# Patient Record
Sex: Male | Born: 1945
Health system: Southern US, Community
[De-identification: ages and names within clinical notes are randomized; demographics above are authoritative.]

## PROBLEM LIST (undated history)

## (undated) DIAGNOSIS — I6529 Occlusion and stenosis of unspecified carotid artery: Secondary | ICD-10-CM

## (undated) DIAGNOSIS — I714 Abdominal aortic aneurysm, without rupture, unspecified: Secondary | ICD-10-CM

## (undated) DIAGNOSIS — I724 Aneurysm of artery of lower extremity: Secondary | ICD-10-CM

## (undated) DIAGNOSIS — I10 Essential (primary) hypertension: Secondary | ICD-10-CM

## (undated) DIAGNOSIS — C801 Malignant (primary) neoplasm, unspecified: Secondary | ICD-10-CM

## (undated) DIAGNOSIS — N189 Chronic kidney disease, unspecified: Secondary | ICD-10-CM

## (undated) DIAGNOSIS — I2510 Atherosclerotic heart disease of native coronary artery without angina pectoris: Secondary | ICD-10-CM

## (undated) DIAGNOSIS — I219 Acute myocardial infarction, unspecified: Secondary | ICD-10-CM

## (undated) DIAGNOSIS — H3320 Serous retinal detachment, unspecified eye: Secondary | ICD-10-CM

## (undated) DIAGNOSIS — E785 Hyperlipidemia, unspecified: Secondary | ICD-10-CM

## (undated) HISTORY — PX: EYE SURGERY: SHX253

## (undated) HISTORY — PX: OTHER SURGICAL HISTORY: SHX169

## (undated) HISTORY — DX: Essential (primary) hypertension: I10

## (undated) HISTORY — DX: Abdominal aortic aneurysm, without rupture, unspecified: I71.40

## (undated) HISTORY — PX: COLONOSCOPY: SHX174

## (undated) HISTORY — DX: Aneurysm of artery of lower extremity: I72.4

## (undated) HISTORY — DX: Hyperlipidemia, unspecified: E78.5

## (undated) HISTORY — DX: Atherosclerotic heart disease of native coronary artery without angina pectoris: I25.10

## (undated) HISTORY — DX: Abdominal aortic aneurysm, without rupture: I71.4

## (undated) HISTORY — PX: CAROTID ENDARTERECTOMY: SUR193

## (undated) HISTORY — DX: Occlusion and stenosis of unspecified carotid artery: I65.29

## (undated) HISTORY — DX: Serous retinal detachment, unspecified eye: H33.20

---

## 2005-09-20 ENCOUNTER — Emergency Department: Payer: Self-pay | Admitting: Emergency Medicine

## 2005-09-20 ENCOUNTER — Other Ambulatory Visit: Payer: Self-pay

## 2005-10-02 ENCOUNTER — Ambulatory Visit: Payer: Self-pay | Admitting: Cardiovascular Disease

## 2005-11-23 ENCOUNTER — Ambulatory Visit: Payer: Self-pay | Admitting: Cardiovascular Disease

## 2005-11-28 ENCOUNTER — Ambulatory Visit: Payer: Self-pay | Admitting: Cardiovascular Disease

## 2006-02-15 ENCOUNTER — Ambulatory Visit: Payer: Self-pay | Admitting: General Surgery

## 2006-11-13 ENCOUNTER — Ambulatory Visit: Payer: Self-pay | Admitting: Cardiovascular Disease

## 2008-01-29 ENCOUNTER — Ambulatory Visit: Payer: Self-pay | Admitting: Cardiovascular Disease

## 2008-07-28 ENCOUNTER — Telehealth: Payer: Self-pay | Admitting: Cardiovascular Disease

## 2009-01-25 ENCOUNTER — Ambulatory Visit: Payer: Self-pay | Admitting: Ophthalmology

## 2009-01-25 ENCOUNTER — Ambulatory Visit: Payer: Self-pay | Admitting: Cardiology

## 2009-01-26 ENCOUNTER — Ambulatory Visit: Payer: Self-pay | Admitting: Ophthalmology

## 2009-02-12 HISTORY — PX: RETINAL DETACHMENT SURGERY: SHX105

## 2009-02-25 ENCOUNTER — Ambulatory Visit: Payer: Medicare Other | Admitting: Ophthalmology

## 2009-07-07 ENCOUNTER — Encounter: Payer: Self-pay | Admitting: Cardiovascular Disease

## 2009-10-11 ENCOUNTER — Ambulatory Visit: Payer: Self-pay | Admitting: Cardiovascular Disease

## 2009-10-11 DIAGNOSIS — R0609 Other forms of dyspnea: Secondary | ICD-10-CM | POA: Insufficient documentation

## 2009-10-11 DIAGNOSIS — I2510 Atherosclerotic heart disease of native coronary artery without angina pectoris: Secondary | ICD-10-CM | POA: Insufficient documentation

## 2009-10-11 DIAGNOSIS — E785 Hyperlipidemia, unspecified: Secondary | ICD-10-CM | POA: Insufficient documentation

## 2009-10-11 DIAGNOSIS — R0989 Other specified symptoms and signs involving the circulatory and respiratory systems: Secondary | ICD-10-CM | POA: Insufficient documentation

## 2009-11-02 ENCOUNTER — Ambulatory Visit: Payer: Self-pay

## 2009-11-02 ENCOUNTER — Ambulatory Visit (HOSPITAL_COMMUNITY)
Admission: RE | Admit: 2009-11-02 | Discharge: 2009-11-02 | Payer: Self-pay | Source: Home / Self Care | Admitting: Cardiovascular Disease

## 2009-11-02 ENCOUNTER — Ambulatory Visit: Payer: Self-pay | Admitting: Cardiology

## 2009-11-02 ENCOUNTER — Encounter: Payer: Self-pay | Admitting: Cardiovascular Disease

## 2009-11-04 ENCOUNTER — Encounter: Admission: RE | Admit: 2009-11-04 | Discharge: 2009-11-04 | Payer: Self-pay | Admitting: Surgery

## 2009-11-04 ENCOUNTER — Ambulatory Visit: Payer: Self-pay | Admitting: Surgery

## 2009-11-04 ENCOUNTER — Encounter: Payer: Self-pay | Admitting: Cardiovascular Disease

## 2009-11-07 ENCOUNTER — Ambulatory Visit: Payer: Self-pay | Admitting: Surgery

## 2009-11-07 ENCOUNTER — Encounter: Payer: Self-pay | Admitting: Cardiovascular Disease

## 2009-11-12 HISTORY — PX: POPLITEAL ARTERY STENT: SHX2243

## 2009-11-15 ENCOUNTER — Telehealth: Payer: Self-pay | Admitting: Cardiovascular Disease

## 2009-11-22 ENCOUNTER — Ambulatory Visit (HOSPITAL_COMMUNITY)
Admission: RE | Admit: 2009-11-22 | Discharge: 2009-11-22 | Payer: Self-pay | Source: Home / Self Care | Admitting: Surgery

## 2009-11-22 ENCOUNTER — Ambulatory Visit: Payer: Self-pay | Admitting: Surgery

## 2009-11-24 ENCOUNTER — Inpatient Hospital Stay (HOSPITAL_COMMUNITY)
Admission: RE | Admit: 2009-11-24 | Discharge: 2009-11-25 | Payer: Self-pay | Source: Home / Self Care | Admitting: Surgery

## 2009-11-24 HISTORY — PX: ABDOMINAL AORTIC ANEURYSM REPAIR: SUR1152

## 2009-12-19 ENCOUNTER — Encounter: Admission: RE | Admit: 2009-12-19 | Discharge: 2009-12-19 | Payer: Self-pay | Admitting: Surgery

## 2009-12-19 ENCOUNTER — Encounter: Payer: Self-pay | Admitting: Cardiovascular Disease

## 2009-12-19 ENCOUNTER — Ambulatory Visit: Payer: Self-pay | Admitting: Surgery

## 2010-03-04 ENCOUNTER — Other Ambulatory Visit: Payer: Self-pay | Admitting: Surgery

## 2010-03-04 DIAGNOSIS — I714 Abdominal aortic aneurysm, without rupture, unspecified: Secondary | ICD-10-CM

## 2010-03-14 NOTE — Progress Notes (Signed)
Summary: echo results  Phone Note Call from Patient Call back at Home Phone [phone]   Caller: Patient Summary of Call: Pt returning call Initial call taken by: Judie Grieve,  November 15, 2009 9:53 AM  Follow-up for Phone Call        adv pt of echo results. Follow-up by: Claris Gladden RN,  November 15, 2009 10:04 AM

## 2010-03-14 NOTE — Letter (Signed)
Summary: [facility] Visit   [facility] Visit   Imported By: Roderic Ovens 11/28/2009 17:19:09  _____________________________________________________________________  External Attachment:    Type:   Image     Comment:   External Document

## 2010-03-14 NOTE — Assessment & Plan Note (Signed)
Summary: follow up   Visit Type:  Follow-up  CC:  Sob.  History of Present Illness:  Mr. [name] [name] is a 65 year old gentleman with coronary artery disease, who had an inferior MI in August 2007.  He was treated with bare-metal stenting of the right coronary artery at [facility].  He had no other obstructive CAD at that time.  He has done well since his initial event.    About 2-3 weeks ago he experienced dyspnea with walking that lasted about 2 days.  At that time he denies chest pain, dyspnea, edema, orthopnea, PND, palpitations, or lightheadedness. He denies dyspnea since that one episode.  He also denies chest pain, dyspnea, edema, orthopnea, PND, palpitations, or lightheadedness.  Exercise: walking, 1 mile daily, 7x/week Tobacco: quit >30 yrs, smoked 1 ppdx 15 yrs Weight: stable   New Med dose: Simvastatin 80mg  for about 1 yr.  Prior to that he was taking Simva 40, and Lipitor prior to that.    He has had elevated Triglycerides in the 1000s prior to taking Lovaza.  Trig is now 164.      Current Medications (verified): 1)  Simvastatin 80 Mg Tabs (Simvastatin) .... Take One Tablet By Mouth Daily At Bedtime 2)  Lisinopril 20 Mg Tabs (Lisinopril) .... Take One Tablet By Mouth Daily 3)  Lovaza 1 Gm Caps (Omega-3-Acid Ethyl Esters) .... Take 1 Capsule By Mouth Once A Day 4)  Metoprolol Tartrate 25 Mg Tabs (Metoprolol Tartrate) .... Take One Tablet By Mouth Twice A Day 5)  Niacin 500 Mg Tabs (Niacin) .... Take 2 Tablets Two Times A Day 6)  Saw Palmetto 450 Mg Caps (Saw Palmetto (Serenoa Repens)) .... Take 2 Caps Two Times A Day 7)  Aspirin 81 Mg Tbec (Aspirin) .... Take One Tablet By Mouth Daily  Allergies (verified): 1)  ! Terramycin  Past History:  Past medical history reviewed for relevance to current acute and chronic problems.  Past Medical History: 1. Coronary artery disease s/p inferior MI 2007 2. Hypertension 3. Hyperlipidemia  Review of Systems       Negative  except as per HPI   Vital Signs:  Patient profile:   65 year old male Height:      74 inches Weight:      207.75 pounds BMI:     26.77 Pulse rate:   65 / minute Pulse rhythm:   regular Resp:     18 per minute BP sitting:   100 / 70  (left arm) Cuff size:   large  Vitals Entered By: Vikki Ports (October 11, 2009 9:58 AM)  Physical Exam  General:  Pt is alert and oriented, in no acute distress. HEENT: normal Neck: normal carotid upstrokes without bruits, JVP normal Lungs: CTA CV: RRR without murmur or gallop Abd: soft, NT, positive BS, no bruit, no organomegaly Ext: no clubbing, cyanosis, or edema. peripheral pulses 2+ and equal Skin: warm and dry without rash    EKG  Procedure date:  10/11/2009  Findings:      NSR 66 bpm with age-indeterminate inferior infarct.  Impression & Recommendations:  Problem # 1:  CORONARY ATHEROSCLEROSIS NATIVE CORONARY ARTERY (ICD-414.01) Pt is stable without angina. He is on a good secondary risk-reduction program. With recent dyspnea on exertion, recommend evaluate with an exercise stress echo to rule out significant ischemia. Otherwise reviewed details of an exercise program and importance of continued medication adherence.  His updated medication list for this problem includes:    Lisinopril 20 Mg Tabs (  Lisinopril) .Marland Kitchen... Take one tablet by mouth daily    Metoprolol Tartrate 25 Mg Tabs (Metoprolol tartrate) .Marland Kitchen... Take one tablet by mouth twice a day    Aspirin 81 Mg Tbec (Aspirin) .Marland Kitchen... Take one tablet by mouth daily  Orders: EKG w/ Interpretation (93000) Abdominal Aorta Duplex (Abd Aorta Duplex) Stress Echo (Stress Echo)  Problem # 2:  ABDOMINAL BRUIT (ICD-785.9) In patient with athersclerotic CAD, recommend aortic duplex ultrasound to rule out AAA.  Orders: EKG w/ Interpretation (93000) Abdominal Aorta Duplex (Abd Aorta Duplex) Stress Echo (Stress Echo)  Problem # 3:  HYPERLIPIDEMIA-MIXED (ICD-272.4) Lipids reviewed: Chol  120, Trig 164, HDL 28, LDL 59.  HDL has not improved on Niacin. Pt will reduce his niacin dose by 50%.  His updated medication list for this problem includes:    Simvastatin 80 Mg Tabs (Simvastatin) .Marland Kitchen... Take one tablet by mouth daily at bedtime    Lovaza 1 Gm Caps (Omega-3-acid ethyl esters) .Marland Kitchen... Take 1 capsule by mouth once a day    Niacin 500 Mg Tabs (Niacin) .Marland Kitchen... Take one tablet by mouth two times a day  Patient Instructions: 1)  Your physician has recommended you make the following change in your medication: DECREASE Niacin to one tablet by mouth two times a day  2)  Your physician wants you to follow-up in: 1 YEAR.  You will receive a reminder letter in the mail two months in advance. If you don't receive a letter, please call our office to schedule the follow-up appointment. 3)  Your physician has requested that you have an abdominal aorta duplex. During this test, an ultrasound is used to evaluate the aorta. Allow 30 minutes for this exam. Do not eat after midnight the day before and avoid carbonated beverages. There are no restrictions or special instructions. 4)  Your physician has requested that you have a stress echocardiogram. For further information please visit [email or web address]  Please follow instruction sheet as given.

## 2010-03-14 NOTE — Progress Notes (Signed)
Summary: [facility] Office Visit  [facility] Office Visit   Imported By: Earl Many 01/03/2010 16:07:09  _____________________________________________________________________  External Attachment:    Type:   Image     Comment:   External Document

## 2010-03-27 ENCOUNTER — Ambulatory Visit
Admission: RE | Admit: 2010-03-27 | Discharge: 2010-03-27 | Disposition: A | Discharge status: Home / Self Care | Payer: Medicare Other | Source: Ambulatory Visit | Attending: Surgery | Admitting: Surgery

## 2010-03-27 ENCOUNTER — Ambulatory Visit (INDEPENDENT_AMBULATORY_CARE_PROVIDER_SITE_OTHER): Payer: Medicare Other | Admitting: Surgery

## 2010-03-27 DIAGNOSIS — I714 Abdominal aortic aneurysm, without rupture: Secondary | ICD-10-CM

## 2010-03-27 MED ORDER — IOHEXOL 350 MG/ML SOLN: 165.0000 mL | Freq: Once | INTRAVENOUS | Status: AC | PRN

## 2010-03-28 NOTE — Assessment & Plan Note (Signed)
OFFICE VISIT  YING, ROCKS DOB:  Jun 19, 1945                                       03/27/2010 [id number]#:[id number]  Patient comes back in today for follow-up.  He is status post endovascular repair of left popliteal aneurysm on 11/23/2009 as well as percutaneous endovascular repair of an abdominal aortic aneurysm on 10/13.  He has had no complications.  He does have a known type 2 endoleak.  He in comes today without complaints.  PHYSICAL EXAMINATION:  Heart rate 62, blood pressure 123/69, O2 sats 97%, respiratory rate 12.  General:  He is well-appearing, no distress. Abdomen:  Soft, nontender.  No pulsatile mass.  Respirations nonlabored. Extremities are warm and well-perfused.  Pedal pulses are intact.  DIAGNOSTIC STUDIES:  CT angiogram was performed today.  It shows no change in his aneurysm sac, but persistent type 2 endoleak from a patent lumbar.  Left popliteal stent is widely patent with mild in-stent stenosis.  ASSESSMENT:  Status post endovascular aneurysm and left popliteal aneurysm repair.  PLAN:  We discussed at length the significance of the type 2 endoleak. I have scheduled him to come back to see me in 6 months.  He knows that he is still at risk for abdominal aneurysm rupture, although I think this is unlikely.  We will make recommendations for intervention if the aneurysm increases in size.  Hopefully this will resolve on its own.  He will get a CT scan in 6 months as well as an ultrasound evaluation of his legs.    Jorge Ny, MD Electronically Signed  VWB/MEDQ  D:  03/27/2010  T:  03/28/2010  Job:  [id number]  cc:   Veverly Fells. Excell Seltzer, MD

## 2010-04-27 LAB — PROTIME-INR
INR: 1.08 (ref 0.00–1.49)
INR: 1.22 (ref 0.00–1.49)
Prothrombin Time: 14.2 seconds (ref 11.6–15.2)

## 2010-04-27 LAB — COMPREHENSIVE METABOLIC PANEL
AST: 17 U/L (ref 0–37)
Albumin: 3.3 g/dL — ABNORMAL LOW (ref 3.5–5.2)
BUN: 11 mg/dL (ref 6–23)
Calcium: 8.9 mg/dL (ref 8.4–10.5)
Calcium: 9.1 mg/dL (ref 8.4–10.5)
Creatinine, Ser: 1.09 mg/dL (ref 0.4–1.5)
GFR calc Af Amer: >60 mL/min (ref >60)
Glucose, Bld: 129 mg/dL — ABNORMAL HIGH (ref 70–99)
Sodium: 138 mEq/L (ref 135–145)
Total Protein: 6.2 g/dL (ref 6.0–8.3)
Total Protein: 6.2 g/dL (ref 6.0–8.3)

## 2010-04-27 LAB — CBC
HCT: 36.4 % — ABNORMAL LOW (ref 39.0–52.0)
HCT: 41.4 % (ref 39.0–52.0)
Hemoglobin: 12.4 g/dL — ABNORMAL LOW (ref 13.0–17.0)
MCH: 31.1 pg (ref 26.0–34.0)
MCH: 31.7 pg (ref 26.0–34.0)
MCHC: 33.9 g/dL (ref 30.0–36.0)
MCHC: 34.1 g/dL (ref 30.0–36.0)
MCHC: 34.3 g/dL (ref 30.0–36.0)
MCV: 92.4 fL (ref 78.0–100.0)
Platelets: 179 10*3/uL (ref 150–400)
Platelets: 196 10*3/uL (ref 150–400)
RBC: 4.04 MIL/uL — ABNORMAL LOW (ref 4.22–5.81)
RDW: 12.4 % (ref 11.5–15.5)
RDW: 12.5 % (ref 11.5–15.5)

## 2010-04-27 LAB — POCT I-STAT, CHEM 8
BUN: 13 mg/dL (ref 6–23)
Calcium, Ion: 1.2 mmol/L (ref 1.12–1.32)
Hemoglobin: 15 g/dL (ref 13.0–17.0)
Sodium: 143 mEq/L (ref 135–145)
TCO2: 23 mmol/L (ref 0–100)

## 2010-04-27 LAB — BLOOD GAS, ARTERIAL
Bicarbonate: 22.7 mEq/L (ref 20.0–24.0)
Drawn by: 206361
Drawn by: 22251
Patient temperature: 98.6
Patient temperature: 98.6
TCO2: 24.5 mmol/L (ref 0–100)
pH, Arterial: 7.342 — ABNORMAL LOW (ref 7.350–7.450)
pH, Arterial: 7.401 (ref 7.350–7.450)

## 2010-04-27 LAB — TYPE AND SCREEN
ABO/RH(D): O NEG
Antibody Screen: NEGATIVE

## 2010-04-27 LAB — URINALYSIS, ROUTINE W REFLEX MICROSCOPIC
Bilirubin Urine: NEGATIVE
Glucose, UA: NEGATIVE mg/dL
Hgb urine dipstick: NEGATIVE
Specific Gravity, Urine: 1.015 (ref 1.005–1.030)
pH: 6 (ref 5.0–8.0)

## 2010-04-27 LAB — BASIC METABOLIC PANEL
CO2: 25 mEq/L (ref 19–32)
GFR calc non Af Amer: >60 mL/min (ref >60)
Glucose, Bld: 101 mg/dL — ABNORMAL HIGH (ref 70–99)
Potassium: 3.9 mEq/L (ref 3.5–5.1)
Sodium: 141 mEq/L (ref 135–145)

## 2010-06-26 ENCOUNTER — Ambulatory Visit: Payer: Medicare Other | Admitting: Surgery

## 2010-06-26 ENCOUNTER — Ambulatory Visit: Payer: Self-pay | Admitting: Surgery

## 2010-06-27 NOTE — Procedures (Signed)
CAROTID DUPLEX EXAM   INDICATION:  The patient is referred for evaluation prior to planned  surgery for 6.5 cm abdominal aortic aneurysm found on recent ultrasound.   HISTORY:  Diabetes:  No.  Cardiac:  Stent in 2007.  Hypertension:  Yes.  Smoking:  No.  Previous Surgery:  No.  CV History:  Amaurosis Fugax No, Paresthesias No, Hemiparesis No                                       RIGHT             LEFT  Brachial systolic pressure:  Brachial Doppler waveforms:  Vertebral direction of flow:        Antegrade         Antegrade  DUPLEX VELOCITIES (cm/sec)  CCA peak systolic                   121               121  ECA peak systolic                   86                91  ICA peak systolic                   76                89  ICA end diastolic                   31                47  PLAQUE MORPHOLOGY:                  Homogeneous       Homogeneous  PLAQUE AMOUNT:                      Minimal           Mild  PLAQUE LOCATION:                    Bifurcation       Bifurcation   IMPRESSION:  No evidence of hemodynamically significant internal carotid  artery stenosis bilaterally, <40%.    ___________________________________________  V. Charlena Cross, MD   RS/MEDQ  D:  11/04/2009  T:  11/05/2009  Job:  [id number]

## 2010-06-27 NOTE — Assessment & Plan Note (Signed)
OFFICE VISIT   ZAKAREE, MCCLENAHAN E  DOB:  [birth year]                                       11/07/2009  EAVWU#:[id number]   REASON FOR VISIT:  Abdominal and popliteal aneurysm.   HISTORY:  The patient is a 65 year old gentleman that I am seeing at the  request of Dr. Excell Seltzer for aneurysms within his abdominal aorta and left  and right popliteal arteries.  These were just discovered within the  last week or 2 by Dr. Excell Seltzer and was referred to me for further  evaluation.  The patient does have a history of coronary artery disease.  He had a heart attack in 2007 where he underwent bare-metal stenting at  [facility].  He is medically managed for his hypertension and  hypercholesterolemia.   The patient denies having any abdominal pain or claudication-like  symptoms.  He denies chest pain or shortness of breath.   REVIEW OF SYSTEMS:  Negative except as detailed in the HPI.   PAST MEDICAL HISTORY:  Hypertension, hypercholesterolemia, coronary  artery disease, detached retina.   PAST SURGICAL HISTORY:  Coronary stenting in 2007.   SOCIAL HISTORY:  He has a history of smoking but quit 25 years ago.  Does not drink.   FAMILY HISTORY:  Positive for peripheral vascular disease in his brother  and possible ruptured aneurysm in his sister.   MEDICATIONS:  Simvastatin, lisinopril, Lovaza, metoprolol, niacin, saw  palmetto oil and aspirin.   ALLERGIES:  Terramycin.   PHYSICAL EXAMINATION:  Heart rate 63, blood pressure 93/59, O2  saturation is 99%.  Generally, he is well-appearing, in no distress.  HEENT:  Within normal limits.  Lungs are clear bilaterally.  No wheezes  or rhonchi.  Cardiovascular:  Regular rate and rhythm.  No murmur.  No  carotid bruits.  Pedal pulses are not palpable.  He has palpable  popliteal pulse and bulge bilaterally, left greater than right.  Neurologically he is intact.  Skin is without rash.   DIAGNOSTIC STUDIES:  I have  ordered and reviewed his carotid ultrasound,  which showed minimal carotid stenosis bilaterally.  I have also reviewed  his CT angiogram, which shows a 2.2-cm left popliteal aneurysm and a 6.5-  cm infrarenal abdominal aortic aneurysm and a 1.5-cm right popliteal  aneurysm.   ASSESSMENT/PLAN:  I spent approximately 45 minutes discussing findings  with the patient.  I have recommended repair.  We discussed open versus  endovascular repair and we have elected to proceed with a minimally-  invasive approach.  I discussed that treating his popliteal aneurysm  endovascularly would be best done prior to his abdominal procedure.  Therefore, we will plan on Tuesday, October 11, proceeding with  abdominal aortogram with bilateral runoff, evaluation of his right  popliteal aneurysm and treatment of his left popliteal aneurysm  __________ with a __________ endoprosthesis.  The patient understands  that based on the radiographic findings he may not be a candidate for  endovascular repair but based on CT findings, he will be.   We discussed proceeding with endovascular repair of his aneurysm on  October 13.  We discussed all potential complications including risk of  bleeding, renal complications, lower extremity complications, intestinal  complications.  All of the patient's questions were answered.  We are  delaying his operation for 2 weeks because he is supposed to  have laser  surgery on October 6.     Jorge Ny, MD  Electronically Signed   VWB/MEDQ  D:  11/07/2009  T:  11/08/2009  Job:  [id number]   cc:   Veverly Fells. Excell Seltzer, MD

## 2010-06-27 NOTE — Assessment & Plan Note (Signed)
[name]                            CARDIOLOGY OFFICE NOTE   NAME:[name], [name]                   MRN:          [id number]  DATE:01/29/2008                            DOB:          29-Jan-1946    REASON FOR VISIT:  Followup CAD.   HISTORY OF PRESENT ILLNESS:  [name] [name] is a 65 year old gentleman  with coronary artery disease, who had an inferior MI in August 2007.  He  was treated with bare-metal stenting of the right coronary artery at  [name].  He had no other obstructive CAD at that time.  He has  done well since his initial event.  He has previously been more active,  but has been doing less exercise recently.  He does some walking and has  no symptoms with that level of activity.  He specifically denies chest  pain, dyspnea, edema, orthopnea, PND, palpitations, or lightheadedness.  He is changed from long-acting Niaspan to no-flush niacin and has had no  problems with that medicine change.  His other medications are stable.   MEDICATIONS:  1. Toprol-XL 100 mg daily.  2. Lipitor 40 mg daily.  3. Lisinopril 20 mg daily.  4. Aspirin 81 mg daily.  5. Lovaza 1 g daily.  6. Omega-3 four daily.  7. Niacin 500 mg 2 b.i.d.   ALLERGIES:  NKDA.   PHYSICAL EXAMINATION:  GENERAL:  The patient is alert and oriented.  No  acute distress.  VITAL SIGNS:  Weight is 220 pounds, blood pressure 124/76, heart rate  64, and respiratory rate 12.  HEENT:  Normal.  NECK:  Normal carotid upstrokes.  No bruits.  JVP normal.  LUNGS:  Clear bilaterally.  HEART:  Regular rate and rhythm.  No murmurs or gallops.  ABDOMEN:  Soft and nontender.  No organomegaly.  No bruits.  EXTREMITIES:  No clubbing, cyanosis, or edema.  Peripheral pulses intact  and equal.   EKG was done at [name], dated on October 09, 2007.  This showed marked sinus bradycardia with age indeterminate inferior MI.  Otherwise, within normal limits.  The heart  rate at that time was 48  beats per minute.   ASSESSMENT:  1. Coronary artery disease, status post inferior myocardial      infarction.  [name] [name] is doing well with no symptoms.  I      encouraged him to try to increase his exercise program with a goal      toward 30-40 minutes daily.  He, otherwise, will continue on his      current medicines without changes.  He is having no angina.  2. Hypertension.  Blood pressure under good control on his current      regimen.  His heart rate is improved today and he has no symptoms      of dizziness or lightheadedness.  He can continue on his current      beta-blocker dose.  3. Dyslipidemia.  He was managed by [name].  He has treated      aggressively on combination therapy with Lipitor, Lovaza, and  niacin.  Goal LDL is less than 70.   For followup, I would like to see [name] [name] back in 1 year.  I will  be happy to see him sooner if any problems arise.     Veverly Fells. Excell Seltzer, MD  Electronically Signed    MDC/MedQ  DD: 01/30/2008  DT: 01/30/2008  Job #: [id number]   cc:   [name] Kern, PA

## 2010-06-27 NOTE — Assessment & Plan Note (Signed)
[facility]                            CARDIOLOGY OFFICE NOTE   NAME:[name] [name], [name] [name]                   MRN:          [id number]  DATE:11/13/2006                            DOB:          [birth year]    [name] [name] was seen in followup at the [facility] cardiology office  on November 13, 2006.  He is a 65 year old gentleman who had an inferior  MI in August of 2007.  He was treated with percutaneous coronary  intervention and a bare metal stent was placed in his right coronary  artery at that time.  He did not have any other significant obstructive  CAD.  Mr. Vantrease has done well since his event.  He is walking one  mile daily and has no symptoms at that level of activity.  He is not  involved in any vigorous exercise.  He specifically denies chest pain,  dyspnea, orthopnea, PND, edema or palpitations.   He is tolerating his medications well.  He has been on Niaspan at a dose  of 500 mg and is having minimal flushing.   CURRENT MEDICATIONS:  1. Plavix 75 mg daily  2. Toprol XL 100 mg daily  3. Lipitor 40 mg daily  4. Lisinopril 20 mg daily  5. Aspirin 81 mg daily  6. Niaspan 500 mg daily  7. Lovaza 1 gram daily  8. Omega 3 four daily.   ALLERGIES:  NO KNOWN DRUG ALLERGIES.   PHYSICAL EXAMINATION:  On exam, he is alert and oriented, in no acute  distress.  Blood pressure is 104/66, heart rate 56, respiratory rate 12,  weight is 218 pounds.  HEENT:  Normal.  NECK:  Normal carotid upstrokes without bruits.  Jugular venous pressure  is normal.  LUNGS:  Clear to auscultation bilaterally.  HEART:  Regular rate and rhythm without murmurs or gallops.  ABDOMEN:  Soft, nontender, no organomegaly, no abdominal bruits.  EXTREMITIES:  No cyanosis, clubbing or edema.  Peripheral pulses 2+ and  equal throughout.   EKG shows sinus bradycardia with a ventricular rate of 56 beats per  minute.  There is left axis deviation and an age-indeterminate  inferior  MI pattern.   Lipids drawn October 24, 2006 showed a cholesterol of 117, with an HDL  of 26, triglycerides  301, and an LDL of 31.  His glucose was 120 and  creatinine was 1.1.   ASSESSMENT:  1. Coronary artery disease status post inferior MI.  He had preserved      LVEF at the time of 58%.  He did not have any other significant CAD      noted.  Mr. Adcock is tolerating secondary risk reduction      medications well.  I advised that he could discontinue Plavix as he      had a bare metal stent placed for treatment and he is greater than      1 year out from his acute event.  Otherwise I did not make any      changes to his medical regimen today, except an increase in Niaspan      (  see discussion below).  I encouraged him to continue with his      regular walking activity and to try to increase this as tolerated.  2. Dyslipidemia.  He has a lipid profile consistent with metabolic      syndrome.  He has high triglycerides, low HDL and impaired fasting      glucose.  We discussed a carbohydrate restricted diet.  I would      like him to increase his Niaspan from 500 to 1000 mg daily if he      tolerates.  He takes his Niaspan at night and I advised him to take      his aspirin 30 minutes prior to Niaspan to try to minimize      flushing.  He should continue on Lipitor 40 mg.  Unfortunately, Mr.      Benney has been laid off from work and medication cost may      become an issue.  If necessary he could be changed to Simvastatin      for more reasonable cost.  3. Followup.  I would like to see Mr. Hynes back in 6 months or      sooner if any new problems arise.     Veverly Fells. Excell Seltzer, MD  Electronically Signed    MDC/MedQ  DD: 11/13/2006  DT: 11/13/2006  Job #: [id number]   cc:   Practice, [place], [place] [address] Anola Gurney, [facility]

## 2010-06-27 NOTE — Assessment & Plan Note (Signed)
OFFICE VISIT   JHAN, CONERY E  DOB:  [birth year]                                       12/19/2009  ZOXWR#:[id number]   REASON FOR VISIT:  Followup.   The patient comes back in today for followup.  He is status post  endovascular repair of left popliteal aneurysm on 11/23/2009 and  endovascular repair percutaneously of abdominal aortic aneurysm on  10/13.  He has had no complaints.  He does have a small rash which has  been attributed to his contrast.  He has been doing well at home.   PHYSICAL EXAMINATION:  He is afebrile.  His abdomen is soft.  Incision  sites are well-healed.  Legs are warm and well-perfused.   DIAGNOSTIC STUDIES:  Ultrasound was reviewed today.  He has ABI of 1.0  on the left and 0.87 on the right.   I have reviewed his CT scan.  He has a small type 2 endo leak from a  patent lumbar artery with no significant change in his aneurysm sac.   ASSESSMENT AND PLAN:  1. Popliteal aneurysm:  The left side has been successfully excluded      with a Viabahn endoprosthesis.  The right side measured 1.6 cm by      CT scan.  I will continue to follow this with serial imaging      studies.  2. Abdominal aortic aneurysm:  Based on this type 2 endo leak I will      repeat a CT scan in 3 months.  I discussed with the patient that      should his aneurysm sac start to enlarge this would need to be      addressed.  However, more than likely this will spontaneously      thrombose.   I have scheduled the patient to have a CT angiogram with bilateral lower  extremity runoff to evaluate his aneurysm and type 2 endo leak as well  as his popliteal aneurysms.  He will need to be pretreated for his  contrast allergy.     Jorge Ny, MD  Electronically Signed   VWB/MEDQ  D:  12/19/2009  T:  12/19/2009  Job:  [id number]   cc:   Veverly Fells. Excell Seltzer, MD

## 2010-06-30 NOTE — Assessment & Plan Note (Signed)
[facility]                              CARDIOLOGY OFFICE NOTE   NAME:[name], [name] BIALY                   MRN:          [id number]  DATE:11/28/2005                            DOB:          September 25, 1945    [name] [name] was seen this morning at the [facility] to  follow up his coronary artery disease and acute MI that occurred in August  of this year.  He was treated with stenting of an occluded right coronary  artery at that time.  He had no other obstructive disease seen at the time  of his catheterization.  He had a preserved left ventricular ejection  fraction, which was estimated at 58%.   [name] [name] continues to do well.  He is asymptomatic.  He is back to work  and feels to be back at his baseline health.  He specifically denies chest  pain, dyspnea, orthopnea, PND, edema, lightheadedness, syncope, or  palpitations.  He is tolerating his medications well with the only interim  change being the addition of fish oil approximately one week ago.   CURRENT MEDICATIONS:  1. Plavix 75 mg daily.  2. Toprol XL 100 mg daily.  3. Lipitor 40 mg daily.  4. Lisinopril 20 mg daily.  5. Aspirin 81 mg daily.  6. Fish oil, uncertain dose.   PHYSICAL EXAMINATION:  GENERAL:  Patient is alert and oriented in no acute  distress.  VITAL SIGNS:  Weight 217 pounds.  Blood pressure 118/66, heart rate 53,  respiratory rate 12.  NECK:  Normal carotid upstrokes without bruits.  Jugular venous pressure is  normal.  LUNGS:  Clear to auscultation bilaterally.  CARDIOVASCULAR:  Bradycardic and regular.  No murmurs or gallops.  ABDOMEN:  Soft and nontender.  No organomegaly.  EXTREMITIES:  No clubbing, cyanosis or edema.  Peripheral pulses are 2+ and  equal throughout.   Recent lipid panel from October 12th shows a total cholesterol of 102,  triglycerides 143, HDL 22, and LDL of 52.  His LFTs were normal.   [name] [name] is currently stable  from a cardiovascular standpoint in regard  to his coronary artery disease and recent inferior wall MI.  His current  cardiac issues are as follows:  1)  Coronary artery disease:  He will  continue on dual antiplatelet therapy with aspirin and Plavix for one year.  He had a bare metal stent in his right coronary artery but after the acute  MI, I would prefer that he take one year of Plavix.  He should be on aspirin  lifelong.  He has had no angina on his current regimen of Toprol and  lisinopril.  2)  Hypertension:  Blood pressure is well controlled on current  medications.  3)  Dyslipidemia:  In comparing his lipid panel to his  previous panel from September 24th, his LDL cholesterol is excellent.  His  triglycerides are much improved as well; however, his HDL is quite low.  I  have asked that he add Niaspan 500 mg daily to his medical regimen.  He will  be on triple therapy  with Lipitor, fish oil, and Niaspan.  He was warned  about flushing, and I advised him to take his aspirin dose 30 minutes prior  to his Niaspan.  I have asked him to take 500 mg daily x2 weeks, then  increase to 1 gm daily thereafter.  We will plan on rechecking his lipids  and LFTs in three months, which he prefers to do at the [facility] office.  I will plan on seeing him back in one year for followup or  sooner if any new issues arise.       Veverly Fells. Excell Seltzer, MD     MDC/MedQ  DD:  11/28/2005  DT:  11/29/2005  Job #:  [id number]   cc:   Toni Arthurs PA

## 2010-06-30 NOTE — Assessment & Plan Note (Signed)
[facility]                              CARDIOLOGY OFFICE NOTE   NAME:[name] [name], [name] [name]                   MRN:          [id number]  DATE:10/02/2005                            DOB:          02/16/1945    PATIENT IDENTIFICATION:  [name] [name] is a 65 year old man who is married  with 1 child.  He lives in [place].   CHIEF COMPLAINT:  The patient is here for followup of recent acute MI.   HISTORY OF PRESENT ILLNESS:  [name] [name] is a very pleasant 65 year old  man who presents after recent acute inferolateral myocardial infarction.  He  reports that he was in his usual state of health on September 19, 2005, when he  was working on his car.  During that activity, he developed acute onset of  nausea and substernal chest discomfort.  He went inside his home to rest and  his symptoms persisted throughout the remainder of the night.  The next  morning, he continued to have some stuttering chest pain and he called his  family practitioner at that time.  He was advised to call EMS, which he did.  He was taken to [facility] where he was initially  treated with medications since he was pain free and presented very late into  his infarct.  He was taken to the cardiac catheterization lab electively on  September 21, 2005, and was found to have an occluded right coronary artery  which was intervened upon and a bare metal stent was placed in this vessel  with what appears to be a good angiographic result based on pictures that  the patient brings in with him today.   [name] [name] had been fairly active prior to this episode as he had  recently helped his nephew move and did some other heavy work.  He was  asymptomatic during those activities and had really not had any chest  discomfort prior to his myocardial infarction.  Since hospital discharge, he  has also done well.  He specifically denies chest pain, dyspnea, orthopnea,  PND,  lightheadedness, syncope, palpitations, claudication symptoms, or other  cardiovascular complaints.  He has been compliant with his medications and  has not had any adverse effects up to this point.  He does not exercise  regularly, and he has been only sporadically active over the years.  It has  been recommended that he enroll in cardiac rehab but he has not enrolled up  to this point.   MEDICATIONS:  1. Plavix 75 mg daily.  2. Toprol XL 100 mg daily.  3. Atorvastatin 40 mg daily.  4. Lisinopril 20 mg daily.  5. Aspirin 81 mg daily.   ALLERGIES:  TERRAMYCIN.   SOCIAL HISTORY:  The patient lives with his wife in [place].  His grown  son also lives with them periodically.  He works for a [employer], where they work on [employer] that go into [employer].  He reports that his job is not particularly physically demanding.   HABITS:  He does not smoke cigarettes and only rarely  consumes alcohol.  He  does not use any recreational drugs.  He does drink a moderate amount of  caffeine.   FAMILY HISTORY:  The patient's mother died at 69 from complications of  diabetes.  His father died at age 38 in a tractor accident.  He has a sister  who is alive and well and has another sister who is deceased from colon  cancer.  He has 7 brothers, only one of whom has coronary artery disease.   REVIEW OF SYSTEMS:  A complete 12-point review of systems was performed and  is negative except as described in the history of present illness.  The only  other pertinent positive is that he has urinary hesitancy which is improved  after starting Saw palmetto.  He specifically denies GI symptoms, any other  GU symptoms, bleeding problems, neurologic problems, and again all other  systems were reviewed and negative.   PHYSICAL EXAMINATION:  GENERAL:  He is alert and oriented.  He is in no  acute distress.  He appears well nourished.  VITAL SIGNS:  His weight is 214 pounds.  Height  is 6 feet 2 inches.  Blood  pressure is 118/82 in the right arm, 124/86 on the left arm.  Heart rate is  60.  Respiratory rate is 12.  EYES:  Sclerae are anicteric.  Conjunctivae pink.  ENT:  Reveals moist oral mucosa.  The oropharynx is clear.  There is no  cervical lymphadenopathy.  CARDIOVASCULAR:  The apex is discrete and not displaced.  There is no right  ventricular heave or lift.  Heart is regular rate and rhythm without murmurs  or gallops.  LUNGS:  Clear to auscultation bilaterally.  ABDOMEN:  Soft, nontender.  No organomegaly.  There are no abdominal bruits  present.  Bowel sounds are present.  EXTREMITIES:  The right groin was examined and there is faint ecchymosis  present.  The puncture site appears to be healing well.  There is no  erythema or mass present.  The left femoral pulse is 2+ without a bruit.  There is no clubbing, cyanosis, or edema present in the legs.  Peripheral  pulses are 2+ and equal throughout.  SKIN:  Warm and dry.  There is a faint macular rash on the right lower leg.   DATA REVIEW:  A 12-lead EKG, performed in the office, demonstrates normal  sinus rhythm with a inferolateral infarct pattern that appears recent with  deep T wave inversions still present in the inferior leads.  There is some  laboratory data present from [facility] that demonstrates a hemoglobin of 16, a  platelet count of 224,000, creatinine of 1.0, potassium of 4.1, and a  glucose of 100.  Chest x-ray reading reports a mild elevation of the right  hemidiaphragm, mildly tortuous aorta, and otherwise a normal study.   The catheterization report describes insignificant disease in the left  coronary circulation including the left main LAD and left circumflex.  The  right coronary artery was totally occluded as above.  The left ventricular  ejection fraction was estimated at 58%.  ASSESSMENT/PLAN:  [name] [name] is a 65 year old male with recent acute  inferolateral myocardial infarction.   His current problems are as follows:  1. Coronary artery disease.  The patient is status post bare metal stent      placement in the right coronary artery.  As described he has minimal      disease in the left coronary artery system.  I have  requested his      catheterization films be sent from [facility], so that I can review these.  I      have recommended he continue on dual antiplatelet therapy with aspirin      and clopidogrel for 12 months, following his acute myocardial      infarction.  At that point, we will likely discontinue his clopidogrel      since he has a bare metal stent in place.  I have also written a      referral for phase II and III of cardiac rehabilitation that he      requests to do near his  home, so we have asked for this to be done at      [facility].  He should be able to return to work at any time now      that he is nearly 2 weeks out from his myocardial infarction and it      appears to have been an uncomplicated inferior wall myocardial      infarction.  His work is not physically labor intensive.  2. Dyslipidemia.  The patient was started on Atorvastatin 40 mg daily.  I      have requested that he return for laboratory work in 8 weeks to include      LFTs and a fasting lipid panel.  3. Medical therapy.  He is on appropriate medications following his      myocardial infarction with Lisinopril and Toprol.  His blood pressure      is stable today on these medications and he appears to be tolerating      them well.  We will continue them, and we will check his serum      chemistries since he is on an ACE inhibitor in approximately 8 weeks.   FOLLOWUP:  We will see [name] [name] back in approximately 8 weeks, after  his laboratory work is completed.                                 Micheline Chapman, MD    MDC/MedQ  DD:  10/02/2005  DT:  10/02/2005  Job #:  [id number]

## 2010-09-07 ENCOUNTER — Encounter: Payer: Self-pay | Admitting: Surgery

## 2010-09-22 ENCOUNTER — Encounter: Payer: Self-pay | Admitting: Surgery

## 2010-10-02 ENCOUNTER — Ambulatory Visit: Payer: Medicare Other | Admitting: Surgery

## 2010-10-17 ENCOUNTER — Encounter: Payer: Self-pay | Admitting: Surgery

## 2010-10-26 ENCOUNTER — Encounter: Payer: Self-pay | Admitting: Cardiovascular Disease

## 2010-10-30 ENCOUNTER — Ambulatory Visit (INDEPENDENT_AMBULATORY_CARE_PROVIDER_SITE_OTHER): Payer: Medicare Other | Admitting: Cardiovascular Disease

## 2010-10-30 ENCOUNTER — Encounter: Payer: Self-pay | Admitting: Cardiovascular Disease

## 2010-10-30 DIAGNOSIS — I2510 Atherosclerotic heart disease of native coronary artery without angina pectoris: Secondary | ICD-10-CM

## 2010-10-30 DIAGNOSIS — E7800 Pure hypercholesterolemia, unspecified: Secondary | ICD-10-CM

## 2010-10-30 DIAGNOSIS — E785 Hyperlipidemia, unspecified: Secondary | ICD-10-CM

## 2010-10-30 MED ORDER — ATORVASTATIN CALCIUM 40 MG PO TABS: 40.0000 mg | ORAL_TABLET | Freq: Every day | ORAL | Status: DC

## 2010-10-30 NOTE — Progress Notes (Signed)
HPI:  This is a 65 year old gentleman presenting for followup evaluation. He has coronary artery disease status post inferior MI approximately 5 years ago. Last year he was diagnosed with an abdominal aortic aneurysm as well as a left popliteal aneurysm. He underwent endovascular repair of both lesions. The patient is followed by Dr. Myra Gianotti with vascular surgery.  Overall he is doing well. He denies chest pain or pressure. He denies dyspnea, abdominal pain, palpitations, lightheadedness, syncope, or edema.  Outpatient Encounter Prescriptions as of 10/30/2010  Medication Sig Dispense Refill  . aspirin 81 MG EC tablet Take 81 mg by mouth daily.        . clopidogrel (PLAVIX) 75 MG tablet Take 75 mg by mouth daily.        Marland Kitchen lisinopril (PRINIVIL,ZESTRIL) 20 MG tablet Take 20 mg by mouth daily.        . metoprolol succinate (TOPROL-XL) 25 MG 24 hr tablet Take 25 mg by mouth 2 (two) times daily.        . niacin 500 MG tablet Take 500 mg by mouth 2 (two) times daily.        Marland Kitchen omega-3 acid ethyl esters (LOVAZA) 1 G capsule Take 2 g by mouth 2 (two) times daily.        . Saw Palmetto, Serenoa repens, 450 MG CAPS Take by mouth 2 (two) times daily.        . simvastatin (ZOCOR) 80 MG tablet Take 80 mg by mouth at bedtime.          Allergies  Allergen Reactions  . Omnipaque (Iohexol) Rash    Pt requires full premeds and needs to bring a driver.  Broke out in rash on back and arms.  Ok w/ premeds  . Oxytetracycline     Past Medical History  Diagnosis Date  . Abdominal aortic aneurysm   . Popliteal aneurysm   . Hyperlipidemia   . Hypertension   . Coronary artery disease   . Detached retina     ROS: Negative except as per HPI  BP 105/59  Pulse 57  Resp 18  Ht 6\' 2"  (1.88 m)  Wt 199 lb 1.9 oz (90.32 kg)  BMI 25.57 kg/m2  PHYSICAL EXAM: Pt is alert and oriented, NAD HEENT: normal Neck: JVP - normal, carotids 2+= without bruits Lungs: CTA bilaterally CV: RRR without murmur or gallop Abd:  soft, NT, Positive BS, no hepatomegaly Ext: no C/C/E, distal pulses intact and equal Skin: warm/dry no rash  EKG:  Sinus bradycardia 57 beats per minute, inferior infarct age undetermined.  ASSESSMENT AND PLAN:

## 2010-10-30 NOTE — Assessment & Plan Note (Signed)
The patient is stable without angina. He has good control of his heart rate and blood pressure. We'll continue his current medical program.

## 2010-10-30 NOTE — Assessment & Plan Note (Signed)
Lipids from May 2012 were reviewed. Total cholesterol was 115, HDL 28, LDL 57, triglycerides 151.  He is on a combination of niacin, high-dose simvastatin, and fish oil. I have recommended that he discontinue simvastatin and start atorvastatin to minimize drug drug interactions. He should have a followup lipid panel in 3 months.

## 2010-10-30 NOTE — Patient Instructions (Signed)
Your physician wants you to follow-up in: 6 months.  You will receive a reminder letter in the mail two months in advance. If you don't receive a letter, please call our office to schedule the follow-up appointment.  Your physician has recommended you make the following change in your medication:  Stop simvastatin. Start Lipitor 40 mg by mouth daily.  Your physician recommends that you have fasting lab work done in 8 -12 weeks (between November 12 and December 10).--Lipid and Liver profile--272.0

## 2010-11-27 ENCOUNTER — Encounter: Payer: Self-pay | Admitting: Surgery

## 2010-11-27 ENCOUNTER — Encounter (INDEPENDENT_AMBULATORY_CARE_PROVIDER_SITE_OTHER): Payer: Medicare Other | Admitting: *Deleted

## 2010-11-27 ENCOUNTER — Ambulatory Visit (INDEPENDENT_AMBULATORY_CARE_PROVIDER_SITE_OTHER): Payer: Medicare Other | Admitting: Surgery

## 2010-11-27 VITALS — BP 106/68 | HR 57 | Resp 14 | Ht 74.0 in | Wt 195.0 lb

## 2010-11-27 DIAGNOSIS — I724 Aneurysm of artery of lower extremity: Secondary | ICD-10-CM

## 2010-11-27 DIAGNOSIS — I714 Abdominal aortic aneurysm, without rupture: Secondary | ICD-10-CM

## 2010-11-27 DIAGNOSIS — Z48812 Encounter for surgical aftercare following surgery on the circulatory system: Secondary | ICD-10-CM

## 2010-11-27 NOTE — Progress Notes (Signed)
Addended by: Sharee Pimple on: 11/27/2010 01:56 PM   Modules accepted: Orders

## 2010-11-27 NOTE — Procedures (Unsigned)
LOWER EXTREMITY ARTERIAL DUPLEX  INDICATION:  Follow up left popliteal stent.  HISTORY: Diabetes:  No. Cardiac:  Yes. Hypertension:  Yes. Smoking:  No. Previous Surgery:  AAA stent graft 11/24/2009; left popliteal aneurysm repair, 11/23/2009.  SINGLE LEVEL ARTERIAL EXAM                         RIGHT                LEFT Brachial: Anterior tibial: Posterior tibial: Peroneal: Ankle/Brachial Index:  LOWER EXTREMITY ARTERIAL DUPLEX EXAM  DUPLEX: 1. Widely patent left SFA and popliteal stent with mild hyperplasia     observed.  Collaterals are visualized proximal and distal to the     stent. 2. See diagram for details.  IMPRESSION:  Patent left superficial femoral artery and popliteal stent with mild hyperplasia observed.  ___________________________________________ V. Charlena Cross, MD  LT/MEDQ  D:  11/27/2010  T:  11/27/2010  Job:  [id number]

## 2010-11-27 NOTE — Progress Notes (Signed)
[facility] [place]   Patient name: [name] [name] MRN: [id number] DOB: [birth year] Sex: male     Chief Complaint  Patient presents with  . Follow-up    AAA endograft, and Left popliteal aneurysm stent  11-2009    HISTORY OF PRESENT ILLNESS: The patient returns today for followup. He is status post endovascular repair of a left popliteal aneurysm on 11/23/2009 as well as percutaneous endovascular repair of abdominal aortic aneurysm on 11/24/2009 he has a known type II endoleak that we have been following with a stable aneurysm sac. He has no complaints today.  Past Medical History  Diagnosis Date  . Abdominal aortic aneurysm   . Popliteal aneurysm   . Hyperlipidemia   . Hypertension   . Coronary artery disease   . Detached retina     Past Surgical History  Procedure Date  . Coronary stenting     2007  . Abdominal aortic aneurysm repair 11/24/09    [facility]  . Popliteal artery stent 11/2009    left popliteal artery     History   Social History  . Marital Status: Married    Spouse Name: N/A    Number of Children: N/A  . Years of Education: N/A   Occupational History  . Not on file.   Social History Main Topics  . Smoking status: Former Smoker    Types: Cigarettes    Quit date: 09/21/1985  . Smokeless tobacco: Not on file  . Alcohol Use: No  . Drug Use: No  . Sexually Active:    Other Topics Concern  . Not on file   Social History Narrative  . No narrative on file    Family History  Problem Relation Age of Onset  . Aneurysm Sister     possibly   . Peripheral vascular disease Brother     Allergies as of 11/27/2010 - Review Complete 11/27/2010  Allergen Reaction Noted  . Omnipaque (iohexol) Rash 03/27/2010  . Oxytetracycline  10/11/2009    Current Outpatient Prescriptions on File Prior to Visit  Medication Sig Dispense Refill  . aspirin 81 MG EC tablet Take 81 mg by mouth daily.        Marland Kitchen atorvastatin (LIPITOR) 40 MG tablet  Take 1 tablet (40 mg total) by mouth daily.  30 tablet  11  . clopidogrel (PLAVIX) 75 MG tablet Take 75 mg by mouth daily.        Marland Kitchen lisinopril (PRINIVIL,ZESTRIL) 20 MG tablet Take 20 mg by mouth daily.        . metoprolol succinate (TOPROL-XL) 25 MG 24 hr tablet Take 25 mg by mouth 2 (two) times daily.        . niacin 500 MG tablet Take 500 mg by mouth 2 (two) times daily.        . Saw Palmetto, Serenoa repens, 450 MG CAPS Take by mouth 2 (two) times daily.           REVIEW OF SYSTEMS: No changes from prior visit.  PHYSICAL EXAMINATION: General: The patient appears their stated age.  Vital signs are BP 106/68  Pulse 57  Resp 14  Ht 6\' 2"  (1.88 m)  Wt 195 lb (88.451 kg)  BMI 25.04 kg/m2  SpO2 96% Pulmonary: Respirations are nonlabored  Abdomen: Soft and non-tender. Aorta is nontender and palpable Musculoskeletal: There are no major deformities.  There is no significant extremity pain. Neurologic: No focal weakness or paresthesias are detected, Skin: There are no ulcer  or rashes noted. Psychiatric: The patient has normal affect. Cardiovascular: There is a regular rate and rhythm without significant murmur appreciated.   Diagnostic Studies Ultrasound was ordered and reviewed today this shows a slight decrease in the size of his aneurysm sac and now measures 5.1 x 6.2 this is down from 5.7 x 6.1 no endoleak was detected today by ultrasound. The left popliteal stent is widely patent without stenosis  Assessment: Status post endovascular repair of a left popliteal and abdominal aortic aneurysm Plan: Final having the patient come back to see me in 6 months this time we will get a CT angiogram to make sure that his endoleak has resolved. I will also get a ultrasound of his popliteal stent on the left. We will also reevaluate the right popliteal artery for aneurysmal changes.  Jorge Ny, M.D. [facility] [place] Office: [phone]

## 2010-12-14 ENCOUNTER — Other Ambulatory Visit: Payer: Self-pay | Admitting: Surgery

## 2010-12-25 NOTE — Procedures (Unsigned)
VASCULAR LAB EXAM  INDICATION:  Follow up AAA  endograft placed 11/24/09.  History of known type 2 endoleak.  HISTORY: Diabetes:  No. Cardiac:  Yes. Hypertension:  Yes.  EXAM: 1. AAA sac size 5 .15 cm AP, 6.27 cm transverse. 2. Previous sac size by CT scan 03/27/2010:  5.7 cm AP, 6.1 cm     transverse.  IMPRESSION: 1. The aorta and endograft appear patent with normal waveforms within     and distal to the graft. 2. No significant change in size of the aneurysmal sac surrounding the     endograft. 3. No evidence of endoleak was detected.  ___________________________________________ V. Charlena Cross, MD  LT/MEDQ  D:  11/27/2010  T:  11/27/2010  Job:  [id number]

## 2011-01-02 ENCOUNTER — Encounter: Payer: Self-pay | Admitting: Cardiovascular Disease

## 2011-01-16 ENCOUNTER — Telehealth: Payer: Self-pay | Admitting: Cardiovascular Disease

## 2011-01-16 NOTE — Telephone Encounter (Signed)
New Msg: Pt calling wanting to know results of pt lab work. Please return pt cal to discuss.

## 2011-01-16 NOTE — Telephone Encounter (Signed)
Left message for pt to call back.  This pt's lab results are scanned into the system (01/02/11) but Dr Excell Seltzer has not reviewed at this time.

## 2011-01-17 ENCOUNTER — Telehealth: Payer: Self-pay | Admitting: Cardiovascular Disease

## 2011-01-17 NOTE — Telephone Encounter (Signed)
error 

## 2011-01-17 NOTE — Telephone Encounter (Signed)
I spoke with the pt and made him aware of lab results.  I gave the pt instructions about diet (decrease carbohydrate intake) and exercise.  The pt requested that a copy of lab be faxed to [phone].  I will send this message to Dr Excell Seltzer so that he can look over the pt's lab results.

## 2011-01-17 NOTE — Telephone Encounter (Signed)
Pt call [phone] calling for results

## 2011-01-18 NOTE — Telephone Encounter (Signed)
Labs reviewed. Agree with diet/exercise/lifestyle modification for treatment of hypertriglyceridemia and low HDL chol.

## 2011-05-25 ENCOUNTER — Encounter: Payer: Self-pay | Admitting: Neurosurgery

## 2011-05-28 ENCOUNTER — Encounter: Payer: Self-pay | Admitting: Surgery

## 2011-05-28 ENCOUNTER — Ambulatory Visit (INDEPENDENT_AMBULATORY_CARE_PROVIDER_SITE_OTHER): Payer: Medicare Other | Admitting: *Deleted

## 2011-05-28 ENCOUNTER — Ambulatory Visit
Admission: RE | Admit: 2011-05-28 | Discharge: 2011-05-28 | Disposition: A | Discharge status: Home / Self Care | Payer: Medicare Other | Source: Ambulatory Visit | Attending: Surgery | Admitting: Surgery

## 2011-05-28 ENCOUNTER — Encounter (INDEPENDENT_AMBULATORY_CARE_PROVIDER_SITE_OTHER): Payer: Medicare Other | Admitting: *Deleted

## 2011-05-28 ENCOUNTER — Ambulatory Visit (INDEPENDENT_AMBULATORY_CARE_PROVIDER_SITE_OTHER): Payer: Medicare Other | Admitting: Thoracic Diseases

## 2011-05-28 VITALS — BP 116/75 | HR 86 | Resp 20 | Ht 74.0 in | Wt 195.0 lb

## 2011-05-28 DIAGNOSIS — I714 Abdominal aortic aneurysm, without rupture, unspecified: Secondary | ICD-10-CM

## 2011-05-28 DIAGNOSIS — Z48812 Encounter for surgical aftercare following surgery on the circulatory system: Secondary | ICD-10-CM

## 2011-05-28 DIAGNOSIS — I724 Aneurysm of artery of lower extremity: Secondary | ICD-10-CM

## 2011-05-28 MED ORDER — IOHEXOL 350 MG/ML SOLN
100.0000 mL | Freq: Once | INTRAVENOUS | Status: AC | PRN
  Administered 2011-05-28: 100 mL via INTRAVENOUS

## 2011-05-28 NOTE — Progress Notes (Signed)
[facility] [place]  Established [facility] Date of Surgery: 1. 11/22/2009 Stent Left popliteal aneuryusm Gore viabahn stent 1O10 and 8x5 overlapping stents 2. 11/24/2009 Surgeon: Niel Hummer, MD Graft device: Talitha Givens  main body 26 x 14.5 x 16 Contralateral leg 40.5 x 12  History of Present Illness  [name] [name] is a 66 y.o. male who presents for routine follow up s/p EVAR.   CTA  demonstrates: Type 2 endoleak and decreased aneurysm sac size 6.1 x 5 cm  The patient denies back; denies abdominal pain; denies lower extremity pain.  USG of left pop stent shows good flow in patent stent Right distal SFA/pop aneurysm 1.4cm ABI's unchanged Pt C/O intermittent right flank pain and has a history of kidney stones. He states this pain is similar to pain he has had with kidney stones in the past  Past Medical History, Past Surgical History, Social History, Family History, Medications, Allergies, and Review of Systems were reviewed and are unchanged from previous evaluation.  Physical Examination  Filed Vitals:   05/28/11 1431  BP: 116/75  Pulse: 86  Resp: 20  Height: 6\' 2"  (1.88 m)  Weight: 195 lb (88.451 kg)    General: A&O x 3, WDWN male in NAD Gait: Normal HENT: WNL  Pulmonary: normal non-labored breathing  Cardiac: RRR,  Abdomen: soft, NT, no masses Skin: no rashes, ulcers noted Vascular Exam/Pulses: LE  PULSES                                  RIGHT                LEFT      FEMORAL palpable palpable       POPLITEAL palpable palpable       DORSALIS PEDIS      ANTERIOR TIBIAL palpable palpable   Extremities without ischemic changes, no Gangrene , no cellulitis; no open wounds;  Musculoskeletal: no muscle wasting or atrophy  Neurologic: A&O X 3; Appropriate Affect ; SENSATION: normal; MOTOR FUNCTION:  moving all extremities equally. Speech is fluent/normal  Non-Invasive Vascular Imaging: See HPI   [name] [name] is a 66 y.o. male who  presents s/p EVAR.  Pt is Asymptomatic with decreased sac size.  I discussed with the patient the importance of surveillance of the endograft.  The next endograft duplex will be scheduled for 6  Months. With duplex Bilat POP arteries to assess stent in left pop art and aneurysmal dilatation on the right  The patient will follow up with [place] in 6 months with these studies.  Clinic MD: Myra Gianotti, MD

## 2011-05-29 NOTE — Procedures (Unsigned)
LOWER EXTREMITY ARTERIAL DUPLEX  INDICATION:  Followup left popliteal stent for aneurysm repair  HISTORY: Diabetes:  No Cardiac:  Yes Hypertension:  Yes Smoking:  No Previous Surgery:  AAA stent graft 11/24/2009; popliteal stent 11/23/2009  SINGLE LEVEL ARTERIAL EXAM                         RIGHT                LEFT Brachial: Anterior tibial: Posterior tibial: Peroneal: Ankle/Brachial Index:   0.81                 1.08  LOWER EXTREMITY ARTERIAL DUPLEX EXAM  PREVIOUS ABI:  Date:  12/19/2009, right equals 0.87, left equals 1.08  DUPLEX:  Patent left popliteal stent with mild hyperplasia and triphasic waveforms throughout. Aneurysmal dilation of the right distal superficial femoral artery/above knee popliteal artery measuring approximately 1.4 cm.  IMPRESSION: 1. Patent left popliteal stent with mild hyperplasia observed. 2. Aneurysmal dilation of the right above knee popliteal artery     measuring approximately 1.4 cm.     ___________________________________________ V. Charlena Cross, MD  LT/MEDQ  D:  05/29/2011  T:  05/29/2011  Job:  [id number]

## 2011-07-11 ENCOUNTER — Ambulatory Visit (INDEPENDENT_AMBULATORY_CARE_PROVIDER_SITE_OTHER): Payer: Medicare Other | Admitting: Cardiovascular Disease

## 2011-07-11 ENCOUNTER — Encounter: Payer: Self-pay | Admitting: Cardiovascular Disease

## 2011-07-11 VITALS — BP 118/72 | HR 60 | Ht 74.0 in | Wt 204.0 lb

## 2011-07-11 DIAGNOSIS — I2510 Atherosclerotic heart disease of native coronary artery without angina pectoris: Secondary | ICD-10-CM

## 2011-07-11 NOTE — Progress Notes (Signed)
   HPI:   66 year old gentleman presenting for followup evaluation. The patient has coronary artery disease status post inferior MI approximately 6 years ago. He also has undergone endovascular treatment of abdominal aortic aneurysm and left popliteal aneurysm by Dr. Myra Gianotti. He presents today for followup evaluation.   The patient is doing well at present. He denies chest pain, chest pressure, dyspnea, edema, or palpitations. He's had no recent abdominal pain or leg pain. He has been compliant with his medications.  Outpatient Encounter Prescriptions as of 07/11/2011  Medication Sig Dispense Refill  . aspirin 81 MG EC tablet Take 81 mg by mouth daily.        Marland Kitchen atorvastatin (LIPITOR) 40 MG tablet Take 1 tablet (40 mg total) by mouth daily.  30 tablet  11  . fish oil-omega-3 fatty acids 1000 MG capsule Take by mouth daily.        Marland Kitchen lisinopril (PRINIVIL,ZESTRIL) 20 MG tablet Take 20 mg by mouth daily.        . metoprolol succinate (TOPROL-XL) 25 MG 24 hr tablet Take 25 mg by mouth 2 (two) times daily.        . niacin 500 MG tablet Take 500 mg by mouth 2 (two) times daily.        Marland Kitchen PLAVIX 75 MG tablet TAKE ONE TABLET BY MOUTH EVERY DAY  30 each  11  . Saw Palmetto, Serenoa repens, 450 MG CAPS Take by mouth 2 (two) times daily.          Allergies  Allergen Reactions  . Omnipaque (Iohexol) Rash    Pt requires full premeds and needs to bring a driver.  Broke out in rash on back and arms.  Ok w/ premeds  . Oxytetracycline     Past Medical History  Diagnosis Date  . Abdominal aortic aneurysm   . Popliteal aneurysm   . Hyperlipidemia   . Hypertension   . Coronary artery disease   . Detached retina     ROS: Negative except as per HPI  BP 118/72  Pulse 60  Ht 6\' 2"  (1.88 m)  Wt 92.534 kg (204 lb)  BMI 26.19 kg/m2  PHYSICAL EXAM: Pt is alert and oriented, NAD HEENT: normal Neck: JVP - normal, carotids 2+= without bruits Lungs: CTA bilaterally CV: RRR without murmur or gallop Abd:  soft, NT, Positive BS, no hepatomegaly Ext: no C/C/E, distal pulses intact and equal Skin: warm/dry no rash  EKG:  Normal sinus rhythm 60 beats per minute, left axis deviation, age indeterminate inferior infarct.  ASSESSMENT AND PLAN: 1. Coronary artery disease status post remote inferior MI. The patient is stable without anginal symptoms. He remains on long-term dual antiplatelet therapy. He does not have a strong indication to continue on Plavix from a cardiac perspective, but I suspect he will remain on both low-dose aspirin and Plavix with his history of popliteal stent grafting.  2. Hyperlipidemia. The patient is on a combination of atorvastatin, fish oil, and niacin. He is due for lipids and LFTs. He prefers to have this bloodwork drawn in [facility] by Dr. [name].  3. Peripheral arterial disease with abdominal aortic aneurysm and popliteal artery aneurysms. Followed closely by Dr. Myra Gianotti.

## 2011-07-11 NOTE — Patient Instructions (Signed)
Your physician wants you to follow-up in:  12 months.  You will receive a reminder letter in the mail two months in advance. If you don't receive a letter, please call our office to schedule the follow-up appointment.   

## 2011-11-15 ENCOUNTER — Other Ambulatory Visit: Payer: Self-pay | Admitting: Cardiovascular Disease

## 2011-11-16 NOTE — Telephone Encounter (Signed)
..   Requested Prescriptions   Pending Prescriptions Disp Refills  . atorvastatin (LIPITOR) 40 MG tablet [Pharmacy Med Name: ATORVASTATIN 40MG    TAB] 30 tablet 10    Sig: TAKE ONE TABLET BY MOUTH EVERY DAY

## 2011-11-30 ENCOUNTER — Encounter: Payer: Self-pay | Admitting: Surgery

## 2011-12-03 ENCOUNTER — Encounter (INDEPENDENT_AMBULATORY_CARE_PROVIDER_SITE_OTHER): Payer: Medicare Other | Admitting: *Deleted

## 2011-12-03 ENCOUNTER — Encounter: Payer: Self-pay | Admitting: Surgery

## 2011-12-03 ENCOUNTER — Ambulatory Visit (INDEPENDENT_AMBULATORY_CARE_PROVIDER_SITE_OTHER): Payer: Medicare Other | Admitting: Surgery

## 2011-12-03 VITALS — BP 108/59 | HR 51 | Ht 74.0 in | Wt 206.0 lb

## 2011-12-03 DIAGNOSIS — I724 Aneurysm of artery of lower extremity: Secondary | ICD-10-CM

## 2011-12-03 DIAGNOSIS — Z48812 Encounter for surgical aftercare following surgery on the circulatory system: Secondary | ICD-10-CM

## 2011-12-03 DIAGNOSIS — I714 Abdominal aortic aneurysm, without rupture: Secondary | ICD-10-CM

## 2011-12-03 NOTE — Progress Notes (Signed)
[facility]   Patient name: [name] [name] MRN: [id number] DOB: April 01, 1945 Sex: male     Chief Complaint  Patient presents with  . AAA    6 month f/u     HISTORY OF PRESENT ILLNESS: The patient is back today for followup. He is status post endovascular repair of abdominal aortic aneurysm in October of 2011. He has a known persistent type II endoleak from a patent lumbar artery. 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 He denies claudication symptoms. He denies abdominal or back pain.  Past Medical History  Diagnosis Date  . Abdominal aortic aneurysm   . Popliteal aneurysm   . Hyperlipidemia   . Hypertension   . Coronary artery disease   . Detached retina     Past Surgical History  Procedure Date  . Coronary stenting     2007  . Abdominal aortic aneurysm repair 11/24/09    EVAR  . Popliteal artery stent 11/2009    left popliteal artery     History   Social History  . Marital Status: Married    Spouse Name: N/A    Number of Children: N/A  . Years of Education: N/A   Occupational History  . Not on file.   Social History Main Topics  . Smoking status: Former Smoker -- 15 years    Types: Cigarettes    Quit date: 09/21/1985  . Smokeless tobacco: Current User    Types: Chew  . Alcohol Use: No  . Drug Use: No  . Sexually Active: Not on file   Other Topics Concern  . Not on file   Social History Narrative  . No narrative on file    Family History  Problem Relation Age of Onset  . Aneurysm Sister     possibly   . Peripheral vascular disease Brother     Allergies as of 12/03/2011 - Review Complete 12/03/2011  Allergen Reaction Noted  . Omnipaque (iohexol) Rash 03/27/2010  . Oxytetracycline  10/11/2009    Current Outpatient Prescriptions on File Prior to Visit  Medication Sig Dispense Refill  . aspirin 81 MG EC tablet Take 81 mg by mouth daily.        Marland Kitchen atorvastatin  (LIPITOR) 40 MG tablet TAKE ONE TABLET BY MOUTH EVERY DAY  30 tablet  10  . fish oil-omega-3 fatty acids 1000 MG capsule Take by mouth daily.        Marland Kitchen lisinopril (PRINIVIL,ZESTRIL) 20 MG tablet Take 20 mg by mouth daily.        . metoprolol succinate (TOPROL-XL) 25 MG 24 hr tablet Take 25 mg by mouth 2 (two) times daily.        . niacin 500 MG tablet Take 500 mg by mouth daily.       Marland Kitchen PLAVIX 75 MG tablet TAKE ONE TABLET BY MOUTH EVERY DAY  30 each  11  . Saw Palmetto, Serenoa repens, 450 MG CAPS Take by mouth 2 (two) times daily.           REVIEW OF SYSTEMS: No changes from prior visit  PHYSICAL EXAMINATION:   Vital signs are BP 108/59  Pulse 51  Ht 6\' 2"  (1.88 m)  Wt 206 lb (93.441 kg)  BMI 26.45 kg/m2  SpO2 100% General: The patient appears their stated age. HEENT:  No gross abnormalities Pulmonary:  Non labored breathing Abdomen: Soft and non-tender. Aorta non-palpable Musculoskeletal: There are  no major deformities. Neurologic: No focal weakness or paresthesias are detected, Skin: There are no ulcer or rashes noted. Extremities warm and well perfused Psychiatric: The patient has normal affect. Cardiovascular: There is a regular rate and rhythm without significant murmur appreciated. No carotid bruits   Diagnostic Studies Lower extremity duplex was ordered and reviewed. This shows stable aneurysmal changes to the right above-knee popliteal artery with maximum diameter of 1.34 cm. The left popliteal artery is widely patent with mild neointimal hyperplasia.  Aortic ultrasound was ordered and reviewed. This shows a stable aneurysm diameter.  Assessment: #1: Status post endovascular repair of abdominal aortic aneurysm #2: Status post endovascular repair of left popliteal aneurysm Plan: #1: I will plan on abdominal aortic ultrasound in 6 months. If there have been no significant changes I will get a CT angiogram in one year at his 3 year anniversary.  #2: The patient will  need duplex ultrasound of his bilateral lower extremities in one year  V. Charlena Cross, M.D. [facility] Office: [phone] Pager:  [phone]

## 2011-12-03 NOTE — Addendum Note (Signed)
Addended by: Sharee Pimple on: 12/03/2011 01:52 PM   Modules accepted: Orders

## 2011-12-18 ENCOUNTER — Other Ambulatory Visit: Payer: Self-pay | Admitting: *Deleted

## 2011-12-18 DIAGNOSIS — I739 Peripheral vascular disease, unspecified: Secondary | ICD-10-CM

## 2011-12-18 MED ORDER — CLOPIDOGREL BISULFATE 75 MG PO TABS: 75.0000 mg | ORAL_TABLET | Freq: Every day | ORAL | Status: DC

## 2012-06-02 ENCOUNTER — Other Ambulatory Visit: Payer: Medicare Other

## 2012-06-02 ENCOUNTER — Ambulatory Visit: Payer: Medicare Other | Admitting: Surgery

## 2012-06-20 ENCOUNTER — Ambulatory Visit (INDEPENDENT_AMBULATORY_CARE_PROVIDER_SITE_OTHER): Payer: Medicare Other | Admitting: Cardiovascular Disease

## 2012-06-20 ENCOUNTER — Encounter: Payer: Self-pay | Admitting: Cardiovascular Disease

## 2012-06-20 VITALS — BP 118/64 | HR 70 | Ht 74.0 in | Wt 208.0 lb

## 2012-06-20 DIAGNOSIS — E785 Hyperlipidemia, unspecified: Secondary | ICD-10-CM

## 2012-06-20 DIAGNOSIS — I2510 Atherosclerotic heart disease of native coronary artery without angina pectoris: Secondary | ICD-10-CM | POA: Diagnosis not present

## 2012-06-20 NOTE — Patient Instructions (Addendum)
Your physician wants you to follow-up in: 1 YEAR with Dr Excell Seltzer.  You will receive a reminder letter in the mail two months in advance. If you don't receive a letter, please call our office to schedule the follow-up appointment.  Your physician recommends that you continue on your current medications as directed. Please refer to the Current Medication list given to you today.  Please fax a copy of your lab results to [phone]

## 2012-06-20 NOTE — Progress Notes (Signed)
HPI:  67 year old gentleman presenting for followup evaluation. The patient has coronary artery disease status post inferior MI approximately 6 years ago. He also has undergone endovascular treatment of abdominal aortic aneurysm and left popliteal aneurysm by Dr. Myra Gianotti. He presents today for followup evaluation.   Is doing well from a cardiac perspective. He's active with yard work, but doesn't do any regular exercise. He denies chest pain, chest pressure, dyspnea, edema, or palpitations. He's had no abdominal or leg pain. He sees Dr. Myra Gianotti next month with vascular ultrasound studies.  Outpatient Encounter Prescriptions as of 06/20/2012  Medication Sig Dispense Refill  . aspirin 81 MG EC tablet Take 81 mg by mouth daily.        Marland Kitchen atorvastatin (LIPITOR) 40 MG tablet TAKE ONE TABLET BY MOUTH EVERY DAY  30 tablet  10  . clopidogrel (PLAVIX) 75 MG tablet Take 1 tablet (75 mg total) by mouth daily.  30 tablet  6  . lisinopril (PRINIVIL,ZESTRIL) 20 MG tablet Take 20 mg by mouth daily.        . metoprolol tartrate (LOPRESSOR) 25 MG tablet Take 25 mg by mouth 2 (two) times daily.       . Omega-3 Fatty Acids (OMEGA-3 FISH OIL PO) Take 1,400 mg by mouth daily.      . Saw Palmetto, Serenoa repens, 450 MG CAPS Take by mouth 2 (two) times daily.        . [DISCONTINUED] metoprolol succinate (TOPROL-XL) 25 MG 24 hr tablet Take 25 mg by mouth 2 (two) times daily.        . [DISCONTINUED] fish oil-omega-3 fatty acids 1000 MG capsule Take by mouth daily.        . [DISCONTINUED] niacin 500 MG tablet Take 500 mg by mouth daily.        No facility-administered encounter medications on file as of 06/20/2012.    Allergies  Allergen Reactions  . Omnipaque (Iohexol) Rash    Pt requires full premeds and needs to bring a driver.  Broke out in rash on back and arms.  Ok w/ premeds  . Oxytetracycline     Past Medical History  Diagnosis Date  . Abdominal aortic aneurysm   . Popliteal aneurysm   . Hyperlipidemia    . Hypertension   . Coronary artery disease   . Detached retina    ROS: Negative except as per HPI  BP 118/64  Pulse 70  Ht 6\' 2"  (1.88 m)  Wt 94.348 kg (208 lb)  BMI 26.69 kg/m2  SpO2 98%  PHYSICAL EXAM: Pt is alert and oriented, NAD HEENT: normal Neck: JVP - normal, carotids 2+= without bruits Lungs: CTA bilaterally CV: RRR without murmur or gallop Abd: soft, NT, Positive BS, no hepatomegaly Ext: no C/C/E, distal pulses intact and equal Skin: warm/dry no rash  EKG:  Normal sinus rhythm 70 beats per minute, age indeterminate inferior MI, possible age indeterminate lateral MI.  ASSESSMENT AND PLAN: 1. Coronary artery disease, native vessel. The patient has had no anginal symptoms since his myocardial infarction several years ago. He's had no problems with heart failure or other cardiac complications. He remained stable and will return in one year for followup. I reviewed his medications and make no changes today.  2. Hypertension. Blood pressure well controlled on lisinopril and metoprolol.  3. Hyperlipidemia. Lipids are followed by his primary physician. He is on a combination of atorvastatin and fish oil.  4. Abdominal aortic aneurysm and popliteal aneurysm. He's been treated with  stent grafting and follows with vascular surgery.  For follow-up I'll see him back in one year.  Tonny Bollman 06/20/2012 11:27 AM

## 2012-06-24 ENCOUNTER — Encounter: Payer: Self-pay | Admitting: Cardiovascular Disease

## 2012-06-24 DIAGNOSIS — Z95828 Presence of other vascular implants and grafts: Secondary | ICD-10-CM | POA: Diagnosis not present

## 2012-06-24 DIAGNOSIS — I252 Old myocardial infarction: Secondary | ICD-10-CM | POA: Diagnosis not present

## 2012-06-24 DIAGNOSIS — E785 Hyperlipidemia, unspecified: Secondary | ICD-10-CM | POA: Diagnosis not present

## 2012-07-15 ENCOUNTER — Other Ambulatory Visit: Payer: Self-pay | Admitting: *Deleted

## 2012-07-15 DIAGNOSIS — I739 Peripheral vascular disease, unspecified: Secondary | ICD-10-CM

## 2012-07-15 MED ORDER — CLOPIDOGREL BISULFATE 75 MG PO TABS: 75.0000 mg | ORAL_TABLET | Freq: Every day | ORAL | Status: DC

## 2012-07-25 ENCOUNTER — Encounter: Payer: Self-pay | Admitting: Surgery

## 2012-07-28 ENCOUNTER — Encounter: Payer: Self-pay | Admitting: Surgery

## 2012-07-28 ENCOUNTER — Encounter (INDEPENDENT_AMBULATORY_CARE_PROVIDER_SITE_OTHER): Payer: Medicare Other | Admitting: *Deleted

## 2012-07-28 ENCOUNTER — Ambulatory Visit (INDEPENDENT_AMBULATORY_CARE_PROVIDER_SITE_OTHER): Payer: Medicare Other | Admitting: Surgery

## 2012-07-28 VITALS — BP 94/60 | HR 51 | Resp 16 | Ht 74.0 in | Wt 203.0 lb

## 2012-07-28 DIAGNOSIS — I724 Aneurysm of artery of lower extremity: Secondary | ICD-10-CM | POA: Diagnosis not present

## 2012-07-28 DIAGNOSIS — Z48812 Encounter for surgical aftercare following surgery on the circulatory system: Secondary | ICD-10-CM

## 2012-07-28 DIAGNOSIS — I714 Abdominal aortic aneurysm, without rupture, unspecified: Secondary | ICD-10-CM

## 2012-07-28 NOTE — Progress Notes (Signed)
[facility]   Patient name: [name] [name] MRN: [id number] DOB: Jul 18, 1945 Sex: male     Chief Complaint  Patient presents with  . Aneurysm    6 mo F/up EVAR-  11/24/11 Left Pop- stent and   11/25/11  AAA/EVAR    HISTORY OF PRESENT ILLNESS: The patient is back today for followup. He is status post endovascular repair of abdominal aortic aneurysm in October of 2011. He has a known persistent type II endoleak from a patent lumbar artery, however the size of his aneurysm sac continues to decrease. 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 He denies claudication symptoms. He denies abdominal or back pain.    Past Medical History  Diagnosis Date  . Abdominal aortic aneurysm   . Popliteal aneurysm   . Hyperlipidemia   . Hypertension   . Coronary artery disease   . Detached retina     Past Surgical History  Procedure Laterality Date  . Coronary stenting      2007  . Abdominal aortic aneurysm repair  11/24/09    EVAR  . Popliteal artery stent  11/2009    left popliteal artery     History   Social History  . Marital Status: Married    Spouse Name: N/A    Number of Children: N/A  . Years of Education: N/A   Occupational History  . Not on file.   Social History Main Topics  . Smoking status: Former Smoker -- 15 years    Types: Cigarettes    Quit date: 09/21/1985  . Smokeless tobacco: Current User    Types: Chew  . Alcohol Use: No  . Drug Use: No  . Sexually Active: Not on file   Other Topics Concern  . Not on file   Social History Narrative  . No narrative on file    Family History  Problem Relation Age of Onset  . Aneurysm Sister     possibly   . Peripheral vascular disease Brother     Allergies as of 07/28/2012 - Review Complete 07/28/2012  Allergen Reaction Noted  . Omnipaque (iohexol) Rash 03/27/2010  . Oxytetracycline  10/11/2009    Current Outpatient Prescriptions  on File Prior to Visit  Medication Sig Dispense Refill  . aspirin 81 MG EC tablet Take 81 mg by mouth daily.        Marland Kitchen atorvastatin (LIPITOR) 40 MG tablet TAKE ONE TABLET BY MOUTH EVERY DAY  30 tablet  10  . clopidogrel (PLAVIX) 75 MG tablet Take 1 tablet (75 mg total) by mouth daily.  30 tablet  6  . lisinopril (PRINIVIL,ZESTRIL) 20 MG tablet Take 20 mg by mouth daily.        . metoprolol tartrate (LOPRESSOR) 25 MG tablet Take 25 mg by mouth 2 (two) times daily.       . Omega-3 Fatty Acids (OMEGA-3 FISH OIL PO) Take 1,400 mg by mouth daily.      . Saw Palmetto, Serenoa repens, 450 MG CAPS Take by mouth 2 (two) times daily.         No current facility-administered medications on file prior to visit.     REVIEW OF SYSTEMS: No changes from prior visit.  PHYSICAL EXAMINATION:   Vital signs are BP 94/60  Pulse 51  Resp 16  Ht 6\' 2"  (1.88 m)  Wt 203 lb (92.08 kg)  BMI 26.05 kg/m2  SpO2 99% General: The  patient appears their stated age. HEENT:  No gross abnormalities Pulmonary:  Non labored breathing Abdomen: Soft and non-tender Musculoskeletal: There are no major deformities. Neurologic: No focal weakness or paresthesias are detected, Skin: There are no ulcer or rashes noted. Psychiatric: The patient has normal affect. Cardiovascular: There is a regular rate and rhythm without significant murmur appreciated.   Diagnostic Studies Abdominal ultrasound was ordered and reviewed today. This shows the aneurysm sac diameter to be 5.5 cm.  Assessment: Status post endovascular aneurysm repair Status post left popliteal artery aneurysm repair, endovascular Small right popliteal artery aneurysm Plan: The patient's most recent CT scan shows a small persistent type II endoleak, however the aneurysm sac continues to decrease, therefore no intervention is recommended. I will continue to follow the patient with ultrasound. The neck study will be in one year.  The patient is status post  endovascular repair of his left popliteal artery aneurysm. This was not imaged today. When he comes back in one year for his abdominal aneurysm study, however this both his left and right popliteal arteries to make sure that the left popliteal stent remains widely patent, and to make sure that the size of the right popliteal artery aneurysm has not significantly increased.  Jorge Ny, M.D. [facility] Office: [phone] Pager:  [phone]

## 2012-07-28 NOTE — Addendum Note (Signed)
Addended by: Adria Dill L on: 07/28/2012 11:20 AM   Modules accepted: Orders

## 2012-10-10 ENCOUNTER — Other Ambulatory Visit: Payer: Self-pay | Admitting: Cardiovascular Disease

## 2012-11-12 ENCOUNTER — Other Ambulatory Visit: Payer: Self-pay | Admitting: Cardiovascular Disease

## 2012-11-21 ENCOUNTER — Ambulatory Visit: Payer: Self-pay | Admitting: Family Medicine

## 2012-11-21 DIAGNOSIS — R109 Unspecified abdominal pain: Secondary | ICD-10-CM | POA: Diagnosis not present

## 2012-11-21 DIAGNOSIS — N1330 Unspecified hydronephrosis: Secondary | ICD-10-CM | POA: Diagnosis not present

## 2012-11-21 DIAGNOSIS — I252 Old myocardial infarction: Secondary | ICD-10-CM | POA: Diagnosis not present

## 2012-11-21 DIAGNOSIS — Z1330 Encounter for screening examination for mental health and behavioral disorders, unspecified: Secondary | ICD-10-CM | POA: Diagnosis not present

## 2012-11-21 DIAGNOSIS — N400 Enlarged prostate without lower urinary tract symptoms: Secondary | ICD-10-CM | POA: Diagnosis not present

## 2012-11-21 DIAGNOSIS — N201 Calculus of ureter: Secondary | ICD-10-CM | POA: Diagnosis not present

## 2012-11-21 DIAGNOSIS — Z1331 Encounter for screening for depression: Secondary | ICD-10-CM | POA: Diagnosis not present

## 2012-11-21 DIAGNOSIS — E785 Hyperlipidemia, unspecified: Secondary | ICD-10-CM | POA: Diagnosis not present

## 2012-11-24 ENCOUNTER — Telehealth: Payer: Self-pay | Admitting: Cardiovascular Disease

## 2012-11-24 ENCOUNTER — Other Ambulatory Visit: Payer: Self-pay | Admitting: Urology

## 2012-11-24 DIAGNOSIS — N201 Calculus of ureter: Secondary | ICD-10-CM | POA: Diagnosis not present

## 2012-11-24 NOTE — Telephone Encounter (Signed)
Pam from Dr. Annabell Howells office called wanting to know if pt could hold Pavix for 5 days prior to having lithotripsy on Mon 10/20.  Advised that Dr. Excell Seltzer is on vac this week.  Will check w/Dr. Shirlee Latch ([facility]).  Per Dr. Shirlee Latch OK to stop as long as procedures not done within last year.  Advised Pam that he may stop Plavix.  She will notify pt.

## 2012-11-24 NOTE — Telephone Encounter (Signed)
New problem   Need clearance to hold Plavix 5 days for pt. Please advise

## 2012-11-27 ENCOUNTER — Encounter (HOSPITAL_COMMUNITY): Payer: Self-pay | Admitting: *Deleted

## 2012-11-27 ENCOUNTER — Telehealth: Payer: Self-pay | Admitting: *Deleted

## 2012-11-27 ENCOUNTER — Other Ambulatory Visit: Payer: Self-pay | Admitting: *Deleted

## 2012-11-27 ENCOUNTER — Encounter (HOSPITAL_COMMUNITY): Payer: Self-pay | Admitting: Pharmacy Technician

## 2012-11-27 NOTE — Telephone Encounter (Signed)
Mr. Broaddus called to ask if it would be all right to have a right proximal lithotripsy on 12-01-12 by Dr. Annabell Howells.(Patient is s/p EVAR in 2011 by Dr. Myra Gianotti); he was concern that the lithotripsy would harm his AAA repair. I reviewed this with Dr. Darrick Penna in the office today and he said there should not be any problem with having this procedure but he would suggest that the patient be on our antibiotic prophylactic protoccol. I called Mr. Delange and he already has Cipro 500mg  available at home. I instructed him on the appropriate dosing schedule and he voiced understanding. I next called Dr. Belva Crome office and let his nurse know what was being done; she will notify Dr. Annabell Howells of Dr. Darrick Penna instructions.

## 2012-11-27 NOTE — Progress Notes (Signed)
Patient states Dr. Dahlia Byes (Dr. that inserted his coronary stent)  Has given him Cipro to take Sat, Sun and Monday am prior to coming to short stay.

## 2012-11-28 NOTE — Progress Notes (Addendum)
Called and spoke with Dr Belva Crome RN about patient taking cipro 500 mg PO on Sat, Sun, and Mon prior to ESWL as per his cardiologist. Ronnald Nian whether patient should take cipro 500 mg prior to ESWL as patient would have taken cipro 500 mg that AM.   Received call back from Chasity Little from [facility]. Dr Annabell Howells would like patient to have his Cipro 500 mg PO prior to ESWL as his case is so late in the day.

## 2012-12-01 ENCOUNTER — Ambulatory Visit (HOSPITAL_COMMUNITY): Payer: Medicare Other

## 2012-12-01 ENCOUNTER — Ambulatory Visit (HOSPITAL_COMMUNITY)
Admission: RE | Admit: 2012-12-01 | Discharge: 2012-12-01 | Disposition: A | Discharge status: Home / Self Care | Payer: Medicare Other | Source: Ambulatory Visit | Attending: Urology | Admitting: Urology

## 2012-12-01 ENCOUNTER — Encounter (HOSPITAL_COMMUNITY)
Admission: RE | Disposition: A | Discharge status: Home / Self Care | Payer: Self-pay | Source: Ambulatory Visit | Attending: Urology

## 2012-12-01 DIAGNOSIS — N201 Calculus of ureter: Secondary | ICD-10-CM | POA: Insufficient documentation

## 2012-12-01 DIAGNOSIS — I252 Old myocardial infarction: Secondary | ICD-10-CM | POA: Insufficient documentation

## 2012-12-01 DIAGNOSIS — Z79899 Other long term (current) drug therapy: Secondary | ICD-10-CM | POA: Insufficient documentation

## 2012-12-01 HISTORY — DX: Acute myocardial infarction, unspecified: I21.9

## 2012-12-01 HISTORY — DX: Chronic kidney disease, unspecified: N18.9

## 2012-12-01 SURGERY — LITHOTRIPSY, ESWL
Anesthesia: LOCAL | Laterality: Right

## 2012-12-01 MED ORDER — DEXTROSE-NACL 5-0.45 % IV SOLN
INTRAVENOUS | Status: DC
  Administered 2012-12-01: 17:00:00 via INTRAVENOUS

## 2012-12-01 MED ORDER — ONDANSETRON HCL 4 MG/2ML IJ SOLN: 4.0000 mg | Freq: Four times a day (QID) | INTRAMUSCULAR | Status: DC | PRN

## 2012-12-01 MED ORDER — CIPROFLOXACIN HCL 500 MG PO TABS
500.0000 mg | ORAL_TABLET | ORAL | Status: AC
  Administered 2012-12-01: 500 mg via ORAL
  Filled 2012-12-01: qty 1

## 2012-12-01 MED ORDER — SODIUM CHLORIDE 0.9 % IJ SOLN: 3.0000 mL | Freq: Two times a day (BID) | INTRAMUSCULAR | Status: DC

## 2012-12-01 MED ORDER — SODIUM CHLORIDE 0.9 % IV SOLN: 250.0000 mL | INTRAVENOUS | Status: DC | PRN

## 2012-12-01 MED ORDER — SODIUM CHLORIDE 0.9 % IJ SOLN: 3.0000 mL | INTRAMUSCULAR | Status: DC | PRN

## 2012-12-01 MED ORDER — FENTANYL CITRATE 0.05 MG/ML IJ SOLN: 25.0000 ug | INTRAMUSCULAR | Status: DC | PRN

## 2012-12-01 MED ORDER — DIAZEPAM 5 MG PO TABS
10.0000 mg | ORAL_TABLET | ORAL | Status: AC
  Administered 2012-12-01: 10 mg via ORAL
  Filled 2012-12-01: qty 2

## 2012-12-01 MED ORDER — ACETAMINOPHEN 325 MG PO TABS: 650.0000 mg | ORAL_TABLET | ORAL | Status: DC | PRN

## 2012-12-01 MED ORDER — DIPHENHYDRAMINE HCL 25 MG PO CAPS
25.0000 mg | ORAL_CAPSULE | ORAL | Status: AC
  Administered 2012-12-01: 25 mg via ORAL
  Filled 2012-12-01: qty 1

## 2012-12-01 MED ORDER — OXYCODONE HCL 5 MG PO TABS: 5.0000 mg | ORAL_TABLET | ORAL | Status: DC | PRN

## 2012-12-01 MED ORDER — ACETAMINOPHEN 650 MG RE SUPP
650.0000 mg | RECTAL | Status: DC | PRN
  Filled 2012-12-01: qty 1

## 2012-12-01 NOTE — Interval H&P Note (Signed)
History and Physical Interval Note: stone hasn't moved  12/01/2012 5:33 PM  [name]  has presented today for surgery, with the diagnosis of Right Proximal Stone  The various methods of treatment have been discussed with the patient and family. After consideration of risks, benefits and other options for treatment, the patient has consented to  Procedure(s): RIGHT EXTRACORPOREAL SHOCK WAVE LITHOTRIPSY (ESWL) (Right) as a surgical intervention .  The patient's history has been reviewed, patient examined, no change in status, stable for surgery.  I have reviewed the patient's chart and labs.  Questions were answered to the patient's satisfaction.     [name] [name]

## 2012-12-01 NOTE — H&P (Signed)
ctive Problems Problems  1. Proximal Ureteral Stone On The Right 592.1  History of Present Illness  [name] [name] is a 67 yo WM sent by Dr. Yetta Numbers for a stone.  He had the onset about 3 weeks ago of right flank pain.   The pain is intermittant and he has not hurt for a couple of days.  He initially had hematuria without pain.   The pain was severe initially.  He was seen on 10/10 and had a CT that showed a 9mm right proximal stone with obstruction.  He has a large prostate and a prior aortic stent graft.  He is on saw palmetto but is leery of alpha blockers because he has a history of a detached retina.  He has a mildly slow stream but has no nocturia.  He hasn't had any increased voiding symptoms since the stone symptoms began.  He has had prior stones and has passed them previously.  He is on Plavix.   Past Medical History Problems  1. History of  Acute Myocardial Infarction V12.59 2. History of  Aneurysm Of The Abdominal Aorta 441.4 3. History of  Heart Disease 429.9 4. History of  Murmurs 785.2  Surgical History Problems  1. History of  Cath Stent Placement 2. History of  Endovascular Repair Of Aortic Aneurysm  Current Meds 1. Atorvastatin Calcium 40 MG Oral Tablet; Therapy: (Recorded:13Oct2014) to 2. Fish Oil CAPS; Therapy: (Recorded:13Oct2014) to 3. Lisinopril 20 MG Oral Tablet; Therapy: (Recorded:13Oct2014) to 4. Metoprolol Tartrate 25 MG Oral Tablet; Therapy: (Recorded:13Oct2014) to 5. Plavix 75 MG Oral Tablet; Therapy: (Recorded:13Oct2014) to  Allergies Medication  1. No Known Drug Allergies  Family History Problems  1. Family history of  Death In The Family Father 2. Family history of  Death In The Family Mother 3. Family history of  Family Health Status Number Of Children 1 son  Social History Problems    Alcohol Use 1 per day   Caffeine Use 3 per day   Chewing Nicotine-containing Substances Tobacco   Former Smoker V15.82 smoked 1ppd quit 35yrs ago    Marital History - Currently Married   Retired From Work  Review of Systems Genitourinary, constitutional, skin, eye, otolaryngeal, hematologic/lymphatic, cardiovascular, pulmonary, endocrine, musculoskeletal, gastrointestinal, neurological and psychiatric system(s) were reviewed and pertinent findings if present are noted.  Genitourinary: weak urinary stream and hematuria.    Vitals Vital Signs [Data Includes: Last 1 Day]  13Oct2014 12:12PM  BMI Calculated: 25.67 BSA Calculated: 2.17 Height: 6 ft 2 in Weight: 200 lb  Blood Pressure: 118 / 62 Temperature: 97.9 F Heart Rate: 55  Physical Exam Constitutional: Well nourished and well developed . No acute distress.  ENT:. The ears and nose are normal in appearance.  Neck: The appearance of the neck is normal and no neck mass is present.  Pulmonary: No respiratory distress and normal respiratory rhythm and effort.  Cardiovascular: Heart rate and rhythm are normal . No peripheral edema.  Abdomen: The abdomen is soft and nontender. No masses are palpated. No CVA tenderness. No hernias are palpable. No hepatosplenomegaly noted.  Lymphatics: The supraclavicular and inguinal nodes are not enlarged or tender.  Skin: Normal skin turgor, no visible rash and no visible skin lesions.  Neuro/Psych:. Mood and affect are appropriate.    Results/Data Urine [Data Includes: Last 1 Day]   13Oct2014  COLOR YELLOW   APPEARANCE CLOUDY   SPECIFIC GRAVITY 1.020   pH 6.0   GLUCOSE NEG mg/dL  BILIRUBIN NEG   KETONE  NEG mg/dL  BLOOD LARGE   PROTEIN NEG mg/dL  UROBILINOGEN 0.2 mg/dL  NITRITE NEG   LEUKOCYTE ESTERASE NEG   SQUAMOUS EPITHELIAL/HPF RARE   WBC 3-6 WBC/hpf  RBC TNTC RBC/hpf  BACTERIA FEW   CRYSTALS NONE SEEN   CASTS NONE SEEN    The following images/tracing/specimen were independently visualized:  I have reviewed his recent CT films and report. KUB today shows no change in the position of the 9mm right proximal stone over the L4  transverse process. His stent graft is apparent. There are no other significant findings.  UA reviewed.    Assessment Assessed  1. Proximal Ureteral Stone On The Right 592.1   He has a 9mm right proximal stone with obstruction but he has minimal symptoms at this time.   Plan Health Maintenance (V70.0)  1. UA With REFLEX  Done: 13Oct2014 11:33AM Proximal Ureteral Stone On The Right (592.1)  2. Hydrocodone-Acetaminophen 5-325 MG Oral Tablet; TAKE 1 TO 2 TABLETS BY MOUTH EVERY 4  TO 6 HOURS AS NEEDED FOR PAIN; Therapy: 13Oct2014 to (Last Rx:13Oct2014) 3. KUB  Done: 13Oct2014 12:00AM 4. Follow-up Schedule Surgery Office  Follow-up  Done: 13Oct2014   I discussed ESWL vs ureteroscopy.   He has a large prostate which might make ureteroscopy more difficult.  I am going to set him up for ESWL and reviewed the risks of bleeding, infection, injury to the kidney or adjacent organs that can be severe and life threatening, failure of fragmentation, need for secondary procedures and sedation risks.    He will have to be off of the plavix and Ibuprofen for the procedure and I will have him cleared by Dr. Excell Seltzer.   Discussion/Summary  CC: Dr. Yetta Numbers.

## 2012-12-04 DIAGNOSIS — N201 Calculus of ureter: Secondary | ICD-10-CM | POA: Diagnosis not present

## 2012-12-11 ENCOUNTER — Other Ambulatory Visit: Payer: Self-pay | Admitting: Cardiovascular Disease

## 2012-12-29 DIAGNOSIS — N201 Calculus of ureter: Secondary | ICD-10-CM | POA: Diagnosis not present

## 2013-02-24 ENCOUNTER — Other Ambulatory Visit: Payer: Self-pay | Admitting: *Deleted

## 2013-02-24 DIAGNOSIS — I739 Peripheral vascular disease, unspecified: Secondary | ICD-10-CM

## 2013-02-24 MED ORDER — CLOPIDOGREL BISULFATE 75 MG PO TABS: 75.0000 mg | ORAL_TABLET | Freq: Every day | ORAL | Status: DC

## 2013-03-26 ENCOUNTER — Other Ambulatory Visit: Payer: Self-pay | Admitting: Surgery

## 2013-03-26 DIAGNOSIS — I714 Abdominal aortic aneurysm, without rupture, unspecified: Secondary | ICD-10-CM

## 2013-03-26 DIAGNOSIS — I724 Aneurysm of artery of lower extremity: Secondary | ICD-10-CM

## 2013-03-26 DIAGNOSIS — Z48812 Encounter for surgical aftercare following surgery on the circulatory system: Secondary | ICD-10-CM

## 2013-05-13 DIAGNOSIS — E785 Hyperlipidemia, unspecified: Secondary | ICD-10-CM | POA: Diagnosis not present

## 2013-05-13 DIAGNOSIS — R109 Unspecified abdominal pain: Secondary | ICD-10-CM | POA: Diagnosis not present

## 2013-06-10 ENCOUNTER — Other Ambulatory Visit: Payer: Self-pay | Admitting: Cardiovascular Disease

## 2013-06-23 ENCOUNTER — Encounter: Payer: Self-pay | Admitting: Physician Assistant

## 2013-06-23 ENCOUNTER — Ambulatory Visit (INDEPENDENT_AMBULATORY_CARE_PROVIDER_SITE_OTHER): Payer: Medicare Other | Admitting: Physician Assistant

## 2013-06-23 VITALS — BP 100/66 | HR 57 | Ht 74.0 in | Wt 207.0 lb

## 2013-06-23 DIAGNOSIS — E785 Hyperlipidemia, unspecified: Secondary | ICD-10-CM

## 2013-06-23 DIAGNOSIS — I2510 Atherosclerotic heart disease of native coronary artery without angina pectoris: Secondary | ICD-10-CM | POA: Diagnosis not present

## 2013-06-23 DIAGNOSIS — I714 Abdominal aortic aneurysm, without rupture, unspecified: Secondary | ICD-10-CM | POA: Diagnosis not present

## 2013-06-23 DIAGNOSIS — I10 Essential (primary) hypertension: Secondary | ICD-10-CM | POA: Insufficient documentation

## 2013-06-23 NOTE — Progress Notes (Signed)
[address] [place], [place]  [address] Phone: [phone] Fax:  [phone]  Date:  06/23/2013   ID:  [name] Heritagehomas E Ekstrom, DOB 03/15/1945, MRN [id number]  PCP:  Tamsen Roershrismon, DENNIS E, PA  Cardiologist:  Dr. Tonny BollmanMichael Cooper     History of Present Illness: [name] [name] is a 68 y.o. male with a hx of CAD s/p inf MI in 09/2005 tx with BMS to R[facility] AAA and L popliteal aneurysm s/p endovascular repair in 11/2009 (Dr. Myra GianottiBrabham), HTN, HL.  He returns for follow up. He is doing well. He remains quite active. He works in his yard and Cytogeneticistmows the lawn (Firefighterpush mower). He denies chest pain, shortness of breath, syncope, orthopnea, PND or edema.   Studies:  - [facility] (09/2005):  insig CAD in CFX and LAD, RCA occluded, EF 59% => BMS to RCA    Recent Labs: No results found for requested labs within last 365 days.  Wt Readings from Last 3 Encounters:  06/23/13 207 lb (93.895 kg)  12/01/12 194 lb 9.6 oz (88.27 kg)  12/01/12 194 lb 9.6 oz (88.27 kg)     Past Medical History  Diagnosis Date  . Abdominal aortic aneurysm   . Popliteal aneurysm   . Hyperlipidemia   . Hypertension   . Coronary artery disease   . Detached retina   . Myocardial infarction     2007 heart stent  . Chronic kidney disease     Right kidney stone    Current Outpatient Prescriptions  Medication Sig Dispense Refill  . aspirin 81 MG EC tablet Take 81 mg by mouth daily.        Marland Kitchen. atorvastatin (LIPITOR) 40 MG tablet TAKE ONE TABLET BY MOUTH ONCE DAILY  30 tablet  0  . clopidogrel (PLAVIX) 75 MG tablet Take 1 tablet (75 mg total) by mouth daily.  30 tablet  6  . lisinopril (PRINIVIL,ZESTRIL) 20 MG tablet Take 20 mg by mouth daily with lunch.       . metoprolol tartrate (LOPRESSOR) 25 MG tablet Take 25 mg by mouth 2 (two) times daily.       . Omega-3 Fatty Acids (OMEGA-3 FISH OIL PO) Take 1,000 mg by mouth daily.       . Saw Palmetto, Serenoa repens, 450 MG CAPS Take 2 capsules by mouth 2 (two) times daily.         No current facility-administered medications for this visit.    Allergies:   Omnipaque and Oxytetracycline   Social History:  The patient  reports that he quit smoking about 27 years ago. His smoking use included Cigarettes. He smoked 0.00 packs per day for 15 years. His smokeless tobacco use includes Chew. He reports that he does not drink alcohol or use illicit drugs.   Family History:  The patient's family history includes Aneurysm in his sister; Peripheral vascular disease in his brother.   ROS:  Please see the history of present illness.      All other systems reviewed and negative.   PHYSICAL EXAM: VS:  BP 100/66  Pulse 57  Ht 6\' 2"  (1.88 m)  Wt 207 lb (93.895 kg)  BMI 26.57 kg/m2 Well nourished, well developed, in no acute distress HEENT: normal Neck: no JVD Vascular: No carotid bruits Cardiac:  normal S1, S2; RRR; no murmur Lungs:  clear to auscultation bilaterally, no wheezing, rhonchi or rales Abd: soft, nontender, no hepatomegaly Ext: no edema Skin: warm and dry Neuro:  CNs  2-12 intact, no focal abnormalities noted  EKG:  Sinus bradycardia, HR 57, LAD, inferior Q waves     ASSESSMENT AND PLAN:  1. CORONARY ATHEROSCLEROSIS NATIVE CORONARY ARTERY: He denies symptoms of angina. Continue aspirin, statin, beta blocker. He remains on Plavix as well. 2. HTN (hypertension): Controlled 3. HYPERLIPIDEMIA-MIXED: Management by PCP. 4. AAA (abdominal aortic aneurysm): Continue followup with vascular surgery. He inquired whether or not he can come off of Plavix. He is greater than one year since his PCI in the setting of ACS. From a cardiac standpoint this would be acceptable. However, he has been kept on Plavix since his endovascular stent. He should review this further with Dr. Myra GianottiBrabham before d/c this medication.  5. Disposition:  F/u with Dr. Tonny BollmanMichael Cooper in 1 year.   Signed, Tereso NewcomerScott Stephaney Steven, PA-C  06/23/2013 3:04 PM

## 2013-06-23 NOTE — Patient Instructions (Addendum)
Your physician wants you to follow-up in: 1 year with DR. Excell SeltzerCooper You will receive a reminder letter in the mail two months in advance. If you don't receive a letter, please call our office to schedule the follow-up appointment.   Your physician recommends that you continue on your current medications as directed. Please refer to the Current Medication list given to you today.

## 2013-07-09 ENCOUNTER — Other Ambulatory Visit: Payer: Self-pay | Admitting: Cardiovascular Disease

## 2013-07-13 HISTORY — PX: TOOTH EXTRACTION: SUR596

## 2013-07-24 ENCOUNTER — Telehealth: Payer: Self-pay

## 2013-07-24 NOTE — Telephone Encounter (Signed)
Notified pt. of Dr. Estanislado SpireBrabham's recommendations to discontinue the Plavix.  Verbalized understanding.

## 2013-07-24 NOTE — Telephone Encounter (Signed)
Message copied by Phillips OdorPULLINS, Golden Emile S on Fri Jul 24, 2013  5:22 PM ------      Message from: Nada LibmanBRABHAM, VANCE W      Created: Fri Jul 24, 2013  4:58 PM      Regarding: RE: question to stop Plavix       That would be ok      ----- Message -----         From: Erenest Blankarol Sue Maysel Mccolm, RN         Sent: 07/24/2013  12:26 PM           To: Nada LibmanVance W Brabham, MD      Subject: question to stop Plavix                                  Pt. has hx of EVAR and Left Popliteal Aneurysm Stent in 2011; he is scheduled for a tooth extraction 6/16.  He is asking if it is okay to hold the Plavix now through the procedure on 6/16, and then asked if he could completely stop taking the Plavix, since his procedure was in 2011.        ------

## 2013-07-31 ENCOUNTER — Encounter: Payer: Self-pay | Admitting: Family

## 2013-08-03 ENCOUNTER — Encounter: Payer: Self-pay | Admitting: Family

## 2013-08-03 ENCOUNTER — Ambulatory Visit (INDEPENDENT_AMBULATORY_CARE_PROVIDER_SITE_OTHER)
Admission: RE | Admit: 2013-08-03 | Discharge: 2013-08-03 | Disposition: A | Discharge status: Home / Self Care | Payer: Medicare Other | Source: Ambulatory Visit | Attending: Surgery | Admitting: Surgery

## 2013-08-03 ENCOUNTER — Ambulatory Visit (INDEPENDENT_AMBULATORY_CARE_PROVIDER_SITE_OTHER): Payer: Medicare Other | Admitting: Family

## 2013-08-03 ENCOUNTER — Ambulatory Visit (HOSPITAL_COMMUNITY)
Admission: RE | Admit: 2013-08-03 | Discharge: 2013-08-03 | Disposition: A | Discharge status: Home / Self Care | Payer: Medicare Other | Source: Ambulatory Visit | Attending: Family | Admitting: Family

## 2013-08-03 VITALS — BP 107/67 | HR 48 | Resp 14 | Ht 74.0 in | Wt 202.0 lb

## 2013-08-03 DIAGNOSIS — I714 Abdominal aortic aneurysm, without rupture, unspecified: Secondary | ICD-10-CM | POA: Insufficient documentation

## 2013-08-03 DIAGNOSIS — Z48812 Encounter for surgical aftercare following surgery on the circulatory system: Secondary | ICD-10-CM | POA: Insufficient documentation

## 2013-08-03 DIAGNOSIS — I724 Aneurysm of artery of lower extremity: Secondary | ICD-10-CM

## 2013-08-03 DIAGNOSIS — I739 Peripheral vascular disease, unspecified: Secondary | ICD-10-CM | POA: Diagnosis not present

## 2013-08-03 NOTE — Progress Notes (Signed)
[facility]  [facility]  History of Present Illness  [name] [name] is a 68 y.o. (07/04/1945) male patient of Dr. Myra GianottiBrabham. The patient is back today for followup. He is status post endovascular repair of abdominal aortic aneurysm in October of 2011. He has a known persistent type II endoleak from a patent lumbar artery, however the size of his aneurysm sac continues to decrease. 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 He denies claudication symptoms, he denies non healing wounds in feet and legs. He denies abdominal or back pain.  He had a tooth extracted recently, he stopped the Plavix for this and did not resume, he does take a daily ASA, daily statin, and beta blocker. He had an MI in 2007 with cardiac stent placement. He denies any history of stroke or TIA.  Pt Diabetic: No Pt smoker: former smoker, quit in 1988   Past Medical History  Diagnosis Date  . Abdominal aortic aneurysm   . Popliteal aneurysm   . Hyperlipidemia   . Hypertension   . Coronary artery disease   . Detached retina   . Myocardial infarction     2007 heart stent  . Chronic kidney disease     Right kidney stone   Past Surgical History  Procedure Laterality Date  . Coronary stenting      2007  . Abdominal aortic aneurysm repair  11/24/09    EVAR  . Popliteal artery stent  11/2009    left popliteal artery   . Retinal detachment surgery Right 2011  . Tooth extraction  June 2015    Wisdom Tooth Extraction   Social History History  Substance Use Topics  . Smoking status: Former Smoker -- 15 years    Types: Cigarettes    Quit date: 09/21/1985  . Smokeless tobacco: Current User    Types: Chew  . Alcohol Use: No   Family History Family History  Problem Relation Age of Onset  . Aneurysm Sister     possibly   . Peripheral vascular disease Brother    Current Outpatient Prescriptions on File Prior to Visit   Medication Sig Dispense Refill  . aspirin 81 MG EC tablet Take 81 mg by mouth daily.        Marland Kitchen. atorvastatin (LIPITOR) 40 MG tablet TAKE ONE TABLET BY MOUTH ONCE DAILY *NEEDS OFFICE VISIT*  30 tablet  5  . lisinopril (PRINIVIL,ZESTRIL) 20 MG tablet Take 20 mg by mouth daily with lunch.       . metoprolol tartrate (LOPRESSOR) 25 MG tablet Take 25 mg by mouth 2 (two) times daily.       . Omega-3 Fatty Acids (OMEGA-3 FISH OIL PO) Take 1,000 mg by mouth daily.       . Saw Palmetto, Serenoa repens, 450 MG CAPS Take 2 capsules by mouth 2 (two) times daily.       . clopidogrel (PLAVIX) 75 MG tablet Take 1 tablet (75 mg total) by mouth daily.  30 tablet  6   No current facility-administered medications on file prior to visit.   Allergies  Allergen Reactions  . Omnipaque [Iohexol] Rash    Pt requires full premeds and needs to bring a driver.  Broke out in rash on back and arms.  Ok w/ premeds  . Oxytetracycline Rash    Terramycin- RASH      ROS: See HPI for pertinent positives and negatives.  Physical Examination  Filed Vitals:   08/03/13 1113  BP: 107/67  Pulse: 48  Resp: 14  Height: 6\' 2"  (1.88 m)  Weight: 202 lb (91.627 kg)  SpO2: 99%   Body mass index is 25.92 kg/(m^2).  General: A&O x 3, WD male  Pulmonary: Sym exp, good air movt, CTAB, no rales, rhonchi, or wheezing   Cardiac: RRR, Nl S1, S2, no Murmur detected  Vascular: Vessel Right Left  Radial 2+Palpable 2+Palpable  Carotid  without bruit  without bruit  Aorta Not palpable N/A  Femoral 3+Palpable 2+Palpable  Popliteal Not palpable Not palpable  PT notPalpable 2+Palpable  DP Not Palpable Not Palpable   Gastrointestinal: soft, NTND, -G/R, - HSM, - masses, - CVAT B.  Musculoskeletal: M/S 5/5 throughout, Extremities without ischemic changes, feet are well perfused.  Neurologic: Pain and light touch intact in extremities, Motor exam as listed above  Non-Invasive Vascular Imaging  EVAR Duplex (Date:  08/03/2013) ABDOMINAL AORTA DUPLEX EVALUATION - POST ENDOVASCULAR REPAIR    INDICATION: Follow-up aortic aneurysm stent repair    PREVIOUS INTERVENTION(S): Endovascular aortic repair 11/24/2009    DUPLEX EXAM:      DIAMETER AP (cm) DIAMETER TRANSVERSE (cm) VELOCITIES (cm/sec)  Aorta 4.6 5.1 55  Right Common Iliac 1.3  51  Left Common Iliac 1.24  84    Comparison Study  Ultrasound     Date DIAMETER AP (cm) DIAMETER TRANSVERSE (cm)  07/28/2012 5.04 5.52     ADDITIONAL FINDINGS:     IMPRESSION: Patent endovascular aortic repair with triphasic waveforms in the iliac limbs. Known endoleak could not be visualized due to bowel gas.    Compared to the previous exam:  No significant change compared to prior exam.     LOWER EXTREMITY ARTERIAL EVALUATION    INDICATION: Follow-up left popliteal artery stent    PREVIOUS INTERVENTION(S): Endovascular aortic repair 11/24/2009 Left popliteal artery stent for aneurysm repair 12/02/2009 Known small right popliteal aneurysm    DUPLEX EXAM:     RIGHT  LEFT   Peak Systolic Velocity (cm/s) Ratio (if abnormal) Waveform  Peak Systolic Velocity (cm/s) Ratio (if abnormal) Waveform     Artery - Proximal to Stent 65  T     Stent - Origin 53  T     Stent - Proximal 53  T     Stent - Mid 47  T     Stent - Distal 50  T      Stent - End 56  T     Artery - Distal to Stent 50  T  0.81 Today's ABI / TBI 1.21  0.81 Previous ABI / TBI (12/03/2011  ) 1.08    Waveform:    M - Monophasic       B - Biphasic       T - Triphasic  If Ankle Brachial Index (ABI) or Toe Brachial Index (TBI) performed, please see complete report     ADDITIONAL FINDINGS: There is a thrombosed saccular aneurysm observed in the distal superficial femoral artery with more fusiform dilation just beyond. Maximum diameter is 1.4cm.    IMPRESSION: 1. Patent left popliteal artery stent with evidence of minimal hyperplasia observed within the stent.    Compared to the previous  exam:  No significant change compared to prior exam.     Medical Decision Making  [name] [name] is a 68 y.o. male who presents s/p EVAR in October of 2011.  Pt is asymptomatic with decrease in sac size. He is also  status post endovascular repair of a left popliteal aneurysm and October of 2011. He denies claudication symptoms,  I discussed with the patient the importance of surveillance of the endograft.  The next endograft duplex will be scheduled for 12 months.   Will also check bilateral LE arterial Duplex in a year. Patent left popliteal artery stent with evidence of minimal hyperplasia observed within the stent.  There is a thrombosed saccular aneurysm observed in the distal superficial femoral artery with more fusiform dilation just beyond. Maximum diameter is 1.4cm, was 1.34 cm in 2013. No significant change compared to prior exam.   ABI's of right leg indicate mild arterial occlusive disease, left leg ABI is normal.    The patient will follow up with us in 12 months with these studies.  I emphasized the importance of maximal medical management including strict control of blood pressure, blood glucose, and lipid levels, antiplatelet agents, obtaining regular exercise, and cessation of smoking.   The patient was given information about AAA including signs, symptoms, treatment, and how to minimize the risk of enlargement and rupture of aneurysms.    Thank you for allowing us to participate in this patient's care.  Charisse MarchSuzanne Ayiden Milliman, RN, MSN, FNP-C [facility] [place] Office: [phone]  Clinic Physician: Myra GianottiBrabham   08/03/2013, 11:26 AM

## 2013-08-03 NOTE — Patient Instructions (Signed)
Abdominal Aortic Aneurysm An aneurysm is a weakened or damaged part of an artery wall that bulges from the normal force of blood pumping through the body. An abdominal aortic aneurysm is an aneurysm that occurs in the lower part of the aorta, the main artery of the body.  The major concern with an abdominal aortic aneurysm is that it can enlarge and burst (rupture) or blood can flow between the layers of the wall of the aorta through a tear (aorticdissection). Both of these conditions can cause bleeding inside the body and can be life threatening unless diagnosed and treated promptly. CAUSES  The exact cause of an abdominal aortic aneurysm is unknown. Some contributing factors are:   A hardening of the arteries caused by the buildup of fat and other substances in the lining of a blood vessel (arteriosclerosis).  Inflammation of the walls of an artery (arteritis).   Connective tissue diseases, such as Marfan syndrome.   Abdominal trauma.   An infection, such as syphilis or staphylococcus, in the wall of the aorta (infectious aortitis) caused by bacteria. RISK FACTORS  Risk factors that contribute to an abdominal aortic aneurysm may include:  Age older than 60 years.   High blood pressure (hypertension).  Male gender.  Ethnicity (white race).  Obesity.  Family history of aneurysm (first degree relatives only).  Tobacco use. PREVENTION  The following healthy lifestyle habits may help decrease your risk of abdominal aortic aneurysm:  Quitting smoking. Smoking can raise your blood pressure and cause arteriosclerosis.  Limiting or avoiding alcohol.  Keeping your blood pressure, blood sugar level, and cholesterol levels within normal limits.  Decreasing your salt intake. In somepeople, too much salt can raise blood pressure and increase your risk of abdominal aortic aneurysm.  Eating a diet low in saturated fats and cholesterol.  Increasing your fiber intake by including  whole grains, vegetables, and fruits in your diet. Eating these foods may help lower blood pressure.  Maintaining a healthy weight.  Staying physically active and exercising regularly. SYMPTOMS  The symptoms of abdominal aortic aneurysm may vary depending on the size and rate of growth of the aneurysm.Most grow slowly and do not have any symptoms. When symptoms do occur, they may include:  Pain (abdomen, side, lower back, or groin). The pain may vary in intensity. A sudden onset of severe pain may indicate that the aneurysm has ruptured.  Feeling full after eating only small amounts of food.  Nausea or vomiting or both.  Feeling a pulsating lump in the abdomen.  Feeling faint or passing out. DIAGNOSIS  Since most unruptured abdominal aortic aneurysms have no symptoms, they are often discovered during diagnostic exams for other conditions. An aneurysm may be found during the following procedures:  Ultrasonography (A one-time screening for abdominal aortic aneurysm by ultrasonography is also recommended for all men aged 65-75 years who have ever smoked).  X-ray exams.  A computed tomography (CT).  Magnetic resonance imaging (MRI).  Angiography or arteriography. TREATMENT  Treatment of an abdominal aortic aneurysm depends on the size of your aneurysm, your age, and risk factors for rupture. Medication to control blood pressure and pain may be used to manage aneurysms smaller than 6 cm. Regular monitoring for enlargement may be recommended by your caregiver if:  The aneurysm is 3-4 cm in size (an annual ultrasonography may be recommended).  The aneurysm is 4-4.5 cm in size (an ultrasonography every 6 months may be recommended).  The aneurysm is larger than 4.5 cm in   size (your caregiver may ask that you be examined by a vascular surgeon). If your aneurysm is larger than 6 cm, surgical repair may be recommended. There are two main methods for repair of an aneurysm:   Endovascular  repair (a minimally invasive surgery). This is done most often.  Open repair. This method is used if an endovascular repair is not possible. Document Released: 11/08/2004 Document Revised: 05/26/2012 Document Reviewed: 02/29/2012 [employer] Patient Information 2015 [employer]. This information is not intended to replace advice given to you by your health care provider. Make sure you discuss any questions you have with your health care provider.  

## 2013-10-20 DIAGNOSIS — Z1331 Encounter for screening for depression: Secondary | ICD-10-CM | POA: Diagnosis not present

## 2013-10-20 DIAGNOSIS — Z1342 Encounter for screening for global developmental delays (milestones): Secondary | ICD-10-CM | POA: Diagnosis not present

## 2013-10-20 DIAGNOSIS — T148XXA Other injury of unspecified body region, initial encounter: Secondary | ICD-10-CM | POA: Diagnosis not present

## 2013-10-20 DIAGNOSIS — I252 Old myocardial infarction: Secondary | ICD-10-CM | POA: Diagnosis not present

## 2013-10-20 DIAGNOSIS — I999 Unspecified disorder of circulatory system: Secondary | ICD-10-CM | POA: Diagnosis not present

## 2013-10-20 DIAGNOSIS — Z1330 Encounter for screening examination for mental health and behavioral disorders, unspecified: Secondary | ICD-10-CM | POA: Diagnosis not present

## 2013-10-21 ENCOUNTER — Other Ambulatory Visit: Payer: Self-pay | Admitting: Vascular Surgery

## 2013-10-21 ENCOUNTER — Ambulatory Visit (INDEPENDENT_AMBULATORY_CARE_PROVIDER_SITE_OTHER)
Admission: RE | Admit: 2013-10-21 | Discharge: 2013-10-21 | Disposition: A | Discharge status: Home / Self Care | Payer: Medicare Other | Source: Ambulatory Visit | Attending: Vascular Surgery | Admitting: Vascular Surgery

## 2013-10-21 ENCOUNTER — Ambulatory Visit (INDEPENDENT_AMBULATORY_CARE_PROVIDER_SITE_OTHER): Payer: Medicare Other | Admitting: Family

## 2013-10-21 ENCOUNTER — Encounter: Payer: Self-pay | Admitting: Family

## 2013-10-21 ENCOUNTER — Other Ambulatory Visit: Payer: Self-pay

## 2013-10-21 VITALS — BP 100/63 | HR 61 | Temp 97.7°F | Resp 14 | Ht 74.0 in | Wt 206.0 lb

## 2013-10-21 DIAGNOSIS — I2510 Atherosclerotic heart disease of native coronary artery without angina pectoris: Secondary | ICD-10-CM | POA: Diagnosis not present

## 2013-10-21 DIAGNOSIS — R209 Unspecified disturbances of skin sensation: Secondary | ICD-10-CM | POA: Diagnosis not present

## 2013-10-21 DIAGNOSIS — R238 Other skin changes: Secondary | ICD-10-CM

## 2013-10-21 DIAGNOSIS — L539 Erythematous condition, unspecified: Secondary | ICD-10-CM | POA: Diagnosis not present

## 2013-10-21 DIAGNOSIS — M79609 Pain in unspecified limb: Secondary | ICD-10-CM

## 2013-10-21 DIAGNOSIS — M79662 Pain in left lower leg: Secondary | ICD-10-CM | POA: Insufficient documentation

## 2013-10-21 DIAGNOSIS — R202 Paresthesia of skin: Secondary | ICD-10-CM | POA: Insufficient documentation

## 2013-10-21 NOTE — Progress Notes (Signed)
[facility] HISTORY AND PHYSICAL -PAD  History of Present Illness [name] [name] is a 68 y.o. male patient of Dr. Myra Gianotti.  He returns today with left calf puncture wound from a thorny plant 6 days ago. Less pain in left calf today than 6 days ago, still has numbness in left foot. Dr. [name] checked pt's leg, he had concerns re the left cold foot with blanching. Pt denies fever or chills.   He is status post endovascular repair of abdominal aortic aneurysm in October of 2011. He has a known persistent type II endoleak from a patent lumbar artery, however the size of his aneurysm sac continues to decrease. 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  He denies abdominal or back pain.  He had a tooth extracted recently, he stopped the Plavix for this and did not resume, he does take a daily ASA, daily statin, and beta blocker.  He had an MI in 2007 with cardiac stent placement.  He denies any history of stroke or TIA.   Pt Diabetic: No  Pt smoker: former smoker, quit in 1988    Pt meds include: Statin :Yes Betablocker: Yes ASA: Yes Other anticoagulants/antiplatelets: Plavix  Past Medical History  Diagnosis Date  . Abdominal aortic aneurysm   . Popliteal aneurysm   . Hyperlipidemia   . Hypertension   . Coronary artery disease   . Detached retina   . Myocardial infarction     2007 heart stent  . Chronic kidney disease     Right kidney stone    Social History History  Substance Use Topics  . Smoking status: Former Smoker -- 15 years    Types: Cigarettes    Quit date: 09/21/1985  . Smokeless tobacco: Current User    Types: Chew  . Alcohol Use: No    Family History Family History  Problem Relation Age of Onset  . Aneurysm Sister     possibly   . Peripheral vascular disease Brother     Past Surgical History  Procedure Laterality Date  . Coronary stenting      2007  . Abdominal  aortic aneurysm repair  11/24/09    EVAR  . Popliteal artery stent  11/2009    left popliteal artery   . Retinal detachment surgery Right 2011  . Tooth extraction  June 2015    Wisdom Tooth Extraction    Allergies  Allergen Reactions  . Omnipaque [Iohexol] Rash    Pt requires full premeds and needs to bring a driver.  Broke out in rash on back and arms.  Ok w/ premeds  . Oxytetracycline Rash    Terramycin- RASH     Current Outpatient Prescriptions  Medication Sig Dispense Refill  . aspirin 81 MG EC tablet Take 81 mg by mouth daily.        Marland Kitchen atorvastatin (LIPITOR) 40 MG tablet TAKE ONE TABLET BY MOUTH ONCE DAILY *NEEDS OFFICE VISIT*  30 tablet  5  . lisinopril (PRINIVIL,ZESTRIL) 20 MG tablet Take 20 mg by mouth daily with lunch.       . metoprolol tartrate (LOPRESSOR) 25 MG tablet Take 25 mg by mouth 2 (two) times daily.       . Omega-3 Fatty Acids (OMEGA-3 FISH OIL PO) Take 1,000 mg by mouth daily.       . Saw Palmetto, Serenoa repens, 450 MG CAPS Take 2 capsules by mouth 2 (two) times daily.       Marland Kitchen  clopidogrel (PLAVIX) 75 MG tablet Take 1 tablet (75 mg total) by mouth daily.  30 tablet  6   No current facility-administered medications for this visit.    ROS: See HPI for pertinent positives and negatives.   Physical Examination  Filed Vitals:   10/21/13 0936  BP: 100/63  Pulse: 61  Temp: 97.7 F (36.5 C)  TempSrc: Oral  Resp: 14  Height:  (1.88 m)  Weight: 206 lb (93.441 kg)  SpO2: 98%   Body mass index is 26.44 kg/(m^2).  General: A&O x 3, WD male  Pulmonary: Respirations are non labored, no cough. Cardiac: RRR  Vascular: Left foot is blanched when elevated, no erythema in his left LE, healing small laceration at posterior left calf with no evidence of infection, no streaking.  Vessel  Right  Left   Radial  2+Palpable  2+Palpable   Carotid  without bruit  without bruit   Aorta  Not palpable  N/A   Femoral  3+Palpable  2+Palpable   Popliteal  Not  palpable  Not palpable   PT  Not Palpable, faintly audible by Doppler Not audible by Doppler, was 2+ palpable in June, 2015  DP  Not Palpable, audible by Doppler, monophasic  Not Palpable in June, 2015, not audible by Doppler today   Gastrointestinal: soft, NTND, -G/R, - HSM, - masses, - CVAT B.  Musculoskeletal: M/S 5/5 throughout, see Vascular.  Neurologic: CN 2-12 grossly intact, Motor exam as listed above    Non-Invasive Vascular Imaging  EVAR Duplex (Date: 08/03/2013)  ABDOMINAL AORTA DUPLEX EVALUATION - POST ENDOVASCULAR REPAIR     INDICATION:  Follow-up aortic aneurysm stent repair     PREVIOUS INTERVENTION(S):  Endovascular aortic repair 11/24/2009     DUPLEX EXAM:       DIAMETER AP (cm)  DIAMETER TRANSVERSE (cm)  VELOCITIES (cm/sec)   Aorta  4.6  5.1  55   Right Common Iliac  1.3   51   Left Common Iliac  1.24   84     Comparison Study  Ultrasound      Date  DIAMETER AP (cm)  DIAMETER TRANSVERSE (cm)   07/28/2012  5.04  5.52      ADDITIONAL FINDINGS:      IMPRESSION:  Patent endovascular aortic repair with triphasic waveforms in the iliac limbs. Known endoleak could not be visualized due to bowel gas.     Compared to the previous exam:  No significant change compared to prior exam.    LOWER EXTREMITY ARTERIAL EVALUATION (Date: 08/03/2013)     INDICATION:  Follow-up left popliteal artery stent     PREVIOUS INTERVENTION(S):  Endovascular aortic repair 11/24/2009  Left popliteal artery stent for aneurysm repair 12/02/2009  Known small right popliteal aneurysm     DUPLEX EXAM:      RIGHT   LEFT   Peak Systolic Velocity (cm/s)  Ratio (if abnormal)  Waveform   Peak Systolic Velocity (cm/s)  Ratio (if abnormal)  Waveform      Artery - Proximal to Stent  65   T      Stent - Origin  53   T      Stent - Proximal  53   T      Stent - Mid  47   T      Stent - Distal  50   T      Stent - End  56   T      Artery -  Distal to Stent  50   T   0.81  Today's ABI / TBI   1.21   0.81  Previous ABI / TBI (12/03/2011 )  1.08     Waveform: M - Monophasic B - Biphasic T - Triphasic  If Ankle Brachial Index (ABI) or Toe Brachial Index (TBI) performed, please see complete report     ADDITIONAL FINDINGS:  There is a thrombosed saccular aneurysm observed in the distal superficial femoral artery with more fusiform dilation just beyond. Maximum diameter is 1.4cm.     IMPRESSION:  Patent left popliteal artery stent with evidence of minimal hyperplasia observed within the stent.     Compared to the previous exam:  No significant change compared to prior exam.      Non-Invasive Vascular Imaging: DATE: 10/21/2013 LOWER EXTREMITY ARTERIAL EVALUATION    INDICATION: Left lower extremity pain, cold, redness     PREVIOUS INTERVENTION(S): Left superficial femoral-popliteal artery stent graft 12/02/2009    DUPLEX EXAM:     RIGHT  LEFT   Peak Systolic Velocity (cm/s) Ratio (if abnormal) Waveform  Peak Systolic Velocity (cm/s) Ratio (if abnormal) Waveform     Artery - Proximal to Stent 28/0  T/occluded     Stent - Origin 0  occluded     Stent - Proximal 0  occluded     Stent - Mid 0  occluded     Stent - Distal 0  occluded      Stent - End 0  occluded     Artery - Distal to Stent 0/14  occluded/M  0.76/0.76 Today's ABI / TBI 0/0  0.81/0.52 Previous ABI / TBI (08/03/2013 ) 1.21/0.78    Waveform:    M - Monophasic       B - Biphasic       T - Triphasic  If Ankle Brachial Index (ABI) or Toe Brachial Index (TBI) performed, please see complete report  ADDITIONAL FINDINGS:     IMPRESSION: Left lower extremity arterial inflow involving the common femoral, proximal profunda femoral and proximal superficial femoral arteries are patent, no evidence of stenosis. No color or spectral Doppler waveforms could be obtained involving the left mid superficial femoral to distal popliteal artery as well as the left proximal posterior tibial, or left peroneal arteries suggesting vessel  occlusion. Minimal monophasic flow is noted involving the posterior tibial artery mid calf.    Compared to the previous exam:  Significant drop in left ankle brachial index as well as note made of acute occlusion.     ASSESSMENT: [name] [name] is a 68 y.o. male who presents status post endovascular repair of abdominal aortic aneurysm in October of 2011. He has a known persistent type II endoleak from a patent lumbar artery, however the size of his aneurysm sac continues to decrease. 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  He returns today with left calf puncture wound from thorny plant 6 days ago. Less pain in left calf today than 6 days ago, still has numbness in left foot. Dr. [name] checked pt's leg, he had concerns re the left cold foot with blanching. Left foot is blanched when elevated, no erythema in his left LE, healing small laceration at posterior left calf with no evidence of infection, no streaking. He denies fever or chills. ABI in LLE on 08/03/13 was normal at 1.21 with triphasic waveforms.  Today ABI is 0 in the LLE due to occlusion. Left  lower extremity arterial inflow involving the common femoral, proximal profunda femoral and proximal superficial femoral arteries are patent, no evidence of stenosis. No color or spectral Doppler waveforms could be obtained involving the left mid superficial femoral to distal popliteal artery as well as the left proximal posterior tibial, or left peroneal arteries suggesting vessel occlusion. Minimal monophasic flow is noted involving the posterior tibial artery mid calf.   PLAN:  I discussed in depth with the patient the nature of atherosclerosis, and emphasized the importance of maximal medical management including strict control of blood pressure, blood glucose, and lipid levels, obtaining regular exercise, and continued cessation of smoking.  The patient is aware that without maximal  medical management the underlying atherosclerotic disease process will progress, limiting the benefit of any interventions.  Based on the patient's vascular studies and examination, and after discussing with Dr. Edilia Bo,  pt will be scheduled for arteriogram with run off of the left lower extremity, with Dr. Imogene Burn for tomorrow; pt declined to wait until next week when Dr. Myra Gianotti is available.   The patient was given information about PAD including signs, symptoms, treatment, what symptoms should prompt the patient to seek immediate medical care, and risk reduction measures to take.  Charisse March, RN, MSN, FNP-C [facility] Phone: [phone]  Clinic MD: Edilia Bo  10/21/2013 9:42 AM

## 2013-10-22 ENCOUNTER — Encounter (HOSPITAL_COMMUNITY)
Admission: RE | Disposition: A | Discharge status: Home / Self Care | Payer: Self-pay | Source: Ambulatory Visit | Attending: Vascular Surgery

## 2013-10-22 ENCOUNTER — Ambulatory Visit (HOSPITAL_COMMUNITY)
Admission: RE | Admit: 2013-10-22 | Discharge: 2013-10-22 | Disposition: A | Discharge status: Home / Self Care | Payer: Medicare Other | Source: Ambulatory Visit | Attending: Vascular Surgery | Admitting: Vascular Surgery

## 2013-10-22 ENCOUNTER — Telehealth: Payer: Self-pay | Admitting: Surgery

## 2013-10-22 ENCOUNTER — Other Ambulatory Visit: Payer: Self-pay | Admitting: Radiology

## 2013-10-22 ENCOUNTER — Encounter (HOSPITAL_COMMUNITY): Payer: Self-pay | Admitting: Pharmacy Technician

## 2013-10-22 DIAGNOSIS — I10 Essential (primary) hypertension: Secondary | ICD-10-CM

## 2013-10-22 DIAGNOSIS — I2510 Atherosclerotic heart disease of native coronary artery without angina pectoris: Secondary | ICD-10-CM | POA: Diagnosis present

## 2013-10-22 DIAGNOSIS — Y848 Other medical procedures as the cause of abnormal reaction of the patient, or of later complication, without mention of misadventure at the time of the procedure: Secondary | ICD-10-CM | POA: Diagnosis present

## 2013-10-22 DIAGNOSIS — Z91041 Radiographic dye allergy status: Secondary | ICD-10-CM

## 2013-10-22 DIAGNOSIS — I70228 Atherosclerosis of native arteries of extremities with rest pain, other extremity: Secondary | ICD-10-CM | POA: Diagnosis present

## 2013-10-22 DIAGNOSIS — E785 Hyperlipidemia, unspecified: Secondary | ICD-10-CM | POA: Diagnosis present

## 2013-10-22 DIAGNOSIS — D62 Acute posthemorrhagic anemia: Secondary | ICD-10-CM | POA: Diagnosis not present

## 2013-10-22 DIAGNOSIS — Z9861 Coronary angioplasty status: Secondary | ICD-10-CM

## 2013-10-22 DIAGNOSIS — N401 Enlarged prostate with lower urinary tract symptoms: Secondary | ICD-10-CM | POA: Diagnosis present

## 2013-10-22 DIAGNOSIS — I252 Old myocardial infarction: Secondary | ICD-10-CM

## 2013-10-22 DIAGNOSIS — Z87891 Personal history of nicotine dependence: Secondary | ICD-10-CM

## 2013-10-22 DIAGNOSIS — R579 Shock, unspecified: Secondary | ICD-10-CM | POA: Diagnosis not present

## 2013-10-22 DIAGNOSIS — I998 Other disorder of circulatory system: Secondary | ICD-10-CM | POA: Diagnosis present

## 2013-10-22 DIAGNOSIS — Y92239 Unspecified place in hospital as the place of occurrence of the external cause: Secondary | ICD-10-CM

## 2013-10-22 DIAGNOSIS — I999 Unspecified disorder of circulatory system: Secondary | ICD-10-CM

## 2013-10-22 DIAGNOSIS — Z955 Presence of coronary angioplasty implant and graft: Secondary | ICD-10-CM

## 2013-10-22 DIAGNOSIS — Z8249 Family history of ischemic heart disease and other diseases of the circulatory system: Secondary | ICD-10-CM

## 2013-10-22 DIAGNOSIS — I743 Embolism and thrombosis of arteries of the lower extremities: Secondary | ICD-10-CM | POA: Diagnosis present

## 2013-10-22 DIAGNOSIS — R001 Bradycardia, unspecified: Secondary | ICD-10-CM | POA: Diagnosis not present

## 2013-10-22 DIAGNOSIS — I724 Aneurysm of artery of lower extremity: Secondary | ICD-10-CM | POA: Diagnosis not present

## 2013-10-22 DIAGNOSIS — Z7982 Long term (current) use of aspirin: Secondary | ICD-10-CM

## 2013-10-22 DIAGNOSIS — Y838 Other surgical procedures as the cause of abnormal reaction of the patient, or of later complication, without mention of misadventure at the time of the procedure: Secondary | ICD-10-CM | POA: Diagnosis not present

## 2013-10-22 DIAGNOSIS — I97618 Postprocedural hemorrhage and hematoma of a circulatory system organ or structure following other circulatory system procedure: Secondary | ICD-10-CM | POA: Diagnosis not present

## 2013-10-22 DIAGNOSIS — D65 Disseminated intravascular coagulation [defibrination syndrome]: Secondary | ICD-10-CM | POA: Diagnosis not present

## 2013-10-22 DIAGNOSIS — N189 Chronic kidney disease, unspecified: Secondary | ICD-10-CM | POA: Diagnosis present

## 2013-10-22 DIAGNOSIS — J9600 Acute respiratory failure, unspecified whether with hypoxia or hypercapnia: Secondary | ICD-10-CM | POA: Diagnosis not present

## 2013-10-22 DIAGNOSIS — I129 Hypertensive chronic kidney disease with stage 1 through stage 4 chronic kidney disease, or unspecified chronic kidney disease: Secondary | ICD-10-CM | POA: Diagnosis present

## 2013-10-22 DIAGNOSIS — N39498 Other specified urinary incontinence: Secondary | ICD-10-CM | POA: Diagnosis present

## 2013-10-22 DIAGNOSIS — R739 Hyperglycemia, unspecified: Secondary | ICD-10-CM | POA: Diagnosis not present

## 2013-10-22 DIAGNOSIS — I9589 Other hypotension: Secondary | ICD-10-CM | POA: Diagnosis not present

## 2013-10-22 DIAGNOSIS — T82858A Stenosis of vascular prosthetic devices, implants and grafts, initial encounter: Principal | ICD-10-CM | POA: Diagnosis present

## 2013-10-22 LAB — POCT I-STAT, CHEM 8
BUN: 18 mg/dL (ref 6–23)
CALCIUM ION: 1.2 mmol/L (ref 1.13–1.30)
CHLORIDE: 109 meq/L (ref 96–112)
CREATININE: 1.2 mg/dL (ref 0.50–1.35)
GLUCOSE: 106 mg/dL — AB (ref 70–99)
HCT: 47 % (ref 39.0–52.0)
Hemoglobin: 16 g/dL (ref 13.0–17.0)
Potassium: 4.1 mEq/L (ref 3.7–5.3)
Sodium: 140 mEq/L (ref 137–147)
TCO2: 23 mmol/L (ref 0–100)

## 2013-10-22 SURGERY — ANGIOGRAM EXTREMITY LEFT

## 2013-10-22 MED ORDER — FAMOTIDINE IN NACL 20-0.9 MG/50ML-% IV SOLN
INTRAVENOUS | Status: AC
  Filled 2013-10-22: qty 50

## 2013-10-22 MED ORDER — METHYLPREDNISOLONE SODIUM SUCC 125 MG IJ SOLR
INTRAMUSCULAR | Status: AC
  Filled 2013-10-22: qty 2

## 2013-10-22 MED ORDER — SODIUM CHLORIDE 0.9 % IV SOLN
INTRAVENOUS | Status: DC
  Administered 2013-10-22: 09:00:00 via INTRAVENOUS

## 2013-10-22 MED ORDER — LIDOCAINE HCL (PF) 1 % IJ SOLN
INTRAMUSCULAR | Status: AC
  Filled 2013-10-22: qty 30

## 2013-10-22 MED ORDER — FAMOTIDINE IN NACL 20-0.9 MG/50ML-% IV SOLN
20.0000 mg | INTRAVENOUS | Status: AC
  Administered 2013-10-22: 20 mg via INTRAVENOUS

## 2013-10-22 MED ORDER — DIPHENHYDRAMINE HCL 50 MG/ML IJ SOLN
INTRAMUSCULAR | Status: AC
  Filled 2013-10-22: qty 1

## 2013-10-22 MED ORDER — OXYCODONE-ACETAMINOPHEN 5-325 MG PO TABS: 1.0000 | ORAL_TABLET | ORAL | Status: DC | PRN

## 2013-10-22 MED ORDER — DIPHENHYDRAMINE HCL 50 MG/ML IJ SOLN
25.0000 mg | INTRAMUSCULAR | Status: AC
  Administered 2013-10-22: 25 mg via INTRAVENOUS

## 2013-10-22 MED ORDER — SODIUM CHLORIDE 0.9 % IV SOLN: 1.0000 mL/kg/h | INTRAVENOUS | Status: AC

## 2013-10-22 MED ORDER — METHYLPREDNISOLONE SODIUM SUCC 125 MG IJ SOLR
125.0000 mg | INTRAMUSCULAR | Status: AC
  Administered 2013-10-22: 125 mg via INTRAVENOUS

## 2013-10-22 MED ORDER — MORPHINE SULFATE 2 MG/ML IJ SOLN: 2.0000 mg | INTRAMUSCULAR | Status: DC | PRN

## 2013-10-22 MED ORDER — HEPARIN (PORCINE) IN NACL 2-0.9 UNIT/ML-% IJ SOLN
INTRAMUSCULAR | Status: AC
  Filled 2013-10-22: qty 1000

## 2013-10-22 NOTE — Progress Notes (Signed)
Pt states he is going to have a procedure tomorrow in radiology.  After speaking with Juliette Alcide in IR it was clarified that pt was to be in short stay at 7am for labs and an appointment in IR.  Pt given instructions and instructions  NPO after midnight except sip with am meds.  Pt prefers to leave his IV in for his am procedure so it was flushed and wrapped and pt taught what to do if IV came out.  Pt and wife verbalize understanding of instructions.

## 2013-10-22 NOTE — Progress Notes (Signed)
Report to relief:Sally Goin lunch relief

## 2013-10-22 NOTE — Op Note (Signed)
   OPERATIVE NOTE   PROCEDURE: 1.  Left common femoral artery cannulation under ultrasound guidance 2.  Left leg runoff  PRE-OPERATIVE DIAGNOSIS: Left foot ischemia  POST-OPERATIVE DIAGNOSIS: same as above   SURGEON: Leonides Sake, MD  ANESTHESIA: conscious sedation  ESTIMATED BLOOD LOSS: 30 cc  CONTRAST: 95 cc  FINDING(S):  Left  CFA patent  SFA Patent until mid-segment where it occludes 5 cm proximal to popliteal stent  PFA Patent  Pop Occluded  Trif Occluded  AT Occluded  Pero Occluded  PT Collateral perfuse distal posterior tibial artery but no flow to foot visualized   SPECIMEN(S):  none  INDICATIONS:   [name] [name] is a 68 y.o. male who presents with left foot ischemia with prior history of EVAR and endovascular stenting of a left popliteal aneurysm.  The patient presents for: Left leg runoff.  I discussed with the patient the nature of angiographic procedures, especially the limited patencies of any endovascular intervention.  The patient is aware of that the risks of an angiographic procedure include but are not limited to: bleeding, infection, access site complications, renal failure, embolization, rupture of vessel, dissection, possible need for emergent surgical intervention, possible need for surgical procedures to treat the patient's pathology, and stroke and death.  The patient is aware of the risks and agrees to proceed.  DESCRIPTION: After full informed consent was obtained from the patient, the patient was brought back to the angiography suite.  The patient was placed supine upon the angiography table and connected to monitoring equipment.  The patient was then given conscious sedation, the amounts of which are documented in the patient's chart.  The patient was prepped and drape in the standard fashion for an angiographic procedure.  At this point, attention was turned to the left groin.  Under ultrasound guidance, the left common femoral artery will be  cannulated with a 18 gauge needle.  The [facility] wire was passed up into the aorta.  The needle was exchanged for a 5-Fr sheath, which was advanced over the wire into the common femoral artery.  The dilator was then removed.  The sheath was connected to the power injector and left leg runoff was completed.  The findings are listed above.    COMPLICATIONS: none  CONDITION: stable  Leonides Sake, MD [facility] Office: [phone] Pager: [phone]  10/22/2013, 11:00 AM

## 2013-10-22 NOTE — Progress Notes (Signed)
Patient requesting to talk to Dr. Imogene Burn before removing sheath. Dr. Imogene Burn paged.

## 2013-10-22 NOTE — Discharge Instructions (Signed)
Angiogram, Care After °Refer to this sheet in the next few weeks. These instructions provide you with information on caring for yourself after your procedure. Your health care provider may also give you more specific instructions. Your treatment has been planned according to current medical practices, but problems sometimes occur. Call your health care provider if you have any problems or questions after your procedure.  °WHAT TO EXPECT AFTER THE PROCEDURE °After your procedure, it is typical to have the following sensations: °· Minor discomfort or tenderness and a small bump at the catheter insertion site. The bump should usually decrease in size and tenderness within 1 to 2 weeks. °· Any bruising will usually fade within 2 to 4 weeks. °HOME CARE INSTRUCTIONS  °· You may need to keep taking blood thinners if they were prescribed for you. Take medicines only as directed by your health care provider. °· Do not apply powder or lotion to the site. °· Do not take baths, swim, or use a hot tub until your health care provider approves. °· You may shower 24 hours after the procedure. Remove the bandage (dressing) and gently wash the site with plain soap and water. Gently pat the site dry. °· Inspect the site at least twice daily. °· Limit your activity for the first 48 hours. Do not bend, squat, or lift anything over 20 lb (9 kg) or as directed by your health care provider. °· Plan to have someone take you home after the procedure. Follow instructions about when you can drive or return to work. °SEEK MEDICAL CARE IF: °· You get light-headed when standing up. °· You have drainage (other than a small amount of blood on the dressing). °· You have chills. °· You have a fever. °· You have redness, warmth, swelling, or pain at the insertion site. °SEEK IMMEDIATE MEDICAL CARE IF:  °· You develop chest pain or shortness of breath, feel faint, or pass out. °· You have bleeding, swelling larger than a walnut, or drainage from the  catheter insertion site. °· You develop pain, discoloration, coldness, or severe bruising in the leg or arm that held the catheter. °· You have heavy bleeding from the site. If this happens, hold pressure on the site and call 911. °MAKE SURE YOU: °· Understand these instructions. °· Will watch your condition. °· Will get help right away if you are not doing well or get worse. °Document Released: 08/17/2004 Document Revised: 06/15/2013 Document Reviewed: 06/23/2012 °[employer]® Patient Information ©2015 [employer]. This information is not intended to replace advice given to you by your health care provider. Make sure you discuss any questions you have with your health care provider. ° °

## 2013-10-22 NOTE — Telephone Encounter (Signed)
Left msg for pt with appt date/time, dpm

## 2013-10-22 NOTE — H&P (View-Only) (Signed)
[facility] [place] HISTORY AND PHYSICAL -PAD  History of Present Illness [name] [name] is a 68 y.o. male patient of Dr. Brabham.  He returns today with left calf puncture wound from a thorny plant 6 days ago. Less pain in left calf today than 6 days ago, still has numbness in left foot. Dr. [name] checked pt's leg, he had concerns re the left cold foot with blanching. Pt denies fever or chills.   He is status post endovascular repair of abdominal aortic aneurysm in October of 2011. He has a known persistent type II endoleak from a patent lumbar artery, however the size of his aneurysm sac continues to decrease. 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  He denies abdominal or back pain.  He had a tooth extracted recently, he stopped the Plavix for this and did not resume, he does take a daily ASA, daily statin, and beta blocker.  He had an MI in 2007 with cardiac stent placement.  He denies any history of stroke or TIA.   Pt Diabetic: No  Pt smoker: former smoker, quit in 1988    Pt meds include: Statin :Yes Betablocker: Yes ASA: Yes Other anticoagulants/antiplatelets: Plavix  Past Medical History  Diagnosis Date  . Abdominal aortic aneurysm   . Popliteal aneurysm   . Hyperlipidemia   . Hypertension   . Coronary artery disease   . Detached retina   . Myocardial infarction     2007 heart stent  . Chronic kidney disease     Right kidney stone    Social History History  Substance Use Topics  . Smoking status: Former Smoker -- 15 years    Types: Cigarettes    Quit date: 09/21/1985  . Smokeless tobacco: Current User    Types: Chew  . Alcohol Use: No    Family History Family History  Problem Relation Age of Onset  . Aneurysm Sister     possibly   . Peripheral vascular disease Brother     Past Surgical History  Procedure Laterality Date  . Coronary stenting      2007  . Abdominal  aortic aneurysm repair  11/24/09    EVAR  . Popliteal artery stent  11/2009    left popliteal artery   . Retinal detachment surgery Right 2011  . Tooth extraction  June 2015    Wisdom Tooth Extraction    Allergies  Allergen Reactions  . Omnipaque [Iohexol] Rash    Pt requires full premeds and needs to bring a driver.  Broke out in rash on back and arms.  Ok w/ premeds  . Oxytetracycline Rash    Terramycin- RASH     Current Outpatient Prescriptions  Medication Sig Dispense Refill  . aspirin 81 MG EC tablet Take 81 mg by mouth daily.        . atorvastatin (LIPITOR) 40 MG tablet TAKE ONE TABLET BY MOUTH ONCE DAILY *NEEDS OFFICE VISIT*  30 tablet  5  . lisinopril (PRINIVIL,ZESTRIL) 20 MG tablet Take 20 mg by mouth daily with lunch.       . metoprolol tartrate (LOPRESSOR) 25 MG tablet Take 25 mg by mouth 2 (two) times daily.       . Omega-3 Fatty Acids (OMEGA-3 FISH OIL PO) Take 1,000 mg by mouth daily.       . Saw Palmetto, Serenoa repens, 450 MG CAPS Take 2 capsules by mouth 2 (two) times daily.       .   clopidogrel (PLAVIX) 75 MG tablet Take 1 tablet (75 mg total) by mouth daily.  30 tablet  6   No current facility-administered medications for this visit.    ROS: See HPI for pertinent positives and negatives.   Physical Examination  Filed Vitals:   10/21/13 0936  BP: 100/63  Pulse: 61  Temp: 97.7 F (36.5 C)  TempSrc: Oral  Resp: 14  Height: 6' 2" (1.88 m)  Weight: 206 lb (93.441 kg)  SpO2: 98%   Body mass index is 26.44 kg/(m^2).  General: A&O x 3, WD male  Pulmonary: Respirations are non labored, no cough. Cardiac: RRR  Vascular: Left foot is blanched when elevated, no erythema in his left LE, healing small laceration at posterior left calf with no evidence of infection, no streaking.  Vessel  Right  Left   Radial  2+Palpable  2+Palpable   Carotid  without bruit  without bruit   Aorta  Not palpable  N/A   Femoral  3+Palpable  2+Palpable   Popliteal  Not  palpable  Not palpable   PT  Not Palpable, faintly audible by Doppler Not audible by Doppler, was 2+ palpable in June, 2015  DP  Not Palpable, audible by Doppler, monophasic  Not Palpable in June, 2015, not audible by Doppler today   Gastrointestinal: soft, NTND, -G/R, - HSM, - masses, - CVAT B.  Musculoskeletal: M/S 5/5 throughout, see Vascular.  Neurologic: CN 2-12 grossly intact, Motor exam as listed above    Non-Invasive Vascular Imaging  EVAR Duplex (Date: 08/03/2013)  ABDOMINAL AORTA DUPLEX EVALUATION - POST ENDOVASCULAR REPAIR     INDICATION:  Follow-up aortic aneurysm stent repair     PREVIOUS INTERVENTION(S):  Endovascular aortic repair 11/24/2009     DUPLEX EXAM:       DIAMETER AP (cm)  DIAMETER TRANSVERSE (cm)  VELOCITIES (cm/sec)   Aorta  4.6  5.1  55   Right Common Iliac  1.3   51   Left Common Iliac  1.24   84     Comparison Study  Ultrasound      Date  DIAMETER AP (cm)  DIAMETER TRANSVERSE (cm)   07/28/2012  5.04  5.52      ADDITIONAL FINDINGS:      IMPRESSION:  Patent endovascular aortic repair with triphasic waveforms in the iliac limbs. Known endoleak could not be visualized due to bowel gas.     Compared to the previous exam:  No significant change compared to prior exam.    LOWER EXTREMITY ARTERIAL EVALUATION (Date: 08/03/2013)     INDICATION:  Follow-up left popliteal artery stent     PREVIOUS INTERVENTION(S):  Endovascular aortic repair 11/24/2009  Left popliteal artery stent for aneurysm repair 12/02/2009  Known small right popliteal aneurysm     DUPLEX EXAM:      RIGHT   LEFT   Peak Systolic Velocity (cm/s)  Ratio (if abnormal)  Waveform   Peak Systolic Velocity (cm/s)  Ratio (if abnormal)  Waveform      Artery - Proximal to Stent  65   T      Stent - Origin  53   T      Stent - Proximal  53   T      Stent - Mid  47   T      Stent - Distal  50   T      Stent - End  56   T      Artery -   Distal to Stent  50   T   0.81  Today's ABI / TBI   1.21   0.81  Previous ABI / TBI (12/03/2011 )  1.08     Waveform: M - Monophasic B - Biphasic T - Triphasic  If Ankle Brachial Index (ABI) or Toe Brachial Index (TBI) performed, please see complete report     ADDITIONAL FINDINGS:  There is a thrombosed saccular aneurysm observed in the distal superficial femoral artery with more fusiform dilation just beyond. Maximum diameter is 1.4cm.     IMPRESSION:  Patent left popliteal artery stent with evidence of minimal hyperplasia observed within the stent.     Compared to the previous exam:  No significant change compared to prior exam.      Non-Invasive Vascular Imaging: DATE: 10/21/2013 LOWER EXTREMITY ARTERIAL EVALUATION    INDICATION: Left lower extremity pain, cold, redness     PREVIOUS INTERVENTION(S): Left superficial femoral-popliteal artery stent graft 12/02/2009    DUPLEX EXAM:     RIGHT  LEFT   Peak Systolic Velocity (cm/s) Ratio (if abnormal) Waveform  Peak Systolic Velocity (cm/s) Ratio (if abnormal) Waveform     Artery - Proximal to Stent 28/0  T/occluded     Stent - Origin 0  occluded     Stent - Proximal 0  occluded     Stent - Mid 0  occluded     Stent - Distal 0  occluded      Stent - End 0  occluded     Artery - Distal to Stent 0/14  occluded/M  0.76/0.76 Today's ABI / TBI 0/0  0.81/0.52 Previous ABI / TBI (08/03/2013 ) 1.21/0.78    Waveform:    M - Monophasic       B - Biphasic       T - Triphasic  If Ankle Brachial Index (ABI) or Toe Brachial Index (TBI) performed, please see complete report  ADDITIONAL FINDINGS:     IMPRESSION: Left lower extremity arterial inflow involving the common femoral, proximal profunda femoral and proximal superficial femoral arteries are patent, no evidence of stenosis. No color or spectral Doppler waveforms could be obtained involving the left mid superficial femoral to distal popliteal artery as well as the left proximal posterior tibial, or left peroneal arteries suggesting vessel  occlusion. Minimal monophasic flow is noted involving the posterior tibial artery mid calf.    Compared to the previous exam:  Significant drop in left ankle brachial index as well as note made of acute occlusion.     ASSESSMENT: [name] [name] is a 68 y.o. male who presents status post endovascular repair of abdominal aortic aneurysm in October of 2011. He has a known persistent type II endoleak from a patent lumbar artery, however the size of his aneurysm sac continues to decrease. 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  He returns today with left calf puncture wound from thorny plant 6 days ago. Less pain in left calf today than 6 days ago, still has numbness in left foot. Dr. [name] checked pt's leg, he had concerns re the left cold foot with blanching. Left foot is blanched when elevated, no erythema in his left LE, healing small laceration at posterior left calf with no evidence of infection, no streaking. He denies fever or chills. ABI in LLE on 08/03/13 was normal at 1.21 with triphasic waveforms.  Today ABI is 0 in the LLE due to occlusion. Left   lower extremity arterial inflow involving the common femoral, proximal profunda femoral and proximal superficial femoral arteries are patent, no evidence of stenosis. No color or spectral Doppler waveforms could be obtained involving the left mid superficial femoral to distal popliteal artery as well as the left proximal posterior tibial, or left peroneal arteries suggesting vessel occlusion. Minimal monophasic flow is noted involving the posterior tibial artery mid calf.   PLAN:  I discussed in depth with the patient the nature of atherosclerosis, and emphasized the importance of maximal medical management including strict control of blood pressure, blood glucose, and lipid levels, obtaining regular exercise, and continued cessation of smoking.  The patient is aware that without maximal  medical management the underlying atherosclerotic disease process will progress, limiting the benefit of any interventions.  Based on the patient's vascular studies and examination, and after discussing with Dr. Dickson,  pt will be scheduled for arteriogram with run off of the left lower extremity, with Dr. Chen for tomorrow; pt declined to wait until next week when Dr. Brabham is available.   The patient was given information about PAD including signs, symptoms, treatment, what symptoms should prompt the patient to seek immediate medical care, and risk reduction measures to take.  [name] Nickel, RN, MSN, FNP-C [facility]  Office Phone: [phone]  Clinic MD: Dickson  10/21/2013 9:42 AM    

## 2013-10-22 NOTE — Telephone Encounter (Signed)
Message copied by Fredrich Birks on Thu Oct 22, 2013  3:23 PM ------      Message from: Lorin Mercy K      Created: Thu Oct 22, 2013 12:51 PM      Regarding: Schedule                   ----- Message -----         From: Fransisco Hertz, MD         Sent: 10/22/2013  11:04 AM           To: [facility] [address]            ALEC JAROS      [id number]      16-Jan-1946            PROCEDURE:      1.  Left common femoral artery cannulation under ultrasound guidance      2.  Left leg runoff            Follow-up: Dr. Myra Gianotti this coming Monday       ------

## 2013-10-22 NOTE — Progress Notes (Signed)
Site area: Left Groin Site Prior to Removal:  Level 0 Pressure Applied For: 20 minutes Manual:   yes Patient Status During Pull:  Stable Post Pull Site:  Level 0 Post Pull Instructions Given:  yes Post Pull Pulses Present: Absent pre and post. Dr. Imogene Burn aware Dressing Applied:  Tegaderm Bedrest begins @ 1210 Comments: No complications

## 2013-10-22 NOTE — Interval H&P Note (Signed)
[facility] [place]  History and Physical Update  The patient was interviewed and re-examined.  The patient's previous History and Physical has been reviewed and is unchanged from Dr. Adele Dan note.  The plan is left common femoral artery cannulation and left leg runoff.  Leonides Sake, MD [facility] [place] Office: [phone] Pager: [phone]  10/22/2013, 9:11 AM

## 2013-10-23 ENCOUNTER — Inpatient Hospital Stay (HOSPITAL_COMMUNITY)
Admission: AD | Admit: 2013-10-23 | Discharge: 2013-11-16 | DRG: 252 | Disposition: A | Discharge status: Home Health | Payer: Medicare Other | Source: Ambulatory Visit | Attending: Surgery | Admitting: Surgery

## 2013-10-23 ENCOUNTER — Other Ambulatory Visit: Payer: Self-pay | Admitting: Surgery

## 2013-10-23 ENCOUNTER — Inpatient Hospital Stay: Admit: 2013-10-23 | Payer: Self-pay | Admitting: Surgery

## 2013-10-23 ENCOUNTER — Encounter (HOSPITAL_COMMUNITY): Payer: Self-pay

## 2013-10-23 ENCOUNTER — Encounter: Payer: Self-pay | Admitting: Surgery

## 2013-10-23 ENCOUNTER — Ambulatory Visit (HOSPITAL_COMMUNITY)
Admit: 2013-10-23 | Discharge: 2013-10-23 | Disposition: A | Discharge status: Home / Self Care | Payer: Medicare Other | Attending: Surgery | Admitting: Surgery

## 2013-10-23 ENCOUNTER — Other Ambulatory Visit: Payer: Self-pay | Admitting: Radiology

## 2013-10-23 VITALS — BP 113/55 | HR 62 | Temp 97.8°F | Resp 18 | Ht 74.0 in | Wt 208.0 lb

## 2013-10-23 VITALS — BP 133/71 | HR 78 | Temp 98.5°F | Resp 18 | Ht 74.02 in | Wt 195.3 lb

## 2013-10-23 DIAGNOSIS — K8020 Calculus of gallbladder without cholecystitis without obstruction: Secondary | ICD-10-CM | POA: Diagnosis not present

## 2013-10-23 DIAGNOSIS — Y848 Other medical procedures as the cause of abnormal reaction of the patient, or of later complication, without mention of misadventure at the time of the procedure: Secondary | ICD-10-CM | POA: Diagnosis present

## 2013-10-23 DIAGNOSIS — I999 Unspecified disorder of circulatory system: Secondary | ICD-10-CM | POA: Diagnosis not present

## 2013-10-23 DIAGNOSIS — I70202 Unspecified atherosclerosis of native arteries of extremities, left leg: Secondary | ICD-10-CM | POA: Diagnosis present

## 2013-10-23 DIAGNOSIS — I714 Abdominal aortic aneurysm, without rupture, unspecified: Secondary | ICD-10-CM | POA: Diagnosis not present

## 2013-10-23 DIAGNOSIS — I129 Hypertensive chronic kidney disease with stage 1 through stage 4 chronic kidney disease, or unspecified chronic kidney disease: Secondary | ICD-10-CM | POA: Diagnosis present

## 2013-10-23 DIAGNOSIS — I9589 Other hypotension: Secondary | ICD-10-CM | POA: Diagnosis not present

## 2013-10-23 DIAGNOSIS — R4182 Altered mental status, unspecified: Secondary | ICD-10-CM | POA: Diagnosis not present

## 2013-10-23 DIAGNOSIS — T82858A Stenosis of vascular prosthetic devices, implants and grafts, initial encounter: Secondary | ICD-10-CM | POA: Diagnosis present

## 2013-10-23 DIAGNOSIS — Y92239 Unspecified place in hospital as the place of occurrence of the external cause: Secondary | ICD-10-CM | POA: Diagnosis not present

## 2013-10-23 DIAGNOSIS — I724 Aneurysm of artery of lower extremity: Secondary | ICD-10-CM

## 2013-10-23 DIAGNOSIS — Z01810 Encounter for preprocedural cardiovascular examination: Secondary | ICD-10-CM | POA: Diagnosis not present

## 2013-10-23 DIAGNOSIS — N189 Chronic kidney disease, unspecified: Secondary | ICD-10-CM | POA: Diagnosis present

## 2013-10-23 DIAGNOSIS — N39498 Other specified urinary incontinence: Secondary | ICD-10-CM | POA: Diagnosis present

## 2013-10-23 DIAGNOSIS — I998 Other disorder of circulatory system: Secondary | ICD-10-CM

## 2013-10-23 DIAGNOSIS — Z5189 Encounter for other specified aftercare: Secondary | ICD-10-CM | POA: Diagnosis not present

## 2013-10-23 DIAGNOSIS — I2510 Atherosclerotic heart disease of native coronary artery without angina pectoris: Secondary | ICD-10-CM | POA: Diagnosis present

## 2013-10-23 DIAGNOSIS — N400 Enlarged prostate without lower urinary tract symptoms: Secondary | ICD-10-CM | POA: Diagnosis not present

## 2013-10-23 DIAGNOSIS — I70228 Atherosclerosis of native arteries of extremities with rest pain, other extremity: Secondary | ICD-10-CM | POA: Diagnosis present

## 2013-10-23 DIAGNOSIS — J9600 Acute respiratory failure, unspecified whether with hypoxia or hypercapnia: Secondary | ICD-10-CM | POA: Diagnosis not present

## 2013-10-23 DIAGNOSIS — IMO0002 Reserved for concepts with insufficient information to code with codable children: Secondary | ICD-10-CM | POA: Diagnosis not present

## 2013-10-23 DIAGNOSIS — I252 Old myocardial infarction: Secondary | ICD-10-CM | POA: Diagnosis not present

## 2013-10-23 DIAGNOSIS — D62 Acute posthemorrhagic anemia: Secondary | ICD-10-CM | POA: Diagnosis not present

## 2013-10-23 DIAGNOSIS — J9601 Acute respiratory failure with hypoxia: Secondary | ICD-10-CM

## 2013-10-23 DIAGNOSIS — I743 Embolism and thrombosis of arteries of the lower extremities: Secondary | ICD-10-CM | POA: Diagnosis not present

## 2013-10-23 DIAGNOSIS — J9819 Other pulmonary collapse: Secondary | ICD-10-CM | POA: Diagnosis not present

## 2013-10-23 DIAGNOSIS — I739 Peripheral vascular disease, unspecified: Secondary | ICD-10-CM

## 2013-10-23 DIAGNOSIS — Z7982 Long term (current) use of aspirin: Secondary | ICD-10-CM | POA: Diagnosis not present

## 2013-10-23 DIAGNOSIS — Z452 Encounter for adjustment and management of vascular access device: Secondary | ICD-10-CM | POA: Diagnosis not present

## 2013-10-23 DIAGNOSIS — I97618 Postprocedural hemorrhage and hematoma of a circulatory system organ or structure following other circulatory system procedure: Secondary | ICD-10-CM | POA: Diagnosis not present

## 2013-10-23 DIAGNOSIS — Z955 Presence of coronary angioplasty implant and graft: Secondary | ICD-10-CM | POA: Diagnosis not present

## 2013-10-23 DIAGNOSIS — Z4682 Encounter for fitting and adjustment of non-vascular catheter: Secondary | ICD-10-CM | POA: Diagnosis not present

## 2013-10-23 DIAGNOSIS — I517 Cardiomegaly: Secondary | ICD-10-CM | POA: Diagnosis not present

## 2013-10-23 DIAGNOSIS — Y838 Other surgical procedures as the cause of abnormal reaction of the patient, or of later complication, without mention of misadventure at the time of the procedure: Secondary | ICD-10-CM | POA: Diagnosis not present

## 2013-10-23 DIAGNOSIS — R739 Hyperglycemia, unspecified: Secondary | ICD-10-CM | POA: Diagnosis not present

## 2013-10-23 DIAGNOSIS — Z8249 Family history of ischemic heart disease and other diseases of the circulatory system: Secondary | ICD-10-CM | POA: Diagnosis not present

## 2013-10-23 DIAGNOSIS — T82898A Other specified complication of vascular prosthetic devices, implants and grafts, initial encounter: Secondary | ICD-10-CM | POA: Diagnosis not present

## 2013-10-23 DIAGNOSIS — E785 Hyperlipidemia, unspecified: Secondary | ICD-10-CM | POA: Diagnosis present

## 2013-10-23 DIAGNOSIS — I498 Other specified cardiac arrhythmias: Secondary | ICD-10-CM | POA: Diagnosis not present

## 2013-10-23 DIAGNOSIS — R918 Other nonspecific abnormal finding of lung field: Secondary | ICD-10-CM | POA: Diagnosis not present

## 2013-10-23 DIAGNOSIS — I959 Hypotension, unspecified: Secondary | ICD-10-CM | POA: Diagnosis not present

## 2013-10-23 DIAGNOSIS — R579 Shock, unspecified: Secondary | ICD-10-CM | POA: Diagnosis not present

## 2013-10-23 DIAGNOSIS — Z87891 Personal history of nicotine dependence: Secondary | ICD-10-CM | POA: Diagnosis not present

## 2013-10-23 DIAGNOSIS — N401 Enlarged prostate with lower urinary tract symptoms: Secondary | ICD-10-CM | POA: Diagnosis present

## 2013-10-23 DIAGNOSIS — D65 Disseminated intravascular coagulation [defibrination syndrome]: Secondary | ICD-10-CM | POA: Diagnosis not present

## 2013-10-23 DIAGNOSIS — R001 Bradycardia, unspecified: Secondary | ICD-10-CM | POA: Diagnosis not present

## 2013-10-23 DIAGNOSIS — Z48812 Encounter for surgical aftercare following surgery on the circulatory system: Secondary | ICD-10-CM | POA: Diagnosis not present

## 2013-10-23 DIAGNOSIS — Z91041 Radiographic dye allergy status: Secondary | ICD-10-CM | POA: Diagnosis not present

## 2013-10-23 DIAGNOSIS — I7102 Dissection of abdominal aorta: Secondary | ICD-10-CM | POA: Diagnosis not present

## 2013-10-23 LAB — CBC
HCT: 43.6 % (ref 39.0–52.0)
HCT: 44.6 % (ref 39.0–52.0)
HEMOGLOBIN: 15.2 g/dL (ref 13.0–17.0)
HEMOGLOBIN: 15.1 g/dL (ref 13.0–17.0)
MCH: 32.2 pg (ref 26.0–34.0)
MCH: 32.1 pg (ref 26.0–34.0)
MCHC: 34.9 g/dL (ref 30.0–36.0)
MCHC: 33.9 g/dL (ref 30.0–36.0)
MCV: 92.4 fL (ref 78.0–100.0)
MCV: 94.9 fL (ref 78.0–100.0)
Platelets: 239 10*3/uL (ref 150–400)
Platelets: 196 10*3/uL (ref 150–400)
RBC: 4.72 MIL/uL (ref 4.22–5.81)
RBC: 4.7 MIL/uL (ref 4.22–5.81)
RDW: 12.8 % (ref 11.5–15.5)
RDW: 12.8 % (ref 11.5–15.5)
WBC: 16.3 10*3/uL — ABNORMAL HIGH (ref 4.0–10.5)
WBC: 14 10*3/uL — ABNORMAL HIGH (ref 4.0–10.5)

## 2013-10-23 LAB — COMPREHENSIVE METABOLIC PANEL
ALT: 25 U/L (ref 0–53)
ANION GAP: 14 (ref 5–15)
AST: 25 U/L (ref 0–37)
Albumin: 3.8 g/dL (ref 3.5–5.2)
Alkaline Phosphatase: 66 U/L (ref 39–117)
BUN: 20 mg/dL (ref 6–23)
CALCIUM: 9.4 mg/dL (ref 8.4–10.5)
CO2: 20 mEq/L (ref 19–32)
Chloride: 107 mEq/L (ref 96–112)
Creatinine, Ser: 1.11 mg/dL (ref 0.50–1.35)
GFR calc Af Amer: 76 mL/min — ABNORMAL LOW (ref >90)
GFR calc non Af Amer: 65 mL/min — ABNORMAL LOW (ref >90)
GLUCOSE: 150 mg/dL — AB (ref 70–99)
Potassium: 4.6 mEq/L (ref 3.7–5.3)
SODIUM: 141 meq/L (ref 137–147)
TOTAL PROTEIN: 7 g/dL (ref 6.0–8.3)
Total Bilirubin: 1.9 mg/dL — ABNORMAL HIGH (ref 0.3–1.2)

## 2013-10-23 LAB — MRSA PCR SCREENING: MRSA by PCR: NEGATIVE

## 2013-10-23 LAB — FIBRINOGEN
FIBRINOGEN: 74 mg/dL — AB (ref 204–475)
Fibrinogen: 335 mg/dL (ref 204–475)

## 2013-10-23 LAB — PROTIME-INR
INR: 1.04 (ref 0.00–1.49)
INR: 1.28 (ref 0.00–1.49)
PROTHROMBIN TIME: 13.6 s (ref 11.6–15.2)
Prothrombin Time: 16 seconds — ABNORMAL HIGH (ref 11.6–15.2)

## 2013-10-23 LAB — HEPARIN LEVEL (UNFRACTIONATED)
Heparin Unfractionated: <0.1 IU/mL — ABNORMAL LOW (ref 0.30–0.70)
Heparin Unfractionated: 0.11 IU/mL — ABNORMAL LOW (ref 0.30–0.70)

## 2013-10-23 MED ORDER — ATORVASTATIN CALCIUM 40 MG PO TABS
40.0000 mg | ORAL_TABLET | Freq: Every day | ORAL | Status: DC
Start: 2013-10-23 — End: 2013-10-23

## 2013-10-23 MED ORDER — NALOXONE HCL 0.4 MG/ML IJ SOLN: 0.4000 mg | INTRAMUSCULAR | Status: DC | PRN

## 2013-10-23 MED ORDER — ACETAMINOPHEN 325 MG RE SUPP
325.0000 mg | RECTAL | Status: DC | PRN
  Filled 2013-10-23: qty 2

## 2013-10-23 MED ORDER — MORPHINE SULFATE 2 MG/ML IJ SOLN
2.0000 mg | INTRAMUSCULAR | Status: DC | PRN
  Administered 2013-10-23: 2 mg via INTRAVENOUS
  Administered 2013-10-23: 5 mg via INTRAVENOUS
  Filled 2013-10-23: qty 3

## 2013-10-23 MED ORDER — METOPROLOL TARTRATE 25 MG PO TABS
25.0000 mg | ORAL_TABLET | Freq: Two times a day (BID) | ORAL | Status: DC
  Filled 2013-10-23 (×3): qty 1

## 2013-10-23 MED ORDER — SODIUM CHLORIDE 0.9 % IJ SOLN
3.0000 mL | Freq: Two times a day (BID) | INTRAMUSCULAR | Status: DC
  Administered 2013-10-23 (×2): 3 mL via INTRAVENOUS

## 2013-10-23 MED ORDER — SODIUM CHLORIDE 0.9 % IV SOLN: 250.0000 mL | INTRAVENOUS | Status: DC | PRN

## 2013-10-23 MED ORDER — SODIUM CHLORIDE 0.9 % IV SOLN
INTRAVENOUS | Status: DC
  Administered 2013-10-23: 07:00:00 via INTRAVENOUS

## 2013-10-23 MED ORDER — DIPHENHYDRAMINE HCL 50 MG/ML IJ SOLN: 12.5000 mg | Freq: Four times a day (QID) | INTRAMUSCULAR | Status: DC | PRN

## 2013-10-23 MED ORDER — FAMOTIDINE IN NACL 20-0.9 MG/50ML-% IV SOLN
INTRAVENOUS | Status: AC
  Filled 2013-10-23: qty 50

## 2013-10-23 MED ORDER — ACETAMINOPHEN 325 MG PO TABS: 325.0000 mg | ORAL_TABLET | ORAL | Status: DC | PRN

## 2013-10-23 MED ORDER — METOPROLOL TARTRATE 1 MG/ML IV SOLN: 2.0000 mg | INTRAVENOUS | Status: DC | PRN

## 2013-10-23 MED ORDER — MIDAZOLAM HCL 2 MG/2ML IJ SOLN
INTRAMUSCULAR | Status: AC | PRN
  Administered 2013-10-23: 1 mg via INTRAVENOUS

## 2013-10-23 MED ORDER — DIPHENHYDRAMINE HCL 50 MG/ML IJ SOLN
50.0000 mg | Freq: Once | INTRAMUSCULAR | Status: AC
  Administered 2013-10-23: 50 mg via INTRAVENOUS

## 2013-10-23 MED ORDER — HEPARIN (PORCINE) IN NACL 100-0.45 UNIT/ML-% IJ SOLN
800.0000 [IU]/h | INTRAMUSCULAR | Status: DC
  Administered 2013-10-23: 800 [IU]/h via INTRAVENOUS
  Filled 2013-10-23: qty 250

## 2013-10-23 MED ORDER — DIPHENHYDRAMINE HCL 50 MG PO CAPS
50.0000 mg | ORAL_CAPSULE | Freq: Once | ORAL | Status: DC
  Filled 2013-10-23: qty 1

## 2013-10-23 MED ORDER — MIDAZOLAM HCL 2 MG/2ML IJ SOLN
INTRAMUSCULAR | Status: AC
  Filled 2013-10-23: qty 4

## 2013-10-23 MED ORDER — IODIXANOL 320 MG/ML IV SOLN
100.0000 mL | Freq: Once | INTRAVENOUS | Status: AC | PRN
  Administered 2013-10-23: 20 mL via INTRAVENOUS

## 2013-10-23 MED ORDER — FENTANYL CITRATE 0.05 MG/ML IJ SOLN
INTRAMUSCULAR | Status: AC | PRN
Start: 2013-10-23 — End: 2013-10-23
  Administered 2013-10-23: 25 ug via INTRAVENOUS

## 2013-10-23 MED ORDER — PREDNISONE 50 MG PO TABS
50.0000 mg | ORAL_TABLET | Freq: Four times a day (QID) | ORAL | Status: DC
  Filled 2013-10-23 (×3): qty 1

## 2013-10-23 MED ORDER — MIDAZOLAM HCL 2 MG/2ML IJ SOLN
1.0000 mg | INTRAMUSCULAR | Status: DC | PRN
  Administered 2013-10-24: 1 mg via INTRAVENOUS
  Filled 2013-10-23: qty 2

## 2013-10-23 MED ORDER — DIPHENHYDRAMINE HCL 50 MG/ML IJ SOLN
INTRAMUSCULAR | Status: AC
  Administered 2013-10-23: 50 mg via INTRAVENOUS
  Filled 2013-10-23: qty 1

## 2013-10-23 MED ORDER — LISINOPRIL 20 MG PO TABS: 20.0000 mg | ORAL_TABLET | Freq: Every day | ORAL | Status: DC

## 2013-10-23 MED ORDER — PREDNISONE 50 MG PO TABS
50.0000 mg | ORAL_TABLET | Freq: Four times a day (QID) | ORAL | Status: AC
  Administered 2013-10-23 – 2013-10-24 (×2): 50 mg via ORAL
  Filled 2013-10-23 (×3): qty 1

## 2013-10-23 MED ORDER — MORPHINE SULFATE 4 MG/ML IJ SOLN: 5.0000 mg | INTRAMUSCULAR | Status: DC | PRN

## 2013-10-23 MED ORDER — FENTANYL CITRATE 0.05 MG/ML IJ SOLN
INTRAMUSCULAR | Status: AC
  Filled 2013-10-23: qty 4

## 2013-10-23 MED ORDER — TENECTEPLASE 50 MG IV KIT
0.2500 mg/h | PACK | INTRAVENOUS | Status: DC
  Administered 2013-10-23: 0.5 mg/h via INTRA_ARTERIAL
  Administered 2013-10-24: 0.25 mg/h via INTRA_ARTERIAL
  Filled 2013-10-23 (×2): qty 1

## 2013-10-23 MED ORDER — PREDNISONE 50 MG PO TABS: 50.0000 mg | ORAL_TABLET | Freq: Four times a day (QID) | ORAL | Status: DC

## 2013-10-23 MED ORDER — ONDANSETRON HCL 4 MG/2ML IJ SOLN: 4.0000 mg | Freq: Four times a day (QID) | INTRAMUSCULAR | Status: DC | PRN

## 2013-10-23 MED ORDER — PANTOPRAZOLE SODIUM 40 MG PO TBEC: 40.0000 mg | DELAYED_RELEASE_TABLET | Freq: Every day | ORAL | Status: DC

## 2013-10-23 MED ORDER — METOPROLOL TARTRATE 25 MG PO TABS: 25.0000 mg | ORAL_TABLET | Freq: Two times a day (BID) | ORAL | Status: DC

## 2013-10-23 MED ORDER — HYDRALAZINE HCL 20 MG/ML IJ SOLN: 10.0000 mg | INTRAMUSCULAR | Status: DC | PRN

## 2013-10-23 MED ORDER — IBUPROFEN 400 MG PO TABS
400.0000 mg | ORAL_TABLET | ORAL | Status: DC | PRN
  Filled 2013-10-23: qty 1

## 2013-10-23 MED ORDER — METHYLPREDNISOLONE SODIUM SUCC 125 MG IJ SOLR
INTRAMUSCULAR | Status: AC
  Administered 2013-10-23: 125 mg via INTRAVENOUS
  Filled 2013-10-23: qty 2

## 2013-10-23 MED ORDER — DIPHENHYDRAMINE HCL 12.5 MG/5ML PO ELIX
12.5000 mg | ORAL_SOLUTION | Freq: Four times a day (QID) | ORAL | Status: DC | PRN
  Filled 2013-10-23: qty 5

## 2013-10-23 MED ORDER — SODIUM CHLORIDE 0.9 % IJ SOLN: 3.0000 mL | INTRAMUSCULAR | Status: DC | PRN

## 2013-10-23 MED ORDER — SODIUM CHLORIDE 0.9 % IJ SOLN
3.0000 mL | Freq: Two times a day (BID) | INTRAMUSCULAR | Status: DC
  Administered 2013-10-23 – 2013-10-28 (×8): 3 mL via INTRAVENOUS

## 2013-10-23 MED ORDER — ALUM & MAG HYDROXIDE-SIMETH 200-200-20 MG/5ML PO SUSP: 15.0000 mL | ORAL | Status: DC | PRN

## 2013-10-23 MED ORDER — DIPHENHYDRAMINE HCL 50 MG PO CAPS: 50.0000 mg | ORAL_CAPSULE | Freq: Once | ORAL | Status: DC

## 2013-10-23 MED ORDER — LISINOPRIL 20 MG PO TABS
20.0000 mg | ORAL_TABLET | Freq: Every day | ORAL | Status: DC
  Filled 2013-10-23 (×2): qty 1

## 2013-10-23 MED ORDER — IOHEXOL 300 MG/ML  SOLN: 100.0000 mL | Freq: Once | INTRAMUSCULAR | Status: DC | PRN

## 2013-10-23 MED ORDER — OXYCODONE-ACETAMINOPHEN 5-325 MG PO TABS: 1.0000 | ORAL_TABLET | ORAL | Status: DC | PRN

## 2013-10-23 MED ORDER — POTASSIUM CHLORIDE CRYS ER 20 MEQ PO TBCR
20.0000 meq | EXTENDED_RELEASE_TABLET | Freq: Once | ORAL | Status: DC
Start: 2013-10-23 — End: 2013-10-29

## 2013-10-23 MED ORDER — HEPARIN (PORCINE) IN NACL 100-0.45 UNIT/ML-% IJ SOLN
1000.0000 [IU]/h | INTRAMUSCULAR | Status: DC
  Filled 2013-10-23: qty 250

## 2013-10-23 MED ORDER — HYDROMORPHONE 0.3 MG/ML IV SOLN
INTRAVENOUS | Status: DC
  Administered 2013-10-23: 1.1 mg via INTRAVENOUS
  Administered 2013-10-23: 17:00:00 via INTRAVENOUS
  Administered 2013-10-24: 0.3 mg via INTRAVENOUS
  Filled 2013-10-23: qty 25

## 2013-10-23 MED ORDER — METHYLPREDNISOLONE SODIUM SUCC 125 MG IJ SOLR
125.0000 mg | Freq: Once | INTRAMUSCULAR | Status: AC
  Administered 2013-10-23: 125 mg via INTRAVENOUS

## 2013-10-23 MED ORDER — ATORVASTATIN CALCIUM 40 MG PO TABS
40.0000 mg | ORAL_TABLET | Freq: Every day | ORAL | Status: DC
  Filled 2013-10-23 (×2): qty 1

## 2013-10-23 MED ORDER — GUAIFENESIN-DM 100-10 MG/5ML PO SYRP: 15.0000 mL | ORAL_SOLUTION | ORAL | Status: DC | PRN

## 2013-10-23 MED ORDER — LIDOCAINE HCL 1 % IJ SOLN
INTRAMUSCULAR | Status: AC
  Filled 2013-10-23: qty 20

## 2013-10-23 MED ORDER — SODIUM CHLORIDE 0.9 % IJ SOLN: 9.0000 mL | INTRAMUSCULAR | Status: DC | PRN

## 2013-10-23 MED ORDER — LABETALOL HCL 5 MG/ML IV SOLN: 10.0000 mg | INTRAVENOUS | Status: DC | PRN

## 2013-10-23 MED ORDER — PHENOL 1.4 % MT LIQD: 1.0000 | OROMUCOSAL | Status: DC | PRN

## 2013-10-23 NOTE — Sedation Documentation (Addendum)
Pulses 1 + dp and pt rt foot. Thready with doppler on left foot,  Foot also cold to touch and extremely pale Dr Miles Costain notified no new orders.

## 2013-10-23 NOTE — Procedures (Signed)
Successful ANTEGRADE LLE angio and infusion cath insertion across occluded popliteal stent into the posterior tibial artery No comp Stable Full report in PACS BEGIN TNK LYSIS WITH F/U TOMORROW

## 2013-10-23 NOTE — Progress Notes (Signed)
   Daily Progress Note  Assessment/Planning: POD #0 s/p placement of thrombolytic catheter L leg   Some evidence of active lysis and L calf is warm now and pink  L foot remains ischemic but pain better than earlier  Continue lysis  Subjective  - Day of Surgery  Pain better  Objective Filed Vitals:   10/23/13 2000 10/23/13 2100 10/23/13 2200 10/23/13 2300  BP: 111/63 99/55 91/53  102/57  Pulse: 62 59 56 56  Temp: 98.3 F (36.8 C)     TempSrc: Oral     Resp: Height:      Weight:      SpO2: 92% 92% 92% 93%    Intake/Output Summary (Last 24 hours) at 10/23/13 2319 Last data filed at 10/23/13 2300  Gross per 24 hour  Intake    682 ml  Output    800 ml  Net   -118 ml    VASC no bleeding from sheath, L calf more pink that previous with some warmth now, foot remains ischemic in appearance.  Laboratory CBC    Component Value Date/Time   WBC 14.0* 10/23/2013 1524   HGB 15.1 10/23/2013 1524   HCT 44.6 10/23/2013 1524   PLT 196 10/23/2013 1524    BMET    Component Value Date/Time   NA 141 10/23/2013 1524   K 4.6 10/23/2013 1524   CL 107 10/23/2013 1524   CO2 20 10/23/2013 1524   GLUCOSE 150* 10/23/2013 1524   BUN 20 10/23/2013 1524   CREATININE 1.11 10/23/2013 1524   CALCIUM 9.4 10/23/2013 1524   GFRNONAA 65* 10/23/2013 1524   GFRAA 76* 10/23/2013 1524    Leonides Sake, MD [facility] [place] Office: [phone] Pager: [phone]  10/23/2013, 11:19 PM

## 2013-10-23 NOTE — Progress Notes (Signed)
Lianne Cure PA  Made aware of patients persisting pain despite  of morphine. Pt still rating pain as a 10/10 and stating that his leg pain is "the worst he's ever had". L leg is mottled, pulseless and cold to touch. MD Imogene Burn and Lianne Cure PA aware- new orders received and to bedside to assess.   Pt refused to take pills sent from hospital pharmacy, saying "I have my own from home". Pt educated on need to send down his medications to pharmacy for verification. Pt refused and took his own medication despite of education. Pt educated that I didn't know for sure what was in the bottles and I wanted to make sure that he took the appropriate medication. I documented not given for the medication since he refused to take what was on my worklist- stating "that's what I just took"  Felipa Emory

## 2013-10-23 NOTE — Progress Notes (Addendum)
ANTICOAGULATION CONSULT NOTE - Initial Consult  Pharmacy Consult for Heparin  Indication: VTE treatment  Allergies  Allergen Reactions  . Omnipaque [Iohexol] Rash    Pt requires full premeds and needs to bring a driver.  Broke out in rash on back and arms.  Ok w/ premeds  . Oxytetracycline Rash    Terramycin- RASH     Patient Measurements: Height: 6' 2.02" (188 cm) Weight: 207 lb 14.3 oz (94.3 kg) IBW/kg (Calculated) : 82.24 Heparin Dosing Weight: 94.3 kg  Vital Signs: Temp: 97.8 F (36.6 C) (09/11 0705) Temp src: Oral (09/11 0705) BP: 110/59 mmHg (09/11 1305) Pulse Rate: 71 (09/11 1305)  Labs:  Recent Labs  10/22/13 0918 10/23/13 0655  HGB 16.0 15.2  HCT 47.0 43.6  PLT  --  239  LABPROT  --  13.6  INR  --  1.04  CREATININE 1.20  --     Estimated Creatinine Clearance: 66.6 ml/min (by C-G formula based on Cr of 1.2).   Medical History: Past Medical History  Diagnosis Date  . Abdominal aortic aneurysm   . Popliteal aneurysm   . Hyperlipidemia   . Hypertension   . Coronary artery disease   . Detached retina   . Myocardial infarction     2007 heart stent  . Chronic kidney disease     Right kidney stone    Assessment: 70 YOM with LLE limb ischemia s/p arteriogram and thrombectomy with TNKase started at noon. Pharmacy is consulted manage IV heparin with lower therapeutic goal (0.2-0.5). Cbc, baseline PT/INR, aPTT wnl. Initial heparin rate 800 units/hr  Goal of Therapy:  Heparin level 0.2-0.5 units/ml (while on TNKase)  Monitor platelets by anticoagulation protocol: Yes   Plan:  Heparin infusion 800 units/hr F/u 8 hr heparin level at 2030 Daily heparin level and CBC  Bayard Hugger, PharmD, BCPS  Clinical Pharmacist  Pager: [phone]   10/23/2013,2:36 PM    =========================================   Addendum: - HL 0.11 - no bleeding no complication with infusion reported - continues on intra-arterial TNKase   Plan: - Increase heparin gtt to  1000 units/hr - Check 8 hr HL post rate adjustment    Ramez Arrona D. Laney Potash, PharmD, BCPS Pager:  [phone] 10/23/2013, 10:09 PM

## 2013-10-23 NOTE — H&P (Signed)
Reason for Consult: "Left lower extremity limb ischemia."   Referring Physician(s): Brabham,Vance W  History of Present Illness: [name] [name] is a 68 y.o. male with history of popliteal aneurysm s/p left popliteal stent placed 2011, history of AAA, CAD, MI, HTN. He states approximately 1 week ago he started noticing pain and temperature change in his LLE and was out in garden and had a stick puncture his calf and a rash appear. He was evaluated by [place] on 9/9 and LLE arteriogram on 9/10 findings concerning for popliteal occlusion proximal to stent. Vascular has requested IR to perform arteriogram and possible thrombolysis. The patient denies any bleeding or open skin wounds. He denies any history of recent surgery, denies any history of cancer, brain tumor or hemorrhagic stroke. He denies any history of internal bleeding and denies taking any blood thinner. He does admit to an allergy to iodinated contrast which causes an extremity rash, he denies any history of facial hives or swelling or difficulty breathing. He denies any history of diabetes or kidney disease. He denies any chest pain, shortness of breath or palpitations. He denies any active signs of bleeding or excessive bruising. The patient denies any history of sleep apnea or chronic oxygen use. He has previously tolerated sedation without complications.   Past Medical History  Diagnosis Date  . Abdominal aortic aneurysm   . Popliteal aneurysm   . Hyperlipidemia   . Hypertension   . Coronary artery disease   . Detached retina   . Myocardial infarction     2007 heart stent  . Chronic kidney disease     Right kidney stone    Past Surgical History  Procedure Laterality Date  . Coronary stenting      2007  . Abdominal aortic aneurysm repair  11/24/09    EVAR  . Popliteal artery stent  11/2009    left popliteal artery   . Retinal detachment surgery Right 2011  . Tooth extraction  June 2015    Wisdom Tooth Extraction     Allergies: Omnipaque and Oxytetracycline  Medications: Prior to Admission medications   Medication Sig Start Date End Date Taking? Authorizing Provider  aspirin 81 MG EC tablet Take 81 mg by mouth daily.     Yes Historical Provider, MD  atorvastatin (LIPITOR) 40 MG tablet Take 40 mg by mouth daily.   Yes Historical Provider, MD  lisinopril (PRINIVIL,ZESTRIL) 20 MG tablet Take 20 mg by mouth daily with lunch.    Yes Historical Provider, MD  metoprolol tartrate (LOPRESSOR) 25 MG tablet Take 25 mg by mouth 2 (two) times daily.  06/17/12  Yes Historical Provider, MD  Omega-3 Fatty Acids (FISH OIL) 1000 MG CAPS Take 1,000 mg by mouth daily.   Yes Historical Provider, MD  Saw Palmetto, Serenoa repens, 450 MG CAPS Take 2 capsules by mouth 2 (two) times daily.    Yes Historical Provider, MD    Family History  Problem Relation Age of Onset  . Aneurysm Sister     possibly   . Peripheral vascular disease Brother     History   Social History  . Marital Status: Married    Spouse Name: N/A    Number of Children: N/A  . Years of Education: N/A   Social History Main Topics  . Smoking status: Former Smoker -- 15 years    Types: Cigarettes    Quit date: 09/21/1985  . Smokeless tobacco: Current User    Types: Chew  . Alcohol  Use: No  . Drug Use: No  . Sexual Activity: None   Other Topics Concern  . None   Social History Narrative  . None   Review of Systems: A 12 point ROS discussed and pertinent positives are indicated in the HPI above.  All other systems are negative.  Review of Systems  Constitutional: Negative for fever and chills.  HENT: Negative for nosebleeds.   Respiratory: Negative for chest tightness and shortness of breath.   Cardiovascular: Negative for chest pain and leg swelling.  Gastrointestinal: Negative for abdominal pain and blood in stool.  Genitourinary: Negative for hematuria.  Musculoskeletal:       LLE pain  Skin: Positive for color change, pallor,  rash and wound.  Neurological: Negative for headaches.   Vital Signs: BP 113/55  Pulse 62  Temp(Src) 97.8 F (36.6 C) (Oral)  Resp 18  Ht '6\' 2"'  (1.88 m)  Wt 208 lb (94.348 kg)  BMI 26.69 kg/m2  SpO2 97%  Physical Exam  Constitutional: He is oriented to person, place, and time. He appears well-developed and well-nourished. No distress.  HENT:  Head: Normocephalic and atraumatic.  Nose: Nose normal.  Mouth/Throat: Oropharynx is clear and moist.  Neck: Normal range of motion. Neck supple. No tracheal deviation present.  Cardiovascular: Normal rate and regular rhythm.  Exam reveals no gallop and no friction rub.   No murmur heard. Right DP/PT intact 1+ and doppler. Left PT found with doppler, DP absent with doppler.  Pulmonary/Chest: Effort normal and breath sounds normal. No respiratory distress. He has no wheezes. He has no rales.  Abdominal: Soft. Bowel sounds are normal. He exhibits no distension. There is no tenderness.  Musculoskeletal: Normal range of motion. He exhibits tenderness. He exhibits no edema.  Neurological: He is alert and oriented to person, place, and time.  Skin: Skin is warm and dry. Rash noted. He is not diaphoretic. There is erythema.  LLE with healed puncture wound at calf site and rash over calf without open injury.   LLE with more pallor than RLE, bilaterally warm, cap refill unable to be tested secondary onychomycosis   Psychiatric: He has a normal mood and affect. His behavior is normal. Thought content normal.   Imaging: No results found.  Labs: Lab Results  Component Value Date   WBC 16.3* 10/23/2013   HCT 43.6 10/23/2013   MCV 92.4 10/23/2013   PLT 239 10/23/2013   NA 140 10/22/2013   K 4.1 10/22/2013   CL 109 10/22/2013   CO2 26 11/25/2009   GLUCOSE 106* 10/22/2013   BUN 18 10/22/2013   CREATININE 1.20 10/22/2013   CALCIUM 8.9 11/25/2009   PROT 6.2 11/25/2009   ALBUMIN 3.3* 11/25/2009   AST 17 11/25/2009   ALT 14 11/25/2009   ALKPHOS 57  11/25/2009   BILITOT 1.6* 11/25/2009   GFRNONAA >60 11/25/2009   GFRAA  Value: >60        The eGFR has been calculated using the MDRD equation. This calculation has not been validated in all clinical situations. eGFR's persistently <60 mL/min signify possible Chronic Kidney Disease. 11/25/2009   INR 1.04 10/23/2013   Assessment and Plan: 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 History of popliteal aneurysm. S/p LLE arteriogram 10/22/13 Request for image guided left lower extremity arteriogram with possible catheter directed thrombolysis, balloon/stent, or thrombectomy with moderate sedation. Iodinated contrast allergy causing extremity rash, denies any respiratory or facial symptoms not pre-medicated, D/w Dr. Annamaria Boots will give 125 mg  IV solu-medrol and 50 mg IV benadryl now. Patient has been NPO, no blood thinners taken, labs and images reviewed. Risks and Benefits discussed with the patient. All of the patient's questions were answered, patient is agreeable to proceed. Consent signed and in chart. Admission per vascular, d/w Dr. Bridgett Larsson today.  Tsosie Billing PA-C Interventional Radiology  10/23/13  8:48 AM

## 2013-10-24 ENCOUNTER — Inpatient Hospital Stay (HOSPITAL_COMMUNITY): Payer: Medicare Other

## 2013-10-24 DIAGNOSIS — I999 Unspecified disorder of circulatory system: Secondary | ICD-10-CM

## 2013-10-24 DIAGNOSIS — I714 Abdominal aortic aneurysm, without rupture, unspecified: Secondary | ICD-10-CM

## 2013-10-24 DIAGNOSIS — J9600 Acute respiratory failure, unspecified whether with hypoxia or hypercapnia: Secondary | ICD-10-CM

## 2013-10-24 LAB — CBC WITH DIFFERENTIAL/PLATELET
BASOS ABS: 0 10*3/uL (ref 0.0–0.1)
Basophils Relative: 0 % (ref 0–1)
EOS PCT: 0 % (ref 0–5)
Eosinophils Absolute: 0 10*3/uL (ref 0.0–0.7)
HCT: 39.9 % (ref 39.0–52.0)
Hemoglobin: 13.5 g/dL (ref 13.0–17.0)
Lymphocytes Relative: 7 % — ABNORMAL LOW (ref 12–46)
Lymphs Abs: 1.6 10*3/uL (ref 0.7–4.0)
MCH: 31.6 pg (ref 26.0–34.0)
MCHC: 33.8 g/dL (ref 30.0–36.0)
MCV: 93.4 fL (ref 78.0–100.0)
MONO ABS: 1 10*3/uL (ref 0.1–1.0)
Monocytes Relative: 5 % (ref 3–12)
Neutro Abs: 19.3 10*3/uL — ABNORMAL HIGH (ref 1.7–7.7)
Neutrophils Relative %: 88 % — ABNORMAL HIGH (ref 43–77)
PLATELETS: 217 10*3/uL (ref 150–400)
RBC: 4.27 MIL/uL (ref 4.22–5.81)
RDW: 13.2 % (ref 11.5–15.5)
WBC: 22 10*3/uL — ABNORMAL HIGH (ref 4.0–10.5)

## 2013-10-24 LAB — CBC
HEMATOCRIT: 36.7 % — AB (ref 39.0–52.0)
HEMATOCRIT: 37.6 % — AB (ref 39.0–52.0)
Hemoglobin: 12.8 g/dL — ABNORMAL LOW (ref 13.0–17.0)
Hemoglobin: 12.9 g/dL — ABNORMAL LOW (ref 13.0–17.0)
MCH: 32.2 pg (ref 26.0–34.0)
MCH: 31.5 pg (ref 26.0–34.0)
MCHC: 34.9 g/dL (ref 30.0–36.0)
MCHC: 34.3 g/dL (ref 30.0–36.0)
MCV: 92.2 fL (ref 78.0–100.0)
MCV: 91.9 fL (ref 78.0–100.0)
Platelets: 235 10*3/uL (ref 150–400)
Platelets: 222 10*3/uL (ref 150–400)
RBC: 3.98 MIL/uL — ABNORMAL LOW (ref 4.22–5.81)
RBC: 4.09 MIL/uL — ABNORMAL LOW (ref 4.22–5.81)
RDW: 13 % (ref 11.5–15.5)
RDW: 13.2 % (ref 11.5–15.5)
WBC: 20.2 10*3/uL — ABNORMAL HIGH (ref 4.0–10.5)
WBC: 21.3 10*3/uL — AB (ref 4.0–10.5)

## 2013-10-24 LAB — HEPATIC FUNCTION PANEL
ALBUMIN: 3.4 g/dL — AB (ref 3.5–5.2)
ALT: 19 U/L (ref 0–53)
AST: 19 U/L (ref 0–37)
Alkaline Phosphatase: 59 U/L (ref 39–117)
BILIRUBIN DIRECT: 0.3 mg/dL (ref 0.0–0.3)
Indirect Bilirubin: 1.3 mg/dL — ABNORMAL HIGH (ref 0.3–0.9)
Total Bilirubin: 1.6 mg/dL — ABNORMAL HIGH (ref 0.3–1.2)
Total Protein: 6 g/dL (ref 6.0–8.3)

## 2013-10-24 LAB — TROPONIN I
Troponin I: <0.3 ng/mL (ref <0.30)
Troponin I: <0.3 ng/mL (ref <0.30)

## 2013-10-24 LAB — FIBRINOGEN
FIBRINOGEN: 172 mg/dL — AB (ref 204–475)
Fibrinogen: <60 mg/dL — CL (ref 204–475)
Fibrinogen: 69 mg/dL — CL (ref 204–475)
Fibrinogen: 316 mg/dL (ref 204–475)

## 2013-10-24 LAB — URINALYSIS, ROUTINE W REFLEX MICROSCOPIC
Bilirubin Urine: NEGATIVE
GLUCOSE, UA: NEGATIVE mg/dL
Hgb urine dipstick: NEGATIVE
Ketones, ur: NEGATIVE mg/dL
LEUKOCYTES UA: NEGATIVE
Nitrite: NEGATIVE
PH: 5 (ref 5.0–8.0)
PROTEIN: NEGATIVE mg/dL
SPECIFIC GRAVITY, URINE: 1.023 (ref 1.005–1.030)
Urobilinogen, UA: 0.2 mg/dL (ref 0.0–1.0)

## 2013-10-24 LAB — BASIC METABOLIC PANEL
Anion gap: 12 (ref 5–15)
BUN: 23 mg/dL (ref 6–23)
CALCIUM: 8.8 mg/dL (ref 8.4–10.5)
CO2: 22 mEq/L (ref 19–32)
CREATININE: 1.14 mg/dL (ref 0.50–1.35)
Chloride: 106 mEq/L (ref 96–112)
GFR calc Af Amer: 73 mL/min — ABNORMAL LOW (ref >90)
GFR, EST NON AFRICAN AMERICAN: 63 mL/min — AB (ref >90)
Glucose, Bld: 156 mg/dL — ABNORMAL HIGH (ref 70–99)
Potassium: 4.6 mEq/L (ref 3.7–5.3)
SODIUM: 140 meq/L (ref 137–147)

## 2013-10-24 LAB — GLUCOSE, CAPILLARY
GLUCOSE-CAPILLARY: 141 mg/dL — AB (ref 70–99)
GLUCOSE-CAPILLARY: 127 mg/dL — AB (ref 70–99)
Glucose-Capillary: 148 mg/dL — ABNORMAL HIGH (ref 70–99)

## 2013-10-24 LAB — PREPARE RBC (CROSSMATCH)

## 2013-10-24 LAB — PROTIME-INR
INR: 1.48 (ref 0.00–1.49)
PROTHROMBIN TIME: 17.9 s — AB (ref 11.6–15.2)

## 2013-10-24 LAB — LACTIC ACID, PLASMA: Lactic Acid, Venous: 2.4 mmol/L — ABNORMAL HIGH (ref 0.5–2.2)

## 2013-10-24 LAB — HEPARIN LEVEL (UNFRACTIONATED): Heparin Unfractionated: 0.32 IU/mL (ref 0.30–0.70)

## 2013-10-24 LAB — HEMOGLOBIN A1C
Hgb A1c MFr Bld: 5.6 % (ref <5.7)
Mean Plasma Glucose: 114 mg/dL (ref <117)

## 2013-10-24 MED ORDER — FENTANYL CITRATE 0.05 MG/ML IJ SOLN
INTRAMUSCULAR | Status: AC
  Administered 2013-10-24: 100 ug
  Filled 2013-10-24: qty 2

## 2013-10-24 MED ORDER — SODIUM CHLORIDE 0.9 % IV SOLN: Freq: Once | INTRAVENOUS | Status: DC

## 2013-10-24 MED ORDER — FENTANYL CITRATE 0.05 MG/ML IJ SOLN: 50.0000 ug | INTRAMUSCULAR | Status: DC | PRN

## 2013-10-24 MED ORDER — FAMOTIDINE IN NACL 20-0.9 MG/50ML-% IV SOLN
20.0000 mg | INTRAVENOUS | Status: AC
  Administered 2013-10-24: 20 mg via INTRAVENOUS
  Filled 2013-10-24: qty 50

## 2013-10-24 MED ORDER — FENTANYL CITRATE 0.05 MG/ML IJ SOLN
100.0000 ug | Freq: Once | INTRAMUSCULAR | Status: AC
  Administered 2013-10-24: 100 ug via INTRAVENOUS

## 2013-10-24 MED ORDER — SODIUM CHLORIDE 0.9 % IV SOLN
1.0000 mg/h | INTRAVENOUS | Status: DC
Start: 2013-10-24 — End: 2013-10-26
  Administered 2013-10-24 – 2013-10-26 (×3): 2 mg/h via INTRAVENOUS
  Filled 2013-10-24 (×3): qty 10

## 2013-10-24 MED ORDER — PANTOPRAZOLE SODIUM 40 MG IV SOLR
40.0000 mg | INTRAVENOUS | Status: DC
  Administered 2013-10-24 – 2013-10-27 (×4): 40 mg via INTRAVENOUS
  Filled 2013-10-24 (×6): qty 40

## 2013-10-24 MED ORDER — METHYLPREDNISOLONE SODIUM SUCC 125 MG IJ SOLR
125.0000 mg | INTRAMUSCULAR | Status: AC
  Administered 2013-10-24: 125 mg via INTRAVENOUS
  Filled 2013-10-24 (×2): qty 2

## 2013-10-24 MED ORDER — PROPOFOL 10 MG/ML IV EMUL
INTRAVENOUS | Status: AC
  Administered 2013-10-24: 50 mg
  Filled 2013-10-24: qty 50

## 2013-10-24 MED ORDER — IOHEXOL 350 MG/ML SOLN
80.0000 mL | Freq: Once | INTRAVENOUS | Status: AC | PRN
  Administered 2013-10-24: 80 mL via INTRAVENOUS

## 2013-10-24 MED ORDER — FENTANYL CITRATE 0.05 MG/ML IJ SOLN
50.0000 ug | INTRAMUSCULAR | Status: DC | PRN
  Administered 2013-10-24: 50 ug via INTRAVENOUS
  Filled 2013-10-24: qty 2

## 2013-10-24 MED ORDER — DOPAMINE-DEXTROSE 3.2-5 MG/ML-% IV SOLN
0.0000 ug/kg/min | INTRAVENOUS | Status: DC
  Administered 2013-10-24: 5 ug/kg/min via INTRAVENOUS

## 2013-10-24 MED ORDER — SODIUM CHLORIDE 0.9 % IV BOLUS (SEPSIS): 2000.0000 mL | Freq: Once | INTRAVENOUS | Status: DC

## 2013-10-24 MED ORDER — FENTANYL CITRATE 0.05 MG/ML IJ SOLN
0.0000 ug/h | INTRAMUSCULAR | Status: DC
  Administered 2013-10-24: 100 ug/h via INTRAVENOUS
  Administered 2013-10-25: 125 ug/h via INTRAVENOUS
  Administered 2013-10-25: 150 ug/h via INTRAVENOUS
  Filled 2013-10-24 (×3): qty 50

## 2013-10-24 MED ORDER — SUCCINYLCHOLINE CHLORIDE 20 MG/ML IJ SOLN
90.0000 mg | Freq: Once | INTRAMUSCULAR | Status: AC
  Administered 2013-10-24: 90 mg via INTRAVENOUS

## 2013-10-24 MED ORDER — SODIUM CHLORIDE 0.9 % IV SOLN
Freq: Once | INTRAVENOUS | Status: AC
  Administered 2013-10-24: 08:00:00 via INTRAVENOUS

## 2013-10-24 MED ORDER — MIDAZOLAM HCL 2 MG/2ML IJ SOLN
INTRAMUSCULAR | Status: AC
  Administered 2013-10-24: 2 mg
  Filled 2013-10-24: qty 2

## 2013-10-24 MED ORDER — IODIXANOL 320 MG/ML IV SOLN
100.0000 mL | Freq: Once | INTRAVENOUS | Status: AC | PRN
  Administered 2013-10-24: 70 mL via INTRAVENOUS

## 2013-10-24 MED ORDER — PHENYLEPHRINE HCL 10 MG/ML IJ SOLN
30.0000 ug/min | INTRAVENOUS | Status: DC
  Administered 2013-10-24: 150 ug/min via INTRAVENOUS
  Administered 2013-10-25: 80 ug/min via INTRAVENOUS
  Administered 2013-10-25: 50 ug/min via INTRAVENOUS
  Administered 2013-10-26: 5 ug/min via INTRAVENOUS
  Administered 2013-10-26: 10 ug/min via INTRAVENOUS
  Filled 2013-10-24 (×8): qty 4

## 2013-10-24 MED ORDER — DIPHENHYDRAMINE HCL 50 MG/ML IJ SOLN
50.0000 mg | Freq: Once | INTRAMUSCULAR | Status: AC
  Administered 2013-10-24: 50 mg via INTRAVENOUS
  Filled 2013-10-24: qty 1

## 2013-10-24 MED ORDER — MIDAZOLAM HCL 2 MG/2ML IJ SOLN
INTRAMUSCULAR | Status: AC
  Filled 2013-10-24: qty 2

## 2013-10-24 MED ORDER — FENTANYL CITRATE 0.05 MG/ML IJ SOLN
INTRAMUSCULAR | Status: AC
  Filled 2013-10-24: qty 2

## 2013-10-24 MED ORDER — INSULIN ASPART 100 UNIT/ML ~~LOC~~ SOLN
1.0000 [IU] | SUBCUTANEOUS | Status: DC
  Administered 2013-10-24 – 2013-10-26 (×3): 1 [IU] via SUBCUTANEOUS

## 2013-10-24 MED FILL — Medication: Qty: 1 | Status: AC

## 2013-10-24 NOTE — Progress Notes (Signed)
Rapid alert to camera in  Patient s/p placement of thrombolytic catheter L leg  With TNKase and heparin infusiing  Sudden left groin hematoma formation and pallor and shock  Plan Dc TKNAase Dc heparin dC antihypertensive Fluid bolus + neo + Type and cross + 2 unit probc cntinue saline through groin cath per Dr Imogene Burn of [facility] Stat labs CCM bedside fellow to bedssid stat  Dr. Kalman Shan, M.D., [facility].C.P Pulmonary and Critical Care Medicine Staff Physician [facility] Pager: [phone], If no answer or between  15:00h - 7:00h: call [phone]  10/24/2013 6:54 AM

## 2013-10-24 NOTE — Progress Notes (Addendum)
   Interval Progress Note  Since this AM, pt having interval periods of bradycardia.  Troponin negative so far and EKG unremarkable except for signs prior MI (prior known PCI of occluded R coronary).  Pt requiring vasopressor support:  80 mcg/min phenylephrine and 5 mcg/kg/min  DA to help with HR.  UOP stable at 50 cc/hr.  On exam, L shoulder hematoma softer.  L groin hematoma unchanged.  Abd remains soft with pulsatile mass.  Most of L leg appears perfused except for toes.  Radiology: CT Abd/pelvis: no retroperitoneal hematoma, AAA remains covered with EVAR, large thigh hematoma visualized, small L femoral PSA  Catheter injection: continued SFA/pop occlusion without reconstitution of named tibial vessel in calf, continued extensive collateral perfusion  CBC    Component Value Date/Time   WBC 20.2* 10/24/2013 1418   RBC 3.98* 10/24/2013 1418   HGB 12.8* 10/24/2013 1418   HCT 36.7* 10/24/2013 1418   PLT 235 10/24/2013 1418   MCV 92.2 10/24/2013 1418   MCH 32.2 10/24/2013 1418   MCHC 34.9 10/24/2013 1418   RDW 13.0 10/24/2013 1418   LYMPHSABS 1.6 10/24/2013 0635   MONOABS 1.0 10/24/2013 0635   EOSABS 0.0 10/24/2013 0635   BASOSABS 0.0 10/24/2013 0635   Fibrinogen 316 INR 1.48  Cardiac Panel (last 3 results)  Recent Labs  10/24/13 0635 10/24/13 1408  TROPONINI <0.30 <0.30   - pt's hemodynamic instability is not consistent with his physical findings and lab results - will broaden work-up to include possible infectious etiologies - recheck lactate - LFT - Blood cultures x 2 - UA - CXR already normal - Echo tomorrow and [place] [facility] consult  Leonides Sake, MD [facility] Office: [phone] Pager: [phone]  10/24/2013, 8:02 PM

## 2013-10-24 NOTE — Progress Notes (Signed)
   Daily Progress Note  Assessment/Planning: POD #1 s/p Thrombolytic catheter placement complicated with hematoma and bradycardiac/hypotensive event   Unclear exact sequence of events: some bleeding did occur, but doubt the hematoma present are responsible.  HCT likely does not reflect recent bleeding (drawn before increase in bleeding)  Cryoglobulin and pRBC ordered for transfusion  EKG and Troponin I pending  Coag pending  CTA Abd/pelvis to check abd aorta  Wean vasopressor  Vent mgmt per PCCM  Subjective  - 1 Day Post-Op   Pt had a bradycardia and hypotensive event this AM  By report: pt became bradycardiac associated with hypotension and development of hematoma in L shoulder and L groin  PCCM: turned off TNK and ran KVO via femoral sheath  BP became soft again after intubation to protect airway.   Objective Filed Vitals:   10/24/13 0300 10/24/13 0400 10/24/13 0500 10/24/13 0600  BP: 106/63 103/61 105/63 89/62  Pulse: 66 64 69 78  Temp:  98.6 F (37 C)    TempSrc:  Oral    Resp: Height:      Weight:      SpO2: 96% 95% 94% 93%    Intake/Output Summary (Last 24 hours) at 10/24/13 0726 Last data filed at 10/24/13 0600  Gross per 24 hour  Intake   1137 ml  Output   1130 ml  Net      7 ml    PULM  Intubated, on vent CV  RRR GI  soft, NTND, no guarding, palpable AAA VASC  L groin bandaged, moderate size hematoma, L foot without tibial signal, calf appears better perfused than before procedure L shd Tight hematoma present   Laboratory CBC    Component Value Date/Time   WBC 22.0* 10/24/2013 0635   HGB 13.5 10/24/2013 0635   HCT 39.9 10/24/2013 0635   PLT 217 10/24/2013 0635    BMET    Component Value Date/Time   NA 140 10/24/2013 0635   K 4.6 10/24/2013 0635   CL 106 10/24/2013 0635   CO2 22 10/24/2013 0635   GLUCOSE 156* 10/24/2013 0635   BUN 23 10/24/2013 0635   CREATININE 1.14 10/24/2013 0635   CALCIUM 8.8 10/24/2013 0635   GFRNONAA 63*  10/24/2013 0635   GFRAA 73* 10/24/2013 0635   Lab Results  Component Value Date   INR 1.48 10/24/2013   INR 1.28 10/23/2013   INR 1.04 10/23/2013   Fibrinogen <60 (repeat 69)  Troponin <0.30  Leonides Sake, MD [facility] Office: [phone] Pager: [phone]  10/24/2013, 7:26 AM

## 2013-10-24 NOTE — Progress Notes (Addendum)
Paged CCM MD about HR in the 40s, per MD will start dopamine infusion to maintain HR greater than 60, also informed him about slight increase in hematoma on LUE, per MD will give 2 units of CRYO.

## 2013-10-24 NOTE — Progress Notes (Signed)
ANTICOAGULATION CONSULT NOTE - Follow Up Consult  Pharmacy Consult for heparin Indication: VTE treatment  Allergies  Allergen Reactions  . Omnipaque [Iohexol] Rash    Pt requires full premeds and needs to bring a driver.  Broke out in rash on back and arms.  Ok w/ premeds  . Oxytetracycline Rash    Terramycin- RASH     Patient Measurements: Height: 6' 2.02" (188 cm) Weight: 207 lb 14.3 oz (94.3 kg) IBW/kg (Calculated) : 82.24 Heparin Dosing Weight: 94kg  Vital Signs: Temp: 97.8 F (36.6 C) (09/12 0809) Temp src: Oral (09/12 0400) BP: 126/65 mmHg (09/12 0750) Pulse Rate: 84 (09/12 0750)  Labs:  Recent Labs  10/22/13 0918 10/23/13 0655 10/23/13 1524 10/23/13 2041 10/24/13 0635  HGB 16.0 15.2 15.1  --  13.5  HCT 47.0 43.6 44.6  --  39.9  PLT  --  239 196  --  217  LABPROT  --  13.6 16.0*  --  17.9*  INR  --  1.04 1.28  --  1.48  HEPARINUNFRC  --   --  <0.10* 0.11* 0.32  CREATININE 1.20  --  1.11  --  1.14  TROPONINI  --   --   --   --  <0.30    Estimated Creatinine Clearance: 70.1 ml/min (by C-G formula based on Cr of 1.14).   Assessment: Patient was on heparin and intra-arterial TNKase after arteriogram and thrombectomy. Patient developed hematoma, subsequently developed bradycardia, shock, and near PE arrest. Heparin and TNKase have been stopped- receiving 1 unit PRBC and 2 cryo. A heparin level this morning (drawn right around the time heparin was stopped) was on lower end of therapeutic range.  Goal of Therapy:  Heparin level 0.2-0.5 units/ml (while on TNKase) Monitor platelets by anticoagulation protocol: Yes   Plan:  1. Continue holding heparin and TNKase 2. Follow along for plans for resumption  Violetta Lavalle D. Sherrye Puga, PharmD, BCPS Clinical Pharmacist Pager: [phone] 10/24/2013 9:49 AM

## 2013-10-24 NOTE — Sedation Documentation (Signed)
Rec'd pt from CT scan with RN and RT, assisted transfer to IR table for arteriogram, continue to monitor, no sedation administered.

## 2013-10-24 NOTE — Progress Notes (Signed)
19cc Fentanyl PCA wasted down sink. witnessed by Bethena Midget, RN.  [name] [name]

## 2013-10-24 NOTE — Progress Notes (Signed)
eLink Physician-Brief Progress Note Patient Name: [name] [name] DOB: Mar 08, 1945 MRN: [id number]   Date of Service  10/24/2013  HPI/Events of Note  Needs gi prophylaxis  eICU Interventions  Protonix 40 mg IV daily      Intervention Category Intermediate Interventions: Best-practice therapies (e.g. DVT, beta blocker, etc.)  Sandrea Hughs 10/24/2013, 9:33 PM

## 2013-10-24 NOTE — Consult Note (Signed)
/PULMONARY  / CRITICAL CARE MEDICINE CONSULTATION   Name: [name] [name] MRN: [id number] DOB: March 18, 1945    ADMISSION DATE:  10/23/2013 CONSULTATION DATE:  10/24/2013  REQUESTING CLINICIAN: Dr. Imogene Burn  PRIMARY SERVICE: Vascular  CHIEF COMPLAINT:  Near Bradycardic Arrest  BRIEF PATIENT DESCRIPTION: 68 M with ischemic left foot 1 day status post placement of thrombolytic catheter in L femoral artery. AM of 9/12 developed bradycardia in conjunction with sudden appearance of hematomas of L should and L groin. PCCM consulted for emergent management.   SIGNIFICANT EVENTS / STUDIES:  9/11 L femoral catheter with TPA infusion started 9/12 Near PEA arrest with acute hematomas 9/12 prophylactic intubation 9/12 Fibrinogen  <60  LINES / TUBES: PIVs ETT 9/12  CULTURES: None  ANTIBIOTICS: None  HISTORY OF PRESENT ILLNESS:  Mr. [name] [name] is a 68 yo M with CAD, severe PVD, AAA who presented to [facility] on 9/11 for an ischemic left foot. He underwent placement of a L femoral TPA-infusion catheter to attempt to salvage the limb. At ~ 6 :15 am on 9/12 he developed bradycardia with a near loss of pulse with simultaneous development of large hematomas of the L shoulder and L groin. The bradycardia and hypotension responded to atropine administered by the [facility] MD prior to my arrival. On my arrival patient was awake but being mask ventilated and unable to provide any history.  PAST MEDICAL HISTORY :  Past Medical History  Diagnosis Date  . Abdominal aortic aneurysm   . Popliteal aneurysm   . Hyperlipidemia   . Hypertension   . Coronary artery disease   . Detached retina   . Myocardial infarction     2007 heart stent  . Chronic kidney disease     Right kidney stone   Past Surgical History  Procedure Laterality Date  . Coronary stenting      2007  . Abdominal aortic aneurysm repair  11/24/09    EVAR  . Popliteal artery stent  11/2009    left popliteal artery   . Retinal detachment surgery  Right 2011  . Tooth extraction  June 2015    Wisdom Tooth Extraction   Prior to Admission medications   Medication Sig Start Date End Date Taking? Authorizing Provider  aspirin 81 MG EC tablet Take 81 mg by mouth daily.      Historical Provider, MD  atorvastatin (LIPITOR) 40 MG tablet Take 40 mg by mouth daily.    Historical Provider, MD  lisinopril (PRINIVIL,ZESTRIL) 20 MG tablet Take 20 mg by mouth daily with lunch.     Historical Provider, MD  metoprolol tartrate (LOPRESSOR) 25 MG tablet Take 25 mg by mouth 2 (two) times daily.  06/17/12   Historical Provider, MD  Saw Palmetto, Serenoa repens, 450 MG CAPS Take 2 capsules by mouth 2 (two) times daily.     Historical Provider, MD   Allergies  Allergen Reactions  . Omnipaque [Iohexol] Rash    Pt requires full premeds and needs to bring a driver.  Broke out in rash on back and arms.  Ok w/ premeds  . Oxytetracycline Rash    Terramycin- RASH     FAMILY HISTORY:  Family History  Problem Relation Age of Onset  . Aneurysm Sister     possibly   . Peripheral vascular disease Brother    SOCIAL HISTORY:  reports that he quit smoking about 28 years ago. His smoking use included Cigarettes. He smoked 0.00 packs per day for 15 years. His smokeless tobacco use  includes Chew. He reports that he does not drink alcohol or use illicit drugs.  REVIEW OF SYSTEMS:  Unable to obtain secondary to patient condition.   SUBJECTIVE:   VITAL SIGNS: Temp:  [98.3 F (36.8 C)-98.6 F (37 C)] 98.6 F (37 C) (09/12 0400) Pulse Rate:  [56-91] 78 (09/12 0600) Resp:  [7-19] 15 (09/12 0600) BP: (89-124)/(51-67) 89/62 mmHg (09/12 0600) SpO2:  [91 %-98 %] 93 % (09/12 0600) FiO2 (%):  [60 %] 60 % (09/12 0655) Weight:  [207 lb 14.3 oz (94.3 kg)] 207 lb 14.3 oz (94.3 kg) (09/11 1305) HEMODYNAMICS:   VENTILATOR SETTINGS: Vent Mode:  [-] PRVC FiO2 (%):  [60 %] 60 % Set Rate:  [15 bmp] 15 bmp Vt Set:  [660 mL] 660 mL PEEP:  [5 cmH20] 5 cmH20 INTAKE /  OUTPUT: Intake/Output     09/11 0701 - 09/12 0700 09/12 0701 - 09/13 0700   I.V. (mL/kg) 587 (6.2)    IV Piggyback 550    Total Intake(mL/kg) 1137 (12.1)    Urine (mL/kg/hr) 1130    Total Output 1130     Net +7            PHYSICAL EXAMINATION: General:  Elderly M intubated Neuro:  Intubated and sedated HEENT:  Sclera anicteric, conjunctiva pink, MMM, ETT present Neck: Trachea supple and midline, (-) LAN or JVD Cardiovascular:  RRR, NS1/S2, (-) MRG Lungs:  Coarse mechanical BS bilaterally  Abdomen:  S/NT/ND/(+)BS, large pulsatile mass in right mid abdomen Musculoskeletal:  (-) C/C/E Skin:  ~20 x 10 cm hematoma of left groin and ~10 cm diameter hematoma of left shoulder  LABS:  CBC  Recent Labs Lab 10/22/13 0918 10/23/13 0655 10/23/13 1524  WBC  --  16.3* 14.0*  HGB 16.0 15.2 15.1  HCT 47.0 43.6 44.6  PLT  --  239 196   Coag's  Recent Labs Lab 10/23/13 0655 10/23/13 1524  INR 1.04 1.28   BMET  Recent Labs Lab 10/22/13 0918 10/23/13 1524  NA 140 141  K 4.1 4.6  CL 109 107  CO2  --  20  BUN 18 20  CREATININE 1.20 1.11  GLUCOSE 106* 150*   Electrolytes  Recent Labs Lab 10/23/13 1524  CALCIUM 9.4   Sepsis Markers No results found for this basename: LATICACIDVEN, PROCALCITON, O2SATVEN,  in the last 168 hours ABG No results found for this basename: PHART, PCO2ART, PO2ART,  in the last 168 hours Liver Enzymes  Recent Labs Lab 10/23/13 1524  AST 25  ALT 25  ALKPHOS 66  BILITOT 1.9*  ALBUMIN 3.8   Cardiac Enzymes No results found for this basename: TROPONINI, PROBNP,  in the last 168 hours Glucose No results found for this basename: GLUCAP,  in the last 168 hours  Imaging Ir Angiogram Extremity Left  10/23/2013   CLINICAL DATA:  Occluded left popliteal stented aneurysm, rest pain, peripheral vascular disease  EXAM: ULTRASOUND GUIDANCE FOR VASCULAR ACCESS  LEFT LOWER EXTREMITY ANGIOGRAM  SELECTIVE LEFT POSTERIOR TIBIAL PERIPHERAL ANGIOGRAM   INSERTION OF A 5 FRENCH INFUSION CATHETER ACROSS THE LEFT POPLITEAL OCCLUSION FOR INITIATION OF INTRA-ARTERIAL THROMBOLYSIS  Date:  9/11/20159/12/2013 11:56 am  Radiologist:  M. Ruel Favors, MD  Guidance:  Ultrasound fluoroscopic  FLUOROSCOPY TIME:  3 min 12 seconds  MEDICATIONS AND MEDICAL HISTORY: 1 mg Versed, 25 mcg fentanyl  ANESTHESIA/SEDATION: 30 min  CONTRAST:  20mL VISIPAQUE IODIXANOL 320 MG/ML IV SOLN  COMPLICATIONS: No immediate  PROCEDURE: Informed consent was obtained from the  patient following explanation of the procedure, risks, benefits and alternatives. The patient understands, agrees and consents for the procedure. All questions were addressed. A time out was performed.  Maximal barrier sterile technique utilized including caps, mask, sterile gowns, sterile gloves, large sterile drape, hand hygiene, and Betadine.  Under sterile conditions and local anesthesia, ultrasound micropuncture access was performed of the left femoral artery in antegrade fashion. Guidewire inserted followed by 4 [place] dilator. Six French sheath inserted. Contrast injection performed for left lower extremity angiogram. Left common femoral, profunda femoral, and superficial femoral arteries are patent proximally. Left distal SFA occlusion noted above the occluded popliteal stent.  Catheter and guidewire access were manipulated through the left distal SFA and popliteal occlusion. Catheter was advanced below the knee into the left posterior tibial artery. Injection of the left posterior tibial artery demonstrates intra-arterial position. Thrombus also present throughout the left posterior tibial artery and the adjacent small branches.  Guidewire inserted to advance a 90 cm Unifuse catheter across the occluded segment with a 40 cm infusion length. Images obtained for documentation. Trans arterial catheter thrombolysis will be initiated with TNK 0.5 milligrams/hour.  IMPRESSION: Left distal SFA and stented popliteal arterial  ooclusion extending into the tibial vessels.  Successful catheter access across the occluded segment to begin intra-arterial transcatheter thrombolysis.   Electronically Signed   By: Ruel Favors M.D.   On: 10/23/2013 12:40   Ir Angiogram Selective Each Additional Vessel  10/23/2013   CLINICAL DATA:  Occluded left popliteal stented aneurysm, rest pain, peripheral vascular disease  EXAM: ULTRASOUND GUIDANCE FOR VASCULAR ACCESS  LEFT LOWER EXTREMITY ANGIOGRAM  SELECTIVE LEFT POSTERIOR TIBIAL PERIPHERAL ANGIOGRAM  INSERTION OF A 5 FRENCH INFUSION CATHETER ACROSS THE LEFT POPLITEAL OCCLUSION FOR INITIATION OF INTRA-ARTERIAL THROMBOLYSIS  Date:  9/11/20159/12/2013 11:56 am  Radiologist:  M. Ruel Favors, MD  Guidance:  Ultrasound fluoroscopic  FLUOROSCOPY TIME:  3 min 12 seconds  MEDICATIONS AND MEDICAL HISTORY: 1 mg Versed, 25 mcg fentanyl  ANESTHESIA/SEDATION: 30 min  CONTRAST:  20mL VISIPAQUE IODIXANOL 320 MG/ML IV SOLN  COMPLICATIONS: No immediate  PROCEDURE: Informed consent was obtained from the patient following explanation of the procedure, risks, benefits and alternatives. The patient understands, agrees and consents for the procedure. All questions were addressed. A time out was performed.  Maximal barrier sterile technique utilized including caps, mask, sterile gowns, sterile gloves, large sterile drape, hand hygiene, and Betadine.  Under sterile conditions and local anesthesia, ultrasound micropuncture access was performed of the left femoral artery in antegrade fashion. Guidewire inserted followed by 4 [place] dilator. Six French sheath inserted. Contrast injection performed for left lower extremity angiogram. Left common femoral, profunda femoral, and superficial femoral arteries are patent proximally. Left distal SFA occlusion noted above the occluded popliteal stent.  Catheter and guidewire access were manipulated through the left distal SFA and popliteal occlusion. Catheter was advanced below the knee  into the left posterior tibial artery. Injection of the left posterior tibial artery demonstrates intra-arterial position. Thrombus also present throughout the left posterior tibial artery and the adjacent small branches.  Guidewire inserted to advance a 90 cm Unifuse catheter across the occluded segment with a 40 cm infusion length. Images obtained for documentation. Trans arterial catheter thrombolysis will be initiated with TNK 0.5 milligrams/hour.  IMPRESSION: Left distal SFA and stented popliteal arterial ooclusion extending into the tibial vessels.  Successful catheter access across the occluded segment to begin intra-arterial transcatheter thrombolysis.   Electronically Signed   By: Sharen Counter.D.  On: 10/23/2013 12:40   Ir US Guide Vasc Access Left  10/23/2013   CLINICAL DATA:  Occluded left popliteal stented aneurysm, rest pain, peripheral vascular disease  EXAM: ULTRASOUND GUIDANCE FOR VASCULAR ACCESS  LEFT LOWER EXTREMITY ANGIOGRAM  SELECTIVE LEFT POSTERIOR TIBIAL PERIPHERAL ANGIOGRAM  INSERTION OF A 5 FRENCH INFUSION CATHETER ACROSS THE LEFT POPLITEAL OCCLUSION FOR INITIATION OF INTRA-ARTERIAL THROMBOLYSIS  Date:  9/11/20159/12/2013 11:56 am  Radiologist:  M. Ruel Favors, MD  Guidance:  Ultrasound fluoroscopic  FLUOROSCOPY TIME:  3 min 12 seconds  MEDICATIONS AND MEDICAL HISTORY: 1 mg Versed, 25 mcg fentanyl  ANESTHESIA/SEDATION: 30 min  CONTRAST:  20mL VISIPAQUE IODIXANOL 320 MG/ML IV SOLN  COMPLICATIONS: No immediate  PROCEDURE: Informed consent was obtained from the patient following explanation of the procedure, risks, benefits and alternatives. The patient understands, agrees and consents for the procedure. All questions were addressed. A time out was performed.  Maximal barrier sterile technique utilized including caps, mask, sterile gowns, sterile gloves, large sterile drape, hand hygiene, and Betadine.  Under sterile conditions and local anesthesia, ultrasound micropuncture access was  performed of the left femoral artery in antegrade fashion. Guidewire inserted followed by 4 [place] dilator. Six French sheath inserted. Contrast injection performed for left lower extremity angiogram. Left common femoral, profunda femoral, and superficial femoral arteries are patent proximally. Left distal SFA occlusion noted above the occluded popliteal stent.  Catheter and guidewire access were manipulated through the left distal SFA and popliteal occlusion. Catheter was advanced below the knee into the left posterior tibial artery. Injection of the left posterior tibial artery demonstrates intra-arterial position. Thrombus also present throughout the left posterior tibial artery and the adjacent small branches.  Guidewire inserted to advance a 90 cm Unifuse catheter across the occluded segment with a 40 cm infusion length. Images obtained for documentation. Trans arterial catheter thrombolysis will be initiated with TNK 0.5 milligrams/hour.  IMPRESSION: Left distal SFA and stented popliteal arterial ooclusion extending into the tibial vessels.  Successful catheter access across the occluded segment to begin intra-arterial transcatheter thrombolysis.   Electronically Signed   By: Ruel Favors M.D.   On: 10/23/2013 12:40   Ir Infusion Thrombol Arterial Initial (ms)  10/23/2013   CLINICAL DATA:  Occluded left popliteal stented aneurysm, rest pain, peripheral vascular disease  EXAM: ULTRASOUND GUIDANCE FOR VASCULAR ACCESS  LEFT LOWER EXTREMITY ANGIOGRAM  SELECTIVE LEFT POSTERIOR TIBIAL PERIPHERAL ANGIOGRAM  INSERTION OF A 5 FRENCH INFUSION CATHETER ACROSS THE LEFT POPLITEAL OCCLUSION FOR INITIATION OF INTRA-ARTERIAL THROMBOLYSIS  Date:  9/11/20159/12/2013 11:56 am  Radiologist:  M. Ruel Favors, MD  Guidance:  Ultrasound fluoroscopic  FLUOROSCOPY TIME:  3 min 12 seconds  MEDICATIONS AND MEDICAL HISTORY: 1 mg Versed, 25 mcg fentanyl  ANESTHESIA/SEDATION: 30 min  CONTRAST:  20mL VISIPAQUE IODIXANOL 320 MG/ML IV  SOLN  COMPLICATIONS: No immediate  PROCEDURE: Informed consent was obtained from the patient following explanation of the procedure, risks, benefits and alternatives. The patient understands, agrees and consents for the procedure. All questions were addressed. A time out was performed.  Maximal barrier sterile technique utilized including caps, mask, sterile gowns, sterile gloves, large sterile drape, hand hygiene, and Betadine.  Under sterile conditions and local anesthesia, ultrasound micropuncture access was performed of the left femoral artery in antegrade fashion. Guidewire inserted followed by 4 [place] dilator. Six French sheath inserted. Contrast injection performed for left lower extremity angiogram. Left common femoral, profunda femoral, and superficial femoral arteries are patent proximally. Left distal SFA occlusion noted above the  occluded popliteal stent.  Catheter and guidewire access were manipulated through the left distal SFA and popliteal occlusion. Catheter was advanced below the knee into the left posterior tibial artery. Injection of the left posterior tibial artery demonstrates intra-arterial position. Thrombus also present throughout the left posterior tibial artery and the adjacent small branches.  Guidewire inserted to advance a 90 cm Unifuse catheter across the occluded segment with a 40 cm infusion length. Images obtained for documentation. Trans arterial catheter thrombolysis will be initiated with TNK 0.5 milligrams/hour.  IMPRESSION: Left distal SFA and stented popliteal arterial ooclusion extending into the tibial vessels.  Successful catheter access across the occluded segment to begin intra-arterial transcatheter thrombolysis.   Electronically Signed   By: Ruel Favors M.D.   On: 10/23/2013 12:40   EKG: Pending CXR: Post-intubation X-ray reviewed. ETT appears to be close to right mainstem.   ASSESSMENT / PLAN:  PULMONARY A: Acute respiratory failure 2/2 AMS:  P:   Lung  protective ventilation Holding VAP prevention bundle as we do not want to instrument his airway or esophagus given TPA-induced coagulopathy  CARDIOVASCULAR A: Near PEA arrest: Unclear etiology. Sequence of events suspicious for bleeding induced process. ? Involvement of AAA in process +/- RP bleeding. Other possibilities include primary cardiac events although its hard to imagine ACS given presence of TPA.  AAA: Hypotension: Initially related to bradycardia. Afterwards likely 2/2 sedation medications. Currently not requiring pressors. PVD with acute thrombus of LLE P:   EKG Serial Trops CT Angiogram of Abdomen and Pelvis per Dr. Imogene Burn, will give premeds given allergy to contrast Management of PVD per vascular  RENAL A: CKD: Cr at baseline P:    GASTROINTESTINAL A: Elevated Bilirubin: Stable. ? gilbert's P:     HEMATOLOGIC A: Hypofibrinogenemia:  Acute Blood Loss P:   Per Dr. Imogene Burn will empirically infuse 1 U PRBCs and 2 cyro  INFECTIOUS A: No acute issues  ENDOCRINE A: Hyperglycemia:  P:   SSI A1c  NEUROLOGIC A: AMS: Likely 2/2 acute hemorrhage peripherally. No obvious focal signs.  P:   Head CT  TODAY'S SUMMARY:   I have personally obtained a history, examined the patient, evaluated laboratory and imaging results, formulated the assessment and plan and placed orders.  CRITICAL CARE: The patient is critically ill with multiple organ systems failure and requires high complexity decision making for assessment and support, frequent evaluation and titration of therapies, application of advanced monitoring technologies and extensive interpretation of multiple databases. Critical Care Time devoted to patient care services described in this note is 90 minutes.   Evalyn Casco, MD Pulmonary and Critical Care Medicine [facility] Pager: [phone]   10/24/2013, 7:07 AM

## 2013-10-24 NOTE — Progress Notes (Signed)
Pt's left groin assessed and a new large hematoma noted at groin site extending down left thigh with bruising and swelling. Shortly thereafter pt became bradycardiac with HR in 20-30s and hypotensive with SBP in 50s. Pt reported feeling hot and became less responsive. Pt was given Atropine and Zoll pads placed. Vascular and CCM called. Heparin gtt stopped and TNK infusion stopped. Pt was given a bolus of NS. New hematoma noted on the upper part of left arm. Pt's wife was called. Prior to intubation, pt received 100 mcg Fentanyl, 50 mg Propofol, 4 mg Versed, and 90 mg Succinylcholine. Report of events given to day shift RN.   Alfonso Ellis, RN

## 2013-10-24 NOTE — Procedures (Signed)
Intubation Procedure Note [name] [name] [id number] [birth year]  Procedure: Intubation Indications: Airway protection and maintenance  Procedure Details Consent: Unable to obtain consent because of emergent medical necessity. Time Out: Verified patient identification, verified procedure, site/side was marked, verified correct patient position, special equipment/implants available, medications/allergies/relevent history reviewed, required imaging and test results available.  Performed  Maximum sterile technique was used including gloves and hand hygiene.   Induced with 50 mg propofol, 200 mcg Fentanyl, 4 mg versed and paralyzed with 90 succinylcholine. Glidescope 3 used. Grade 1 view. 7.5 ETT advanced to 26 cm. Positive color change.    Evaluation Hemodynamic Status: Transient hypotension treated with pressors; O2 sats: stable throughout Patient's Current Condition: stable Complications: No apparent complications Patient did tolerate procedure well. Chest X-ray ordered to verify placement.  CXR: tube position low-repostitioned.   [name] [name] R. 10/24/2013

## 2013-10-25 ENCOUNTER — Encounter (HOSPITAL_COMMUNITY): Payer: Self-pay

## 2013-10-25 ENCOUNTER — Inpatient Hospital Stay (HOSPITAL_COMMUNITY): Payer: Medicare Other

## 2013-10-25 DIAGNOSIS — I714 Abdominal aortic aneurysm, without rupture, unspecified: Secondary | ICD-10-CM

## 2013-10-25 DIAGNOSIS — I724 Aneurysm of artery of lower extremity: Secondary | ICD-10-CM

## 2013-10-25 DIAGNOSIS — I498 Other specified cardiac arrhythmias: Secondary | ICD-10-CM

## 2013-10-25 DIAGNOSIS — R001 Bradycardia, unspecified: Secondary | ICD-10-CM | POA: Diagnosis not present

## 2013-10-25 DIAGNOSIS — I999 Unspecified disorder of circulatory system: Secondary | ICD-10-CM

## 2013-10-25 LAB — BASIC METABOLIC PANEL
Anion gap: 12 (ref 5–15)
BUN: 26 mg/dL — ABNORMAL HIGH (ref 6–23)
CHLORIDE: 106 meq/L (ref 96–112)
CO2: 22 mEq/L (ref 19–32)
Calcium: 8.5 mg/dL (ref 8.4–10.5)
Creatinine, Ser: 1.04 mg/dL (ref 0.50–1.35)
GFR calc Af Amer: 82 mL/min — ABNORMAL LOW (ref >90)
GFR calc non Af Amer: 71 mL/min — ABNORMAL LOW (ref >90)
GLUCOSE: 100 mg/dL — AB (ref 70–99)
POTASSIUM: 4.5 meq/L (ref 3.7–5.3)
Sodium: 140 mEq/L (ref 137–147)

## 2013-10-25 LAB — PREPARE CRYOPRECIPITATE
UNIT DIVISION: 0
Unit division: 0
Unit division: 0
Unit division: 0

## 2013-10-25 LAB — GLUCOSE, CAPILLARY
GLUCOSE-CAPILLARY: 103 mg/dL — AB (ref 70–99)
GLUCOSE-CAPILLARY: 114 mg/dL — AB (ref 70–99)
GLUCOSE-CAPILLARY: 127 mg/dL — AB (ref 70–99)
Glucose-Capillary: 139 mg/dL — ABNORMAL HIGH (ref 70–99)
Glucose-Capillary: 111 mg/dL — ABNORMAL HIGH (ref 70–99)
Glucose-Capillary: 70 mg/dL (ref 70–99)
Glucose-Capillary: 93 mg/dL (ref 70–99)

## 2013-10-25 LAB — LIPASE, BLOOD: Lipase: 23 U/L (ref 11–59)

## 2013-10-25 LAB — POCT I-STAT 3, ART BLOOD GAS (G3+)
Acid-base deficit: 1 mmol/L (ref 0.0–2.0)
Bicarbonate: 22.6 mEq/L (ref 20.0–24.0)
O2 SAT: 95 %
PH ART: 7.425 (ref 7.350–7.450)
PO2 ART: 71 mmHg — AB (ref 80.0–100.0)
Patient temperature: 98.6
TCO2: 24 mmol/L (ref 0–100)
pCO2 arterial: 34.4 mmHg — ABNORMAL LOW (ref 35.0–45.0)

## 2013-10-25 LAB — HEPATIC FUNCTION PANEL
ALK PHOS: 58 U/L (ref 39–117)
ALT: 17 U/L (ref 0–53)
AST: 19 U/L (ref 0–37)
Albumin: 3.3 g/dL — ABNORMAL LOW (ref 3.5–5.2)
BILIRUBIN DIRECT: 0.3 mg/dL (ref 0.0–0.3)
BILIRUBIN INDIRECT: 1.3 mg/dL — AB (ref 0.3–0.9)
BILIRUBIN TOTAL: 1.6 mg/dL — AB (ref 0.3–1.2)
TOTAL PROTEIN: 6.2 g/dL (ref 6.0–8.3)

## 2013-10-25 LAB — CBC
HCT: 34.3 % — ABNORMAL LOW (ref 39.0–52.0)
Hemoglobin: 11.8 g/dL — ABNORMAL LOW (ref 13.0–17.0)
MCH: 31.6 pg (ref 26.0–34.0)
MCHC: 34.4 g/dL (ref 30.0–36.0)
MCV: 91.7 fL (ref 78.0–100.0)
Platelets: 178 10*3/uL (ref 150–400)
RBC: 3.74 MIL/uL — AB (ref 4.22–5.81)
RDW: 13.2 % (ref 11.5–15.5)
WBC: 15.2 10*3/uL — AB (ref 4.0–10.5)

## 2013-10-25 LAB — AMYLASE: Amylase: 95 U/L (ref 0–105)

## 2013-10-25 LAB — PROCALCITONIN: Procalcitonin: 0.2 ng/mL

## 2013-10-25 LAB — FIBRINOGEN
FIBRINOGEN: 435 mg/dL (ref 204–475)
Fibrinogen: 347 mg/dL (ref 204–475)
Fibrinogen: 506 mg/dL — ABNORMAL HIGH (ref 204–475)

## 2013-10-25 LAB — LACTIC ACID, PLASMA
LACTIC ACID, VENOUS: 2.1 mmol/L (ref 0.5–2.2)
Lactic Acid, Venous: 1.4 mmol/L (ref 0.5–2.2)

## 2013-10-25 LAB — HEPARIN LEVEL (UNFRACTIONATED): Heparin Unfractionated: <0.1 IU/mL — ABNORMAL LOW (ref 0.30–0.70)

## 2013-10-25 LAB — TROPONIN I

## 2013-10-25 MED ORDER — VITAL HIGH PROTEIN PO LIQD
1000.0000 mL | ORAL | Status: DC
  Administered 2013-10-25 – 2013-10-26 (×2): 1000 mL
  Filled 2013-10-25 (×4): qty 1000

## 2013-10-25 MED ORDER — CETYLPYRIDINIUM CHLORIDE 0.05 % MT LIQD
7.0000 mL | Freq: Four times a day (QID) | OROMUCOSAL | Status: DC
  Administered 2013-10-25 – 2013-10-27 (×10): 7 mL via OROMUCOSAL

## 2013-10-25 MED ORDER — ACETAMINOPHEN 160 MG/5ML PO SOLN
500.0000 mg | Freq: Four times a day (QID) | ORAL | Status: DC | PRN
  Administered 2013-10-28: 500 mg via ORAL
  Filled 2013-10-25: qty 20.3

## 2013-10-25 MED ORDER — SODIUM CHLORIDE 0.9 % IV SOLN
1750.0000 mg | Freq: Once | INTRAVENOUS | Status: AC
  Administered 2013-10-25: 1750 mg via INTRAVENOUS
  Filled 2013-10-25: qty 1750

## 2013-10-25 MED ORDER — PIPERACILLIN-TAZOBACTAM 3.375 G IVPB
3.3750 g | Freq: Three times a day (TID) | INTRAVENOUS | Status: DC
  Administered 2013-10-25 – 2013-11-01 (×20): 3.375 g via INTRAVENOUS
  Filled 2013-10-25 (×25): qty 50

## 2013-10-25 MED ORDER — PIPERACILLIN-TAZOBACTAM 3.375 G IVPB 30 MIN
3.3750 g | Freq: Once | INTRAVENOUS | Status: DC
Start: 2013-10-25 — End: 2013-10-25
  Filled 2013-10-25: qty 50

## 2013-10-25 MED ORDER — NICOTINE 7 MG/24HR TD PT24
7.0000 mg | MEDICATED_PATCH | Freq: Every day | TRANSDERMAL | Status: DC
  Administered 2013-10-25 – 2013-11-16 (×23): 7 mg via TRANSDERMAL
  Filled 2013-10-25 (×23): qty 1

## 2013-10-25 MED ORDER — CHLORHEXIDINE GLUCONATE 0.12 % MT SOLN
15.0000 mL | Freq: Two times a day (BID) | OROMUCOSAL | Status: DC
  Administered 2013-10-25 – 2013-10-27 (×5): 15 mL via OROMUCOSAL
  Filled 2013-10-25 (×5): qty 15

## 2013-10-25 MED ORDER — VANCOMYCIN HCL 10 G IV SOLR
1250.0000 mg | Freq: Two times a day (BID) | INTRAVENOUS | Status: DC
  Administered 2013-10-25 – 2013-10-27 (×4): 1250 mg via INTRAVENOUS
  Filled 2013-10-25 (×5): qty 1250

## 2013-10-25 NOTE — Progress Notes (Signed)
   Interval Progress Note  Starting wean on vasopressors.  DA weaned off.  Phenylephrine @ 45 mcg/min.  UOP good.  Hematomas unchanged.  CBC Latest Ref Rng 10/25/2013 10/24/2013 10/24/2013  WBC 4.0 - 10.5 K/uL 15.2(H) 21.3(H) 20.2(H)  Hemoglobin 13.0 - 17.0 g/dL 11.8(L) 12.9(L) 12.8(L)  Hematocrit 39.0 - 52.0 % 34.3(L) 37.6(L) 36.7(L)  Platelets 150 - 400 K/uL 178 222 235   H/H stable.  L leg exam unchanged.  L foot less mottled.  +thready signal at PT  - hopefully pt can be weaned off vasopressors and extubated tomorrow   Leonides Sake, MD [facility] Office: [phone] Pager: [phone]  10/25/2013, 7:41 PM

## 2013-10-25 NOTE — Progress Notes (Signed)
Subjective: Patient presented 10/23/13 as an outpatient for LLE catheter directed thrombolysis given arteriogram findings on 9/10 of limb ischemia with popliteal stent occlusion. Procedure was complicated by bleeding. Patient with change in mental status and hypotension and bradycardia requiring intubation. Follow up LLE arteriogram 9/12 revealed persistent occlusion of the popliteal artery without significant collateral reconstitution distally.   Allergies: Omnipaque and Oxytetracycline  Medications: Prior to Admission medications   Medication Sig Start Date End Date Taking? Authorizing Provider  aspirin 81 MG EC tablet Take 81 mg by mouth daily.     Yes Historical Provider, MD  atorvastatin (LIPITOR) 40 MG tablet Take 40 mg by mouth daily.   Yes Historical Provider, MD  lisinopril (PRINIVIL,ZESTRIL) 20 MG tablet Take 20 mg by mouth daily with lunch.    Yes Historical Provider, MD  metoprolol tartrate (LOPRESSOR) 25 MG tablet Take 25 mg by mouth 2 (two) times daily.  06/17/12  Yes Historical Provider, MD  Omega-3 Fatty Acids (FISH OIL TRIPLE STRENGTH) 1400 MG CAPS Take 2,800 mg by mouth 2 (two) times daily.   Yes Historical Provider, MD  Saw Palmetto 450 MG CAPS Take 900 mg by mouth 2 (two) times daily.   Yes Historical Provider, MD    Review of Systems unable to be obtained.   Vital Signs: BP 108/46  Pulse 73  Temp(Src) 99.3 F (37.4 C) (Oral)  Resp 15  Ht 6' 2.02" (1.88 m)  Wt 207 lb 14.3 oz (94.3 kg)  BMI 26.68 kg/m2  SpO2 92%  Physical Exam General: Intubated, Alert, follows commands, opens both eyes Chest: Left shoulder hematoma softer Abd: Soft, LCFA sheath intact, left groin hematoma softer, LLE shin/calf warm, L foot cold pale with cyanotic toes, PT with doppler, RLE warm pulses palpable.   Imaging: Ct Head Wo Contrast  10/24/2013   CLINICAL DATA:  Altered mental status  EXAM: CT HEAD WITHOUT CONTRAST  TECHNIQUE: Contiguous axial images were obtained from the base of  the skull through the vertex without intravenous contrast.  COMPARISON:  None.  FINDINGS: Mild to moderate age-related cortical atrophy. No evidence of hemorrhage or extra-axial fluid. No evidence of infarct or mass. No hydrocephalus. Calvarium intact. No significant inflammatory change in the visualized portions of the paranasal sinuses.  IMPRESSION: No acute intracranial abnormalities.   Electronically Signed   By: Skipper Cliche M.D.   On: 10/24/2013 10:09   Ir Angiogram Extremity Left  10/23/2013   CLINICAL DATA:  Occluded left popliteal stented aneurysm, rest pain, peripheral vascular disease  EXAM: ULTRASOUND GUIDANCE FOR VASCULAR ACCESS  LEFT LOWER EXTREMITY ANGIOGRAM  SELECTIVE LEFT POSTERIOR TIBIAL PERIPHERAL ANGIOGRAM  INSERTION OF A 5 FRENCH INFUSION CATHETER ACROSS THE LEFT POPLITEAL OCCLUSION FOR INITIATION OF INTRA-ARTERIAL THROMBOLYSIS  Date:  9/11/20159/12/2013 11:56 am  Radiologist:  M. Daryll Brod, MD  Guidance:  Ultrasound fluoroscopic  FLUOROSCOPY TIME:  3 min 12 seconds  MEDICATIONS AND MEDICAL HISTORY: 1 mg Versed, 25 mcg fentanyl  ANESTHESIA/SEDATION: 30 min  CONTRAST:  28m VISIPAQUE IODIXANOL 320 MG/ML IV SOLN  COMPLICATIONS: No immediate  PROCEDURE: Informed consent was obtained from the patient following explanation of the procedure, risks, benefits and alternatives. The patient understands, agrees and consents for the procedure. All questions were addressed. A time out was performed.  Maximal barrier sterile technique utilized including caps, mask, sterile gowns, sterile gloves, large sterile drape, hand hygiene, and Betadine.  Under sterile conditions and local anesthesia, ultrasound micropuncture access was performed of the left femoral artery in antegrade fashion.  Guidewire inserted followed by 4 [place] dilator. Six French sheath inserted. Contrast injection performed for left lower extremity angiogram. Left common femoral, profunda femoral, and superficial femoral arteries are  patent proximally. Left distal SFA occlusion noted above the occluded popliteal stent.  Catheter and guidewire access were manipulated through the left distal SFA and popliteal occlusion. Catheter was advanced below the knee into the left posterior tibial artery. Injection of the left posterior tibial artery demonstrates intra-arterial position. Thrombus also present throughout the left posterior tibial artery and the adjacent small branches.  Guidewire inserted to advance a 90 cm Unifuse catheter across the occluded segment with a 40 cm infusion length. Images obtained for documentation. Trans arterial catheter thrombolysis will be initiated with TNK 0.5 milligrams/hour.  IMPRESSION: Left distal SFA and stented popliteal arterial ooclusion extending into the tibial vessels.  Successful catheter access across the occluded segment to begin intra-arterial transcatheter thrombolysis.   Electronically Signed   By: Daryll Brod M.D.   On: 10/23/2013 12:40   Ir Angiogram Selective Each Additional Vessel  10/23/2013   CLINICAL DATA:  Occluded left popliteal stented aneurysm, rest pain, peripheral vascular disease  EXAM: ULTRASOUND GUIDANCE FOR VASCULAR ACCESS  LEFT LOWER EXTREMITY ANGIOGRAM  SELECTIVE LEFT POSTERIOR TIBIAL PERIPHERAL ANGIOGRAM  INSERTION OF A 5 FRENCH INFUSION CATHETER ACROSS THE LEFT POPLITEAL OCCLUSION FOR INITIATION OF INTRA-ARTERIAL THROMBOLYSIS  Date:  9/11/20159/12/2013 11:56 am  Radiologist:  M. Daryll Brod, MD  Guidance:  Ultrasound fluoroscopic  FLUOROSCOPY TIME:  3 min 12 seconds  MEDICATIONS AND MEDICAL HISTORY: 1 mg Versed, 25 mcg fentanyl  ANESTHESIA/SEDATION: 30 min  CONTRAST:  59m VISIPAQUE IODIXANOL 320 MG/ML IV SOLN  COMPLICATIONS: No immediate  PROCEDURE: Informed consent was obtained from the patient following explanation of the procedure, risks, benefits and alternatives. The patient understands, agrees and consents for the procedure. All questions were addressed. A time out was  performed.  Maximal barrier sterile technique utilized including caps, mask, sterile gowns, sterile gloves, large sterile drape, hand hygiene, and Betadine.  Under sterile conditions and local anesthesia, ultrasound micropuncture access was performed of the left femoral artery in antegrade fashion. Guidewire inserted followed by 4 FPakistandilator. Six French sheath inserted. Contrast injection performed for left lower extremity angiogram. Left common femoral, profunda femoral, and superficial femoral arteries are patent proximally. Left distal SFA occlusion noted above the occluded popliteal stent.  Catheter and guidewire access were manipulated through the left distal SFA and popliteal occlusion. Catheter was advanced below the knee into the left posterior tibial artery. Injection of the left posterior tibial artery demonstrates intra-arterial position. Thrombus also present throughout the left posterior tibial artery and the adjacent small branches.  Guidewire inserted to advance a 90 cm Unifuse catheter across the occluded segment with a 40 cm infusion length. Images obtained for documentation. Trans arterial catheter thrombolysis will be initiated with TNK 0.5 milligrams/hour.  IMPRESSION: Left distal SFA and stented popliteal arterial ooclusion extending into the tibial vessels.  Successful catheter access across the occluded segment to begin intra-arterial transcatheter thrombolysis.   Electronically Signed   By: TDaryll BrodM.D.   On: 10/23/2013 12:40   Ir [place] Vasc Access Left  10/23/2013   CLINICAL DATA:  Occluded left popliteal stented aneurysm, rest pain, peripheral vascular disease  EXAM: ULTRASOUND GUIDANCE FOR VASCULAR ACCESS  LEFT LOWER EXTREMITY ANGIOGRAM  SELECTIVE LEFT POSTERIOR TIBIAL PERIPHERAL ANGIOGRAM  INSERTION OF A 5 FRENCH INFUSION CATHETER ACROSS THE LEFT POPLITEAL OCCLUSION FOR INITIATION OF INTRA-ARTERIAL THROMBOLYSIS  Date:  9/11/20159/12/2013 11:56 am  Radiologist:  M. Daryll Brod, MD  Guidance:  Ultrasound fluoroscopic  FLUOROSCOPY TIME:  3 min 12 seconds  MEDICATIONS AND MEDICAL HISTORY: 1 mg Versed, 25 mcg fentanyl  ANESTHESIA/SEDATION: 30 min  CONTRAST:  21m VISIPAQUE IODIXANOL 320 MG/ML IV SOLN  COMPLICATIONS: No immediate  PROCEDURE: Informed consent was obtained from the patient following explanation of the procedure, risks, benefits and alternatives. The patient understands, agrees and consents for the procedure. All questions were addressed. A time out was performed.  Maximal barrier sterile technique utilized including caps, mask, sterile gowns, sterile gloves, large sterile drape, hand hygiene, and Betadine.  Under sterile conditions and local anesthesia, ultrasound micropuncture access was performed of the left femoral artery in antegrade fashion. Guidewire inserted followed by 4 FPakistandilator. Six French sheath inserted. Contrast injection performed for left lower extremity angiogram. Left common femoral, profunda femoral, and superficial femoral arteries are patent proximally. Left distal SFA occlusion noted above the occluded popliteal stent.  Catheter and guidewire access were manipulated through the left distal SFA and popliteal occlusion. Catheter was advanced below the knee into the left posterior tibial artery. Injection of the left posterior tibial artery demonstrates intra-arterial position. Thrombus also present throughout the left posterior tibial artery and the adjacent small branches.  Guidewire inserted to advance a 90 cm Unifuse catheter across the occluded segment with a 40 cm infusion length. Images obtained for documentation. Trans arterial catheter thrombolysis will be initiated with TNK 0.5 milligrams/hour.  IMPRESSION: Left distal SFA and stented popliteal arterial ooclusion extending into the tibial vessels.  Successful catheter access across the occluded segment to begin intra-arterial transcatheter thrombolysis.   Electronically Signed   By: TDaryll BrodM.D.   On: 10/23/2013 12:40   Dg Chest Port 1 View  10/25/2013   CLINICAL DATA:  Evaluate endotracheal tube placement.  EXAM: PORTABLE CHEST - 1 VIEW  COMPARISON:  Chest x-ray 10/24/2013.  FINDINGS: An endotracheal tube is in place with tip 7.5 cm above the carina. Transcutaneous defibrillator pads projecting over the mid and lower left hemithorax. Lung volumes are low, with probable partial atelectasis of the right middle lobe. No acute consolidative airspace disease. No pleural effusions. No evidence of pulmonary edema. Heart size and mediastinal contours are within normal limits.  IMPRESSION: 1. Support apparatus, as above. 2. Low lung volumes with partial atelectasis of the right middle lobe.   Electronically Signed   By: DVinnie LangtonM.D.   On: 10/25/2013 09:25   Dg Chest Port 1 View  10/24/2013   CLINICAL DATA:  Recheck endotracheal tube placement  EXAM: PORTABLE CHEST - 1 VIEW  COMPARISON:  10/24/2013 at 0722 hr  FINDINGS: Endotracheal tube unchanged with tip about 4.3 cm above the carina. Limited inspiratory effect. Lungs clear.  IMPRESSION: No change in position of endotracheal tube   Electronically Signed   By: RSkipper ClicheM.D.   On: 10/24/2013 10:37   Dg Chest Port 1v Same Day  10/24/2013   CLINICAL DATA:  Hypotension.  EXAM: PORTABLE CHEST - 1 VIEW SAME DAY  COMPARISON:  11/24/2009  FINDINGS: Endotracheal tube is in good position. Heart size and pulmonary vascularity are normal and the lungs are clear. No osseous abnormality. No pneumothorax.  IMPRESSION: Endotracheal tube in good position.  Lungs are clear.   Electronically Signed   By: JRozetta NunneryM.D.   On: 10/24/2013 08:06   Ct Angio Abd/pel W/ And/or W/o  10/24/2013   CLINICAL DATA:  hemoharge in setting TPA use, possible enlarging AAA  EXAM: CT ANGIOGRAPHY ABDOMEN AND PELVIS  TECHNIQUE: Multidetector CT imaging of the abdomen and pelvis was performed using the standard protocol during bolus administration of intravenous  contrast. Multiplanar reconstructed images including MIPs were obtained and reviewed to evaluate the vascular anatomy.  CONTRAST:  62m OMNIPAQUE IOHEXOL 350 MG/ML SOLN  COMPARISON:  11/21/2012 and earlier studies  FINDINGS: ARTERIAL FINDINGS:  Aorta: Visualized distal descending thoracic and suprarenal segments unremarkable. Patent bifurcated infrarenal aortic stent graft. No evidence of endoleak. Native aneurysm diameter 5.6 cm on image 133/601, previously 5.7 cm by my measurement.  Celiac axis:          Patent  Superior mesenteric:  Patent, with classic distal branch anatomy.  Left renal:           Single, patent.  Right renal:          Duplicated, inferior dominant, both patent.  Inferior mesenteric:  Origin occlusion.  Left iliac: Left limb of the stent graft extends to the mid common iliac artery. Mild scattered plaque in the distal native common iliac artery and internal iliac artery. External iliac is widely patent. Common femoral artery is widely patent. There is a 9 mm pseudoaneurysm from the distal common femoral. Profunda femoris branches patent. Visualized proximal SFA patent. Antegrade arterial sheath is in the proximal SFA.  Right iliac: Right limb of the stent graft extends to the distal common iliac artery. Minimal plaque in the native internal iliac artery which is mildly ectatic -8 mm diameter. External iliac and common femoral artery patent. Visualized portions of proximal profunda femoris and SFA patent.  Venous findings:      Venous phase not obtained.  Review of the MIP images confirms the above findings.  Nonvascular findings: Minimal dependent atelectasis posteriorly in the visualized lung bases. Partially calcified subcentimeter stone in the dependent aspect of the nondilated gallbladder. 16 mm cyst in hepatic segment 3. Unremarkable spleen, adrenal glands, pancreas. Stable hepatic cysts. No hydronephrosis. Stomach, small bowel, and colon are nondilated.Normal appendix. A few scattered  sigmoid diverticula without adjacent inflammatory/edematous change. Urinary bladder is partially decompressed by Foley catheter. Marked prostatic enlargement with central coarse calcifications. Minimal spondylitic changes in the visualized lower thoracic and lumbar spine.  There is no retroperitoneal or pelvic hematoma. There is a large amount of probable blood throughout the subcutaneous tissues of the anterior right thigh.  IMPRESSION: 1. Patent infrarenal bifurcated aortic stent graft without endoleak. Slight decrease in native aneurysm sac diameter, 5.6 cm. 2. 937mpseudoaneurysm from left common femoral artery with large anterior thigh hematoma. 3. No evidence of retroperitoneal or pelvic hematoma. Critical Value/emergent results were called by telephone at the time of interpretation on 10/24/2013 at 11:02 am to Dr. ChBridgett Larssonwho verbally acknowledged these results.  4. Cholelithiasis 5. Marked prostatic enlargement.   Electronically Signed   By: DaArne Cleveland.D.   On: 10/24/2013 11:02   Ir Infusion Thrombol Arterial Initial (ms)  10/23/2013   CLINICAL DATA:  Occluded left popliteal stented aneurysm, rest pain, peripheral vascular disease  EXAM: ULTRASOUND GUIDANCE FOR VASCULAR ACCESS  LEFT LOWER EXTREMITY ANGIOGRAM  SELECTIVE LEFT POSTERIOR TIBIAL PERIPHERAL ANGIOGRAM  INSERTION OF A 5 FRENCH INFUSION CATHETER ACROSS THE LEFT POPLITEAL OCCLUSION FOR INITIATION OF INTRA-ARTERIAL THROMBOLYSIS  Date:  9/11/20159/12/2013 11:56 am  Radiologist:  M. TrDaryll BrodMD  Guidance:  Ultrasound fluoroscopic  FLUOROSCOPY TIME:  3 min 12 seconds  MEDICATIONS AND MEDICAL HISTORY: 1 mg Versed, 25  mcg fentanyl  ANESTHESIA/SEDATION: 30 min  CONTRAST:  65m VISIPAQUE IODIXANOL 320 MG/ML IV SOLN  COMPLICATIONS: No immediate  PROCEDURE: Informed consent was obtained from the patient following explanation of the procedure, risks, benefits and alternatives. The patient understands, agrees and consents for the procedure. All  questions were addressed. A time out was performed.  Maximal barrier sterile technique utilized including caps, mask, sterile gowns, sterile gloves, large sterile drape, hand hygiene, and Betadine.  Under sterile conditions and local anesthesia, ultrasound micropuncture access was performed of the left femoral artery in antegrade fashion. Guidewire inserted followed by 4 FPakistandilator. Six French sheath inserted. Contrast injection performed for left lower extremity angiogram. Left common femoral, profunda femoral, and superficial femoral arteries are patent proximally. Left distal SFA occlusion noted above the occluded popliteal stent.  Catheter and guidewire access were manipulated through the left distal SFA and popliteal occlusion. Catheter was advanced below the knee into the left posterior tibial artery. Injection of the left posterior tibial artery demonstrates intra-arterial position. Thrombus also present throughout the left posterior tibial artery and the adjacent small branches.  Guidewire inserted to advance a 90 cm Unifuse catheter across the occluded segment with a 40 cm infusion length. Images obtained for documentation. Trans arterial catheter thrombolysis will be initiated with TNK 0.5 milligrams/hour.  IMPRESSION: Left distal SFA and stented popliteal arterial ooclusion extending into the tibial vessels.  Successful catheter access across the occluded segment to begin intra-arterial transcatheter thrombolysis.   Electronically Signed   By: TDaryll BrodM.D.   On: 10/23/2013 12:40   Ir TJacolyn ReedyF/u Eval Art/ven Final Day (ms)  10/24/2013   CLINICAL DATA:  Left Popliteal aneurysm, post stent graft placement in 2011. Stent occlusion. Now post overnight catheter directed thrombolytic infusion. The infusion was terminated at approximately 6:45 a.m. because of a large left thigh hematoma.  EXAM: LEFT LOWER EXTREMITY ARTERIOGRAM THROUGH EXISTING CATHETER  TECHNIQUE: Contrast injected through the  previously placed antegrade left femoral sheaths for left lower extremity arteriography. The infusion catheter and wire were removed. The sheath was left in place at the patient returned to the ICU.  FINDINGS: SFA is patent with mild atheromatous irregularity, no stenosis. Long segment popliteal artery occlusion from above the stent graft. Small geniculate collaterals cross the knee, reconstituting only a short segment of a proximal calf tibial vessel. No named arteries are identified below the midcalf level.  IMPRESSION: 1. Persistent occlusion of the popliteal artery without significant collateral reconstitution distally. Critical Value/emergent results were called by telephone at the time of interpretation on 10/24/2013 to Dr. CBridgett Larssonwho verbally acknowledged these results.   Electronically Signed   By: DArne ClevelandM.D.   On: 10/24/2013 11:09    Labs: Results for orders placed during the hospital encounter of 10/23/13 (from the past 48 hour(s))  CBC     Status: Abnormal   Collection Time    10/23/13  3:24 PM      Result Value Ref Range   WBC 14.0 (*) 4.0 - 10.5 K/uL   RBC 4.70  4.22 - 5.81 MIL/uL   Hemoglobin 15.1  13.0 - 17.0 g/dL   HCT 44.6  39.0 - 52.0 %   MCV 94.9  78.0 - 100.0 fL   MCH 32.1  26.0 - 34.0 pg   MCHC 33.9  30.0 - 36.0 g/dL   RDW 12.8  11.5 - 15.5 %   Platelets 196  150 - 400 K/uL  COMPREHENSIVE METABOLIC PANEL  Status: Abnormal   Collection Time    10/23/13  3:24 PM      Result Value Ref Range   Sodium 141  137 - 147 mEq/L   Potassium 4.6  3.7 - 5.3 mEq/L   Chloride 107  96 - 112 mEq/L   CO2 20  19 - 32 mEq/L   Glucose, Bld 150 (*) 70 - 99 mg/dL   BUN 20  6 - 23 mg/dL   Creatinine, Ser 1.11  0.50 - 1.35 mg/dL   Calcium 9.4  8.4 - 10.5 mg/dL   Total Protein 7.0  6.0 - 8.3 g/dL   Albumin 3.8  3.5 - 5.2 g/dL   AST 25  0 - 37 U/L   ALT 25  0 - 53 U/L   Alkaline Phosphatase 66  39 - 117 U/L   Total Bilirubin 1.9 (*) 0.3 - 1.2 mg/dL   GFR calc non Af Amer 65 (*)  >90 mL/min   GFR calc Af Amer 76 (*) >90 mL/min   Comment: (NOTE)     The eGFR has been calculated using the CKD EPI equation.     This calculation has not been validated in all clinical situations.     eGFR's persistently <90 mL/min signify possible Chronic Kidney     Disease.   Anion gap 14  5 - 15  PROTIME-INR     Status: Abnormal   Collection Time    10/23/13  3:24 PM      Result Value Ref Range   Prothrombin Time 16.0 (*) 11.6 - 15.2 seconds   INR 1.28  0.00 - 1.49  HEPARIN LEVEL (UNFRACTIONATED)     Status: Abnormal   Collection Time    10/23/13  3:24 PM      Result Value Ref Range   Heparin Unfractionated <0.10 (*) 0.30 - 0.70 IU/mL   Comment:            IF HEPARIN RESULTS ARE BELOW     EXPECTED VALUES, AND PATIENT     DOSAGE HAS BEEN CONFIRMED,     SUGGEST FOLLOW UP TESTING     OF ANTITHROMBIN III LEVELS.  MRSA PCR SCREENING     Status: None   Collection Time    10/23/13  4:50 PM      Result Value Ref Range   MRSA by PCR NEGATIVE  NEGATIVE   Comment:            The GeneXpert MRSA Assay (FDA     approved for NASAL specimens     only), is one component of a     comprehensive MRSA colonization     surveillance program. It is not     intended to diagnose MRSA     infection nor to guide or     monitor treatment for     MRSA infections.  HEPARIN LEVEL (UNFRACTIONATED)     Status: Abnormal   Collection Time    10/23/13  8:41 PM      Result Value Ref Range   Heparin Unfractionated 0.11 (*) 0.30 - 0.70 IU/mL   Comment:            IF HEPARIN RESULTS ARE BELOW     EXPECTED VALUES, AND PATIENT     DOSAGE HAS BEEN CONFIRMED,     SUGGEST FOLLOW UP TESTING     OF ANTITHROMBIN III LEVELS.  FIBRINOGEN     Status: Abnormal   Collection Time    10/24/13  1:10  AM      Result Value Ref Range   Fibrinogen <60 (*) 204 - 475 mg/dL   Comment: CRITICAL RESULT CALLED TO, READ BACK BY AND VERIFIED WITH:     PERRIN,J RN 10/24/2013 0259 JORDANS     REPEATED TO VERIFY  FIBRINOGEN      Status: Abnormal   Collection Time    10/24/13  6:35 AM      Result Value Ref Range   Fibrinogen 69 (*) 204 - 475 mg/dL   Comment: REPEATED TO VERIFY     CRITICAL RESULT CALLED TO, READ BACK BY AND VERIFIED WITH:     PATEL L.,RN [id number] 09.12.15 BY JONESJ  HEPARIN LEVEL (UNFRACTIONATED)     Status: None   Collection Time    10/24/13  6:35 AM      Result Value Ref Range   Heparin Unfractionated 0.32  0.30 - 0.70 IU/mL   Comment:            IF HEPARIN RESULTS ARE BELOW     EXPECTED VALUES, AND PATIENT     DOSAGE HAS BEEN CONFIRMED,     SUGGEST FOLLOW UP TESTING     OF ANTITHROMBIN III LEVELS.  TYPE AND SCREEN     Status: None   Collection Time    10/24/13  6:35 AM      Result Value Ref Range   ABO/RH(D) O NEG     Antibody Screen NEG     Sample Expiration 10/27/2013     Unit Number [id number]     Blood Component Type RED CELLS,LR     Unit division 00     Status of Unit ISSUED,FINAL     Transfusion Status OK TO TRANSFUSE     Crossmatch Result Compatible     Unit Number [id number]     Blood Component Type RED CELLS,LR     Unit division 00     Status of Unit ALLOCATED     Transfusion Status OK TO TRANSFUSE     Crossmatch Result Compatible     Unit Number [id number]     Blood Component Type RBC LR PHER2     Unit division 00     Status of Unit ALLOCATED     Transfusion Status OK TO TRANSFUSE     Crossmatch Result Compatible     Unit Number [id number]     Blood Component Type RED CELLS,LR     Unit division 00     Status of Unit ALLOCATED     Transfusion Status OK TO TRANSFUSE     Crossmatch Result Compatible    CBC WITH DIFFERENTIAL     Status: Abnormal   Collection Time    10/24/13  6:35 AM      Result Value Ref Range   WBC 22.0 (*) 4.0 - 10.5 K/uL   RBC 4.27  4.22 - 5.81 MIL/uL   Hemoglobin 13.5  13.0 - 17.0 g/dL   HCT 39.9  39.0 - 52.0 %   MCV 93.4  78.0 - 100.0 fL   MCH 31.6  26.0 - 34.0 pg   MCHC 33.8  30.0 - 36.0 g/dL   RDW 13.2  11.5 - 15.5 %    Platelets 217  150 - 400 K/uL   Neutrophils Relative % 88 (*) 43 - 77 %   Neutro Abs 19.3 (*) 1.7 - 7.7 K/uL   Lymphocytes Relative 7 (*) 12 - 46 %   Lymphs Abs 1.6  0.7 - 4.0 K/uL  Monocytes Relative 5  3 - 12 %   Monocytes Absolute 1.0  0.1 - 1.0 K/uL   Eosinophils Relative 0  0 - 5 %   Eosinophils Absolute 0.0  0.0 - 0.7 K/uL   Basophils Relative 0  0 - 1 %   Basophils Absolute 0.0  0.0 - 0.1 K/uL  BASIC METABOLIC PANEL     Status: Abnormal   Collection Time    10/24/13  6:35 AM      Result Value Ref Range   Sodium 140  137 - 147 mEq/L   Potassium 4.6  3.7 - 5.3 mEq/L   Chloride 106  96 - 112 mEq/L   CO2 22  19 - 32 mEq/L   Glucose, Bld 156 (*) 70 - 99 mg/dL   BUN 23  6 - 23 mg/dL   Creatinine, Ser 1.14  0.50 - 1.35 mg/dL   Calcium 8.8  8.4 - 10.5 mg/dL   GFR calc non Af Amer 63 (*) >90 mL/min   GFR calc Af Amer 73 (*) >90 mL/min   Comment: (NOTE)     The eGFR has been calculated using the CKD EPI equation.     This calculation has not been validated in all clinical situations.     eGFR's persistently <90 mL/min signify possible Chronic Kidney     Disease.   Anion gap 12  5 - 15  PROTIME-INR     Status: Abnormal   Collection Time    10/24/13  6:35 AM      Result Value Ref Range   Prothrombin Time 17.9 (*) 11.6 - 15.2 seconds   INR 1.48  0.00 - 1.49  TROPONIN I     Status: None   Collection Time    10/24/13  6:35 AM      Result Value Ref Range   Troponin I <0.30  <0.30 ng/mL   Comment:            Due to the release kinetics of cTnI,     a negative result within the first hours     of the onset of symptoms does not rule out     myocardial infarction with certainty.     If myocardial infarction is still suspected,     repeat the test at appropriate intervals.  HEPATIC FUNCTION PANEL     Status: Abnormal   Collection Time    10/24/13  6:35 AM      Result Value Ref Range   Total Protein 6.0  6.0 - 8.3 g/dL   Albumin 3.4 (*) 3.5 - 5.2 g/dL   AST 19  0 - 37 U/L    ALT 19  0 - 53 U/L   Alkaline Phosphatase 59  39 - 117 U/L   Total Bilirubin 1.6 (*) 0.3 - 1.2 mg/dL   Bilirubin, Direct 0.3  0.0 - 0.3 mg/dL   Indirect Bilirubin 1.3 (*) 0.3 - 0.9 mg/dL  LACTIC ACID, PLASMA     Status: Abnormal   Collection Time    10/24/13  6:35 AM      Result Value Ref Range   Lactic Acid, Venous 2.4 (*) 0.5 - 2.2 mmol/L  PREPARE RBC (CROSSMATCH)     Status: None   Collection Time    10/24/13  7:00 AM      Result Value Ref Range   Order Confirmation ORDER PROCESSED BY BLOOD BANK    PREPARE RBC (CROSSMATCH)     Status: None   Collection  Time    10/24/13  7:30 AM      Result Value Ref Range   Order Confirmation DUPLICATE REQUEST    PREPARE CRYOPRECIPITATE     Status: None   Collection Time    10/24/13  7:30 AM      Result Value Ref Range   Unit Number [id number]     Blood Component Type CRYPOOL THAW     Unit division 00     Status of Unit ISSUED,FINAL     Transfusion Status OK TO TRANSFUSE     Unit Number [id number]     Blood Component Type CRYPOOL THAW     Unit division 00     Status of Unit ISSUED,FINAL     Transfusion Status OK TO TRANSFUSE    GLUCOSE, CAPILLARY     Status: Abnormal   Collection Time    10/24/13  8:08 AM      Result Value Ref Range   Glucose-Capillary 139 (*) 70 - 99 mg/dL  GLUCOSE, CAPILLARY     Status: Abnormal   Collection Time    10/24/13 11:49 AM      Result Value Ref Range   Glucose-Capillary 141 (*) 70 - 99 mg/dL  FIBRINOGEN     Status: Abnormal   Collection Time    10/24/13 12:00 PM      Result Value Ref Range   Fibrinogen 172 (*) 204 - 475 mg/dL  HEMOGLOBIN A1C     Status: None   Collection Time    10/24/13 12:00 PM      Result Value Ref Range   Hemoglobin A1C 5.6  <5.7 %   Comment: (NOTE)                                                                               According to the ADA Clinical Practice Recommendations for 2011, when     HbA1c is used as a screening test:      >=6.5%   Diagnostic of  Diabetes Mellitus               (if abnormal result is confirmed)     5.7-6.4%   Increased risk of developing Diabetes Mellitus     References:Diagnosis and Classification of Diabetes Mellitus,Diabetes     EFEO,7121,97(JOITG 1):S62-S69 and Standards of Medical Care in             Diabetes - 2011,Diabetes Care,2011,34 (Suppl 1):S11-S61.   Mean Plasma Glucose 114  <117 mg/dL   Comment: Performed at [facility]  TROPONIN I     Status: None   Collection Time    10/24/13  2:08 PM      Result Value Ref Range   Troponin I <0.30  <0.30 ng/mL   Comment:            Due to the release kinetics of cTnI,     a negative result within the first hours     of the onset of symptoms does not rule out     myocardial infarction with certainty.     If myocardial infarction is still suspected,     repeat the test at appropriate intervals.  CBC  Status: Abnormal   Collection Time    10/24/13  2:18 PM      Result Value Ref Range   WBC 20.2 (*) 4.0 - 10.5 K/uL   RBC 3.98 (*) 4.22 - 5.81 MIL/uL   Hemoglobin 12.8 (*) 13.0 - 17.0 g/dL   HCT 36.7 (*) 39.0 - 52.0 %   MCV 92.2  78.0 - 100.0 fL   MCH 32.2  26.0 - 34.0 pg   MCHC 34.9  30.0 - 36.0 g/dL   RDW 13.0  11.5 - 15.5 %   Platelets 235  150 - 400 K/uL  PREPARE CRYOPRECIPITATE     Status: None   Collection Time    10/24/13  3:30 PM      Result Value Ref Range   Unit Number [id number]     Blood Component Type CRYPOOL THAW     Unit division 00     Status of Unit ISSUED,FINAL     Transfusion Status OK TO TRANSFUSE     Unit Number [id number]     Blood Component Type CRYPOOL THAW     Unit division 00     Status of Unit ISSUED,FINAL     Transfusion Status OK TO TRANSFUSE    GLUCOSE, CAPILLARY     Status: Abnormal   Collection Time    10/24/13  4:03 PM      Result Value Ref Range   Glucose-Capillary 127 (*) 70 - 99 mg/dL  FIBRINOGEN     Status: None   Collection Time    10/24/13  6:22 PM      Result Value Ref Range   Fibrinogen  316  204 - 475 mg/dL  GLUCOSE, CAPILLARY     Status: Abnormal   Collection Time    10/24/13  7:54 PM      Result Value Ref Range   Glucose-Capillary 148 (*) 70 - 99 mg/dL   Comment 1 Documented in Chart     Comment 2 Notify RN    CBC     Status: Abnormal   Collection Time    10/24/13  7:58 PM      Result Value Ref Range   WBC 21.3 (*) 4.0 - 10.5 K/uL   RBC 4.09 (*) 4.22 - 5.81 MIL/uL   Hemoglobin 12.9 (*) 13.0 - 17.0 g/dL   HCT 37.6 (*) 39.0 - 52.0 %   MCV 91.9  78.0 - 100.0 fL   MCH 31.5  26.0 - 34.0 pg   MCHC 34.3  30.0 - 36.0 g/dL   RDW 13.2  11.5 - 15.5 %   Platelets 222  150 - 400 K/uL  URINALYSIS, ROUTINE W REFLEX MICROSCOPIC     Status: None   Collection Time    10/24/13  9:36 PM      Result Value Ref Range   Color, Urine YELLOW  YELLOW   APPearance CLEAR  CLEAR   Specific Gravity, Urine 1.023  1.005 - 1.030   pH 5.0  5.0 - 8.0   Glucose, UA NEGATIVE  NEGATIVE mg/dL   Hgb urine dipstick NEGATIVE  NEGATIVE   Bilirubin Urine NEGATIVE  NEGATIVE   Ketones, ur NEGATIVE  NEGATIVE mg/dL   Protein, ur NEGATIVE  NEGATIVE mg/dL   Urobilinogen, UA 0.2  0.0 - 1.0 mg/dL   Nitrite NEGATIVE  NEGATIVE   Leukocytes, UA NEGATIVE  NEGATIVE   Comment: MICROSCOPIC NOT DONE ON URINES WITH NEGATIVE PROTEIN, BLOOD, LEUKOCYTES, NITRITE, OR GLUCOSE <1000 mg/dL.  TROPONIN I  Status: None   Collection Time    10/24/13 11:00 PM      Result Value Ref Range   Troponin I <0.30  <0.30 ng/mL   Comment:            Due to the release kinetics of cTnI,     a negative result within the first hours     of the onset of symptoms does not rule out     myocardial infarction with certainty.     If myocardial infarction is still suspected,     repeat the test at appropriate intervals.  LACTIC ACID, PLASMA     Status: None   Collection Time    10/24/13 11:00 PM      Result Value Ref Range   Lactic Acid, Venous 2.1  0.5 - 2.2 mmol/L  AMYLASE     Status: None   Collection Time    10/24/13 11:00 PM       Result Value Ref Range   Amylase 95  0 - 105 U/L  LIPASE, BLOOD     Status: None   Collection Time    10/24/13 11:00 PM      Result Value Ref Range   Lipase 23  11 - 59 U/L  HEPATIC FUNCTION PANEL     Status: Abnormal   Collection Time    10/24/13 11:00 PM      Result Value Ref Range   Total Protein 6.2  6.0 - 8.3 g/dL   Albumin 3.3 (*) 3.5 - 5.2 g/dL   AST 19  0 - 37 U/L   ALT 17  0 - 53 U/L   Alkaline Phosphatase 58  39 - 117 U/L   Total Bilirubin 1.6 (*) 0.3 - 1.2 mg/dL   Bilirubin, Direct 0.3  0.0 - 0.3 mg/dL   Indirect Bilirubin 1.3 (*) 0.3 - 0.9 mg/dL  FIBRINOGEN     Status: None   Collection Time    10/25/13  1:47 AM      Result Value Ref Range   Fibrinogen 347  204 - 475 mg/dL  GLUCOSE, CAPILLARY     Status: Abnormal   Collection Time    10/25/13  4:08 AM      Result Value Ref Range   Glucose-Capillary 114 (*) 70 - 99 mg/dL   Comment 1 Documented in Chart     Comment 2 Notify RN     Assessment and Plan: 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  History of popliteal aneurysm.  History of AAA s/p EVAR S/p LLE arteriogram 10/22/13 S/p LLE catheter directed thrombolysis 4/70 complicated by bleeding/hematoma h/h stable now, hematomas improving, CT negative for RP bleed, CT head no evidence of bleed.  Follow up LLE arteriogram 9/12 persistent occlusion of the popliteal artery without significant collateral reconstitution distally, left sheath intact and may be used for arterial blood draws. Shock unknown etiology, requiring vasopressor support, intubated, cardiac workup negative so far, echo pending.  CCM and Vascular on board. D/w Dr. Vernard Gambles today.   Tsosie Billing PA-C Interventional Radiology  10/25/13  11:16 AM   I spent a total of 15 minutes face to face in clinical consultation/evaluation, greater than 50% of which was counseling/coordinating care

## 2013-10-25 NOTE — Consult Note (Signed)
Referring Physician:  Primary Physician: Tamsen Roers, PA Primary Cardiologist: Dr. Excell Seltzer Reason for Consultation: Bradycardia  HPI: 68 yo male w/ hx IMI 2007, BMS to RCA @ [facility], AAA w/ endovascular repair 2011 & L pop aneurysm w/ stent 2011, HTN, HLD.  06/2013 - Seen by SW and doing well from a cardiac standpoint.   09/2013 - stopped Plavix for a tooth extraction and did not restart  09/092015 - seen w/ LLE numbness and calf wound by VVS, L SFA & L pop artery occluded, ABI 0.78.   09/10 - OP LE angio, L SFA occluded proximal to previous stent  09/11 - Admitted, IR did arteriogram w/ thrombolytic catheter & TNK, then heparin. Later developed large L groin hematoma w/ shock, HR in the 20s-30s, SBP 50s. Decreased LOC. TNK/Heparin d/c'd. Rx w/ IVF, neo, PRBCs and cryo. Intubated prophylactically. No CPR required.  09/12 - Pt continued to have episodes of sinus bradycardia, as low as the 40s, sustained at times, has had some other bradycardia in the 50s, but not sustained < 40. Was started on dopamine (now on 5 mcg/kg/min)  to maintain BP/HR.  09/13 - Pt awake on vent. Indicates pain control OK, denies chest pain or SOB. Trying to wean Neo/DA, but this is very slow.    CT ab/pelvis: large thigh hematoma with 9mm pseudo. No RP bleed.   Review of Systems:     Cardiac Review of Systems: {Y] = yes  = no  Chest Pain [    ]  Resting SOB [   ] Exertional SOB  [  ]  Orthopnea [  ]   Pedal Edema [   ]    Palpitations [  ] Syncope  [  ]   Presyncope [   ]  General Review of Systems: [Y] = yes [  ]=no Constitional: recent weight change [  ]; anorexia [  ]; fatigue [  ]; nausea [  ]; night sweats [  ]; fever [  ]; or chills [  ];                                                                                                                                          Dental: poor dentition[  ];    Eye : blurred vision [  ]; diplopia [   ]; vision changes [  ];  Amaurosis fugax[   ]; Resp: cough [  ];  wheezing[  ];  hemoptysis[  ]; shortness of breath[  ]; paroxysmal nocturnal dyspnea[  ]; dyspnea on exertion[  ]; or orthopnea[  ];  GI:  gallstones[  ], vomiting[  ];  dysphagia[  ]; melena[  ];  hematochezia [  ]; heartburn[  ];   Hx of  Colonoscopy[  ]; GU: kidney stones [  ]; hematuria[  ];   dysuria [  ];  nocturia[  ];  history of     obstruction [  ];                 Skin: rash, swelling[  ];, hair loss[  ];  peripheral edema[  ];  or itching[  ]; Musculosketetal: myalgias[  ];  joint swelling[  ];  joint erythema[  ];  joint pain[  ];  back pain[  ];  Heme/Lymph: bruising[ y ];  bleeding[  y];  anemia[ y ];  Neuro: TIA[  ];  headaches[  ];  stroke[  ];  vertigo[  ];  seizures[  ];   paresthesias[  ];  difficulty walking[  ];  Psych:depression[  ]; anxiety[  ];  Endocrine: diabetes[  ];  thyroid dysfunction[  ];  Immunizations: Flu [  ]; Pneumococcal[  ];  Other:  Past Medical History  Diagnosis Date  . Abdominal aortic aneurysm   . Popliteal aneurysm   . Hyperlipidemia   . Hypertension   . Coronary artery disease   . Detached retina   . Myocardial infarction     2007 heart stent  . Chronic kidney disease     Right kidney stone   Past Surgical History  Procedure Laterality Date  . Coronary stenting      2007  . Abdominal aortic aneurysm repair  11/24/09    EVAR  . Popliteal artery stent  11/2009    left popliteal artery   . Retinal detachment surgery Right 2011  . Tooth extraction  June 2015    Wisdom Tooth Extraction   Current Medications: . sodium chloride   Intravenous Once  . sodium chloride   Intravenous Once  . antiseptic oral rinse  7 mL Mouth Rinse QID  . chlorhexidine  15 mL Mouth Rinse BID  . feeding supplement (VITAL HIGH PROTEIN)  1,000 mL Per Tube Q24H  . insulin aspart  1-3 Units Subcutaneous 6 times per day  . pantoprazole (PROTONIX) IV  40 mg Intravenous Q24H  . piperacillin-tazobactam (ZOSYN)  IV  3.375 g Intravenous 3  times per day  . potassium chloride  20-40 mEq Oral Once  . sodium chloride  2,000 mL Intravenous Once  . sodium chloride  3 mL Intravenous Q12H  . vancomycin  1,250 mg Intravenous Q12H  . vancomycin  1,750 mg Intravenous Once   Infusions:  . DOPamine 5 mcg/kg/min (10/25/13 0700)  . fentaNYL infusion INTRAVENOUS 150 mcg/hr (10/25/13 0700)  . midazolam (VERSED) infusion 2 mg/hr (10/25/13 0700)  . phenylephrine (NEO-SYNEPHRINE) Adult infusion 90 mcg/min (10/25/13 0700)    Prescriptions prior to admission  Medication Sig Dispense Refill  . aspirin 81 MG EC tablet Take 81 mg by mouth daily.        Marland Kitchen atorvastatin (LIPITOR) 40 MG tablet Take 40 mg by mouth daily.      Marland Kitchen lisinopril (PRINIVIL,ZESTRIL) 20 MG tablet Take 20 mg by mouth daily with lunch.       . metoprolol tartrate (LOPRESSOR) 25 MG tablet Take 25 mg by mouth 2 (two) times daily.       . Omega-3 Fatty Acids (FISH OIL TRIPLE STRENGTH) 1400 MG CAPS Take 2,800 mg by mouth 2 (two) times daily.      . Saw Palmetto 450 MG CAPS Take 900 mg by mouth 2 (two) times daily.       Allergies  Allergen Reactions  . Omnipaque [Iohexol] Rash    Pt requires full premeds and needs to bring a driver.  Broke out in rash on back and arms.  Ok w/ premeds  . Oxytetracycline Rash    Terramycin causes rash    History   Social History  . Marital Status: Married    Spouse Name: N/A    Number of Children: N/A  . Years of Education: N/A   Occupational History  . Retired    Social History Main Topics  . Smoking status: Former Smoker -- 15 years    Types: Cigarettes    Quit date: 09/21/1985  . Smokeless tobacco: Current User    Types: Chew  . Alcohol Use: No  . Drug Use: No  . Sexual Activity: Not on file   Other Topics Concern  . Not on file   Social History Narrative   Lives with family.    Family History  Problem Relation Age of Onset  . Aneurysm Sister     possibly   . Peripheral vascular disease Brother    Family Status   Relation Status Death Age  . Mother Deceased   . Father Deceased     PHYSICAL EXAM: Filed Vitals:   10/25/13 1100  BP:   Pulse:   Temp: 101.6 F (38.7 C)  Resp:   BP 108/46  Pulse 73  Temp(Src) 101.6 F (38.7 C) (Axillary)  Resp 15  Ht 6' 2.02" (1.88 m)  Wt 207 lb 14.3 oz (94.3 kg)  BMI 26.68 kg/m2  SpO2 92%   Intake/Output Summary (Last 24 hours) at 10/25/13 1239 Last data filed at 10/25/13 0900  Gross per 24 hour  Intake 2046.25 ml  Output   1625 ml  Net 421.25 ml    General:  Well developed, well-nourished WM, respiratory not labored HEENT: normal, but exam limited by meds/equipment Neck: no JVD. Carotids 2+ bilat; possible left bruit. No lymphadenopathy or thryomegaly appreciated. Cor: PMI nondisplaced. Regular rate & rhythm. No rubs, gallops, soft murmur Lungs: clear anteriorly Abdomen: soft, nontender, nondistended. No hepatosplenomegaly. No bruits or masses. Bowel sounds present Extremities: no cyanosis, clubbing, rash, edema Neuro: alert & oriented x 2, cranial nerves grossly intact. moves all 4 extremities Skin: large ecchymotic areas LUE, LLE with extensive areas of ecchymosis, hematomas noted but outer areas are soft, seem to be improving per reports  ECG: 10/24/2013 SR, Inferior Q waves are old Vent. rate 76 BPM PR interval 142 ms QRS duration 88 ms QT/QTc 390/438 ms P-R-T axes 71 -29 4  Results for orders placed during the hospital encounter of 10/23/13 (from the past 24 hour(s))  TROPONIN I     Status: None   Collection Time    10/24/13  2:08 PM      Result Value Ref Range   Troponin I <0.30  <0.30 ng/mL  CBC     Status: Abnormal   Collection Time    10/24/13  2:18 PM      Result Value Ref Range   WBC 20.2 (*) 4.0 - 10.5 K/uL   RBC 3.98 (*) 4.22 - 5.81 MIL/uL   Hemoglobin 12.8 (*) 13.0 - 17.0 g/dL   HCT 40.9 (*) 81.1 - 91.4 %   MCV 92.2  78.0 - 100.0 fL   MCH 32.2  26.0 - 34.0 pg   MCHC 34.9  30.0 - 36.0 g/dL   RDW 78.2  95.6 - 21.3 %    Platelets 235  150 - 400 K/uL  PREPARE CRYOPRECIPITATE     Status: None   Collection Time    10/24/13  3:30 PM  Result Value Ref Range   Unit Number [id number]     Blood Component Type CRYPOOL THAW     Unit division 00     Status of Unit ISSUED,FINAL     Transfusion Status OK TO TRANSFUSE     Unit Number [id number]     Blood Component Type CRYPOOL THAW     Unit division 00     Status of Unit ISSUED,FINAL     Transfusion Status OK TO TRANSFUSE    GLUCOSE, CAPILLARY     Status: Abnormal   Collection Time    10/24/13  4:03 PM      Result Value Ref Range   Glucose-Capillary 127 (*) 70 - 99 mg/dL  FIBRINOGEN     Status: None   Collection Time    10/24/13  6:22 PM      Result Value Ref Range   Fibrinogen 316  204 - 475 mg/dL  GLUCOSE, CAPILLARY     Status: Abnormal   Collection Time    10/24/13  7:54 PM      Result Value Ref Range   Glucose-Capillary 148 (*) 70 - 99 mg/dL   Comment 1 Documented in Chart     Comment 2 Notify RN    CBC     Status: Abnormal   Collection Time    10/24/13  7:58 PM      Result Value Ref Range   WBC 21.3 (*) 4.0 - 10.5 K/uL   RBC 4.09 (*) 4.22 - 5.81 MIL/uL   Hemoglobin 12.9 (*) 13.0 - 17.0 g/dL   HCT 62.9 (*) 52.8 - 41.3 %   MCV 91.9  78.0 - 100.0 fL   MCH 31.5  26.0 - 34.0 pg   MCHC 34.3  30.0 - 36.0 g/dL   RDW 24.4  01.0 - 27.2 %   Platelets 222  150 - 400 K/uL  URINALYSIS, ROUTINE W REFLEX MICROSCOPIC     Status: None   Collection Time    10/24/13  9:36 PM      Result Value Ref Range   Color, Urine YELLOW  YELLOW   APPearance CLEAR  CLEAR   Specific Gravity, Urine 1.023  1.005 - 1.030   pH 5.0  5.0 - 8.0   Glucose, UA NEGATIVE  NEGATIVE mg/dL   Hgb urine dipstick NEGATIVE  NEGATIVE   Bilirubin Urine NEGATIVE  NEGATIVE   Ketones, ur NEGATIVE  NEGATIVE mg/dL   Protein, ur NEGATIVE  NEGATIVE mg/dL   Urobilinogen, UA 0.2  0.0 - 1.0 mg/dL   Nitrite NEGATIVE  NEGATIVE   Leukocytes, UA NEGATIVE  NEGATIVE  TROPONIN I      Status: None   Collection Time    10/24/13 11:00 PM      Result Value Ref Range   Troponin I <0.30  <0.30 ng/mL  LACTIC ACID, PLASMA     Status: None   Collection Time    10/24/13 11:00 PM      Result Value Ref Range   Lactic Acid, Venous 2.1  0.5 - 2.2 mmol/L  AMYLASE     Status: None   Collection Time    10/24/13 11:00 PM      Result Value Ref Range   Amylase 95  0 - 105 U/L  LIPASE, BLOOD     Status: None   Collection Time    10/24/13 11:00 PM      Result Value Ref Range   Lipase 23  11 - 59 U/L  HEPATIC  FUNCTION PANEL     Status: Abnormal   Collection Time    10/24/13 11:00 PM      Result Value Ref Range   Total Protein 6.2  6.0 - 8.3 g/dL   Albumin 3.3 (*) 3.5 - 5.2 g/dL   AST 19  0 - 37 U/L   ALT 17  0 - 53 U/L   Alkaline Phosphatase 58  39 - 117 U/L   Total Bilirubin 1.6 (*) 0.3 - 1.2 mg/dL   Bilirubin, Direct 0.3  0.0 - 0.3 mg/dL   Indirect Bilirubin 1.3 (*) 0.3 - 0.9 mg/dL  GLUCOSE, CAPILLARY     Status: Abnormal   Collection Time    10/25/13 12:13 AM      Result Value Ref Range   Glucose-Capillary 127 (*) 70 - 99 mg/dL   Comment 1 Documented in Chart     Comment 2 Notify RN    FIBRINOGEN     Status: None   Collection Time    10/25/13  1:47 AM      Result Value Ref Range   Fibrinogen 347  204 - 475 mg/dL  GLUCOSE, CAPILLARY     Status: Abnormal   Collection Time    10/25/13  4:08 AM      Result Value Ref Range   Glucose-Capillary 114 (*) 70 - 99 mg/dL   Comment 1 Documented in Chart     Comment 2 Notify RN    CBC     Status: Abnormal   Collection Time    10/25/13 12:00 PM      Result Value Ref Range   WBC 15.2 (*) 4.0 - 10.5 K/uL   RBC 3.74 (*) 4.22 - 5.81 MIL/uL   Hemoglobin 11.8 (*) 13.0 - 17.0 g/dL   HCT 81.1 (*) 91.4 - 78.2 %   MCV 91.7  78.0 - 100.0 fL   MCH 31.6  26.0 - 34.0 pg   MCHC 34.4  30.0 - 36.0 g/dL   RDW 95.6  21.3 - 08.6 %   Platelets 178  150 - 400 K/uL   Radiology:  Ct Head Wo Contrast 10/24/2013   CLINICAL DATA:  Altered  mental status  EXAM: CT HEAD WITHOUT CONTRAST  TECHNIQUE: Contiguous axial images were obtained from the base of the skull through the vertex without intravenous contrast.  COMPARISON:  None.  FINDINGS: Mild to moderate age-related cortical atrophy. No evidence of hemorrhage or extra-axial fluid. No evidence of infarct or mass. No hydrocephalus. Calvarium intact. No significant inflammatory change in the visualized portions of the paranasal sinuses.  IMPRESSION: No acute intracranial abnormalities.   Electronically Signed   By: Esperanza Heir M.D.   On: 10/24/2013 10:09   Dg Chest Port 1 View 10/25/2013   CLINICAL DATA:  Evaluate endotracheal tube placement.  EXAM: PORTABLE CHEST - 1 VIEW  COMPARISON:  Chest x-ray 10/24/2013.  FINDINGS: An endotracheal tube is in place with tip 7.5 cm above the carina. Transcutaneous defibrillator pads projecting over the mid and lower left hemithorax. Lung volumes are low, with probable partial atelectasis of the right middle lobe. No acute consolidative airspace disease. No pleural effusions. No evidence of pulmonary edema. Heart size and mediastinal contours are within normal limits.  IMPRESSION: 1. Support apparatus, as above. 2. Low lung volumes with partial atelectasis of the right middle lobe.   Electronically Signed   By: Trudie Reed M.D.   On: 10/25/2013 09:25   Dg Chest Port 1 View 10/24/2013  CLINICAL DATA:  Recheck endotracheal tube placement  EXAM: PORTABLE CHEST - 1 VIEW  COMPARISON:  10/24/2013 at 0722 hr  FINDINGS: Endotracheal tube unchanged with tip about 4.3 cm above the carina. Limited inspiratory effect. Lungs clear.  IMPRESSION: No change in position of endotracheal tube   Electronically Signed   By: Esperanza Heir M.D.   On: 10/24/2013 10:37   Dg Chest Port 1v Same Day 10/24/2013   CLINICAL DATA:  Hypotension.  EXAM: PORTABLE CHEST - 1 VIEW SAME DAY  COMPARISON:  11/24/2009  FINDINGS: Endotracheal tube is in good position. Heart size and  pulmonary vascularity are normal and the lungs are clear. No osseous abnormality. No pneumothorax.  IMPRESSION: Endotracheal tube in good position.  Lungs are clear.   Electronically Signed   By: Geanie Cooley M.D.   On: 10/24/2013 08:06   Ct Angio Abd/pel W/ And/or W/o 10/24/2013   CLINICAL DATA:  hemoharge in setting TPA use, possible enlarging AAA  EXAM: CT ANGIOGRAPHY ABDOMEN AND PELVIS  TECHNIQUE: Multidetector CT imaging of the abdomen and pelvis was performed using the standard protocol during bolus administration of intravenous contrast. Multiplanar reconstructed images including MIPs were obtained and reviewed to evaluate the vascular anatomy.  CONTRAST:  80mL OMNIPAQUE IOHEXOL 350 MG/ML SOLN  COMPARISON:  11/21/2012 and earlier studies  FINDINGS: ARTERIAL FINDINGS:  Aorta: Visualized distal descending thoracic and suprarenal segments unremarkable. Patent bifurcated infrarenal aortic stent graft. No evidence of endoleak. Native aneurysm diameter 5.6 cm on image 133/601, previously 5.7 cm by my measurement.  Celiac axis:          Patent  Superior mesenteric:  Patent, with classic distal branch anatomy.  Left renal:           Single, patent.  Right renal:          Duplicated, inferior dominant, both patent.  Inferior mesenteric:  Origin occlusion.  Left iliac: Left limb of the stent graft extends to the mid common iliac artery. Mild scattered plaque in the distal native common iliac artery and internal iliac artery. External iliac is widely patent. Common femoral artery is widely patent. There is a 9 mm pseudoaneurysm from the distal common femoral. Profunda femoris branches patent. Visualized proximal SFA patent. Antegrade arterial sheath is in the proximal SFA.  Right iliac: Right limb of the stent graft extends to the distal common iliac artery. Minimal plaque in the native internal iliac artery which is mildly ectatic -8 mm diameter. External iliac and common femoral artery patent. Visualized portions of  proximal profunda femoris and SFA patent.  Venous findings:      Venous phase not obtained.  Review of the MIP images confirms the above findings.  Nonvascular findings: Minimal dependent atelectasis posteriorly in the visualized lung bases. Partially calcified subcentimeter stone in the dependent aspect of the nondilated gallbladder. 16 mm cyst in hepatic segment 3. Unremarkable spleen, adrenal glands, pancreas. Stable hepatic cysts. No hydronephrosis. Stomach, small bowel, and colon are nondilated.Normal appendix. A few scattered sigmoid diverticula without adjacent inflammatory/edematous change. Urinary bladder is partially decompressed by Foley catheter. Marked prostatic enlargement with central coarse calcifications. Minimal spondylitic changes in the visualized lower thoracic and lumbar spine.  There is no retroperitoneal or pelvic hematoma. There is a large amount of probable blood throughout the subcutaneous tissues of the anterior right thigh.  IMPRESSION: 1. Patent infrarenal bifurcated aortic stent graft without endoleak. Slight decrease in native aneurysm sac diameter, 5.6 cm. 2. 9mm pseudoaneurysm from left common femoral artery  with large anterior thigh hematoma. 3. No evidence of retroperitoneal or pelvic hematoma. Critical Value/emergent results were called by telephone at the time of interpretation on 10/24/2013 at 11:02 am to Dr. Imogene Burn, who verbally acknowledged these results.  4. Cholelithiasis 5. Marked prostatic enlargement.   Electronically Signed   By: Oley Balm M.D.   On: 10/24/2013 11:02   Ir Rande Lawman F/u Eval Art/ven Final Day (ms) 10/24/2013   CLINICAL DATA:  Left Popliteal aneurysm, post stent graft placement in 2011. Stent occlusion. Now post overnight catheter directed thrombolytic infusion. The infusion was terminated at approximately 6:45 a.m. because of a large left thigh hematoma.  EXAM: LEFT LOWER EXTREMITY ARTERIOGRAM THROUGH EXISTING CATHETER  TECHNIQUE: Contrast injected  through the previously placed antegrade left femoral sheaths for left lower extremity arteriography. The infusion catheter and wire were removed. The sheath was left in place at the patient returned to the ICU.  FINDINGS: SFA is patent with mild atheromatous irregularity, no stenosis. Long segment popliteal artery occlusion from above the stent graft. Small geniculate collaterals cross the knee, reconstituting only a short segment of a proximal calf tibial vessel. No named arteries are identified below the midcalf level.  IMPRESSION: 1. Persistent occlusion of the popliteal artery without significant collateral reconstitution distally. Critical Value/emergent results were called by telephone at the time of interpretation on 10/24/2013 to Dr. Imogene Burn who verbally acknowledged these results.   Electronically Signed   By: Oley Balm M.D.   On: 10/24/2013 11:09    ASSESSMENT: Principal Problem:   Lower limb ischemia - 09/12 angio showed SFA patent, persistent occlusion pop artery.   Active Problems:   Popliteal artery occlusion, left - see above   Bradycardia - all sinus   PLAN/DISCUSSION: Pressors being weaned, with pt maintaining HR/BP as long as wean is very slow. Tolerating lower doses of DA better than decreased neo. Vent mgt per CCM.  Cardiac enzymes negative for MI, no history of syncope or symptomatic bradycardia. MD advise if further evaluation by EP indicated or if will follow on telemetry and consult EP if has symptomatic bradycardia once he recovers from acute event.   Theodore Demark, PA-C 10/25/2013 12:39 PM Beeper [phone]  Patient seen and examined with Theodore Demark, PA-C. We discussed all aspects of the encounter. I agree with the assessment and plan as stated above.   On tele al bradycardia is sinus without blocks or other pathology. ECG with normal intervals suggesting normal conduction system. Troponins negative so no evidence ischemia.  Etiology of bradycardia likely vagal  response. Would wean dopamine as tolerated. Agree with echo. Avoid AV nodal blockers. Check TSH. We will follow.  Daniel Bensimhon,MD 1:56 PM

## 2013-10-25 NOTE — Progress Notes (Addendum)
   Daily Progress Note  Assessment/Planning: POD #2 s/p Thrombolytic catheter placement for occluded L popliteal stent complicated by bleeding complications, Shock of unknown etiology   Continues to be on significant amount of vasopressor support  No evidence of active bleeding currently with successful reversal thrombolytic activity  Degree of bleeding does not account for his shock: HCT 44 -> 37.6  No fevers but spike in WBC after brachycardiac/shock event yesterday day  Reportedly purulent sputum on ETT suction.  Prior CXR however clear.  Cardiac work-up also has been negative so far.  Echo ordered.  Will get Cardiology input in this patient with known MI, CAD s/p PCI previously  Screening labs for differential diagnosis also have been negative.  Continue in the ICU  Subjective  - 2 Days Post-Op  No events overnight, stable vasopressor profile  Objective Filed Vitals:   10/25/13 0645 10/25/13 0700 10/25/13 0715 10/25/13 0839  BP: 102/44 102/43 103/45 108/46  Pulse: 61 59 59 73  Temp:      TempSrc:      Resp: Height:      Weight:      SpO2: 91% 90% 91% 92%    Intake/Output Summary (Last 24 hours) at 10/25/13 1015 Last data filed at 10/25/13 0900  Gross per 24 hour  Intake 2400.75 ml  Output   1725 ml  Net 675.75 ml   PULM  BLL rales,   pCXR (10/25/13): 1. Support apparatus, as above. 2. Low lung volumes with partial atelectasis of the right middle lobe.  CV  RRR GI  soft, NTND, palpable Aorta MS L shoulder hematoma softer, echymosis draining in a dependent fashion VASC  L groin: hematoma softer, echymosis draining in a dependent fashion, L foot unchanged, cyanotic toes, PT signal this AM  Laboratory CBC    Component Value Date/Time   WBC 21.3* 10/24/2013 1958   HGB 12.9* 10/24/2013 1958   HCT 37.6* 10/24/2013 1958   PLT 222 10/24/2013 1958    BMET    Component Value Date/Time   NA 140 10/24/2013 0635   K 4.6 10/24/2013 0635   CL 106  10/24/2013 0635   CO2 22 10/24/2013 0635   GLUCOSE 156* 10/24/2013 0635   BUN 23 10/24/2013 0635   CREATININE 1.14 10/24/2013 0635   CALCIUM 8.8 10/24/2013 0635   GFRNONAA 63* 10/24/2013 0635   GFRAA 73* 10/24/2013 1610    Leonides Sake, MD [facility] Office: [phone] Pager: [phone]  10/25/2013, 10:15 AM

## 2013-10-25 NOTE — Progress Notes (Signed)
ANTIBIOTIC CONSULT NOTE - INITIAL  Pharmacy Consult for vancomycin and Zosyn Indication: sepsis  Allergies  Allergen Reactions  . Omnipaque [Iohexol] Rash    Pt requires full premeds and needs to bring a driver.  Broke out in rash on back and arms.  Ok w/ premeds  . Oxytetracycline Rash    Terramycin causes rash    Patient Measurements: Height: 6' 2.02" (188 cm) Weight: 207 lb 14.3 oz (94.3 kg) IBW/kg (Calculated) : 82.24 Adjusted Body Weight: 87kg  Vital Signs: Temp: 99.3 F (37.4 C) (09/13 0400) Temp src: Oral (09/13 0400) BP: 108/46 mmHg (09/13 0839) Pulse Rate: 73 (09/13 0839) Intake/Output from previous day: 09/12 0701 - 09/13 0700 In: 2685.8 [I.V.:1723.3; Blood:962.5] Out: 1550 [Urine:1550] Intake/Output from this shift: Total I/O In: -  Out: 325 [Urine:325]  Labs:  Recent Labs  10/23/13 1524 10/24/13 0635 10/24/13 1418 10/24/13 1958  WBC 14.0* 22.0* 20.2* 21.3*  HGB 15.1 13.5 12.8* 12.9*  PLT 196 217 235 222  CREATININE 1.11 1.14  --   --    Estimated Creatinine Clearance: 70.1 ml/min (by C-G formula based on Cr of 1.14). No results found for this basename: VANCOTROUGH, Leodis Binet, VANCORANDOM, GENTTROUGH, GENTPEAK, GENTRANDOM, TOBRATROUGH, TOBRAPEAK, TOBRARND, AMIKACINPEAK, AMIKACINTROU, AMIKACIN,  in the last 72 hours   Microbiology: Recent Results (from the past 720 hour(s))  MRSA PCR SCREENING     Status: None   Collection Time    10/23/13  4:50 PM      Result Value Ref Range Status   MRSA by PCR NEGATIVE  NEGATIVE Final   Comment:            The GeneXpert MRSA Assay (FDA     approved for NASAL specimens     only), is one component of a     comprehensive MRSA colonization     surveillance program. It is not     intended to diagnose MRSA     infection nor to guide or     monitor treatment for     MRSA infections.    Assessment: [name] with ischemic left foot s/p thrombolytic catheter placement. Yesterday morning became very bradycardic-  near arrest- with hematomas on L shoulder and L groin. He required pressor support and anticoagulation stopped. This morning, WBC are increased (21.3) and patient continues to be hypotensive despite adequate Hgb, concerned for sepsis. To pan culture and start broad antibiotics. Blood and sputum cultures have been sent. SCr 1.1, est CrCL ~40mL/min.  Goal of Therapy:  Vancomycin trough level 15-20 mcg/ml  Plan:  1. Vancomycin load with  IV x1, then start  IV q12h 2. Zosyn 3.375g IV q8h extended interval dosing (first dose to be given over 30 minutes as ordered) 3. Follow c/s, clinical progression, renal function, trough at [facility]  Evanie Buckle D. Rey Dansby, PharmD, BCPS Clinical Pharmacist Pager: [phone] 10/25/2013 9:57 AM

## 2013-10-25 NOTE — Progress Notes (Signed)
PULMONARY  / CRITICAL CARE MEDICINE    Name: [name] [name] MRN: [id number] DOB: April 30, 1945    ADMISSION DATE:  10/23/2013 CONSULTATION DATE:  10/24/2013  REQUESTING CLINICIAN: Dr. Imogene Burn  PRIMARY SERVICE: Vascular  CHIEF COMPLAINT:  Near Bradycardic Arrest  BRIEF PATIENT DESCRIPTION: 81 M with ischemic left foot 1 day status post placement of thrombolytic catheter in L femoral artery. AM of 9/12 developed bradycardia in conjunction with sudden appearance of hematomas of L should and L groin. PCCM consulted for emergent management.   SIGNIFICANT EVENTS / STUDIES:  9/11 L femoral catheter with TPA infusion started 9/12 Near PEA arrest with acute hematomas, intubated, shock CT head 9/12>>neg  CTA abd/pelvis 9/12>>> AAA no leak, 9mm pseudoaneurysm from left common femoral artery with large anterior thigh hematoma. No evidence of retroperitoneal or pelvic hematoma. Arteriogram LLE 9/12>>> Persistent occlusion of the popliteal artery without significant collateral reconstitution distally.   LINES / TUBES: PIVs ETT 9/12  CULTURES: BCx2 9/13>>> U/a 9/13>>>  ANTIBIOTICS: vanc 9/13>>> Zosyn 9/13>>>   SUBJECTIVE:  No sig change overnight.  Remains on pressors. CT neg for retroperitoneal bleed.  Dopplerable L foot pulse.   VITAL SIGNS: Temp:  [98.1 F (36.7 C)-100.7 F (38.2 C)] 99.3 F (37.4 C) (09/13 0400) Pulse Rate:  [47-74] 73 (09/13 0839) Resp:  [12-27] 15 (09/13 0839) BP: (70-167)/(36-81) 108/46 mmHg (09/13 0839) SpO2:  [90 %-100 %] 92 % (09/13 0839) Arterial Line BP: (73-173)/(43-124) 129/50 mmHg (09/13 0715) FiO2 (%):  [40 %-100 %] 50 % (09/13 0839) HEMODYNAMICS:   VENTILATOR SETTINGS: Vent Mode:  [-] PRVC FiO2 (%):  [40 %-100 %] 50 % Set Rate:  [15 bmp] 15 bmp Vt Set:  [660 mL] 660 mL PEEP:  [5 cmH20] 5 cmH20 Plateau Pressure:  [16 cmH20-19 cmH20] 16 cmH20 INTAKE / OUTPUT: Intake/Output     09/12 0701 - 09/13 0700 09/13 0701 - 09/14 0700   I.V. (mL/kg)  1723.3 (18.3)    Blood 962.5    IV Piggyback     Total Intake(mL/kg) 2685.8 (28.5)    Urine (mL/kg/hr) 1550 (0.7)    Total Output 1550     Net +1135.8            PHYSICAL EXAMINATION: General:  Elderly M intubated Neuro:  Sedated on vent, RASS 0, nods appropriately, tries to talk  HEENT:  Sclera anicteric, conjunctiva pink, MMM, ETT present Neck: Trachea supple and midline, (-) LAN or JVD Cardiovascular:  RRR, NS1/S2, (-) MRG Lungs:  resps even non labored on full support, Coarse BS bilaterally  Abdomen:  S/NT/ND/(+)BS, large pulsatile mass in right mid abdomen Musculoskeletal:  (-) C/C/E Skin: large, marked hematoma of left groin and large marked hematoma of left shoulder, softer   LABS:  CBC  Recent Labs Lab 10/24/13 0635 10/24/13 1418 10/24/13 1958  WBC 22.0* 20.2* 21.3*  HGB 13.5 12.8* 12.9*  HCT 39.9 36.7* 37.6*  PLT 217 235 222   Coag's  Recent Labs Lab 10/23/13 0655 10/23/13 1524 10/24/13 0635  INR 1.04 1.28 1.48   BMET  Recent Labs Lab 10/22/13 0918 10/23/13 1524 10/24/13 0635  NA 140 141 140  K 4.1 4.6 4.6  CL 109 107 106  CO2  --  20 22  BUN CREATININE 1.20 1.11 1.14  GLUCOSE 106* 150* 156*   Electrolytes  Recent Labs Lab 10/23/13 1524 10/24/13 0635  CALCIUM 9.4 8.8   Sepsis Markers  Recent Labs Lab 10/24/13 0635 10/24/13 2300  LATICACIDVEN  2.4* 2.1   ABG No results found for this basename: PHART, PCO2ART, PO2ART,  in the last 168 hours Liver Enzymes  Recent Labs Lab 10/23/13 1524 10/24/13 0635 10/24/13 2300  AST ALT ALKPHOS 66 59 58  BILITOT 1.9* 1.6* 1.6*  ALBUMIN 3.8 3.4* 3.3*   Cardiac Enzymes  Recent Labs Lab 10/24/13 0635 10/24/13 1408 10/24/13 2300  TROPONINI <0.30 <0.30 <0.30   Glucose  Recent Labs Lab 10/24/13 0808 10/24/13 1149 10/24/13 1603 10/24/13 1954 10/25/13 0408  GLUCAP 139* 141* 127* 148* 114*    Imaging  CXR: pending 9/13  ASSESSMENT /  PLAN:  PULMONARY A: Acute respiratory failure 2/2 AMS:  P:   Low dose pressors, consider sbt, will assess abg prior tothis to ensure not sig acidosis F/u CXR in am  Will advance ett 3 cm today Sputum culture  CARDIOVASCULAR A: Near PEA arrest: Unclear etiology. Sequence of events suspicious for bleeding induced process although no RP bleed and intact AAA.  ? primary cardiac events although its hard to imagine ACS given presence of TPA - neg trop.  AAA Hypotension:  PVD with acute thrombus of LLE Bradycardia  P:   Cont pressor support and wean as able  Management of PVD per vascular Echo pending  F/u lactate, pct  Cont dopamine, to goal off, HR improved Cultures as below  Consider CVL for CVP - if nopt off pressors by am  Cortisol level  RENAL A: CKD: Cr at baseline P:   F/u chem Allow pos balance  GASTROINTESTINAL A: Elevated Bilirubin: Stable. ? gilbert's P:   F/u lft's Place OGT and consider feed PPI   HEMATOLOGIC A: Hypofibrinogenemia - fibrinogen cont trend UP 9/13 Acute Blood Loss anemia  P:   Close f/u cbc  S/p cryo , improved overall  INFECTIOUS A: Rising WBC - presumed r/t stress but given continued hypotension despite adequate Hgb, controlled bleeding will w/u for infectious process.  P:  Pan culture  Empiric abx - vanc zosyn  F/u lactate, pct may help to dc abx in 72 hrs  ENDOCRINE A: Hyperglycemia:  P:   SSI  NEUROLOGIC A: AMS: Likely 2/2 acute hemorrhage peripherally. No obvious focal signs. Head CT neg.  P:   Fentanyl  Daily WUA   TODAY'S SUMMARY: Marginally improved but remains on pressors.  Intermittent bradycardia.  ?etiology of continued shock despite stable Hgb, no RP bleed or other significant source, improving hematomas.  Purulent sputum - will pan culture, empiric abx, check lactic acid, pct.  Family updated at length at bedside.   I have personally obtained a history, examined the patient, evaluated laboratory and imaging  results, formulated the assessment and plan and placed orders.  CRITICAL CARE: The patient is critically ill with multiple organ systems failure and requires high complexity decision making for assessment and support, frequent evaluation and titration of therapies, application of advanced monitoring technologies and extensive interpretation of multiple databases. Critical Care Time devoted to patient care services described in this note is 30 minutes.   Dirk Dress, NP 10/25/2013  9:05 AM Pager: [phone] or [phone]  Mcarthur Rossetti. Tyson Alias, MD, FACP Pgr: [phone] [facility]

## 2013-10-26 ENCOUNTER — Inpatient Hospital Stay (HOSPITAL_COMMUNITY): Payer: Medicare Other

## 2013-10-26 ENCOUNTER — Encounter: Payer: Medicare Other | Admitting: Surgery

## 2013-10-26 DIAGNOSIS — I517 Cardiomegaly: Secondary | ICD-10-CM

## 2013-10-26 DIAGNOSIS — J9600 Acute respiratory failure, unspecified whether with hypoxia or hypercapnia: Secondary | ICD-10-CM

## 2013-10-26 DIAGNOSIS — I2510 Atherosclerotic heart disease of native coronary artery without angina pectoris: Secondary | ICD-10-CM

## 2013-10-26 DIAGNOSIS — R579 Shock, unspecified: Secondary | ICD-10-CM

## 2013-10-26 LAB — POCT I-STAT 3, ART BLOOD GAS (G3+)
Acid-base deficit: 2 mmol/L (ref 0.0–2.0)
Acid-base deficit: 1 mmol/L (ref 0.0–2.0)
Bicarbonate: 22.3 mEq/L (ref 20.0–24.0)
Bicarbonate: 23.7 mEq/L (ref 20.0–24.0)
O2 SAT: 96 %
O2 Saturation: 97 %
PCO2 ART: 36.3 mmHg (ref 35.0–45.0)
PO2 ART: 81 mmHg (ref 80.0–100.0)
TCO2: 23 mmol/L (ref 0–100)
TCO2: 25 mmol/L (ref 0–100)
pCO2 arterial: 34.3 mmHg — ABNORMAL LOW (ref 35.0–45.0)
pH, Arterial: 7.421 (ref 7.350–7.450)
pH, Arterial: 7.422 (ref 7.350–7.450)
pO2, Arterial: 91 mmHg (ref 80.0–100.0)

## 2013-10-26 LAB — CBC
HCT: 30.6 % — ABNORMAL LOW (ref 39.0–52.0)
Hemoglobin: 10.4 g/dL — ABNORMAL LOW (ref 13.0–17.0)
MCH: 31.4 pg (ref 26.0–34.0)
MCHC: 34 g/dL (ref 30.0–36.0)
MCV: 92.4 fL (ref 78.0–100.0)
Platelets: 149 10*3/uL — ABNORMAL LOW (ref 150–400)
RBC: 3.31 MIL/uL — ABNORMAL LOW (ref 4.22–5.81)
RDW: 13 % (ref 11.5–15.5)
WBC: 12.2 10*3/uL — ABNORMAL HIGH (ref 4.0–10.5)

## 2013-10-26 LAB — GLUCOSE, CAPILLARY
GLUCOSE-CAPILLARY: 109 mg/dL — AB (ref 70–99)
Glucose-Capillary: 110 mg/dL — ABNORMAL HIGH (ref 70–99)
Glucose-Capillary: 113 mg/dL — ABNORMAL HIGH (ref 70–99)
Glucose-Capillary: 97 mg/dL (ref 70–99)

## 2013-10-26 LAB — BASIC METABOLIC PANEL
Anion gap: 11 (ref 5–15)
BUN: 26 mg/dL — ABNORMAL HIGH (ref 6–23)
CALCIUM: 8.4 mg/dL (ref 8.4–10.5)
CO2: 21 mEq/L (ref 19–32)
CREATININE: 0.97 mg/dL (ref 0.50–1.35)
Chloride: 104 mEq/L (ref 96–112)
GFR calc Af Amer: >90 mL/min (ref >90)
GFR, EST NON AFRICAN AMERICAN: 82 mL/min — AB (ref >90)
GLUCOSE: 121 mg/dL — AB (ref 70–99)
Potassium: 3.7 mEq/L (ref 3.7–5.3)
Sodium: 136 mEq/L — ABNORMAL LOW (ref 137–147)

## 2013-10-26 LAB — HEPARIN LEVEL (UNFRACTIONATED)

## 2013-10-26 LAB — FIBRINOGEN
FIBRINOGEN: 557 mg/dL — AB (ref 204–475)
Fibrinogen: 605 mg/dL — ABNORMAL HIGH (ref 204–475)
Fibrinogen: 679 mg/dL — ABNORMAL HIGH (ref 204–475)
Fibrinogen: 679 mg/dL — ABNORMAL HIGH (ref 204–475)

## 2013-10-26 LAB — TSH: TSH: 4.92 u[IU]/mL — AB (ref 0.350–4.500)

## 2013-10-26 LAB — PROCALCITONIN: PROCALCITONIN: 0.25 ng/mL

## 2013-10-26 LAB — CORTISOL: CORTISOL PLASMA: 24.6 ug/dL

## 2013-10-26 MED ORDER — PRO-STAT SUGAR FREE PO LIQD
30.0000 mL | Freq: Three times a day (TID) | ORAL | Status: DC
Start: 2013-10-26 — End: 2013-10-26
  Filled 2013-10-26 (×2): qty 30

## 2013-10-26 MED ORDER — OSMOLITE 1.2 CAL PO LIQD
1000.0000 mL | ORAL | Status: DC
  Filled 2013-10-26 (×2): qty 1000

## 2013-10-26 MED ORDER — METOPROLOL TARTRATE 25 MG PO TABS
25.0000 mg | ORAL_TABLET | Freq: Two times a day (BID) | ORAL | Status: DC
  Administered 2013-10-26 – 2013-11-16 (×36): 25 mg via ORAL
  Filled 2013-10-26 (×43): qty 1

## 2013-10-26 NOTE — Progress Notes (Addendum)
INITIAL NUTRITION ASSESSMENT  DOCUMENTATION CODES Per approved criteria  -Not Applicable   INTERVENTION: Initiate Osmolite 1.2 @ 40 ml/hr via OGT and increase by 10 ml every 4 hours to goal rate of 60 ml/hr.   30 ml Prostat TID.    Tube feeding regimen provides 2028 kcal (100% of estimated needs), 125 grams of protein (100% of estimated needs), and 1181 ml of H2O.   NUTRITION DIAGNOSIS: Inadequate oral intake related to inability to eat as evidenced by NPO.   Goal: Pt to meet >/= 90% of their estimated nutrition needs   Monitor:  TF toleration, labs, weight, vent status/settings  Reason for Assessment: consult for TF initiation and management  68 y.o. male  Admitting Dx: Lower limb ischemia  ASSESSMENT: 60 M with ischemic left foot 1 day status post placement of thrombolytic catheter in L femoral artery. AM of 9/12 developed bradycardia in conjunction with sudden appearance of hematomas of L should and L groin. PCCM consulted for emergent management.   - Pt intubated 9/12 - Pt currently being weaned. Per RN, questionable wether pt will be able to be extubated.   Patient is currently intubated on ventilator support MV: 11.5 L/min Temp (24hrs), Avg:98.6 F (37 C), Min:97 F (36.1 C), Max:100.7 F (38.2 C)  Patient has OGT in place with tip of tube in stomach. Vital AF is infusing @ 40 ml/hr. Tube feeding regimen currently providing 960 kcal (47% of estimated needs), 84 grams protein (73% of estimated needs), and 806 ml H2O.   Labs: CBGs: 109-113 Na Low BUN elevated  Height: Ht Readings from Last 1 Encounters:  10/23/13 6' 2.02" (1.88 m)    Weight: Wt Readings from Last 1 Encounters:  10/26/13 211 lb 10.3 oz (96 kg)    Ideal Body Weight: 82.2 kg  % Ideal Body Weight: 117%  Wt Readings from Last 10 Encounters:  10/26/13 211 lb 10.3 oz (96 kg)  10/23/13 208 lb (94.348 kg)  10/22/13 209 lb (94.802 kg)  10/22/13 209 lb (94.802 kg)  10/21/13 206 lb (93.441  kg)  08/03/13 202 lb (91.627 kg)  06/23/13 207 lb (93.895 kg)  12/01/12 194 lb 9.6 oz (88.27 kg)  12/01/12 194 lb 9.6 oz (88.27 kg)  07/28/12 203 lb (92.08 kg)    Usual Body Weight: 206-209 lbs  % Usual Body Weight: 101%  BMI:  Body mass index is 27.16 kg/(m^2).  Estimated Nutritional Needs: Kcal: 2024 Protein: 115-130 g Fluid: per MD  Skin: puncture on left groin  Diet Order: NPO  EDUCATION NEEDS: -Education not appropriate at this time   Intake/Output Summary (Last 24 hours) at 10/26/13 1528 Last data filed at 10/26/13 1500  Gross per 24 hour  Intake 2460.01 ml  Output   1680 ml  Net 780.01 ml    Last BM: prior to admission   Labs:   Recent Labs Lab 10/24/13 0635 10/25/13 1200 10/26/13 0339  NA 140 140 136*  K 4.6 4.5 3.7  CL 106 106 104  CO2 BUN 23 26* 26*  CREATININE 1.14 1.04 0.97  CALCIUM 8.8 8.5 8.4  GLUCOSE 156* 100* 121*    CBG (last 3)   Recent Labs  10/26/13 0426 10/26/13 0831 10/26/13 1202  GLUCAP 113* 110* 109*    Scheduled Meds: . sodium chloride   Intravenous Once  . sodium chloride   Intravenous Once  . antiseptic oral rinse  7 mL Mouth Rinse QID  . chlorhexidine  15 mL Mouth  Rinse BID  . feeding supplement (VITAL HIGH PROTEIN)  1,000 mL Per Tube Q24H  . insulin aspart  1-3 Units Subcutaneous 6 times per day  . metoprolol tartrate  25 mg Oral BID  . nicotine  7 mg Transdermal Daily  . pantoprazole (PROTONIX) IV  40 mg Intravenous Q24H  . piperacillin-tazobactam (ZOSYN)  IV  3.375 g Intravenous 3 times per day  . potassium chloride  20-40 mEq Oral Once  . sodium chloride  2,000 mL Intravenous Once  . sodium chloride  3 mL Intravenous Q12H  . vancomycin  1,250 mg Intravenous Q12H    Continuous Infusions: . DOPamine Stopped (10/25/13 1430)  . fentaNYL infusion INTRAVENOUS Stopped (10/26/13 0900)  . midazolam (VERSED) infusion Stopped (10/26/13 0900)  . phenylephrine (NEO-SYNEPHRINE) Adult infusion 10 mcg/min  (10/26/13 1506)    Past Medical History  Diagnosis Date  . Abdominal aortic aneurysm   . Popliteal aneurysm   . Hyperlipidemia   . Hypertension   . Coronary artery disease   . Detached retina   . Myocardial infarction     2007 heart stent  . Chronic kidney disease     Right kidney stone    Past Surgical History  Procedure Laterality Date  . Coronary stenting      2007  . Abdominal aortic aneurysm repair  11/24/09    EVAR  . Popliteal artery stent  11/2009    left popliteal artery   . Retinal detachment surgery Right 2011  . Tooth extraction  June 2015    Wisdom Tooth Extraction    Ebbie Latus RD, LDN

## 2013-10-26 NOTE — Progress Notes (Signed)
Patient Name: [name] [name] Date of Encounter: 10/26/2013  Principal Problem:   Lower limb ischemia Active Problems:   Popliteal artery occlusion, left   Bradycardia    Patient Profile: 68 yo male w/ hx BMS RCA 2007, AAA & L pop endovasc repair 2011, HTN, HLD, admitted 09/11 for L SFA 100%, rx w/ TNK/heparin, developed hematoma and shock. Intubated. Still on pressors. Cards following for bradycardia, HR 20s during acute event, now consistently > 40.  SUBJECTIVE: Patient remains intubated. Seems to be resting comfortably. Denies CP, palpitations or dizziness overnight.   OBJECTIVE Filed Vitals:   10/26/13 0745 10/26/13 0800 10/26/13 0815 10/26/13 0833  BP: 101/70 103/52    Pulse: 62 64 67   Temp:    98.5 F (36.9 C)  TempSrc:    Oral  Resp: Height:      Weight:      SpO2: 98% 98% 98%     Intake/Output Summary (Last 24 hours) at 10/26/13 0923 Last data filed at 10/26/13 0800  Gross per 24 hour  Intake 2818.94 ml  Output   1765 ml  Net 1053.94 ml   Filed Weights   10/23/13 1305 10/26/13 0441  Weight: 207 lb 14.3 oz (94.3 kg) 211 lb 10.3 oz (96 kg)    PHYSICAL EXAM General: Well developed, well nourished, male in no acute distress. Intubated  Head: Normocephalic, atraumatic.  Neck: Supple without bruits, JVD not elevated. Lungs:  Resp regular and unlabored, CTA. Heart: RRR, S1, S2, no S3, S4, or murmur; no rub. Abdomen: Soft, non-tender, non-distended, BS + x 4.  Extremities: No clubbing, cyanosis, no edema. Leftlower extremity is cold to the touch with delayed cap refill. Absent DT and PT pulses in the LLE. SCD on right LE not disturbed. 1+ DT and PT in the RLE and UE bilaterally.  Neuro: Alert and oriented X 3. Moves all extremities spontaneously. Psych: Normal affect. Skin: large ecchymotic areas LUE, LLE with extensive areas of ecchymosis, hematomas noted but outer areas are soft, seem to be improving per reports   LABS: CBC: Recent Labs  10/23/13 1524 10/24/13 0635  10/25/13 1200 10/26/13 0339  WBC 14.0* 22.0*  < > 15.2* 12.2*  NEUTROABS  --  19.3*  --   --   --   HGB 15.1 13.5  < > 11.8* 10.4*  HCT 44.6 39.9  < > 34.3* 30.6*  MCV 94.9 93.4  < > 91.7 92.4  PLT 196 217  < > 178 149*  < > = values in this interval not displayed. INR: Recent Labs  10/24/13 0635  INR 1.48   Basic Metabolic Panel: Recent Labs  10/25/13 1200 10/26/13 0339  NA 140 136*  K 4.5 3.7  CL 106 104  CO2 22 21  GLUCOSE 100* 121*  BUN 26* 26*  CREATININE 1.04 0.97  CALCIUM 8.5 8.4   Liver Function Tests: Recent Labs  10/24/13 0635 10/24/13 2300  AST 19 19  ALT 19 17  ALKPHOS 59 58  BILITOT 1.6* 1.6*  PROT 6.0 6.2  ALBUMIN 3.4* 3.3*   Cardiac Enzymes: Recent Labs  10/24/13 0635 10/24/13 1408 10/24/13 2300  TROPONINI <0.30 <0.30 <0.30  Hemoglobin A1C: Recent Labs  10/24/13 1200  HGBA1C 5.6   Thyroid Function Tests: pending  TELE: Sinus Rhythm with rare PVCs. Rates in the mid 60s. Occasional sinus brady when asleep.   Radiology/Studies: Ct Head Wo Contrast 10/24/2013   CLINICAL DATA:  Altered  mental status  EXAM: CT HEAD WITHOUT CONTRAST  TECHNIQUE: Contiguous axial images were obtained from the base of the skull through the vertex without intravenous contrast.  COMPARISON:  None.  FINDINGS: Mild to moderate age-related cortical atrophy. No evidence of hemorrhage or extra-axial fluid. No evidence of infarct or mass. No hydrocephalus. Calvarium intact. No significant inflammatory change in the visualized portions of the paranasal sinuses.  IMPRESSION: No acute intracranial abnormalities.   Electronically Signed   By: Esperanza Heir M.D.   On: 10/24/2013 10:09   Dg Chest Port 1 View 10/26/2013   CLINICAL DATA:  Followup respiratory failure  EXAM: PORTABLE CHEST - 1 VIEW  COMPARISON:  10/25/2013  FINDINGS: Cardiomediastinal silhouette is stable. Stable endotracheal and NG tube position. Worsening atelectasis or infiltrate  in right middle lobe. Question small right pleural effusion. Left lung is clear. No pulmonary edema.  IMPRESSION: Worsening atelectasis or infiltrate in right middle lobe. Stable support apparatus. Question small right pleural effusion.   Electronically Signed   By: Natasha Mead M.D.   On: 10/26/2013 08:11   Dg Chest Port 1 View 10/25/2013   CLINICAL DATA:  Evaluate endotracheal tube placement.  EXAM: PORTABLE CHEST - 1 VIEW  COMPARISON:  Chest x-ray 10/24/2013.  FINDINGS: An endotracheal tube is in place with tip 7.5 cm above the carina. Transcutaneous defibrillator pads projecting over the mid and lower left hemithorax. Lung volumes are low, with probable partial atelectasis of the right middle lobe. No acute consolidative airspace disease. No pleural effusions. No evidence of pulmonary edema. Heart size and mediastinal contours are within normal limits.  IMPRESSION: 1. Support apparatus, as above. 2. Low lung volumes with partial atelectasis of the right middle lobe.   Electronically Signed   By: Trudie Reed M.D.   On: 10/25/2013 09:25   Dg Abd Portable 1v 10/25/2013   CLINICAL DATA:  Orogastric tube placement.  EXAM: PORTABLE ABDOMEN - 1 VIEW  COMPARISON:  12/29/2012.  FINDINGS: The distal end of an orogastric tube is noted with tip in the mid body of the stomach. Visualized bowel gas pattern is nonobstructive. And aorta bi-iliac stent graft is noted. Defibrillator pad projecting over the left side of the abdomen.  IMPRESSION: 1. Tip of orogastric tube is in the mid body of the stomach.   Electronically Signed   By: Trudie Reed M.D.   On: 10/25/2013 15:19   Ct Angio Abd/pel W/ And/or W/o 10/24/2013   CLINICAL DATA:  hemoharge in setting TPA use, possible enlarging AAA  EXAM: CT ANGIOGRAPHY ABDOMEN AND PELVIS  TECHNIQUE: Multidetector CT imaging of the abdomen and pelvis was performed using the standard protocol during bolus administration of intravenous contrast. Multiplanar reconstructed images  including MIPs were obtained and reviewed to evaluate the vascular anatomy.  CONTRAST:  80mL OMNIPAQUE IOHEXOL 350 MG/ML SOLN  COMPARISON:  11/21/2012 and earlier studies  FINDINGS: ARTERIAL FINDINGS:  Aorta: Visualized distal descending thoracic and suprarenal segments unremarkable. Patent bifurcated infrarenal aortic stent graft. No evidence of endoleak. Native aneurysm diameter 5.6 cm on image 133/601, previously 5.7 cm by my measurement.  Celiac axis:          Patent  Superior mesenteric:  Patent, with classic distal branch anatomy.  Left renal:           Single, patent.  Right renal:          Duplicated, inferior dominant, both patent.  Inferior mesenteric:  Origin occlusion.  Left iliac: Left limb of the stent graft  extends to the mid common iliac artery. Mild scattered plaque in the distal native common iliac artery and internal iliac artery. External iliac is widely patent. Common femoral artery is widely patent. There is a 9 mm pseudoaneurysm from the distal common femoral. Profunda femoris branches patent. Visualized proximal SFA patent. Antegrade arterial sheath is in the proximal SFA.  Right iliac: Right limb of the stent graft extends to the distal common iliac artery. Minimal plaque in the native internal iliac artery which is mildly ectatic -8 mm diameter. External iliac and common femoral artery patent. Visualized portions of proximal profunda femoris and SFA patent.  Venous findings:      Venous phase not obtained.  Review of the MIP images confirms the above findings.  Nonvascular findings: Minimal dependent atelectasis posteriorly in the visualized lung bases. Partially calcified subcentimeter stone in the dependent aspect of the nondilated gallbladder. 16 mm cyst in hepatic segment 3. Unremarkable spleen, adrenal glands, pancreas. Stable hepatic cysts. No hydronephrosis. Stomach, small bowel, and colon are nondilated.Normal appendix. A few scattered sigmoid diverticula without adjacent  inflammatory/edematous change. Urinary bladder is partially decompressed by Foley catheter. Marked prostatic enlargement with central coarse calcifications. Minimal spondylitic changes in the visualized lower thoracic and lumbar spine.  There is no retroperitoneal or pelvic hematoma. There is a large amount of probable blood throughout the subcutaneous tissues of the anterior right thigh.  IMPRESSION: 1. Patent infrarenal bifurcated aortic stent graft without endoleak. Slight decrease in native aneurysm sac diameter, 5.6 cm. 2. 9mm pseudoaneurysm from left common femoral artery with large anterior thigh hematoma. 3. No evidence of retroperitoneal or pelvic hematoma. Critical Value/emergent results were called by telephone at the time of interpretation on 10/24/2013 at 11:02 am to Dr. Imogene Burn, who verbally acknowledged these results.  4. Cholelithiasis 5. Marked prostatic enlargement.   Electronically Signed   By: Oley Balm M.D.   On: 10/24/2013 11:02   Ir Rande Lawman F/u Eval Art/ven Final Day (ms) 10/24/2013   CLINICAL DATA:  Left Popliteal aneurysm, post stent graft placement in 2011. Stent occlusion. Now post overnight catheter directed thrombolytic infusion. The infusion was terminated at approximately 6:45 a.m. because of a large left thigh hematoma.  EXAM: LEFT LOWER EXTREMITY ARTERIOGRAM THROUGH EXISTING CATHETER  TECHNIQUE: Contrast injected through the previously placed antegrade left femoral sheaths for left lower extremity arteriography. The infusion catheter and wire were removed. The sheath was left in place at the patient returned to the ICU.  FINDINGS: SFA is patent with mild atheromatous irregularity, no stenosis. Long segment popliteal artery occlusion from above the stent graft. Small geniculate collaterals cross the knee, reconstituting only a short segment of a proximal calf tibial vessel. No named arteries are identified below the midcalf level.  IMPRESSION: 1. Persistent occlusion of the  popliteal artery without significant collateral reconstitution distally. Critical Value/emergent results were called by telephone at the time of interpretation on 10/24/2013 to Dr. Imogene Burn who verbally acknowledged these results.   Electronically Signed   By: Oley Balm M.D.   On: 10/24/2013 11:09   Current Medications:  . sodium chloride   Intravenous Once  . sodium chloride   Intravenous Once  . antiseptic oral rinse  7 mL Mouth Rinse QID  . chlorhexidine  15 mL Mouth Rinse BID  . feeding supplement (VITAL HIGH PROTEIN)  1,000 mL Per Tube Q24H  . insulin aspart  1-3 Units Subcutaneous 6 times per day  . nicotine  7 mg Transdermal Daily  . pantoprazole (PROTONIX) IV  40 mg Intravenous Q24H  . piperacillin-tazobactam (ZOSYN)  IV  3.375 g Intravenous 3 times per day  . potassium chloride  20-40 mEq Oral Once  . sodium chloride  2,000 mL Intravenous Once  . sodium chloride  3 mL Intravenous Q12H  . vancomycin  1,250 mg Intravenous Q12H   . DOPamine Stopped (10/25/13 1430)  . fentaNYL infusion INTRAVENOUS 50 mcg/hr (10/26/13 0800)  . midazolam (VERSED) infusion 1 mg/hr (10/26/13 0800)  . phenylephrine (NEO-SYNEPHRINE) Adult infusion 50 mcg/min (10/26/13 0800)    ASSESSMENT AND PLAN: Principal Problem:   Lower limb ischemia- per vascular and CCM  Active Problems:   Popliteal artery occlusion, left- per vascular and CCM    Bradycardia- Sinus bradycardia with rates briefly in the 50s overnight. Troponins negative, no objective evidence of ischemia, no ischemic symptoms, TSH ordered. Still on pressors, continue to wean as tolerated per CCM. Bradycardic event etiology thought to be a vagal response. No plans to call EP consult at this time, reconsider if pt has symptomatic bradycardia following resolution of acute event.    Weight is up 4lbs since admission date and office visit in 06/2013. Could be due to the difference in bed scale vs standing scale. Does not appear fluid overloaded on  exam. Contine to monitor fluid status.   Signed, Daine Floras, PA-S2   Seen and agree with changes made. Theodore Demark , PA-C 9:23 AM 10/26/2013  I have seen and examined the patient along with Theodore Demark , PA-C.  I have reviewed the chart, notes and new data.  I agree with PA's note.  Key new complaints: intubated - hopefully for extubation today Key examination changes: both feet with good perfusion; HR is 70 bpm, quickly increases to 100 bpm with stimulation   PLAN: Restart beta blocker to avoid risk of rebound tachycardia/HTN. This is an important long term medication for him.  Thurmon Fair, MD, [facility] [phone] 10/26/2013, 11:30 AM

## 2013-10-26 NOTE — Progress Notes (Addendum)
     Subjective  - Intubated alert, appears comfortable.   Objective 91/56 64 98.3 F (36.8 C) (Oral) 16 98%  Intake/Output Summary (Last 24 hours) at 10/26/13 4098 Last data filed at 10/26/13 0700  Gross per 24 hour  Intake 2763.84 ml  Output   1810 ml  Net 953.84 ml    Left PT/DP no doppler signals Foot cool to touch compared to right foot without ischemic changes   Assessment/Planning: Persistent occlusion of the popliteal artery without significant collateral reconstitution distally.   POD #3 Successful ANTEGRADE LLE angio and infusion cath insertion across occluded popliteal stent into the posterior tibial artery TNK LYSIS   10/24/2013 625 am Sudden left groin hematoma formation and pallor and shock.Dc TKNAase.  Near Bradycardic Arrest  Dc heparin  dC antihypertensive  Fluid bolus + neo + Type and cross + 2 unit probc Troponin neg Intubated on vasopressors Attempted wean off venterlator     No left foot signals.  We will order vein mapping of the lower extremities, carotid duplex and ask for cardiac clearance for Left LE by-pass later this week.     Clinton Gallant [facility] 10/26/2013 8:08 AM --  Laboratory Lab Results:  Recent Labs  10/25/13 1200 10/26/13 0339  WBC 15.2* 12.2*  HGB 11.8* 10.4*  HCT 34.3* 30.6*  PLT 178 149*   BMET  Recent Labs  10/25/13 1200 10/26/13 0339  NA 140 136*  K 4.5 3.7  CL 106 104  CO2 22 21  GLUCOSE 100* 121*  BUN 26* 26*  CREATININE 1.04 0.97  CALCIUM 8.5 8.4    COAG Lab Results  Component Value Date   INR 1.48 10/24/2013   INR 1.28 10/23/2013   INR 1.04 10/23/2013   No results found for this basename: PTT    I have seen and evaluated the patient.  The events of this weekend were noted.  The patient remains intubated, however does open his eyes to voice.  He can wiggle his toes on the left and 6 his head stating that he has sensation intact.  I was able to Doppler a very weak monophasic left  posterior tibial signal.  This potentially could have been a venous signal.  The patient was unable to complete the full from a lysis attempt secondary to hematoma development in the left groin as well as the left arm.  The thrombosed popliteal stent remains occluded.  The patient will need some form of revascularization once he stabilizes.  Hopefully he can be extubated today.  Once the hematoma in his leg starts to improve I will consider femoral popliteal bypass grafting.  He will need to have vein mapping of his leg to see if he has an adequate conduit.  Durene Cal

## 2013-10-26 NOTE — Procedures (Signed)
Extubation Procedure Note  Patient Details:   Name: [name] [name] DOB: 1945-04-10 MRN: [id number]   Airway Documentation:  Airway 8 mm (Active)  Secured at (cm) 28 cm 10/26/2013 10:15 AM  Measured From Lips 10/26/2013 10:15 AM  Secured Location Left 10/26/2013 10:15 AM  Secured By [employer] 10/26/2013 10:15 AM  Tube Holder Repositioned Yes 10/26/2013 10:15 AM  Cuff Pressure (cm H2O) 24 cm H2O 10/26/2013 10:15 AM  Site Condition Dry 10/26/2013 10:15 AM    Evaluation  O2 sats: stable throughout Complications: No apparent complications Patient did tolerate procedure well. Bilateral Breath Sounds: Clear Suctioning: Oral;Airway Yes, pt. weaning very well since 1010 this a.m., able to lift head off bed on own, (+) cuff leak noted, NIF-(-20)cmH20, FVC(1.4)L, order placed per Dr. Vassie Loll for Extubation, suctioned prior, placed on 4 lpm n/c humidified Oxygen s/p extubation with no difficulty or Stridor noted, RT to monitor.   Joylene John 10/26/2013, 4:45 PM

## 2013-10-26 NOTE — Progress Notes (Signed)
PULMONARY  / CRITICAL CARE MEDICINE    Name: [name] [name] MRN: [id number] DOB: [birth year]    ADMISSION DATE:  10/23/2013 CONSULTATION DATE:  10/24/2013  REQUESTING CLINICIAN: Dr. Imogene Burn  PRIMARY SERVICE: Vascular  CHIEF COMPLAINT:  Near Bradycardic Arrest  BRIEF PATIENT DESCRIPTION: 46 M with ischemic left foot 1 day status post placement of thrombolytic catheter in L femoral artery. AM of 9/12 developed bradycardia in conjunction with sudden appearance of hematomas of L shoulder and L groin. PCCM consulted for emergent management.   SIGNIFICANT EVENTS / STUDIES:  9/11 L femoral catheter with TPA infusion started 9/12 Near PEA arrest with acute hematomas, intubated, shock CT head 9/12>>neg  CTA abd/pelvis 9/12>>> AAA no leak, 9mm pseudoaneurysm from left common femoral artery with large anterior thigh hematoma. No evidence of retroperitoneal or pelvic hematoma. Arteriogram LLE 9/12>>> Persistent occlusion of the popliteal artery without significant collateral reconstitution distally.   LINES / TUBES: PIVs ETT 9/12  CULTURES: BCx2 9/13>>> U/a 9/13>>>  ANTIBIOTICS: vanc 9/13>>> Zosyn 9/13>>>   SUBJECTIVE:   Remains critically ill on low dose neo gtt.  Sedated on fent & versed gtt Afebrile Good UO   VITAL SIGNS: Temp:  [97 F (36.1 C)-101.6 F (38.7 C)] 98.5 F (36.9 C) (09/14 0833) Pulse Rate:  [59-116] 67 (09/14 0815) Resp:  [13-26] 18 (09/14 0815) BP: (84-131)/(41-70) 103/52 mmHg (09/14 0800) SpO2:  [92 %-100 %] 98 % (09/14 0815) Arterial Line BP: (106-163)/(28-78) 109/46 mmHg (09/14 0830) FiO2 (%):  [40 %-50 %] 40 % (09/14 0800) Weight:  [96 kg (211 lb 10.3 oz)] 96 kg (211 lb 10.3 oz) (09/14 0441) HEMODYNAMICS:   VENTILATOR SETTINGS: Vent Mode:  [-] PRVC FiO2 (%):  [40 %-50 %] 40 % Set Rate:  [16 bmp] 16 bmp Vt Set:  [660 mL] 660 mL PEEP:  [5 cmH20] 5 cmH20 Pressure Support:  [10 cmH20] 10 cmH20 Plateau Pressure:  [17 cmH20-18 cmH20] 17 cmH20 INTAKE /  OUTPUT: Intake/Output     09/13 0701 - 09/14 0700 09/14 0701 - 09/15 0700   I.V. (mL/kg) 1383.8 (14.4) 35.1 (0.4)   Blood     NG/GT 500 40   IV Piggyback 900    Total Intake(mL/kg) 2783.8 (29) 75.1 (0.8)   Urine (mL/kg/hr) 2020 (0.9) 70 (0.3)   Total Output 2020 70   Net +763.8 +5.1          PHYSICAL EXAMINATION: General:  Elderly M intubated Neuro:  Sedated on vent, RASS 0, moves both feet HEENT:  Sclera anicteric, conjunctiva pink, MMM, ETT present Neck: Trachea supple and midline, (-) LAN or JVD Cardiovascular:  RRR, NS1/S2, (-) MRG Lungs:  resps even non labored on full support,  Abdomen:  S/NT/ND/(+)BS, large pulsatile mass in right mid abdomen Musculoskeletal:  (-) C/C/E Skin: large, marked hematoma of left groin and large marked hematoma of left shoulder, not worse from 9/13  LABS:  CBC  Recent Labs Lab 10/24/13 1958 10/25/13 1200 10/26/13 0339  WBC 21.3* 15.2* 12.2*  HGB 12.9* 11.8* 10.4*  HCT 37.6* 34.3* 30.6*  PLT 222 178 149*   Coag's  Recent Labs Lab 10/23/13 0655 10/23/13 1524 10/24/13 0635  INR 1.04 1.28 1.48   BMET  Recent Labs Lab 10/24/13 0635 10/25/13 1200 10/26/13 0339  NA 140 140 136*  K 4.6 4.5 3.7  CL 106 106 104  CO2 BUN 23 26* 26*  CREATININE 1.14 1.04 0.97  GLUCOSE 156* 100* 121*   Electrolytes  Recent  Labs Lab 10/24/13 0635 10/25/13 1200 10/26/13 0339  CALCIUM 8.8 8.5 8.4   Sepsis Markers  Recent Labs Lab 10/24/13 0635 10/24/13 2300 10/25/13 0913 10/25/13 1200 10/26/13 0340  LATICACIDVEN 2.4* 2.1  --  1.4  --   PROCALCITON  --   --  0.20  --  0.25   ABG  Recent Labs Lab 10/25/13 1209 10/26/13 0347  PHART 7.425 7.421  PCO2ART 34.4* 34.3*  PO2ART 71.0* 81.0   Liver Enzymes  Recent Labs Lab 10/23/13 1524 10/24/13 0635 10/24/13 2300  AST ALT ALKPHOS 66 59 58  BILITOT 1.9* 1.6* 1.6*  ALBUMIN 3.8 3.4* 3.3*   Cardiac Enzymes  Recent Labs Lab 10/24/13 0635  10/24/13 1408 10/24/13 2300  TROPONINI <0.30 <0.30 <0.30   Glucose  Recent Labs Lab 10/25/13 1216 10/25/13 1640 10/25/13 1943 10/26/13 0027 10/26/13 0426 10/26/13 0831  GLUCAP 103* 70 93 97 113* 110*    Imaging  CXR: pending 9/13  ASSESSMENT / PLAN:  PULMONARY A: Acute respiratory failure 2/2 AMS:  RLL atx P:   SBTs with goal extubation Follow Sputum culture  CARDIOVASCULAR A: Near PEA arrest (sinus brady): Unclear etiology. Sequence of events suspicious for bleeding induced process although no RP bleed and intact AAA.  ? primary cardiac events although its hard to imagine ACS given presence of TPA - neg trop.  AAA Hypotension:  PVD with acute thrombus of LLE Bradycardia  P:   Management of PVD per vascular -bypass planned Echo pending  Consider CVL for CVP - if not off pressors soon   RENAL A:At risk AKI  P:   F/u chem Allow pos balance  GASTROINTESTINAL A: Elevated Bilirubin: Stable. ? gilbert's P:   F/u lft's PPI   HEMATOLOGIC A: Hypofibrinogenemia - fibrinogen cont trend UP 9/13, S/p cryo  Acute Blood Loss anemia  P:   Close f/u cbc    INFECTIOUS A: Rising WBC - presumed r/t stress but given continued hypotension despite adequate Hgb, controlled bleeding will w/u for infectious process.  P:  Pan culture  Empiric abx - vanc zosyn  -stop once clinical improvement   ENDOCRINE A: Hyperglycemia:  P:   SSI  NEUROLOGIC A: AMS: Likely 2/2 acute hemorrhage peripherally. No obvious focal signs. Head CT neg.  P:   Fentanyl gt Dc versed Daily WUA   TODAY'S SUMMARY: Marginally improved but remains on pressors.  Intermittent bradycardia.  ?etiology of continued shock despite stable Hgb, no RP bleed or other significant source, improving hematomas.  Purulent sputum - doubt sepsis Hope to extubate today   I have personally obtained a history, examined the patient, evaluated laboratory and imaging results, formulated the assessment and  plan and placed orders.  CRITICAL CARE: The patient is critically ill with multiple organ systems failure and requires high complexity decision making for assessment and support, frequent evaluation and titration of therapies, application of advanced monitoring technologies and extensive interpretation of multiple databases. Critical Care Time devoted to patient care services described in this note is 35 minutes.   Cyril Mourning MD. Tonny Bollman. [facility] Pager [phone] If no response call [phone]    10/26/2013  9:09 AM

## 2013-10-26 NOTE — Progress Notes (Signed)
*  PRELIMINARY RESULTS* Echocardiogram 2D Echocardiogram has been performed.  Jeryl Columbia 10/26/2013, 11:10 AM

## 2013-10-27 ENCOUNTER — Encounter (HOSPITAL_COMMUNITY): Payer: Self-pay

## 2013-10-27 LAB — CBC
HCT: 29.3 % — ABNORMAL LOW (ref 39.0–52.0)
Hemoglobin: 10.3 g/dL — ABNORMAL LOW (ref 13.0–17.0)
MCH: 31.7 pg (ref 26.0–34.0)
MCHC: 35.2 g/dL (ref 30.0–36.0)
MCV: 90.2 fL (ref 78.0–100.0)
Platelets: 139 10*3/uL — ABNORMAL LOW (ref 150–400)
RBC: 3.25 MIL/uL — ABNORMAL LOW (ref 4.22–5.81)
RDW: 12.6 % (ref 11.5–15.5)
WBC: 9.1 10*3/uL (ref 4.0–10.5)

## 2013-10-27 LAB — VANCOMYCIN, TROUGH: Vancomycin Tr: 10.3 ug/mL (ref 10.0–20.0)

## 2013-10-27 LAB — PROCALCITONIN: PROCALCITONIN: 0.15 ng/mL

## 2013-10-27 LAB — GLUCOSE, CAPILLARY: Glucose-Capillary: 122 mg/dL — ABNORMAL HIGH (ref 70–99)

## 2013-10-27 LAB — FIBRINOGEN
FIBRINOGEN: 679 mg/dL — AB (ref 204–475)
Fibrinogen: 709 mg/dL — ABNORMAL HIGH (ref 204–475)
Fibrinogen: 699 mg/dL — ABNORMAL HIGH (ref 204–475)

## 2013-10-27 MED ORDER — VANCOMYCIN HCL 10 G IV SOLR
1750.0000 mg | Freq: Two times a day (BID) | INTRAVENOUS | Status: DC
  Administered 2013-10-27 – 2013-11-01 (×8): 1750 mg via INTRAVENOUS
  Filled 2013-10-27 (×12): qty 1750

## 2013-10-27 MED ORDER — CETYLPYRIDINIUM CHLORIDE 0.05 % MT LIQD
7.0000 mL | Freq: Two times a day (BID) | OROMUCOSAL | Status: DC
  Administered 2013-10-27 – 2013-11-14 (×28): 7 mL via OROMUCOSAL

## 2013-10-27 NOTE — Progress Notes (Signed)
Utilization Review Completed.  

## 2013-10-27 NOTE — H&P (Signed)
History and Physical  Patient name: [name] [name] MRN: [id number] DOB: 04-03-1945 Sex: male   Reason for Admission: Left leg pain  HISTORY OF PRESENT ILLNESS: The patient is status post endovascular repair of abdominal aortic aneurysm in October 2011.  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  He presented to the office with a six-day history of leg pain.  He underwent angiography on 10/22/2013 and was found to have thrombosis of his left popliteal stent.    Past Medical History  Diagnosis Date  . Abdominal aortic aneurysm   . Popliteal aneurysm   . Hyperlipidemia   . Hypertension   . Coronary artery disease   . Detached retina   . Myocardial infarction     2007 heart stent  . Chronic kidney disease     Right kidney stone    Past Surgical History  Procedure Laterality Date  . Coronary stenting      2007  . Abdominal aortic aneurysm repair  11/24/09    EVAR  . Popliteal artery stent  11/2009    left popliteal artery   . Retinal detachment surgery Right 2011  . Tooth extraction  June 2015    Wisdom Tooth Extraction    History   Social History  . Marital Status: Married    Spouse Name: N/A    Number of Children: N/A  . Years of Education: N/A   Occupational History  . Retired    Social History Main Topics  . Smoking status: Former Smoker -- 15 years    Types: Cigarettes    Quit date: 09/21/1985  . Smokeless tobacco: Current User    Types: Chew  . Alcohol Use: No  . Drug Use: No  . Sexual Activity: Not on file   Other Topics Concern  . Not on file   Social History Narrative   Lives with family.    Family History  Problem Relation Age of Onset  . Aneurysm Sister     possibly   . Peripheral vascular disease Brother     Allergies as of 10/23/2013 - Review Complete 10/23/2013  Allergen Reaction Noted  . Omnipaque [iohexol] Rash 03/27/2010  . Oxytetracycline Rash 10/11/2009    No current  facility-administered medications on file prior to encounter.   Current Outpatient Prescriptions on File Prior to Encounter  Medication Sig Dispense Refill  . aspirin 81 MG EC tablet Take 81 mg by mouth daily.        Marland Kitchen atorvastatin (LIPITOR) 40 MG tablet Take 40 mg by mouth daily.      Marland Kitchen lisinopril (PRINIVIL,ZESTRIL) 20 MG tablet Take 20 mg by mouth daily with lunch.       . metoprolol tartrate (LOPRESSOR) 25 MG tablet Take 25 mg by mouth 2 (two) times daily.          REVIEW OF SYSTEMS: See history of present illness  PHYSICAL EXAMINATION: General: The patient appears their stated age.  Vital signs are BP 142/61  Pulse 84  Temp(Src) 98.1 F (36.7 C) (Oral)  Resp 15  Ht 6' 2.02" (1.88 m)  Wt 211 lb 10.3 oz (96 kg)  BMI 27.16 kg/m2  SpO2 96% HEENT:  No gross abnormalities Pulmonary: Respirations are non-labored Musculoskeletal: There are no major deformities.   Neurologic: No focal weakness or paresthesias are detected, Skin: There are no ulcer or rashes noted. Psychiatric: The patient has normal affect. Cardiovascular: There is  a regular rate and rhythm without significant murmur appreciated.  Nonpalpable pulse on the left foot.  The leg appears cold and slightly discolored.  He does have intact motor and sensory function.  Diagnostic Studies: Angiography revealed thrombosis of the left popliteal stent with distal embolization.   Assessment:  Occluded left popliteal stent Plan: I discussed options with the patient and his wife.  I feel that an intervention is warranted given the degree of ischemia to his foot.  His foot is currently viable and therefore I think this is urgent, not [employer] copy.  I discussed thrombolysis vs. surgical bypass.  Because of his distal embolization, I am in favor of proceeding with a trial from a lysis to see if this can help clean out some of his distal embolization.  The patient comes in for this procedure to be performed by interventional  radiology.     Jorge Ny, M.D. [facility] Office: [phone] Pager:  [phone]

## 2013-10-27 NOTE — Progress Notes (Signed)
Interventional Radiology Brief Progress Note  Called to pt bedside to evaluate left groin sheath - arterial pressure wave intermittent and sheath no longer aspirating.    Pt is clinically stable, no new left lower extremity or groin pain.  The left SFA pulse is 2+ palpable at the sheath entry.  Initial aspiration of sheath unsuccessful.  Sheath gently manipulated and immediate aspiration noted.  Sheath flushed.  I suspect the sheath tip was up against the vessel wall.  Pt no longer hemodynamically unstable and arterial pressure measurements not required.  Given normal function at this time, will leave sheath in place tonight and touch base with [facility] in the morning.  If sheath access no longer needed, will have sheath DC'd.   Signed,  Sterling Big, MD

## 2013-10-27 NOTE — Progress Notes (Signed)
Late Entry  Dr. Archer Asa called on 10/23/13 around 2000 and stated pt's fibrinogen level was 74. Order given to change TNK from 0.5 mg/hr to 0.25 mg/hr. Will change the dose and continue to monitor.  Alfonso Ellis, RN

## 2013-10-27 NOTE — Progress Notes (Signed)
Patient Name: [name] [name] Date of Encounter: 10/27/2013     Principal Problem:   Lower limb ischemia Active Problems:   Popliteal artery occlusion, left   Bradycardia    SUBJECTIVE  Denies any CP or SOB. Felt good last night. Extubated yesterday  CURRENT MEDS . sodium chloride   Intravenous Once  . sodium chloride   Intravenous Once  . antiseptic oral rinse  7 mL Mouth Rinse QID  . chlorhexidine  15 mL Mouth Rinse BID  . metoprolol tartrate  25 mg Oral BID  . nicotine  7 mg Transdermal Daily  . pantoprazole (PROTONIX) IV  40 mg Intravenous Q24H  . piperacillin-tazobactam (ZOSYN)  IV  3.375 g Intravenous 3 times per day  . potassium chloride  20-40 mEq Oral Once  . sodium chloride  2,000 mL Intravenous Once  . sodium chloride  3 mL Intravenous Q12H  . vancomycin  1,250 mg Intravenous Q12H    OBJECTIVE  Filed Vitals:   10/27/13 0759 10/27/13 0900 10/27/13 1000 10/27/13 1003  BP:  120/65 141/68 141/65  Pulse:  75 80 76  Temp: 97.5 F (36.4 C)     TempSrc: Oral     Resp:  21 19   Height:      Weight:      SpO2:  98% 98%     Intake/Output Summary (Last 24 hours) at 10/27/13 1018 Last data filed at 10/27/13 1000  Gross per 24 hour  Intake 1318.53 ml  Output   2945 ml  Net -1626.47 ml   Filed Weights   10/23/13 1305 10/26/13 0441  Weight: 207 lb 14.3 oz (94.3 kg) 211 lb 10.3 oz (96 kg)    PHYSICAL EXAM  General: Pleasant, NAD. Neuro: Alert and oriented X 3. Moves all extremities spontaneously. Psych: Normal affect. HEENT:  Normal  Neck: Supple without bruits or JVD. Lungs:  Resp regular and unlabored, CTA. Heart: RRR no s3, s4, or murmurs. Abdomen: Soft, non-tender, non-distended, BS + x 4.  Extremities: No clubbing, cyanosis or edema. DP/PT/Radials 2+ and equal bilaterally.  Accessory Clinical Findings  CBC  Recent Labs  10/26/13 0339 10/27/13 0420  WBC 12.2* 9.1  HGB 10.4* 10.3*  HCT 30.6* 29.3*  MCV 92.4 90.2  PLT 149* 139*    Basic Metabolic Panel  Recent Labs  10/25/13 1200 10/26/13 0339  NA 140 136*  K 4.5 3.7  CL 106 104  CO2 22 21  GLUCOSE 100* 121*  BUN 26* 26*  CREATININE 1.04 0.97  CALCIUM 8.5 8.4   Liver Function Tests  Recent Labs  10/24/13 2300  AST 19  ALT 17  ALKPHOS 58  BILITOT 1.6*  PROT 6.2  ALBUMIN 3.3*    Recent Labs  10/24/13 2300  LIPASE 23  AMYLASE 95   Cardiac Enzymes  Recent Labs  10/24/13 1408 10/24/13 2300  TROPONINI <0.30 <0.30   Hemoglobin A1C  Recent Labs  10/24/13 1200  HGBA1C 5.6   Fasting Lipid Panel No results found for this basename: CHOL, HDL, LDLCALC, TRIG, CHOLHDL, LDLDIRECT,  in the last 72 hours Thyroid Function Tests  Recent Labs  10/26/13 0945  TSH 4.920*    TELE NSR with HR 60-70s. No significant ventricular ectopy    ECG  No EKG today  Echocardiogram  LV EF: 55% - 60%  ------------------------------------------------------------------- History: PMH: Bradycardia, AAA, Former Smoker. Right Coronary Occlusion. Risk factors: Hypertension. Dyslipidemia.  ------------------------------------------------------------------- Study Conclusions  - Left ventricle: The cavity size was normal. Wall  thickness was increased in a pattern of moderate LVH. Systolic function was normal. The estimated ejection fraction was in the range of 55% to 60%. Wall motion was normal; there were no regional wall motion abnormalities. Doppler parameters are consistent with abnormal left ventricular relaxation (grade 1 diastolic dysfunction). The E/e&' ratio is between 8-15, suggesting indeterminate LV filling pressure. - Left atrium: The atrium was normal in size.  Impressions:  - LVEF 55-60%, normal wall motion, moderate LVH, normal LA size, diastolic dysfunction, indeterminate LV filling pressure.       Radiology/Studies  Ct Head Wo Contrast  10/24/2013   CLINICAL DATA:  Altered mental status  EXAM: CT HEAD WITHOUT CONTRAST   TECHNIQUE: Contiguous axial images were obtained from the base of the skull through the vertex without intravenous contrast.  COMPARISON:  None.  FINDINGS: Mild to moderate age-related cortical atrophy. No evidence of hemorrhage or extra-axial fluid. No evidence of infarct or mass. No hydrocephalus. Calvarium intact. No significant inflammatory change in the visualized portions of the paranasal sinuses.  IMPRESSION: No acute intracranial abnormalities.   Electronically Signed   By: Esperanza Heir M.D.   On: 10/24/2013 10:09   Ir Angiogram Extremity Left  10/23/2013   CLINICAL DATA:  Occluded left popliteal stented aneurysm, rest pain, peripheral vascular disease  EXAM: ULTRASOUND GUIDANCE FOR VASCULAR ACCESS  LEFT LOWER EXTREMITY ANGIOGRAM  SELECTIVE LEFT POSTERIOR TIBIAL PERIPHERAL ANGIOGRAM  INSERTION OF A 5 FRENCH INFUSION CATHETER ACROSS THE LEFT POPLITEAL OCCLUSION FOR INITIATION OF INTRA-ARTERIAL THROMBOLYSIS  Date:  9/11/20159/12/2013 11:56 am  Radiologist:  M. Ruel Favors, MD  Guidance:  Ultrasound fluoroscopic  FLUOROSCOPY TIME:  3 min 12 seconds  MEDICATIONS AND MEDICAL HISTORY: 1 mg Versed, 25 mcg fentanyl  ANESTHESIA/SEDATION: 30 min  CONTRAST:  20mL VISIPAQUE IODIXANOL 320 MG/ML IV SOLN  COMPLICATIONS: No immediate  PROCEDURE: Informed consent was obtained from the patient following explanation of the procedure, risks, benefits and alternatives. The patient understands, agrees and consents for the procedure. All questions were addressed. A time out was performed.  Maximal barrier sterile technique utilized including caps, mask, sterile gowns, sterile gloves, large sterile drape, hand hygiene, and Betadine.  Under sterile conditions and local anesthesia, ultrasound micropuncture access was performed of the left femoral artery in antegrade fashion. Guidewire inserted followed by 4 [place] dilator. Six French sheath inserted. Contrast injection performed for left lower extremity angiogram. Left common  femoral, profunda femoral, and superficial femoral arteries are patent proximally. Left distal SFA occlusion noted above the occluded popliteal stent.  Catheter and guidewire access were manipulated through the left distal SFA and popliteal occlusion. Catheter was advanced below the knee into the left posterior tibial artery. Injection of the left posterior tibial artery demonstrates intra-arterial position. Thrombus also present throughout the left posterior tibial artery and the adjacent small branches.  Guidewire inserted to advance a 90 cm Unifuse catheter across the occluded segment with a 40 cm infusion length. Images obtained for documentation. Trans arterial catheter thrombolysis will be initiated with TNK 0.5 milligrams/hour.  IMPRESSION: Left distal SFA and stented popliteal arterial ooclusion extending into the tibial vessels.  Successful catheter access across the occluded segment to begin intra-arterial transcatheter thrombolysis.   Electronically Signed   By: Ruel Favors M.D.   On: 10/23/2013 12:40   Ir Angiogram Selective Each Additional Vessel  10/23/2013   CLINICAL DATA:  Occluded left popliteal stented aneurysm, rest pain, peripheral vascular disease  EXAM: ULTRASOUND GUIDANCE FOR VASCULAR ACCESS  LEFT LOWER EXTREMITY ANGIOGRAM  SELECTIVE LEFT POSTERIOR TIBIAL PERIPHERAL ANGIOGRAM  INSERTION OF A 5 FRENCH INFUSION CATHETER ACROSS THE LEFT POPLITEAL OCCLUSION FOR INITIATION OF INTRA-ARTERIAL THROMBOLYSIS  Date:  9/11/20159/12/2013 11:56 am  Radiologist:  M. Ruel Favors, MD  Guidance:  Ultrasound fluoroscopic  FLUOROSCOPY TIME:  3 min 12 seconds  MEDICATIONS AND MEDICAL HISTORY: 1 mg Versed, 25 mcg fentanyl  ANESTHESIA/SEDATION: 30 min  CONTRAST:  20mL VISIPAQUE IODIXANOL 320 MG/ML IV SOLN  COMPLICATIONS: No immediate  PROCEDURE: Informed consent was obtained from the patient following explanation of the procedure, risks, benefits and alternatives. The patient understands, agrees and consents  for the procedure. All questions were addressed. A time out was performed.  Maximal barrier sterile technique utilized including caps, mask, sterile gowns, sterile gloves, large sterile drape, hand hygiene, and Betadine.  Under sterile conditions and local anesthesia, ultrasound micropuncture access was performed of the left femoral artery in antegrade fashion. Guidewire inserted followed by 4 [place] dilator. Six French sheath inserted. Contrast injection performed for left lower extremity angiogram. Left common femoral, profunda femoral, and superficial femoral arteries are patent proximally. Left distal SFA occlusion noted above the occluded popliteal stent.  Catheter and guidewire access were manipulated through the left distal SFA and popliteal occlusion. Catheter was advanced below the knee into the left posterior tibial artery. Injection of the left posterior tibial artery demonstrates intra-arterial position. Thrombus also present throughout the left posterior tibial artery and the adjacent small branches.  Guidewire inserted to advance a 90 cm Unifuse catheter across the occluded segment with a 40 cm infusion length. Images obtained for documentation. Trans arterial catheter thrombolysis will be initiated with TNK 0.5 milligrams/hour.  IMPRESSION: Left distal SFA and stented popliteal arterial ooclusion extending into the tibial vessels.  Successful catheter access across the occluded segment to begin intra-arterial transcatheter thrombolysis.   Electronically Signed   By: Ruel Favors M.D.   On: 10/23/2013 12:40   Ir US Guide Vasc Access Left  10/23/2013   CLINICAL DATA:  Occluded left popliteal stented aneurysm, rest pain, peripheral vascular disease  EXAM: ULTRASOUND GUIDANCE FOR VASCULAR ACCESS  LEFT LOWER EXTREMITY ANGIOGRAM  SELECTIVE LEFT POSTERIOR TIBIAL PERIPHERAL ANGIOGRAM  INSERTION OF A 5 FRENCH INFUSION CATHETER ACROSS THE LEFT POPLITEAL OCCLUSION FOR INITIATION OF INTRA-ARTERIAL  THROMBOLYSIS  Date:  9/11/20159/12/2013 11:56 am  Radiologist:  M. Ruel Favors, MD  Guidance:  Ultrasound fluoroscopic  FLUOROSCOPY TIME:  3 min 12 seconds  MEDICATIONS AND MEDICAL HISTORY: 1 mg Versed, 25 mcg fentanyl  ANESTHESIA/SEDATION: 30 min  CONTRAST:  20mL VISIPAQUE IODIXANOL 320 MG/ML IV SOLN  COMPLICATIONS: No immediate  PROCEDURE: Informed consent was obtained from the patient following explanation of the procedure, risks, benefits and alternatives. The patient understands, agrees and consents for the procedure. All questions were addressed. A time out was performed.  Maximal barrier sterile technique utilized including caps, mask, sterile gowns, sterile gloves, large sterile drape, hand hygiene, and Betadine.  Under sterile conditions and local anesthesia, ultrasound micropuncture access was performed of the left femoral artery in antegrade fashion. Guidewire inserted followed by 4 [place] dilator. Six French sheath inserted. Contrast injection performed for left lower extremity angiogram. Left common femoral, profunda femoral, and superficial femoral arteries are patent proximally. Left distal SFA occlusion noted above the occluded popliteal stent.  Catheter and guidewire access were manipulated through the left distal SFA and popliteal occlusion. Catheter was advanced below the knee into the left posterior tibial artery. Injection of the left posterior tibial artery demonstrates intra-arterial position. Thrombus  also present throughout the left posterior tibial artery and the adjacent small branches.  Guidewire inserted to advance a 90 cm Unifuse catheter across the occluded segment with a 40 cm infusion length. Images obtained for documentation. Trans arterial catheter thrombolysis will be initiated with TNK 0.5 milligrams/hour.  IMPRESSION: Left distal SFA and stented popliteal arterial ooclusion extending into the tibial vessels.  Successful catheter access across the occluded segment to begin  intra-arterial transcatheter thrombolysis.   Electronically Signed   By: Ruel Favors M.D.   On: 10/23/2013 12:40   Dg Chest Port 1 View  10/26/2013   CLINICAL DATA:  Followup respiratory failure  EXAM: PORTABLE CHEST - 1 VIEW  COMPARISON:  10/25/2013  FINDINGS: Cardiomediastinal silhouette is stable. Stable endotracheal and NG tube position. Worsening atelectasis or infiltrate in right middle lobe. Question small right pleural effusion. Left lung is clear. No pulmonary edema.  IMPRESSION: Worsening atelectasis or infiltrate in right middle lobe. Stable support apparatus. Question small right pleural effusion.   Electronically Signed   By: Natasha Mead M.D.   On: 10/26/2013 08:11   Dg Chest Port 1 View  10/25/2013   CLINICAL DATA:  Evaluate endotracheal tube placement.  EXAM: PORTABLE CHEST - 1 VIEW  COMPARISON:  Chest x-ray 10/24/2013.  FINDINGS: An endotracheal tube is in place with tip 7.5 cm above the carina. Transcutaneous defibrillator pads projecting over the mid and lower left hemithorax. Lung volumes are low, with probable partial atelectasis of the right middle lobe. No acute consolidative airspace disease. No pleural effusions. No evidence of pulmonary edema. Heart size and mediastinal contours are within normal limits.  IMPRESSION: 1. Support apparatus, as above. 2. Low lung volumes with partial atelectasis of the right middle lobe.   Electronically Signed   By: Trudie Reed M.D.   On: 10/25/2013 09:25   Dg Chest Port 1 View  10/24/2013   CLINICAL DATA:  Recheck endotracheal tube placement  EXAM: PORTABLE CHEST - 1 VIEW  COMPARISON:  10/24/2013 at 0722 hr  FINDINGS: Endotracheal tube unchanged with tip about 4.3 cm above the carina. Limited inspiratory effect. Lungs clear.  IMPRESSION: No change in position of endotracheal tube   Electronically Signed   By: Esperanza Heir M.D.   On: 10/24/2013 10:37   Dg Chest Port 1v Same Day  10/24/2013   CLINICAL DATA:  Hypotension.  EXAM: PORTABLE  CHEST - 1 VIEW SAME DAY  COMPARISON:  11/24/2009  FINDINGS: Endotracheal tube is in good position. Heart size and pulmonary vascularity are normal and the lungs are clear. No osseous abnormality. No pneumothorax.  IMPRESSION: Endotracheal tube in good position.  Lungs are clear.   Electronically Signed   By: Geanie Cooley M.D.   On: 10/24/2013 08:06   Dg Abd Portable 1v  10/25/2013   CLINICAL DATA:  Orogastric tube placement.  EXAM: PORTABLE ABDOMEN - 1 VIEW  COMPARISON:  12/29/2012.  FINDINGS: The distal end of an orogastric tube is noted with tip in the mid body of the stomach. Visualized bowel gas pattern is nonobstructive. And aorta bi-iliac stent graft is noted. Defibrillator pad projecting over the left side of the abdomen.  IMPRESSION: 1. Tip of orogastric tube is in the mid body of the stomach.   Electronically Signed   By: Trudie Reed M.D.   On: 10/25/2013 15:19   Ct Angio Abd/pel W/ And/or W/o  10/24/2013   CLINICAL DATA:  hemoharge in setting TPA use, possible enlarging AAA  EXAM: CT ANGIOGRAPHY ABDOMEN  AND PELVIS  TECHNIQUE: Multidetector CT imaging of the abdomen and pelvis was performed using the standard protocol during bolus administration of intravenous contrast. Multiplanar reconstructed images including MIPs were obtained and reviewed to evaluate the vascular anatomy.  CONTRAST:  80mL OMNIPAQUE IOHEXOL 350 MG/ML SOLN  COMPARISON:  11/21/2012 and earlier studies  FINDINGS: ARTERIAL FINDINGS:  Aorta: Visualized distal descending thoracic and suprarenal segments unremarkable. Patent bifurcated infrarenal aortic stent graft. No evidence of endoleak. Native aneurysm diameter 5.6 cm on image 133/601, previously 5.7 cm by my measurement.  Celiac axis:          Patent  Superior mesenteric:  Patent, with classic distal branch anatomy.  Left renal:           Single, patent.  Right renal:          Duplicated, inferior dominant, both patent.  Inferior mesenteric:  Origin occlusion.  Left iliac: Left  limb of the stent graft extends to the mid common iliac artery. Mild scattered plaque in the distal native common iliac artery and internal iliac artery. External iliac is widely patent. Common femoral artery is widely patent. There is a 9 mm pseudoaneurysm from the distal common femoral. Profunda femoris branches patent. Visualized proximal SFA patent. Antegrade arterial sheath is in the proximal SFA.  Right iliac: Right limb of the stent graft extends to the distal common iliac artery. Minimal plaque in the native internal iliac artery which is mildly ectatic -8 mm diameter. External iliac and common femoral artery patent. Visualized portions of proximal profunda femoris and SFA patent.  Venous findings:      Venous phase not obtained.  Review of the MIP images confirms the above findings.  Nonvascular findings: Minimal dependent atelectasis posteriorly in the visualized lung bases. Partially calcified subcentimeter stone in the dependent aspect of the nondilated gallbladder. 16 mm cyst in hepatic segment 3. Unremarkable spleen, adrenal glands, pancreas. Stable hepatic cysts. No hydronephrosis. Stomach, small bowel, and colon are nondilated.Normal appendix. A few scattered sigmoid diverticula without adjacent inflammatory/edematous change. Urinary bladder is partially decompressed by Foley catheter. Marked prostatic enlargement with central coarse calcifications. Minimal spondylitic changes in the visualized lower thoracic and lumbar spine.  There is no retroperitoneal or pelvic hematoma. There is a large amount of probable blood throughout the subcutaneous tissues of the anterior right thigh.  IMPRESSION: 1. Patent infrarenal bifurcated aortic stent graft without endoleak. Slight decrease in native aneurysm sac diameter, 5.6 cm. 2. 9mm pseudoaneurysm from left common femoral artery with large anterior thigh hematoma. 3. No evidence of retroperitoneal or pelvic hematoma. Critical Value/emergent results were called  by telephone at the time of interpretation on 10/24/2013 at 11:02 am to Dr. Imogene Burn, who verbally acknowledged these results.  4. Cholelithiasis 5. Marked prostatic enlargement.   Electronically Signed   By: Oley Balm M.D.   On: 10/24/2013 11:02   Ir Infusion Thrombol Arterial Initial (ms)  10/23/2013   CLINICAL DATA:  Occluded left popliteal stented aneurysm, rest pain, peripheral vascular disease  EXAM: ULTRASOUND GUIDANCE FOR VASCULAR ACCESS  LEFT LOWER EXTREMITY ANGIOGRAM  SELECTIVE LEFT POSTERIOR TIBIAL PERIPHERAL ANGIOGRAM  INSERTION OF A 5 FRENCH INFUSION CATHETER ACROSS THE LEFT POPLITEAL OCCLUSION FOR INITIATION OF INTRA-ARTERIAL THROMBOLYSIS  Date:  9/11/20159/12/2013 11:56 am  Radiologist:  M. Ruel Favors, MD  Guidance:  Ultrasound fluoroscopic  FLUOROSCOPY TIME:  3 min 12 seconds  MEDICATIONS AND MEDICAL HISTORY: 1 mg Versed, 25 mcg fentanyl  ANESTHESIA/SEDATION: 30 min  CONTRAST:  20mL VISIPAQUE IODIXANOL 320  MG/ML IV SOLN  COMPLICATIONS: No immediate  PROCEDURE: Informed consent was obtained from the patient following explanation of the procedure, risks, benefits and alternatives. The patient understands, agrees and consents for the procedure. All questions were addressed. A time out was performed.  Maximal barrier sterile technique utilized including caps, mask, sterile gowns, sterile gloves, large sterile drape, hand hygiene, and Betadine.  Under sterile conditions and local anesthesia, ultrasound micropuncture access was performed of the left femoral artery in antegrade fashion. Guidewire inserted followed by 4 [place] dilator. Six French sheath inserted. Contrast injection performed for left lower extremity angiogram. Left common femoral, profunda femoral, and superficial femoral arteries are patent proximally. Left distal SFA occlusion noted above the occluded popliteal stent.  Catheter and guidewire access were manipulated through the left distal SFA and popliteal occlusion. Catheter was  advanced below the knee into the left posterior tibial artery. Injection of the left posterior tibial artery demonstrates intra-arterial position. Thrombus also present throughout the left posterior tibial artery and the adjacent small branches.  Guidewire inserted to advance a 90 cm Unifuse catheter across the occluded segment with a 40 cm infusion length. Images obtained for documentation. Trans arterial catheter thrombolysis will be initiated with TNK 0.5 milligrams/hour.  IMPRESSION: Left distal SFA and stented popliteal arterial ooclusion extending into the tibial vessels.  Successful catheter access across the occluded segment to begin intra-arterial transcatheter thrombolysis.   Electronically Signed   By: Ruel Favors M.D.   On: 10/23/2013 12:40   Ir Rande Lawman F/u Eval Art/ven Final Day (ms)  10/24/2013   CLINICAL DATA:  Left Popliteal aneurysm, post stent graft placement in 2011. Stent occlusion. Now post overnight catheter directed thrombolytic infusion. The infusion was terminated at approximately 6:45 a.m. because of a large left thigh hematoma.  EXAM: LEFT LOWER EXTREMITY ARTERIOGRAM THROUGH EXISTING CATHETER  TECHNIQUE: Contrast injected through the previously placed antegrade left femoral sheaths for left lower extremity arteriography. The infusion catheter and wire were removed. The sheath was left in place at the patient returned to the ICU.  FINDINGS: SFA is patent with mild atheromatous irregularity, no stenosis. Long segment popliteal artery occlusion from above the stent graft. Small geniculate collaterals cross the knee, reconstituting only a short segment of a proximal calf tibial vessel. No named arteries are identified below the midcalf level.  IMPRESSION: 1. Persistent occlusion of the popliteal artery without significant collateral reconstitution distally. Critical Value/emergent results were called by telephone at the time of interpretation on 10/24/2013 to Dr. Imogene Burn who verbally  acknowledged these results.   Electronically Signed   By: Oley Balm M.D.   On: 10/24/2013 11:09    ASSESSMENT AND PLAN  68 yo male w/ hx BMS RCA 2007, AAA & L pop endovasc repair 2011, HTN, HLD, admitted 09/11 for L SFA 100%, rx w/ TNK/heparin, developed hematoma and shock. Intubated. Still on pressors. Cards following for bradycardia, HR 20s during acute event, now resolved with HR 70s. Patient extubated on the night of 10/26/2013  1. Bradycardia  - TSH mildly elevated, will check free T4  - likely vasovagal, no consistent bradycardia, no EP at this time  - restarted BB yesterday, VSS, call if needed, will sign off   2. LE ischemia 2/2 L popliteal artery occlusioon  - s/p catheter directed thrombolysis, however had hematoma and bleeding, 9/12 persistent occlusion of popliteal artery w/o significant collateral reconstituation distally  - per Vascular, possible revasculization once patient stablize  3. Shock - resolved  Signed, Azalee Course PA-C  Pager: [phone]  Personally seen and examined. Agree with above. Discussed with family. Tolerating metoprolol 25 BID. Continue.  Will sign off, bradycardia resolved. Likely vagal response. Agree with checking Free T4 (likely sick euthyroid).  ECHO - EF normal, 55%.    Donato Schultz, MD

## 2013-10-27 NOTE — Plan of Care (Signed)
Problem: Phase I Progression Outcomes Goal: Distal pulses equal to baseline Outcome: Not Met (add Reason) Continues to have cool left foot. Unable to obtain posterior tib and dorsalis pedispulses by doppler in left leg. Able to obtain popliteal pulse by doppler. Goal: Pain controlled with appropriate interventions Outcome: Progressing States he does not have pain

## 2013-10-27 NOTE — Progress Notes (Signed)
Late Entry  CRITICAL VALUE ALERT  Critical value received: Fibrinogen < 60  Date of notification: 10/24/13  Time of notification: 0300  Critical value read back: yes  Nurse who received alert: Alfonso Ellis, RN (from Ernie Hew, RN)  MD notified (1st page): Dr. Archer Asa  Time of first page: 0315  MD notified (2nd page): Dr. Archer Asa  Time of second page: 0330  Didn't receive a call back.

## 2013-10-27 NOTE — Progress Notes (Signed)
1705-Left femoral sheath has become very positional. It dampens and waveform becomes flat. It will flush but am unable to draw back. No visible kinks, pressure bag is inflated and line has been flushed at least every 2 hours. Dr. Archer Asa with IR notified. Dressing is intact. Able to obtain left popliteal pulse with doppler. Foot remains cool and unable to obtain posterior tib or dorsalis pedis pulses with doppler all day. DrMarland Kitchen Myra Gianotti is aware as well as Dr. Archer Asa.  Order obtained to discontinue every 6 hours fibrinogen levels.

## 2013-10-27 NOTE — Progress Notes (Signed)
ANTIBIOTIC CONSULT NOTE - Follow-up  Pharmacy Consult for vancomycin and Zosyn Indication: sepsis - unknown source  Allergies  Allergen Reactions  . Omnipaque [Iohexol] Rash    Pt requires full premeds and needs to bring a driver.  Broke out in rash on back and arms.  Ok w/ premeds  . Oxytetracycline Rash    Terramycin causes rash    Patient Measurements: Height: 6' 2.02" (188 cm) Weight: 211 lb 10.3 oz (96 kg) IBW/kg (Calculated) : 82.24 Adjusted Body Weight: 87kg  Vital Signs: Temp: 97.5 F (36.4 C) (09/15 0759) Temp src: Oral (09/15 0759) BP: 141/65 mmHg (09/15 1003) Pulse Rate: 76 (09/15 1003) Intake/Output from previous day: 09/14 0701 - 09/15 0700 In: 1492.5 [I.V.:522.5; NG/GT:320; IV Piggyback:650] Out: 2685 [Urine:2685] Intake/Output from this shift: Total I/O In: 60 [I.V.:60] Out: 480 [Urine:480]  Labs:  Recent Labs  10/25/13 1200 10/26/13 0339 10/27/13 0420  WBC 15.2* 12.2* 9.1  HGB 11.8* 10.4* 10.3*  PLT 178 149* 139*  CREATININE 1.04 0.97  --    Estimated Creatinine Clearance: 82.4 ml/min (by C-G formula based on Cr of 0.97).  Recent Labs  10/27/13 0950  VANCOTROUGH 10.3     Microbiology: Recent Results (from the past 720 hour(s))  MRSA PCR SCREENING     Status: None   Collection Time    10/23/13  4:50 PM      Result Value Ref Range Status   MRSA by PCR NEGATIVE  NEGATIVE Final   Comment:            The GeneXpert MRSA Assay (FDA     approved for NASAL specimens     only), is one component of a     comprehensive MRSA colonization     surveillance program. It is not     intended to diagnose MRSA     infection nor to guide or     monitor treatment for     MRSA infections.  CULTURE, BLOOD (ROUTINE X 2)     Status: None   Collection Time    10/24/13 10:57 PM      Result Value Ref Range Status   Specimen Description BLOOD LEFT WRIST   Final   Special Requests BOTTLES DRAWN AEROBIC AND ANAEROBIC 10CC EACH   Final   Culture  Setup Time      Final   Value: 10/25/2013 12:52     Performed at [facility]   Culture     Final   Value:        BLOOD CULTURE RECEIVED NO GROWTH TO DATE CULTURE WILL BE HELD FOR 5 DAYS BEFORE ISSUING A FINAL NEGATIVE REPORT     Performed at [facility]   Report Status PENDING   Incomplete  CULTURE, BLOOD (ROUTINE X 2)     Status: None   Collection Time    10/24/13 11:00 PM      Result Value Ref Range Status   Specimen Description BLOOD LEFT HAND   Final   Special Requests BOTTLES DRAWN AEROBIC AND ANAEROBIC 10CC EACH   Final   Culture  Setup Time     Final   Value: 10/25/2013 12:52     Performed at [facility]   Culture     Final   Value:        BLOOD CULTURE RECEIVED NO GROWTH TO DATE CULTURE WILL BE HELD FOR 5 DAYS BEFORE ISSUING A FINAL NEGATIVE REPORT     Note: Culture results may  be compromised due to an excessive volume of blood received in culture bottles.     Performed at [facility]   Report Status PENDING   Incomplete  CULTURE, RESPIRATORY (NON-EXPECTORATED)     Status: None   Collection Time    10/25/13  9:34 AM      Result Value Ref Range Status   Specimen Description TRACHEAL ASPIRATE   Final   Special Requests NONE   Final   Gram Stain PENDING   Incomplete   Culture     Final   Value: Non-Pathogenic Oropharyngeal-type Flora Isolated.     Performed at [facility]   Report Status PENDING   Incomplete    Assessment: 20 YOM with ischemic left foot s/p thrombolytic catheter placement. Pt continues on Vancomycin and Zosyn Day#3 for r/o sepsis in the setting of hypotension requiring pressors despite adequate Hgb with recent bleed. Unknown source. PCT 0.15. Afeb. WBC down to nl. Cultures ngtd.  Vancomycin trough 10.3 mcg/ml (subtherapeutic) on  IV q12h  Goal of Therapy:  Vancomycin trough level 15-20 mcg/ml  Plan:  - Change Vancomycin to  IV q12h - Zosyn 3.375g IV q8h (each dose over 4 hours) - F/u micro data, clinical  progression, renal function, and trough at new Css if continues  Christoper Fabian, PharmD, BCPS Clinical pharmacist, pager [phone] 10/27/2013 11:28 AM

## 2013-10-28 DIAGNOSIS — Z01810 Encounter for preprocedural cardiovascular examination: Secondary | ICD-10-CM

## 2013-10-28 LAB — TYPE AND SCREEN
ABO/RH(D): O NEG
Antibody Screen: NEGATIVE
UNIT DIVISION: 0
UNIT DIVISION: 0
Unit division: 0
Unit division: 0

## 2013-10-28 LAB — CBC
HEMATOCRIT: 30.4 % — AB (ref 39.0–52.0)
HEMOGLOBIN: 10.6 g/dL — AB (ref 13.0–17.0)
MCH: 31.2 pg (ref 26.0–34.0)
MCHC: 34.9 g/dL (ref 30.0–36.0)
MCV: 89.4 fL (ref 78.0–100.0)
Platelets: 202 10*3/uL (ref 150–400)
RBC: 3.4 MIL/uL — ABNORMAL LOW (ref 4.22–5.81)
RDW: 12.7 % (ref 11.5–15.5)
WBC: 8.6 10*3/uL (ref 4.0–10.5)

## 2013-10-28 LAB — CULTURE, RESPIRATORY

## 2013-10-28 LAB — CLOSTRIDIUM DIFFICILE BY PCR: Toxigenic C. Difficile by PCR: NEGATIVE

## 2013-10-28 LAB — CULTURE, RESPIRATORY W GRAM STAIN

## 2013-10-28 MED ORDER — CEFAZOLIN SODIUM 1-5 GM-% IV SOLN
1.0000 g | INTRAVENOUS | Status: DC
  Filled 2013-10-28: qty 50

## 2013-10-28 MED ORDER — PANTOPRAZOLE SODIUM 40 MG PO TBEC
40.0000 mg | DELAYED_RELEASE_TABLET | Freq: Every day | ORAL | Status: DC
  Administered 2013-10-28: 40 mg via ORAL
  Filled 2013-10-28: qty 1

## 2013-10-28 MED ORDER — CEFAZOLIN SODIUM-DEXTROSE 2-3 GM-% IV SOLR
2.0000 g | INTRAVENOUS | Status: AC
  Administered 2013-10-29: 2 g via INTRAVENOUS
  Filled 2013-10-28: qty 50

## 2013-10-28 NOTE — Plan of Care (Signed)
Problem: Phase II Progression Outcomes Goal: Barriers To Progression Addressed/Resolved Outcome: Progressing Left femoral arterial sheath removed by IR today. Currently on bedrest. For left femoral to below knee popliteal bypass in am.  Goal: Discharge plan in place and appropriate Outcome: Completed/Met Date Met:  10/28/13 Will be going home with wife. Goal: Vascular site scale level 0 - I Vascular Site Scale Level 0: No bruising/bleeding/hematoma Level I (Mild): Bruising/Ecchymosis, minimal bleeding/ooozing, palpable hematoma < 3 cm Level II (Moderate): Bleeding not affecting hemodynamic parameters, pseudoaneurysm, palpable hematoma > 3 cm Level III (Severe) Bleeding which affects hemodynamic parameters or retroperitoneal hemorrhage  Outcome: Completed/Met Date Met:  10/28/13 Level 1

## 2013-10-28 NOTE — Progress Notes (Signed)
VASCULAR LAB PRELIMINARY  PRELIMINARY  PRELIMINARY  PRELIMINARY  Bilateral Lower Extremity Vein Map    Right Great Saphenous Vein   Segment Diameter Comment  1. Origin 5.16 mm   2. High Thigh 3.94 mm   3. Mid Thigh 3.61 mm   4. Low Thigh 3.61 mm   5. At Knee 3.75 mm   6. High Calf 3.20 mm Follow anterior branch      7. Low Calf 3.43 mm   8. Ankle 3.07 mm    mm    mm    mm      Left Great Saphenous Vein   Segment Diameter Comment  1. Origin 5.81 mm   2. High Thigh 5.34 mm   3. Mid Thigh 4.95 mm   4. Low Thigh 4.99 mm   5. At Knee 3.01 mm   6. High Calf 2.33 mm   7. Low Calf 2.97 mm   8. Ankle 3.56 mm    mm    mm    mm       Dayron Odland, RVT 10/28/2013, 7:40 PM

## 2013-10-28 NOTE — Progress Notes (Addendum)
    Subjective  -  No complaints Heat pack on foot is helping   Physical Exam:  CV:  RRR, non-palpable left pedal Pulm:  CTA Neuro:  Normal motor and sensory function to left leg       Assessment/Plan:    Vein mapping today with anticipation of left fem BK pop BPG tomorrow.  Discussed details of procedure with patient Left femoral sheath to be d/c'd this am NPO after midnight Leave foley in place with plans for surgery tomorrow and need for bedrest after sheath pull Needs pre-meds before OR case for IV dye allergy  BRABHAM IV, V. WELLS 10/28/2013 8:01 AM --  Filed Vitals:   10/28/13 0744  BP:   Pulse:   Temp: 98.2 F (36.8 C)  Resp:     Intake/Output Summary (Last 24 hours) at 10/28/13 0801 Last data filed at 10/28/13 0700  Gross per 24 hour  Intake   1765 ml  Output   2190 ml  Net   -425 ml     Laboratory CBC    Component Value Date/Time   WBC 8.6 10/28/2013 0410   HGB 10.6* 10/28/2013 0410   HCT 30.4* 10/28/2013 0410   PLT 202 10/28/2013 0410    BMET    Component Value Date/Time   NA 136* 10/26/2013 0339   K 3.7 10/26/2013 0339   CL 104 10/26/2013 0339   CO2 21 10/26/2013 0339   GLUCOSE 121* 10/26/2013 0339   BUN 26* 10/26/2013 0339   CREATININE 0.97 10/26/2013 0339   CALCIUM 8.4 10/26/2013 0339   GFRNONAA 82* 10/26/2013 0339   GFRAA >90 10/26/2013 0339    COAG Lab Results  Component Value Date   INR 1.48 10/24/2013   INR 1.28 10/23/2013   INR 1.04 10/23/2013   No results found for this basename: PTT    Antibiotics Anti-infectives   Start     Dose/Rate Route Frequency Ordered Stop   10/27/13 2200  vancomycin (VANCOCIN) 1,750 mg in sodium chloride 0.9 % 500 mL IVPB     1,750 mg 250 mL/hr over 120 Minutes Intravenous Every 12 hours 10/27/13 1141     10/25/13 2200  vancomycin (VANCOCIN) 1,250 mg in sodium chloride 0.9 % 250 mL IVPB  Status:  Discontinued     1,250 mg 166.7 mL/hr over 90 Minutes Intravenous Every 12 hours 10/25/13 0958 10/27/13  1141   10/25/13 1400  piperacillin-tazobactam (ZOSYN) IVPB 3.375 g     3.375 g 12.5 mL/hr over 240 Minutes Intravenous 3 times per day 10/25/13 0958     10/25/13 1000  vancomycin (VANCOCIN) 1,750 mg in sodium chloride 0.9 % 500 mL IVPB     1,750 mg 250 mL/hr over 120 Minutes Intravenous  Once 10/25/13 0958 10/25/13 1433   10/25/13 1000  piperacillin-tazobactam (ZOSYN) IVPB 3.375 g  Status:  Discontinued     3.375 g 100 mL/hr over 30 Minutes Intravenous  Once 10/25/13 0958 10/25/13 1235       V. Charlena Cross, M.D. [facility] Office: [phone] Pager:  [phone]

## 2013-10-28 NOTE — Progress Notes (Signed)
Interventional Radiology here at bedside to remove 6Fr left groin sheath.  Mr Gornick identified by name and DOB.  Hematoma and bruise noted prior to sheath pull. Sheath pulled at 1000 pressure held for 20 mins. RN Isaiah Serge here at bedside.  Tegaderm and pressure dressing applied

## 2013-10-28 NOTE — Plan of Care (Signed)
Problem: Consults Goal: Skin Care Protocol Initiated - if Braden Score 18 or less If consults are not indicated, leave blank or document N/A Outcome: Progressing Sacral pressure dressing protocol in place.

## 2013-10-29 ENCOUNTER — Encounter (HOSPITAL_COMMUNITY): Payer: Medicare Other | Admitting: Certified Registered Nurse Anesthetist

## 2013-10-29 ENCOUNTER — Encounter (HOSPITAL_COMMUNITY)
Admission: AD | Disposition: A | Discharge status: Home Health | Payer: Self-pay | Source: Ambulatory Visit | Attending: Surgery

## 2013-10-29 ENCOUNTER — Encounter (HOSPITAL_COMMUNITY): Payer: Self-pay

## 2013-10-29 ENCOUNTER — Inpatient Hospital Stay (HOSPITAL_COMMUNITY): Payer: Medicare Other | Admitting: Certified Registered Nurse Anesthetist

## 2013-10-29 DIAGNOSIS — T82898A Other specified complication of vascular prosthetic devices, implants and grafts, initial encounter: Secondary | ICD-10-CM

## 2013-10-29 DIAGNOSIS — I739 Peripheral vascular disease, unspecified: Secondary | ICD-10-CM | POA: Diagnosis present

## 2013-10-29 HISTORY — PX: FEMORAL-POPLITEAL BYPASS GRAFT: SHX937

## 2013-10-29 LAB — BASIC METABOLIC PANEL
Anion gap: 14 (ref 5–15)
BUN: 19 mg/dL (ref 6–23)
CALCIUM: 8.5 mg/dL (ref 8.4–10.5)
CO2: 21 meq/L (ref 19–32)
CREATININE: 1 mg/dL (ref 0.50–1.35)
Chloride: 105 mEq/L (ref 96–112)
GFR calc Af Amer: 86 mL/min — ABNORMAL LOW (ref >90)
GFR calc non Af Amer: 74 mL/min — ABNORMAL LOW (ref >90)
GLUCOSE: 107 mg/dL — AB (ref 70–99)
Potassium: 3.5 mEq/L — ABNORMAL LOW (ref 3.7–5.3)
SODIUM: 140 meq/L (ref 137–147)

## 2013-10-29 LAB — PREPARE RBC (CROSSMATCH)

## 2013-10-29 LAB — PROTIME-INR
INR: 1.15 (ref 0.00–1.49)
Prothrombin Time: 14.7 seconds (ref 11.6–15.2)

## 2013-10-29 LAB — CBC
HCT: 31 % — ABNORMAL LOW (ref 39.0–52.0)
Hemoglobin: 10.7 g/dL — ABNORMAL LOW (ref 13.0–17.0)
MCH: 30.9 pg (ref 26.0–34.0)
MCHC: 34.5 g/dL (ref 30.0–36.0)
MCV: 89.6 fL (ref 78.0–100.0)
PLATELETS: 228 10*3/uL (ref 150–400)
RBC: 3.46 MIL/uL — ABNORMAL LOW (ref 4.22–5.81)
RDW: 12.6 % (ref 11.5–15.5)
WBC: 8.4 10*3/uL (ref 4.0–10.5)

## 2013-10-29 LAB — POCT I-STAT 4, (NA,K, GLUC, HGB,HCT)
Glucose, Bld: 113 mg/dL — ABNORMAL HIGH (ref 70–99)
HCT: 28 % — ABNORMAL LOW (ref 39.0–52.0)
Hemoglobin: 9.5 g/dL — ABNORMAL LOW (ref 13.0–17.0)
Potassium: 4.2 mEq/L (ref 3.7–5.3)
Sodium: 141 mEq/L (ref 137–147)

## 2013-10-29 SURGERY — BYPASS GRAFT FEMORAL-POPLITEAL ARTERY
Anesthesia: General | Site: Leg Upper | Laterality: Left

## 2013-10-29 MED ORDER — FENTANYL CITRATE 0.05 MG/ML IJ SOLN
INTRAMUSCULAR | Status: AC
  Filled 2013-10-29: qty 5

## 2013-10-29 MED ORDER — DEXTROSE 5 % IV SOLN
1.5000 g | Freq: Two times a day (BID) | INTRAVENOUS | Status: AC
  Administered 2013-10-30 (×2): 1.5 g via INTRAVENOUS
  Filled 2013-10-29 (×2): qty 1.5

## 2013-10-29 MED ORDER — DOPAMINE-DEXTROSE 3.2-5 MG/ML-% IV SOLN: 3.0000 ug/kg/min | INTRAVENOUS | Status: DC

## 2013-10-29 MED ORDER — ROCURONIUM BROMIDE 100 MG/10ML IV SOLN
INTRAVENOUS | Status: DC | PRN
  Administered 2013-10-29: 50 mg via INTRAVENOUS
  Administered 2013-10-29: 10 mg via INTRAVENOUS

## 2013-10-29 MED ORDER — DOCUSATE SODIUM 100 MG PO CAPS
100.0000 mg | ORAL_CAPSULE | Freq: Every day | ORAL | Status: DC
  Administered 2013-10-31 – 2013-11-08 (×3): 100 mg via ORAL
  Filled 2013-10-29 (×18): qty 1

## 2013-10-29 MED ORDER — POTASSIUM CHLORIDE CRYS ER 20 MEQ PO TBCR
20.0000 meq | EXTENDED_RELEASE_TABLET | Freq: Every day | ORAL | Status: AC | PRN
  Administered 2013-11-05: 20 meq via ORAL

## 2013-10-29 MED ORDER — ONDANSETRON HCL 4 MG/2ML IJ SOLN
INTRAMUSCULAR | Status: DC | PRN
  Administered 2013-10-29: 4 mg via INTRAVENOUS

## 2013-10-29 MED ORDER — CEFAZOLIN SODIUM-DEXTROSE 2-3 GM-% IV SOLR
INTRAVENOUS | Status: DC | PRN
  Administered 2013-10-29 (×2): 2 g via INTRAVENOUS

## 2013-10-29 MED ORDER — DEXMEDETOMIDINE HCL 200 MCG/2ML IV SOLN
INTRAVENOUS | Status: DC | PRN
  Administered 2013-10-29: 4 ug via INTRAVENOUS
  Administered 2013-10-29: 8 ug via INTRAVENOUS

## 2013-10-29 MED ORDER — GUAIFENESIN-DM 100-10 MG/5ML PO SYRP: 15.0000 mL | ORAL_SOLUTION | ORAL | Status: DC | PRN

## 2013-10-29 MED ORDER — SODIUM CHLORIDE 0.9 % IV SOLN
INTRAVENOUS | Status: DC | PRN
  Administered 2013-10-29: 19:00:00 via INTRAVENOUS

## 2013-10-29 MED ORDER — HEPARIN SODIUM (PORCINE) 1000 UNIT/ML IJ SOLN
INTRAMUSCULAR | Status: DC | PRN
  Administered 2013-10-29: 2000 [IU] via INTRAVENOUS
  Administered 2013-10-29: 9000 [IU] via INTRAVENOUS

## 2013-10-29 MED ORDER — ROCURONIUM BROMIDE 50 MG/5ML IV SOLN
INTRAVENOUS | Status: AC
  Filled 2013-10-29: qty 2

## 2013-10-29 MED ORDER — PROPOFOL 10 MG/ML IV BOLUS
INTRAVENOUS | Status: AC
  Filled 2013-10-29: qty 20

## 2013-10-29 MED ORDER — ARTIFICIAL TEARS OP OINT
TOPICAL_OINTMENT | OPHTHALMIC | Status: AC
  Filled 2013-10-29: qty 3.5

## 2013-10-29 MED ORDER — LACTATED RINGERS IV SOLN
INTRAVENOUS | Status: DC | PRN
  Administered 2013-10-29 (×3): via INTRAVENOUS

## 2013-10-29 MED ORDER — PHENYLEPHRINE HCL 10 MG/ML IJ SOLN
10.0000 mg | INTRAVENOUS | Status: DC | PRN
  Administered 2013-10-29: 25 ug/min via INTRAVENOUS

## 2013-10-29 MED ORDER — PAPAVERINE HCL 30 MG/ML IJ SOLN
INTRAMUSCULAR | Status: AC
  Filled 2013-10-29: qty 2

## 2013-10-29 MED ORDER — MIDAZOLAM HCL 5 MG/5ML IJ SOLN
INTRAMUSCULAR | Status: DC | PRN
Start: 2013-10-29 — End: 2013-10-29
  Administered 2013-10-29: 1 mg via INTRAVENOUS

## 2013-10-29 MED ORDER — ALUM & MAG HYDROXIDE-SIMETH 200-200-20 MG/5ML PO SUSP: 15.0000 mL | ORAL | Status: DC | PRN

## 2013-10-29 MED ORDER — LABETALOL HCL 5 MG/ML IV SOLN
10.0000 mg | INTRAVENOUS | Status: DC | PRN
  Filled 2013-10-29: qty 4

## 2013-10-29 MED ORDER — HEMOSTATIC AGENTS (NO CHARGE) OPTIME
TOPICAL | Status: DC | PRN
  Administered 2013-10-29: 1 via TOPICAL

## 2013-10-29 MED ORDER — DEXAMETHASONE SODIUM PHOSPHATE 10 MG/ML IJ SOLN
INTRAMUSCULAR | Status: DC | PRN
  Administered 2013-10-29: 10 mg via INTRAVENOUS

## 2013-10-29 MED ORDER — PROTAMINE SULFATE 10 MG/ML IV SOLN
INTRAVENOUS | Status: DC | PRN
  Administered 2013-10-29: 25 mg via INTRAVENOUS

## 2013-10-29 MED ORDER — ALBUMIN HUMAN 5 % IV SOLN
INTRAVENOUS | Status: DC | PRN
  Administered 2013-10-29: 14:00:00 via INTRAVENOUS

## 2013-10-29 MED ORDER — MIDAZOLAM HCL 2 MG/2ML IJ SOLN
INTRAMUSCULAR | Status: AC
  Filled 2013-10-29: qty 2

## 2013-10-29 MED ORDER — ONDANSETRON HCL 4 MG/2ML IJ SOLN
INTRAMUSCULAR | Status: AC
  Filled 2013-10-29: qty 2

## 2013-10-29 MED ORDER — SODIUM CHLORIDE 0.9 % IR SOLN
Status: DC | PRN
  Administered 2013-10-29: 13:00:00

## 2013-10-29 MED ORDER — HYDROMORPHONE HCL 1 MG/ML IJ SOLN: 0.2500 mg | INTRAMUSCULAR | Status: DC | PRN

## 2013-10-29 MED ORDER — OXYCODONE HCL 5 MG/5ML PO SOLN: 5.0000 mg | Freq: Once | ORAL | Status: DC | PRN

## 2013-10-29 MED ORDER — LACTATED RINGERS IV SOLN
INTRAVENOUS | Status: DC
  Administered 2013-10-29: 12:00:00 via INTRAVENOUS

## 2013-10-29 MED ORDER — LIDOCAINE HCL (CARDIAC) 20 MG/ML IV SOLN
INTRAVENOUS | Status: DC | PRN
  Administered 2013-10-29: 100 mg via INTRAVENOUS

## 2013-10-29 MED ORDER — SODIUM CHLORIDE 0.9 % IV SOLN
INTRAVENOUS | Status: DC
  Administered 2013-10-29 – 2013-11-03 (×6): via INTRAVENOUS

## 2013-10-29 MED ORDER — PHENYLEPHRINE HCL 10 MG/ML IJ SOLN
INTRAMUSCULAR | Status: DC | PRN
  Administered 2013-10-29 (×6): 80 ug via INTRAVENOUS

## 2013-10-29 MED ORDER — DEXMEDETOMIDINE HCL IN NACL 200 MCG/50ML IV SOLN
INTRAVENOUS | Status: AC
  Filled 2013-10-29: qty 50

## 2013-10-29 MED ORDER — OXYCODONE HCL 5 MG PO TABS
5.0000 mg | ORAL_TABLET | ORAL | Status: DC | PRN
  Administered 2013-11-14: 5 mg via ORAL
  Filled 2013-10-29: qty 1

## 2013-10-29 MED ORDER — PROPOFOL 10 MG/ML IV BOLUS
INTRAVENOUS | Status: DC | PRN
  Administered 2013-10-29: 120 mg via INTRAVENOUS
  Administered 2013-10-29: 80 mg via INTRAVENOUS

## 2013-10-29 MED ORDER — HYDRALAZINE HCL 20 MG/ML IJ SOLN: 10.0000 mg | INTRAMUSCULAR | Status: DC | PRN

## 2013-10-29 MED ORDER — FENTANYL CITRATE 0.05 MG/ML IJ SOLN
INTRAMUSCULAR | Status: DC | PRN
  Administered 2013-10-29 (×3): 50 ug via INTRAVENOUS
  Administered 2013-10-29: 100 ug via INTRAVENOUS
  Administered 2013-10-29 (×5): 50 ug via INTRAVENOUS
  Administered 2013-10-29: 100 ug via INTRAVENOUS
  Administered 2013-10-29 (×3): 50 ug via INTRAVENOUS

## 2013-10-29 MED ORDER — LIDOCAINE HCL (CARDIAC) 20 MG/ML IV SOLN
INTRAVENOUS | Status: AC
  Filled 2013-10-29: qty 5

## 2013-10-29 MED ORDER — PHENOL 1.4 % MT LIQD: 1.0000 | OROMUCOSAL | Status: DC | PRN

## 2013-10-29 MED ORDER — MORPHINE SULFATE 2 MG/ML IJ SOLN: 2.0000 mg | INTRAMUSCULAR | Status: DC | PRN

## 2013-10-29 MED ORDER — LACTATED RINGERS IV SOLN
INTRAVENOUS | Status: DC | PRN
  Administered 2013-10-29 (×2): via INTRAVENOUS

## 2013-10-29 MED ORDER — ARTIFICIAL TEARS OP OINT
TOPICAL_OINTMENT | OPHTHALMIC | Status: DC | PRN
  Administered 2013-10-29: 1 via OPHTHALMIC

## 2013-10-29 MED ORDER — ONDANSETRON HCL 4 MG/2ML IJ SOLN: 4.0000 mg | Freq: Four times a day (QID) | INTRAMUSCULAR | Status: DC | PRN

## 2013-10-29 MED ORDER — METOPROLOL TARTRATE 1 MG/ML IV SOLN: 2.0000 mg | INTRAVENOUS | Status: DC | PRN

## 2013-10-29 MED ORDER — OXYCODONE HCL 5 MG PO TABS: 5.0000 mg | ORAL_TABLET | Freq: Once | ORAL | Status: DC | PRN

## 2013-10-29 MED ORDER — SODIUM CHLORIDE 0.9 % IV SOLN: 500.0000 mL | Freq: Once | INTRAVENOUS | Status: AC | PRN

## 2013-10-29 MED ORDER — PROMETHAZINE HCL 25 MG/ML IJ SOLN: 6.2500 mg | INTRAMUSCULAR | Status: DC | PRN

## 2013-10-29 MED ORDER — 0.9 % SODIUM CHLORIDE (POUR BTL) OPTIME
TOPICAL | Status: DC | PRN
  Administered 2013-10-29 (×2): 1000 mL

## 2013-10-29 MED ORDER — PANTOPRAZOLE SODIUM 40 MG PO TBEC
40.0000 mg | DELAYED_RELEASE_TABLET | Freq: Every day | ORAL | Status: DC
  Administered 2013-10-31 – 2013-11-15 (×14): 40 mg via ORAL
  Filled 2013-10-29 (×14): qty 1

## 2013-10-29 SURGICAL SUPPLY — 74 items
BANDAGE ELASTIC 4 VELCRO ST LF (GAUZE/BANDAGES/DRESSINGS) IMPLANT
BANDAGE ESMARK 6X9 LF (GAUZE/BANDAGES/DRESSINGS) IMPLANT
BNDG ESMARK 6X9 LF (GAUZE/BANDAGES/DRESSINGS)
CANISTER SUCTION 2500CC (MISCELLANEOUS) ×3 IMPLANT
CATH EMB 3FR 40CM (CATHETERS) ×3 IMPLANT
CATH EMB 3FR 80CM (CATHETERS) ×3 IMPLANT
CATH EMB 4FR 80CM (CATHETERS) ×3 IMPLANT
CLIP TI MEDIUM 24 (CLIP) ×3 IMPLANT
CLIP TI WIDE RED SMALL 24 (CLIP) ×6 IMPLANT
COVER PROBE W GEL 5X96 (DRAPES) ×3 IMPLANT
COVER TRANSDUCER ULTRASND GEL (DRAPE) ×3 IMPLANT
CUFF TOURNIQUET SINGLE 24IN (TOURNIQUET CUFF) IMPLANT
CUFF TOURNIQUET SINGLE 34IN LL (TOURNIQUET CUFF) IMPLANT
CUFF TOURNIQUET SINGLE 44IN (TOURNIQUET CUFF) IMPLANT
DERMABOND ADHESIVE PROPEN (GAUZE/BANDAGES/DRESSINGS) ×2
DERMABOND ADVANCED (GAUZE/BANDAGES/DRESSINGS) ×8
DERMABOND ADVANCED .7 DNX12 (GAUZE/BANDAGES/DRESSINGS) ×4 IMPLANT
DERMABOND ADVANCED .7 DNX6 (GAUZE/BANDAGES/DRESSINGS) ×1 IMPLANT
DRAIN CHANNEL 15F RND FF W/TCR (WOUND CARE) ×3 IMPLANT
DRAPE X-RAY CASS 24X20 (DRAPES) IMPLANT
DRSG COVADERM 4X10 (GAUZE/BANDAGES/DRESSINGS) IMPLANT
DRSG COVADERM 4X8 (GAUZE/BANDAGES/DRESSINGS) IMPLANT
ELECT REM PT RETURN 9FT ADLT (ELECTROSURGICAL) ×3
ELECTRODE REM PT RTRN 9FT ADLT (ELECTROSURGICAL) ×1 IMPLANT
EVACUATOR SILICONE 100CC (DRAIN) ×3 IMPLANT
GAUZE SPONGE 4X4 16PLY XRAY LF (GAUZE/BANDAGES/DRESSINGS) ×3 IMPLANT
GLOVE BIO SURGEON STRL SZ 6.5 (GLOVE) ×4 IMPLANT
GLOVE BIO SURGEONS STRL SZ 6.5 (GLOVE) ×2
GLOVE BIOGEL PI IND STRL 6.5 (GLOVE) ×2 IMPLANT
GLOVE BIOGEL PI IND STRL 7.5 (GLOVE) ×1 IMPLANT
GLOVE BIOGEL PI IND STRL 8 (GLOVE) ×1 IMPLANT
GLOVE BIOGEL PI INDICATOR 6.5 (GLOVE) ×4
GLOVE BIOGEL PI INDICATOR 7.5 (GLOVE) ×2
GLOVE BIOGEL PI INDICATOR 8 (GLOVE) ×2
GLOVE ECLIPSE 6.5 STRL STRAW (GLOVE) ×3 IMPLANT
GLOVE SKINSENSE NS SZ7.0 (GLOVE) ×2
GLOVE SKINSENSE STRL SZ7.0 (GLOVE) ×1 IMPLANT
GLOVE SURG SS PI 7.5 STRL IVOR (GLOVE) ×3 IMPLANT
GOWN STRL REUS W/ TWL LRG LVL3 (GOWN DISPOSABLE) ×3 IMPLANT
GOWN STRL REUS W/ TWL XL LVL3 (GOWN DISPOSABLE) ×2 IMPLANT
GOWN STRL REUS W/TWL LRG LVL3 (GOWN DISPOSABLE) ×6
GOWN STRL REUS W/TWL XL LVL3 (GOWN DISPOSABLE) ×4
HEMOSTAT SNOW SURGICEL 2X4 (HEMOSTASIS) IMPLANT
KIT BASIN OR (CUSTOM PROCEDURE TRAY) ×3 IMPLANT
KIT ROOM TURNOVER OR (KITS) ×3 IMPLANT
MARKER GRAFT CORONARY BYPASS (MISCELLANEOUS) IMPLANT
NS IRRIG 1000ML POUR BTL (IV SOLUTION) ×6 IMPLANT
PACK PERIPHERAL VASCULAR (CUSTOM PROCEDURE TRAY) ×3 IMPLANT
PAD ARMBOARD 7.5X6 YLW CONV (MISCELLANEOUS) ×6 IMPLANT
PADDING CAST COTTON 6X4 STRL (CAST SUPPLIES) IMPLANT
SET COLLECT BLD 21X3/4 12 (NEEDLE) IMPLANT
SPONGE GAUZE 4X4 12PLY STER LF (GAUZE/BANDAGES/DRESSINGS) ×3 IMPLANT
STOPCOCK 4 WAY LG BORE MALE ST (IV SETS) IMPLANT
SUT ETHILON 3 0 PS 1 (SUTURE) IMPLANT
SUT PROLENE 5 0 C 1 24 (SUTURE) ×21 IMPLANT
SUT PROLENE 5 0 C 1 36 (SUTURE) ×3 IMPLANT
SUT PROLENE 6 0 BV (SUTURE) ×9 IMPLANT
SUT PROLENE 7 0 BV 1 (SUTURE) ×18 IMPLANT
SUT SILK 2 0 SH (SUTURE) ×6 IMPLANT
SUT SILK 3 0 (SUTURE) ×4
SUT SILK 3-0 18XBRD TIE 12 (SUTURE) ×2 IMPLANT
SUT VIC AB 2-0 CT1 27 (SUTURE) ×6
SUT VIC AB 2-0 CT1 TAPERPNT 27 (SUTURE) ×3 IMPLANT
SUT VIC AB 3-0 SH 27 (SUTURE) ×10
SUT VIC AB 3-0 SH 27X BRD (SUTURE) ×5 IMPLANT
SUT VICRYL 4-0 PS2 18IN ABS (SUTURE) ×6 IMPLANT
SYR 3ML LL SCALE MARK (SYRINGE) ×3 IMPLANT
SYRINGE 1CC SLIP TB (MISCELLANEOUS) ×3 IMPLANT
TAPE CLOTH SURG 4X10 WHT LF (GAUZE/BANDAGES/DRESSINGS) ×3 IMPLANT
TOWEL OR 17X24 6PK STRL BLUE (TOWEL DISPOSABLE) ×6 IMPLANT
TRAY FOLEY CATH 16FRSI W/METER (SET/KITS/TRAYS/PACK) IMPLANT
TUBING EXTENTION W/L.L. (IV SETS) IMPLANT
UNDERPAD 30X30 INCONTINENT (UNDERPADS AND DIAPERS) ×3 IMPLANT
WATER STERILE IRR 1000ML POUR (IV SOLUTION) ×3 IMPLANT

## 2013-10-29 NOTE — Anesthesia Postprocedure Evaluation (Signed)
  Anesthesia Post-op Note  Patient: DEMONTREZ RINDFLEISCH  Procedure(s) Performed: Procedure(s) with comments: BYPASS GRAFT FEMORAL-POPLITEAL ARTERY (Left) - Left Femoral to Posterior Tibial artery bypass graft using nonreversed saphenous vein. and thrombectomy of Tibial arterly.  Patient Location: PACU  Anesthesia Type:General  Level of Consciousness: awake, alert  and oriented  Airway and Oxygen Therapy: Patient Spontanous Breathing and Patient connected to nasal cannula oxygen  Post-op Pain: mild  Post-op Assessment: Post-op Vital signs reviewed, Patient's Cardiovascular Status Stable, Respiratory Function Stable, Patent Airway and Pain level controlled  Post-op Vital Signs: stable  Last Vitals:  Filed Vitals:   10/29/13 2230  BP: 127/68  Pulse: 77  Temp:   Resp: 19    Complications: No apparent anesthesia complications

## 2013-10-29 NOTE — Progress Notes (Signed)
NUTRITION FOLLOW UP  INTERVENTION: Advance diet as medically appropriate, add interventions accordingly RD to follow for nutrition care plan  NUTRITION DIAGNOSIS: Inadequate oral intake related to inability to eat as evidenced by NPO, ongoing  Goal: Pt to meet >/= 90% of their estimated nutrition needs, progressing   Monitor:  PO diet advancement & intake, weight, labs, I/O's  ASSESSMENT: 40 M with ischemic left foot 1 day status post placement of thrombolytic catheter in L femoral artery. AM of 9/12 developed bradycardia in conjunction with sudden appearance of hematomas of L should and L groin. PCCM consulted for emergent management.   Patient extubated 9/14.  Patient's diet advanced to Full Liquids, Heart Healthy 9/16.  Currently NPO for left femoral to below knee popliteal bypass.  PO intake at 50-100% per flowsheet records prior to NPO status.  RD to monitor diet re-advancement, add supplements as needed.  Height: Ht Readings from Last 1 Encounters:  10/23/13 6' 2.02" (1.88 m)    Weight: Wt Readings from Last 1 Encounters:  10/26/13 211 lb 10.3 oz (96 kg)    BMI:  Body mass index is 27.16 kg/(m^2).  Re-estimated Nutritional Needs: Kcal: 2100-2300 Protein: 115-130 gm Fluid: per MD  Skin: puncture on left groin  Diet Order: NPO   Intake/Output Summary (Last 24 hours) at 10/29/13 1131 Last data filed at 10/29/13 1000  Gross per 24 hour  Intake   1895 ml  Output   1846 ml  Net     49 ml   Labs:   Recent Labs Lab 10/25/13 1200 10/26/13 0339 10/29/13 0238  NA 140 136* 140  K 4.5 3.7 3.5*  CL 106 104 105  CO2 BUN 26* 26* 19  CREATININE 1.04 0.97 1.00  CALCIUM 8.5 8.4 8.5  GLUCOSE 100* 121* 107*    CBG (last 3)   Recent Labs  10/26/13 1202 10/26/13 1520  GLUCAP 109* 122*    Scheduled Meds: . sodium chloride   Intravenous Once  . sodium chloride   Intravenous Once  . antiseptic oral rinse  7 mL Mouth Rinse BID  . metoprolol  tartrate  25 mg Oral BID  . nicotine  7 mg Transdermal Daily  . pantoprazole  40 mg Oral Daily  . piperacillin-tazobactam (ZOSYN)  IV  3.375 g Intravenous 3 times per day  . potassium chloride  20-40 mEq Oral Once  . sodium chloride  2,000 mL Intravenous Once  . sodium chloride  3 mL Intravenous Q12H  . vancomycin  1,750 mg Intravenous Q12H    Continuous Infusions: . phenylephrine (NEO-SYNEPHRINE) Adult infusion Stopped (10/26/13 1629)    Past Medical History  Diagnosis Date  . Abdominal aortic aneurysm   . Popliteal aneurysm   . Hyperlipidemia   . Hypertension   . Coronary artery disease   . Detached retina   . Myocardial infarction     2007 heart stent  . Chronic kidney disease     Right kidney stone    Past Surgical History  Procedure Laterality Date  . Coronary stenting      2007  . Abdominal aortic aneurysm repair  11/24/09    EVAR  . Popliteal artery stent  11/2009    left popliteal artery   . Retinal detachment surgery Right 2011  . Tooth extraction  June 2015    Wisdom Tooth Extraction    Maureen Chatters, RD, LDN Pager #: [phone] After-Hours Pager #: [phone]

## 2013-10-29 NOTE — Progress Notes (Signed)
Subjective  -   No new issues   Physical Exam:  Left foot continues to be perfused with motor and sensory function intact, although it is cool to the touch. Significant ecchymosis in the left groin and left shoulder       Assessment/Plan:    I have reviewed his vein mapping.  He appears to have an adequate greater saphenous vein on the left.  Plan today will be for a left femoral to below knee popliteal artery bypass graft with vein.  The procedure was discussed in detail with the patient  Kyaire Gruenewald IV, V. WELLS 10/29/2013 1:22 PM --  Filed Vitals:   10/29/13 1200  BP: 125/70  Pulse: 72  Temp:   Resp: 20    Intake/Output Summary (Last 24 hours) at 10/29/13 1322 Last data filed at 10/29/13 1100  Gross per 24 hour  Intake   1650 ml  Output   1866 ml  Net   -216 ml     Laboratory CBC    Component Value Date/Time   WBC 8.4 10/29/2013 0238   HGB 10.7* 10/29/2013 0238   HCT 31.0* 10/29/2013 0238   PLT 228 10/29/2013 0238    BMET    Component Value Date/Time   NA 140 10/29/2013 0238   K 3.5* 10/29/2013 0238   CL 105 10/29/2013 0238   CO2 21 10/29/2013 0238   GLUCOSE 107* 10/29/2013 0238   BUN 19 10/29/2013 0238   CREATININE 1.00 10/29/2013 0238   CALCIUM 8.5 10/29/2013 0238   GFRNONAA 74* 10/29/2013 0238   GFRAA 86* 10/29/2013 0238    COAG Lab Results  Component Value Date   INR 1.15 10/29/2013   INR 1.48 10/24/2013   INR 1.28 10/23/2013   No results found for this basename: PTT    Antibiotics Anti-infectives   Start     Dose/Rate Route Frequency Ordered Stop   10/29/13 0000  ceFAZolin (ANCEF) IVPB 1 g/50 mL premix  Status:  Discontinued    Comments:  Send with pt to OR   1 g 100 mL/hr over 30 Minutes Intravenous On call 10/28/13 0805 10/28/13 1442   10/29/13 0000  ceFAZolin (ANCEF) IVPB 2 g/50 mL premix    Comments:  Send with pt to OR   2 g 100 mL/hr over 30 Minutes Intravenous On call 10/28/13 1442 10/29/13 0125   10/27/13 2200  [MAR Hold]   vancomycin (VANCOCIN) 1,750 mg in sodium chloride 0.9 % 500 mL IVPB     (On MAR Hold since 10/29/13 1210)   1,750 mg 250 mL/hr over 120 Minutes Intravenous Every 12 hours 10/27/13 1141     10/25/13 2200  vancomycin (VANCOCIN) 1,250 mg in sodium chloride 0.9 % 250 mL IVPB  Status:  Discontinued     1,250 mg 166.7 mL/hr over 90 Minutes Intravenous Every 12 hours 10/25/13 0958 10/27/13 1141   10/25/13 1400  [MAR Hold]  piperacillin-tazobactam (ZOSYN) IVPB 3.375 g     (On MAR Hold since 10/29/13 1210)   3.375 g 12.5 mL/hr over 240 Minutes Intravenous 3 times per day 10/25/13 0958     10/25/13 1000  vancomycin (VANCOCIN) 1,750 mg in sodium chloride 0.9 % 500 mL IVPB     1,750 mg 250 mL/hr over 120 Minutes Intravenous  Once 10/25/13 0958 10/25/13 1433   10/25/13 1000  piperacillin-tazobactam (ZOSYN) IVPB 3.375 g  Status:  Discontinued     3.375 g 100 mL/hr over 30 Minutes Intravenous  Once 10/25/13 0958 10/25/13  1235       V. Charlena Cross, M.D. [facility] [place] Office: [phone] Pager:  [phone]

## 2013-10-29 NOTE — Progress Notes (Signed)
Report to short stay RN

## 2013-10-29 NOTE — OR Nursing (Signed)
Dr. Myra Gianotti to bedside to assess pt.  Order received to keep pt in 2S bed instead of going to 3S.

## 2013-10-29 NOTE — Anesthesia Procedure Notes (Signed)
Procedure Name: Intubation Date/Time: 10/29/2013 1:49 PM Performed by: Fransisca Kaufmann Pre-anesthesia Checklist: Patient identified, Emergency Drugs available, Suction available, Patient being monitored and Timeout performed Patient Re-evaluated:Patient Re-evaluated prior to inductionOxygen Delivery Method: Circle system utilized Preoxygenation: Pre-oxygenation with 100% oxygen Intubation Type: IV induction Ventilation: Mask ventilation without difficulty Laryngoscope Size: Miller and 3 Grade View: Grade II Tube type: Oral Tube size: 8.0 mm Number of attempts: 1 Airway Equipment and Method: Stylet Placement Confirmation: ETT inserted through vocal cords under direct vision,  positive ETCO2 and breath sounds checked- equal and bilateral Secured at: 23 cm Tube secured with: Tape Dental Injury: Teeth and Oropharynx as per pre-operative assessment

## 2013-10-29 NOTE — Transfer of Care (Signed)
Immediate Anesthesia Transfer of Care Note  Patient: [name] [name]  Procedure(s) Performed: Procedure(s) with comments: BYPASS GRAFT FEMORAL-POPLITEAL ARTERY (Left) - Left Femoral to Posterior Tibial artery bypass graft using nonreversed saphenous vein. and thrombectomy of Tibial arterly.  Patient Location: PACU  Anesthesia Type:General  Level of Consciousness: awake, oriented and patient cooperative  Airway & Oxygen Therapy: Patient Spontanous Breathing and Patient connected to nasal cannula oxygen  Post-op Assessment: Report given to PACU RN, Post -op Vital signs reviewed and stable and Patient moving all extremities X 4  Post vital signs: Reviewed and stable  Complications: No apparent anesthesia complications

## 2013-10-29 NOTE — Op Note (Signed)
Patient name: [name] [name] MRN: [id number] DOB: 1945/07/08 Sex: male  10/23/2013 - 10/29/2013 Pre-operative Diagnosis: Occluded left femoral stent Post-operative diagnosis:  Same Surgeon:  Jorge Ny Assistants:  Doreatha Massed Procedure:   1: Left femoral to posterior tibial bypass graft with ipsilateral non-reversed translocated greater    saphenous vein   #2: Thrombectomy of left posterior tibial artery   #3: Ligation of left below knee popliteal artery Anesthesia:  Gen. Blood Loss:  See anesthesia record Specimens:  None  Findings:  Excellent caliber vein.  After reviewing angiograms 2011, there appears to be a stenosis within the proximal left posterior tibial artery which I felt was the likely reason that the stent occluded.  I therefore elected to perform his bypass graft beyond the stenosis within the posterior tibial artery.  Indications:  The patient is previously undergone stenting to treat a left popliteal aneurysm in 2011.  He presented to the hospital one week ago with a 6 day history of pain and coolness to his leg.  Angiography revealed an occluded stent.  He was brought back the following day for thrombolysis.  The patient developed a hematoma in his groin as well as the shoulder.  He had hypotension and bradycardia which necessitated terminating thrombolysis.  He had preservation of motor and sensory function in his left foot.  He has stabilized from his acute event and is now scheduled for surgical revascularization.  I discussed this with the patient.  All his questions were answered.  Procedure:  The patient was identified in the holding area and taken to [facility] OR ROOM 12  The patient was then placed supine on the table. general anesthesia was administered.  The patient was prepped and draped in the usual sterile fashion.  A time out was called and antibiotics were administered.  A vertical incision was made in the left groin.  There was significant hematoma in  the left groin.  Sharp and cautery dissection were used to dissect out the femoral artery.  The artery was ectatic without significant calcification.  I dissected out the common femoral artery from the anal ligament down to the bifurcation.  The superficial femoral and profunda femoral artery were individually dissected free and encircled with vessel loops.  Next, I identified the saphenofemoral junction.  The saphenous vein was dissected free.  It was then harvested throughout the leg through skip incisions.  Side branches were ligated between silk ties and metal clips.  I then made a medial below the knee incision to continue the vein harvest.  Once I had fully harvested the vein it was reflected superiorly so that I could continue with dissection of the popliteal artery.  The subcutaneous tissue from the below knee medial incision was dissected free down to the fascia which was then opened with cautery.  The popliteal space was entered.  The soleal attachments to the tibia were taken down with cautery.  I dissected out the popliteal vein as well as the tibial nerve.  I then exposed the below knee popliteal artery.  I ligated the anterior tibial vein so as to expose the anterior tibial artery.  I then dissected out the tibioperoneal trunk, peroneal artery, and posterior tibial artery.  There was a significant plaque at the origin of the posterior tibial artery.  This did correlate with the angiographic stenosis obtained several years ago.  I felt that the best course of action at this time is to proceed with bypass to the posterior  tibial artery.  The vein was then ligated distally with a silk tie.  I then transected the vein at the saphenofemoral junction after placing a Cooley J. clamp.  The saphenofemoral junction was then oversewn with 5-0 Prolene in 2 layers.  The vein was then prepared back table.  It distended very nicely to about 4-5 mm.  It was then marked for orientation.  I then created a tunnel from  the medial below knee incision to the groin incision and a subsartorial plane.  The patient was then fully heparinized.  After the heparin circulated the common femoral, profundofemoral, and superficial femoral artery were occluded.  A #11 blade was used to make an arteriotomy which was extended longitudinally with Potts scissors.  The vein was then placed in a non-reversed fashion and spatulated to fit the size of the arteriotomy.  A running anastomosis was created with 5-0 Prolene.  Prior to completion, the appropriate flushing maneuvers were performed and the anastomosis was completed.  All clamps were then released.  Next I used multiple valvulotomes to lyse the valves within the vein.  There was excellent pulsatile flow through the vein after using the valvulotome.  The vein was then sutured to the tunneler and then brought through the previously created tunnel, making sure to maintain proper orientation and to take out any laxity within the vein.  I then made an arteriotomy in the posterior tibial artery which was extended longitudinally with Potts scissors.  I selected a #3 Fogarty catheter.  I was able to advance this into the popliteal artery and evacuate the thrombus.  This was then occluded and I then passed the Fogarty catheter distally.  It went for approximately 40 mm which correlated to the dorsum of the foot.  About 4 passes of the catheter were performed, evacuating thrombus up to the last pass.  There was good backbleeding after this was done.  The leg was then placed into extension and the vein graft cut to the appropriate length.  I then beveled the vein graft to fit the size of the arteriotomy and a running anastomosis was created with 6-0 Prolene.  Prior to completion, all flushing maneuvers were performed and the anastomosis was completed.  I then evaluated the posterior tibial artery with Doppler.  There was an excellent signal out onto the foot which was graft dependent.  I then elected to  ligate the popliteal artery as I was concerned that I may have passed the Fogarty catheter to for proximal and could have disrupted some thrombus within the stent which had already embolized but it occluded initially.  I ligated the below knee popliteal artery with a 3-0 silk tie.  Next, 25 mg of protamine was administered.  The vein harvest incisions were then closed with 2 layers of 3-0 Vicryl.  I inspected the below knee incision it was hemostatic.  I elected not to close the fascia to close the subcutaneous tissue with 2-0 Vicryl and the skin with 4-0 Vicryl.  The groin was also found to be hemostatic.  It was irrigated.  The femoral sheath was reapproximated with 2-0 Vicryl the subcutaneous tissue closed with multiple layers of 3-0 Vicryl, followed by 4-0 Vicryl and the skin.  I did place a 38 [place] Blake drain within the vein harvest tract which was brought out through a separate skin incision and sutured into position.  Dermabond was applied to all incisions.  The patient was successfully awakened from anesthesia and taken to the recovery room  in stable condition.  There were no immediate complications.   Disposition:  To PACU in stable condition.   Juleen China, M.D. [facility] [place] Office: [phone] Pager:  [phone]

## 2013-10-29 NOTE — Anesthesia Preprocedure Evaluation (Signed)
Anesthesia Evaluation  Patient identified by MRN, date of birth, ID band Patient awake    Reviewed: Allergy & Precautions, H&P , NPO status , Patient's Chart, lab work & pertinent test results  Airway Mallampati: II TM Distance: >3 FB Neck ROM: Full    Dental  (+) Teeth Intact, Dental Advisory Given   Pulmonary former smoker,    Pulmonary exam normal       Cardiovascular hypertension, + CAD, + Past MI and + Peripheral Vascular Disease     Neuro/Psych negative neurological ROS     GI/Hepatic negative GI ROS, Neg liver ROS,   Endo/Other    Renal/GU Renal InsufficiencyRenal disease     Musculoskeletal   Abdominal   Peds  Hematology   Anesthesia Other Findings   Reproductive/Obstetrics                           Anesthesia Physical Anesthesia Plan  ASA: III and emergent  Anesthesia Plan: General   Post-op Pain Management:    Induction: Intravenous  Airway Management Planned: Oral ETT  Additional Equipment:   Intra-op Plan:   Post-operative Plan: Extubation in OR  Informed Consent: I have reviewed the patients History and Physical, chart, labs and discussed the procedure including the risks, benefits and alternatives for the proposed anesthesia with the patient or authorized representative who has indicated his/her understanding and acceptance.   Dental advisory given  Plan Discussed with: CRNA, Anesthesiologist and Surgeon  Anesthesia Plan Comments:         Anesthesia Quick Evaluation

## 2013-10-30 ENCOUNTER — Inpatient Hospital Stay (HOSPITAL_COMMUNITY): Payer: Medicare Other | Admitting: Certified Registered Nurse Anesthetist

## 2013-10-30 ENCOUNTER — Encounter (HOSPITAL_COMMUNITY)
Admission: AD | Disposition: A | Discharge status: Home Health | Payer: Self-pay | Source: Ambulatory Visit | Attending: Surgery

## 2013-10-30 ENCOUNTER — Encounter (HOSPITAL_COMMUNITY): Payer: Medicare Other | Admitting: Certified Registered Nurse Anesthetist

## 2013-10-30 ENCOUNTER — Inpatient Hospital Stay (HOSPITAL_COMMUNITY): Payer: Medicare Other

## 2013-10-30 ENCOUNTER — Encounter (HOSPITAL_COMMUNITY): Payer: Self-pay

## 2013-10-30 HISTORY — PX: FEMORAL-TIBIAL BYPASS GRAFT: SHX938

## 2013-10-30 LAB — BASIC METABOLIC PANEL
ANION GAP: 11 (ref 5–15)
BUN: 16 mg/dL (ref 6–23)
CHLORIDE: 107 meq/L (ref 96–112)
CO2: 24 meq/L (ref 19–32)
CREATININE: 0.87 mg/dL (ref 0.50–1.35)
Calcium: 8.7 mg/dL (ref 8.4–10.5)
GFR calc Af Amer: >90 mL/min (ref >90)
GFR calc non Af Amer: 85 mL/min — ABNORMAL LOW (ref >90)
Glucose, Bld: 110 mg/dL — ABNORMAL HIGH (ref 70–99)
Potassium: 4.2 mEq/L (ref 3.7–5.3)
Sodium: 142 mEq/L (ref 137–147)

## 2013-10-30 LAB — TYPE AND SCREEN
ABO/RH(D): O NEG
Antibody Screen: NEGATIVE
UNIT DIVISION: 0
UNIT DIVISION: 0

## 2013-10-30 LAB — CBC
HEMATOCRIT: 31.8 % — AB (ref 39.0–52.0)
HEMATOCRIT: 26.9 % — AB (ref 39.0–52.0)
HEMOGLOBIN: 9.4 g/dL — AB (ref 13.0–17.0)
Hemoglobin: 11.1 g/dL — ABNORMAL LOW (ref 13.0–17.0)
MCH: 31 pg (ref 26.0–34.0)
MCH: 31.2 pg (ref 26.0–34.0)
MCHC: 34.9 g/dL (ref 30.0–36.0)
MCHC: 34.9 g/dL (ref 30.0–36.0)
MCV: 88.8 fL (ref 78.0–100.0)
MCV: 89.4 fL (ref 78.0–100.0)
PLATELETS: 237 10*3/uL (ref 150–400)
Platelets: 220 10*3/uL (ref 150–400)
RBC: 3.58 MIL/uL — AB (ref 4.22–5.81)
RBC: 3.01 MIL/uL — ABNORMAL LOW (ref 4.22–5.81)
RDW: 13.7 % (ref 11.5–15.5)
RDW: 13.8 % (ref 11.5–15.5)
WBC: 11.4 10*3/uL — AB (ref 4.0–10.5)
WBC: 9.6 10*3/uL (ref 4.0–10.5)

## 2013-10-30 SURGERY — CREATION, BYPASS, ARTERIAL, FEMORAL TO TIBIAL, USING GRAFT
Anesthesia: General | Site: Leg Lower | Laterality: Left

## 2013-10-30 MED ORDER — FENTANYL CITRATE 0.05 MG/ML IJ SOLN
INTRAMUSCULAR | Status: AC
  Filled 2013-10-30: qty 5

## 2013-10-30 MED ORDER — GLYCOPYRROLATE 0.2 MG/ML IJ SOLN
INTRAMUSCULAR | Status: AC
  Filled 2013-10-30: qty 3

## 2013-10-30 MED ORDER — ONDANSETRON HCL 4 MG/2ML IJ SOLN
INTRAMUSCULAR | Status: DC | PRN
  Administered 2013-10-30: 4 mg via INTRAVENOUS

## 2013-10-30 MED ORDER — ARTIFICIAL TEARS OP OINT
TOPICAL_OINTMENT | OPHTHALMIC | Status: AC
Start: 2013-10-30 — End: 2013-10-30
  Filled 2013-10-30: qty 3.5

## 2013-10-30 MED ORDER — BISACODYL 10 MG RE SUPP: 10.0000 mg | Freq: Every day | RECTAL | Status: DC | PRN

## 2013-10-30 MED ORDER — ARTIFICIAL TEARS OP OINT
TOPICAL_OINTMENT | OPHTHALMIC | Status: DC | PRN
Start: 2013-10-30 — End: 2013-10-30
  Administered 2013-10-30: 1 via OPHTHALMIC

## 2013-10-30 MED ORDER — LIDOCAINE HCL (CARDIAC) 20 MG/ML IV SOLN
INTRAVENOUS | Status: AC
  Filled 2013-10-30: qty 5

## 2013-10-30 MED ORDER — PHENYLEPHRINE HCL 10 MG/ML IJ SOLN
INTRAMUSCULAR | Status: DC | PRN
  Administered 2013-10-30 (×2): 40 ug via INTRAVENOUS
  Administered 2013-10-30: 80 ug via INTRAVENOUS
  Administered 2013-10-30: 40 ug via INTRAVENOUS

## 2013-10-30 MED ORDER — NEOSTIGMINE METHYLSULFATE 10 MG/10ML IV SOLN
INTRAVENOUS | Status: AC
  Filled 2013-10-30: qty 1

## 2013-10-30 MED ORDER — CEFAZOLIN SODIUM-DEXTROSE 2-3 GM-% IV SOLR
2.0000 g | INTRAVENOUS | Status: DC
  Filled 2013-10-30: qty 50

## 2013-10-30 MED ORDER — HEPARIN (PORCINE) IN NACL 100-0.45 UNIT/ML-% IJ SOLN
800.0000 [IU]/h | INTRAMUSCULAR | Status: DC
  Administered 2013-10-30: 400 [IU]/h via INTRAVENOUS
  Administered 2013-11-01 – 2013-11-03 (×2): 800 [IU]/h via INTRAVENOUS
  Filled 2013-10-30 (×5): qty 250

## 2013-10-30 MED ORDER — DEXAMETHASONE SODIUM PHOSPHATE 4 MG/ML IJ SOLN
INTRAMUSCULAR | Status: DC | PRN
  Administered 2013-10-30: 10 mg via INTRAVENOUS

## 2013-10-30 MED ORDER — SUCCINYLCHOLINE CHLORIDE 20 MG/ML IJ SOLN
INTRAMUSCULAR | Status: AC
  Filled 2013-10-30: qty 1

## 2013-10-30 MED ORDER — METHYLPREDNISOLONE SODIUM SUCC 125 MG IJ SOLR
INTRAMUSCULAR | Status: AC
  Filled 2013-10-30: qty 2

## 2013-10-30 MED ORDER — IOHEXOL 300 MG/ML  SOLN
INTRAMUSCULAR | Status: DC | PRN
  Administered 2013-10-30 (×2): 50 mL via INTRA_ARTERIAL

## 2013-10-30 MED ORDER — PROTAMINE SULFATE 10 MG/ML IV SOLN
INTRAVENOUS | Status: DC | PRN
  Administered 2013-10-30: 10 mg via INTRAVENOUS
  Administered 2013-10-30: 5 mg via INTRAVENOUS
  Administered 2013-10-30: 10 mg via INTRAVENOUS

## 2013-10-30 MED ORDER — PROTAMINE SULFATE 10 MG/ML IV SOLN
INTRAVENOUS | Status: AC
  Filled 2013-10-30: qty 5

## 2013-10-30 MED ORDER — FENTANYL CITRATE 0.05 MG/ML IJ SOLN
INTRAMUSCULAR | Status: DC | PRN
  Administered 2013-10-30: 100 ug via INTRAVENOUS
  Administered 2013-10-30 (×3): 50 ug via INTRAVENOUS
  Administered 2013-10-30: 75 ug via INTRAVENOUS
  Administered 2013-10-30 (×2): 50 ug via INTRAVENOUS
  Administered 2013-10-30: 25 ug via INTRAVENOUS
  Administered 2013-10-30: 50 ug via INTRAVENOUS

## 2013-10-30 MED ORDER — ROCURONIUM BROMIDE 50 MG/5ML IV SOLN
INTRAVENOUS | Status: AC
  Filled 2013-10-30: qty 1

## 2013-10-30 MED ORDER — MAGNESIUM SULFATE 40 MG/ML IJ SOLN
2.0000 g | Freq: Every day | INTRAMUSCULAR | Status: DC | PRN
  Filled 2013-10-30: qty 50

## 2013-10-30 MED ORDER — NITROGLYCERIN IN D5W 200-5 MCG/ML-% IV SOLN
2.0000 ug/min | INTRAVENOUS | Status: DC
  Filled 2013-10-30: qty 250

## 2013-10-30 MED ORDER — PROPOFOL 10 MG/ML IV BOLUS
INTRAVENOUS | Status: DC | PRN
  Administered 2013-10-30: 140 mg via INTRAVENOUS

## 2013-10-30 MED ORDER — HEPARIN SODIUM (PORCINE) 1000 UNIT/ML IJ SOLN
INTRAMUSCULAR | Status: DC | PRN
  Administered 2013-10-30: 8000 [IU] via INTRAVENOUS
  Administered 2013-10-30 (×2): 2000 [IU] via INTRAVENOUS

## 2013-10-30 MED ORDER — HEPARIN SODIUM (PORCINE) 1000 UNIT/ML IJ SOLN
INTRAMUSCULAR | Status: AC
  Filled 2013-10-30: qty 1

## 2013-10-30 MED ORDER — ROCURONIUM BROMIDE 100 MG/10ML IV SOLN
INTRAVENOUS | Status: DC | PRN
  Administered 2013-10-30: 50 mg via INTRAVENOUS

## 2013-10-30 MED ORDER — ONDANSETRON HCL 4 MG/2ML IJ SOLN
INTRAMUSCULAR | Status: AC
  Filled 2013-10-30: qty 2

## 2013-10-30 MED ORDER — LACTATED RINGERS IV SOLN
INTRAVENOUS | Status: DC | PRN
  Administered 2013-10-30 (×3): via INTRAVENOUS

## 2013-10-30 MED ORDER — NEOSTIGMINE METHYLSULFATE 10 MG/10ML IV SOLN
INTRAVENOUS | Status: DC | PRN
  Administered 2013-10-30: 3 mg via INTRAVENOUS

## 2013-10-30 MED ORDER — CEFAZOLIN SODIUM 1-5 GM-% IV SOLN: 1.0000 g | INTRAVENOUS | Status: DC

## 2013-10-30 MED ORDER — LIDOCAINE HCL (CARDIAC) 20 MG/ML IV SOLN
INTRAVENOUS | Status: DC | PRN
  Administered 2013-10-30: 80 mg via INTRAVENOUS

## 2013-10-30 MED ORDER — MIDAZOLAM HCL 2 MG/2ML IJ SOLN
INTRAMUSCULAR | Status: AC
  Filled 2013-10-30: qty 2

## 2013-10-30 MED ORDER — EPHEDRINE SULFATE 50 MG/ML IJ SOLN
INTRAMUSCULAR | Status: AC
  Filled 2013-10-30: qty 1

## 2013-10-30 MED ORDER — 0.9 % SODIUM CHLORIDE (POUR BTL) OPTIME
TOPICAL | Status: DC | PRN
  Administered 2013-10-30 (×2): 1000 mL

## 2013-10-30 MED ORDER — PROPOFOL 10 MG/ML IV BOLUS
INTRAVENOUS | Status: AC
  Filled 2013-10-30: qty 20

## 2013-10-30 MED ORDER — PHENYLEPHRINE 40 MCG/ML (10ML) SYRINGE FOR IV PUSH (FOR BLOOD PRESSURE SUPPORT)
PREFILLED_SYRINGE | INTRAVENOUS | Status: AC
  Filled 2013-10-30: qty 10

## 2013-10-30 MED ORDER — DEXAMETHASONE SODIUM PHOSPHATE 10 MG/ML IJ SOLN
INTRAMUSCULAR | Status: AC
  Filled 2013-10-30: qty 1

## 2013-10-30 MED ORDER — PHENYLEPHRINE HCL 10 MG/ML IJ SOLN
10.0000 mg | INTRAVENOUS | Status: DC | PRN
  Administered 2013-10-30: 5 ug/min via INTRAVENOUS

## 2013-10-30 MED ORDER — ALBUMIN HUMAN 5 % IV SOLN
INTRAVENOUS | Status: DC | PRN
  Administered 2013-10-30 (×2): via INTRAVENOUS

## 2013-10-30 MED ORDER — ALTEPLASE 100 MG IV SOLR
10.0000 mg | INTRAVENOUS | Status: AC
  Administered 2013-10-30: 10 mg
  Filled 2013-10-30: qty 10

## 2013-10-30 MED ORDER — HEMOSTATIC AGENTS (NO CHARGE) OPTIME
TOPICAL | Status: DC | PRN
  Administered 2013-10-30: 1 via TOPICAL

## 2013-10-30 MED ORDER — SODIUM CHLORIDE 0.9 % IJ SOLN
INTRAMUSCULAR | Status: AC
Start: 2013-10-30 — End: 2013-10-30
  Filled 2013-10-30: qty 10

## 2013-10-30 MED ORDER — SODIUM CHLORIDE 0.9 % IR SOLN
Status: DC | PRN
  Administered 2013-10-30: 11:00:00

## 2013-10-30 MED ORDER — METHYLPREDNISOLONE SODIUM SUCC 125 MG IJ SOLR
INTRAMUSCULAR | Status: DC | PRN
  Administered 2013-10-30: 125 mg via INTRAVENOUS

## 2013-10-30 MED ORDER — MIDAZOLAM HCL 5 MG/5ML IJ SOLN
INTRAMUSCULAR | Status: DC | PRN
  Administered 2013-10-30: 2 mg via INTRAVENOUS

## 2013-10-30 MED ORDER — GLYCOPYRROLATE 0.2 MG/ML IJ SOLN
INTRAMUSCULAR | Status: DC | PRN
  Administered 2013-10-30: 0.4 mg via INTRAVENOUS

## 2013-10-30 SURGICAL SUPPLY — 69 items
BANDAGE ELASTIC 4 VELCRO ST LF (GAUZE/BANDAGES/DRESSINGS) IMPLANT
BANDAGE ESMARK 6X9 LF (GAUZE/BANDAGES/DRESSINGS) IMPLANT
BNDG ESMARK 6X9 LF (GAUZE/BANDAGES/DRESSINGS)
CANISTER SUCTION 2500CC (MISCELLANEOUS) ×3 IMPLANT
CATH EMB 2FR 60CM (CATHETERS) ×3 IMPLANT
CATH EMB 3FR 80CM (CATHETERS) ×3 IMPLANT
CATH EMB 4FR 80CM (CATHETERS) ×3 IMPLANT
CATH EMB 5FR 80CM (CATHETERS) ×3 IMPLANT
CLIP TI MEDIUM 24 (CLIP) ×3 IMPLANT
CLIP TI WIDE RED SMALL 24 (CLIP) ×3 IMPLANT
CUFF TOURNIQUET SINGLE 24IN (TOURNIQUET CUFF) IMPLANT
CUFF TOURNIQUET SINGLE 34IN LL (TOURNIQUET CUFF) IMPLANT
CUFF TOURNIQUET SINGLE 44IN (TOURNIQUET CUFF) IMPLANT
DERMABOND ADVANCED (GAUZE/BANDAGES/DRESSINGS) ×2
DERMABOND ADVANCED .7 DNX12 (GAUZE/BANDAGES/DRESSINGS) ×1 IMPLANT
DRAIN CHANNEL 15F RND FF W/TCR (WOUND CARE) IMPLANT
DRAPE X-RAY CASS 24X20 (DRAPES) IMPLANT
DRSG COVADERM 4X10 (GAUZE/BANDAGES/DRESSINGS) IMPLANT
DRSG COVADERM 4X8 (GAUZE/BANDAGES/DRESSINGS) IMPLANT
ELECT REM PT RETURN 9FT ADLT (ELECTROSURGICAL) ×3
ELECTRODE REM PT RTRN 9FT ADLT (ELECTROSURGICAL) ×1 IMPLANT
EVACUATOR SILICONE 100CC (DRAIN) ×3 IMPLANT
GLOVE BIO SURGEON STRL SZ 6.5 (GLOVE) ×2 IMPLANT
GLOVE BIO SURGEONS STRL SZ 6.5 (GLOVE) ×1
GLOVE BIOGEL PI IND STRL 6.5 (GLOVE) ×2 IMPLANT
GLOVE BIOGEL PI IND STRL 7.0 (GLOVE) ×1 IMPLANT
GLOVE BIOGEL PI IND STRL 7.5 (GLOVE) ×2 IMPLANT
GLOVE BIOGEL PI INDICATOR 6.5 (GLOVE) ×4
GLOVE BIOGEL PI INDICATOR 7.0 (GLOVE) ×2
GLOVE BIOGEL PI INDICATOR 7.5 (GLOVE) ×4
GLOVE ECLIPSE 6.5 STRL STRAW (GLOVE) ×6 IMPLANT
GLOVE SURG SS PI 7.5 STRL IVOR (GLOVE) ×6 IMPLANT
GOWN STRL REUS W/ TWL LRG LVL3 (GOWN DISPOSABLE) ×2 IMPLANT
GOWN STRL REUS W/ TWL XL LVL3 (GOWN DISPOSABLE) ×2 IMPLANT
GOWN STRL REUS W/TWL LRG LVL3 (GOWN DISPOSABLE) ×4
GOWN STRL REUS W/TWL XL LVL3 (GOWN DISPOSABLE) ×4
HEMOSTAT SNOW SURGICEL 2X4 (HEMOSTASIS) ×3 IMPLANT
KIT BASIN OR (CUSTOM PROCEDURE TRAY) ×3 IMPLANT
KIT ROOM TURNOVER OR (KITS) ×3 IMPLANT
MARKER GRAFT CORONARY BYPASS (MISCELLANEOUS) IMPLANT
NS IRRIG 1000ML POUR BTL (IV SOLUTION) ×6 IMPLANT
PACK PERIPHERAL VASCULAR (CUSTOM PROCEDURE TRAY) ×3 IMPLANT
PAD ARMBOARD 7.5X6 YLW CONV (MISCELLANEOUS) ×6 IMPLANT
PADDING CAST COTTON 6X4 STRL (CAST SUPPLIES) IMPLANT
PROBE PENCIL 8 MHZ STRL DISP (MISCELLANEOUS) ×3 IMPLANT
SET COLLECT BLD 21X3/4 12 (NEEDLE) ×3 IMPLANT
SHEATH AVANTI 11CM 5FR (MISCELLANEOUS) ×3 IMPLANT
SPONGE LAP 18X18 X RAY DECT (DISPOSABLE) ×3 IMPLANT
STOPCOCK 4 WAY LG BORE MALE ST (IV SETS) ×3 IMPLANT
SUT ETHILON 3 0 PS 1 (SUTURE) IMPLANT
SUT PROLENE 5 0 C 1 24 (SUTURE) IMPLANT
SUT PROLENE 6 0 BV (SUTURE) ×12 IMPLANT
SUT PROLENE 7 0 BV 1 (SUTURE) ×3 IMPLANT
SUT PROLENE 7 0 BV1 MDA (SUTURE) ×3 IMPLANT
SUT SILK 2 0 SH (SUTURE) ×3 IMPLANT
SUT SILK 3 0 (SUTURE)
SUT SILK 3-0 18XBRD TIE 12 (SUTURE) IMPLANT
SUT VIC AB 2-0 CT1 27 (SUTURE) ×2
SUT VIC AB 2-0 CT1 TAPERPNT 27 (SUTURE) ×1 IMPLANT
SUT VIC AB 3-0 SH 27 (SUTURE) ×6
SUT VIC AB 3-0 SH 27X BRD (SUTURE) ×3 IMPLANT
SUT VICRYL 4-0 PS2 18IN ABS (SUTURE) ×3 IMPLANT
SYRINGE 1CC SLIP TB (MISCELLANEOUS) ×3 IMPLANT
SYRINGE 3CC LL L/F (MISCELLANEOUS) ×6 IMPLANT
TOWEL OR 17X24 6PK STRL BLUE (TOWEL DISPOSABLE) ×6 IMPLANT
TRAY FOLEY CATH 16FRSI W/METER (SET/KITS/TRAYS/PACK) IMPLANT
TUBING EXTENTION W/L.L. (IV SETS) ×3 IMPLANT
UNDERPAD 30X30 INCONTINENT (UNDERPADS AND DIAPERS) ×3 IMPLANT
WATER STERILE IRR 1000ML POUR (IV SOLUTION) IMPLANT

## 2013-10-30 NOTE — Progress Notes (Signed)
Called and updated wife about pt going back to surgery.

## 2013-10-30 NOTE — Progress Notes (Signed)
  Progress Note    10/30/2013 8:30 AM 7 Days Post-Op  Subjective:  No complaints  afebrile  Filed Vitals:   10/30/13 0805  BP:   Pulse:   Temp: 98.6 F (37 C)  Resp:     Physical Exam: Lungs:  Non labored Incisions:  C/d/i without hematoma Extremities:  There is no doppler flow in the left foot and it is mottled.  CBC    Component Value Date/Time   WBC 11.4* 10/30/2013 0239   RBC 3.58* 10/30/2013 0239   HGB 11.1* 10/30/2013 0239   HCT 31.8* 10/30/2013 0239   PLT 237 10/30/2013 0239   MCV 88.8 10/30/2013 0239   MCH 31.0 10/30/2013 0239   MCHC 34.9 10/30/2013 0239   RDW 13.7 10/30/2013 0239   LYMPHSABS 1.6 10/24/2013 0635   MONOABS 1.0 10/24/2013 0635   EOSABS 0.0 10/24/2013 0635   BASOSABS 0.0 10/24/2013 0635    BMET    Component Value Date/Time   NA 142 10/30/2013 0239   K 4.2 10/30/2013 0239   CL 107 10/30/2013 0239   CO2 24 10/30/2013 0239   GLUCOSE 110* 10/30/2013 0239   BUN 16 10/30/2013 0239   CREATININE 0.87 10/30/2013 0239   CALCIUM 8.7 10/30/2013 0239   GFRNONAA 85* 10/30/2013 0239   GFRAA >90 10/30/2013 0239    INR    Component Value Date/Time   INR 1.15 10/29/2013 0238     Intake/Output Summary (Last 24 hours) at 10/30/13 0830 Last data filed at 10/30/13 0654  Gross per 24 hour  Intake 6616.5 ml  Output   2567 ml  Net 4049.5 ml   JP Drain output:  85cc  Assessment:  68 y.o. male is s/p:  1: Left femoral to posterior tibial bypass graft with ipsilateral non-reversed translocated greater saphenous vein  #2: Thrombectomy of left posterior tibial artery  #3: Ligation of left below knee popliteal artery  7 Days Post-Op  Plan: -there is no doppler flow in the left foot.  Will take back to the OR emergently for thrombectomy    Doreatha Massed, PA-C [facility] [phone] 10/30/2013 8:30 AM    I agree with the above.  The patient lost doppler signals this am.  He will be taken back to the OR now  [employer]

## 2013-10-30 NOTE — Progress Notes (Signed)
Unable to dopple pedal or posterior tibial pulse in left foot, faint mid calf graft pulse doppled, foot warm but mottled. Dr. Myra Gianotti paged.

## 2013-10-30 NOTE — Progress Notes (Signed)
ANTIBIOTIC CONSULT NOTE - Follow-up  Pharmacy Consult for vancomycin and Zosyn Indication: sepsis - unknown source  Allergies  Allergen Reactions  . Omnipaque [Iohexol] Rash    Pt requires full premeds and needs to bring a driver.  Broke out in rash on back and arms.  Ok w/ premeds  . Oxytetracycline Rash    Terramycin causes rash    Patient Measurements: Height: 6' 2.02" (188 cm) Weight: 196 lb 1.6 oz (88.95 kg) IBW/kg (Calculated) : 82.24 Adjusted Body Weight: 87kg  Vital Signs: Temp: 98.9 F (37.2 C) (09/18 1430) Temp src: Oral (09/18 0805) BP: 114/60 mmHg (09/18 1610) Pulse Rate: 77 (09/18 1610) Intake/Output from previous day: 09/17 0701 - 09/18 0700 In: 6639 [I.V.:5255; Blood:559; IV Piggyback:825] Out: 2692 [Urine:2105; Drains:85; Stool:2; Blood:500] Intake/Output from this shift: Total I/O In: 3200 [I.V.:2700; IV Piggyback:500] Out: 1755 [Urine:1345; Drains:10; Blood:400]  Labs:  Recent Labs  10/28/13 0410 10/29/13 0238 10/29/13 1651 10/30/13 0239  WBC 8.6 8.4  --  11.4*  HGB 10.6* 10.7* 9.5* 11.1*  PLT 202 228  --  237  CREATININE  --  1.00  --  0.87   Estimated Creatinine Clearance: 91.9 ml/min (by C-G formula based on Cr of 0.87).    Microbiology: Recent Results (from the past 720 hour(s))  MRSA PCR SCREENING     Status: None   Collection Time    10/23/13  4:50 PM      Result Value Ref Range Status   MRSA by PCR NEGATIVE  NEGATIVE Final   Comment:            The GeneXpert MRSA Assay (FDA     approved for NASAL specimens     only), is one component of a     comprehensive MRSA colonization     surveillance program. It is not     intended to diagnose MRSA     infection nor to guide or     monitor treatment for     MRSA infections.  CULTURE, BLOOD (ROUTINE X 2)     Status: None   Collection Time    10/24/13 10:57 PM      Result Value Ref Range Status   Specimen Description BLOOD LEFT WRIST   Final   Special Requests BOTTLES DRAWN AEROBIC  AND ANAEROBIC 10CC EACH   Final   Culture  Setup Time     Final   Value: 10/25/2013 12:52     Performed at [facility]   Culture     Final   Value:        BLOOD CULTURE RECEIVED NO GROWTH TO DATE CULTURE WILL BE HELD FOR 5 DAYS BEFORE ISSUING A FINAL NEGATIVE REPORT     Performed at [facility]   Report Status PENDING   Incomplete  CULTURE, BLOOD (ROUTINE X 2)     Status: None   Collection Time    10/24/13 11:00 PM      Result Value Ref Range Status   Specimen Description BLOOD LEFT HAND   Final   Special Requests BOTTLES DRAWN AEROBIC AND ANAEROBIC 10CC EACH   Final   Culture  Setup Time     Final   Value: 10/25/2013 12:52     Performed at [facility]   Culture     Final   Value:        BLOOD CULTURE RECEIVED NO GROWTH TO DATE CULTURE WILL BE HELD FOR 5 DAYS BEFORE ISSUING A FINAL NEGATIVE  REPORT     Note: Culture results may be compromised due to an excessive volume of blood received in culture bottles.     Performed at [facility]   Report Status PENDING   Incomplete  CULTURE, RESPIRATORY (NON-EXPECTORATED)     Status: None   Collection Time    10/25/13  9:34 AM      Result Value Ref Range Status   Specimen Description TRACHEAL ASPIRATE   Final   Special Requests NONE   Final   Gram Stain     Final   Value: FEW WBC PRESENT,BOTH PMN AND MONONUCLEAR     RARE SQUAMOUS EPITHELIAL CELLS PRESENT     NO ORGANISMS SEEN     Performed at [facility]   Culture     Final   Value: Non-Pathogenic Oropharyngeal-type Flora Isolated.     Performed at [facility]   Report Status 10/28/2013 FINAL   Final  CLOSTRIDIUM DIFFICILE BY PCR     Status: None   Collection Time    10/28/13  3:09 PM      Result Value Ref Range Status   C difficile by pcr NEGATIVE  NEGATIVE Final    Assessment: 68 YO M with ischemic left foot s/p fem-pop 9/17 and urgent thrombectomy for occluded graft on 9/18.  Pt continues on Vancomycin and Zosyn Day#5 for r/o  sepsis in the setting of hypotension requiring pressors despite adequate Hgb with recent bleed. Unknown source. PCT 0.15. Afeb. WBC slightly up.  Cultures ngtd.  Vancomycin trough 10.3 mcg/ml (subtherapeutic) on  IV q12h Ideally would like to check a trough on this new regimen, however patient has missed doses around timing of surgical procedures over the last 24 hours.  Will have to delay trough until pt back at steady state.  Goal of Therapy:  Vancomycin trough level 15-20 mcg/ml  Plan:  - Continue  Vancomycin to  IV q12h - Zosyn 3.375g IV q8h (each dose over 4 hours) - F/u micro data, clinical progression, renal function, and trough at new Css if continues - F/u length of therapy.  [employer], Pharm.D., BCPS Clinical Pharmacist Pager [phone] 10/30/2013 5:10 PM

## 2013-10-30 NOTE — Transfer of Care (Signed)
Immediate Anesthesia Transfer of Care Note  Patient: [name] [name]  Procedure(s) Performed: Procedure(s): Reexploration of Femoral-Posterior Tibial Bypass; Thrombectomy of Bypass; Left Leg Angiogram; Posterior Tibial Vein Patch Angioplasty (Left)  Patient Location: PACU  Anesthesia Type:General  Level of Consciousness: awake, alert , oriented and patient cooperative  Airway & Oxygen Therapy: Patient Spontanous Breathing and Patient connected to nasal cannula oxygen  Post-op Assessment: Report given to PACU RN, Post -op Vital signs reviewed and stable and Patient moving all extremities X 4  Post vital signs: Reviewed and stable  Complications: No apparent anesthesia complications

## 2013-10-30 NOTE — OR Nursing (Signed)
800 mcg nitroglycerin given intraarterial intraoperatively.

## 2013-10-30 NOTE — Anesthesia Postprocedure Evaluation (Signed)
  Anesthesia Post-op Note  Patient: [name] [name]  Procedure(s) Performed: Procedure(s): Reexploration of Femoral-Posterior Tibial Bypass; Thrombectomy of Bypass; Left Leg Angiogram; Posterior Tibial Vein Patch Angioplasty (Left)  Patient Location: PACU  Anesthesia Type:General  Level of Consciousness: awake, alert  and oriented  Airway and Oxygen Therapy: Patient Spontanous Breathing  Post-op Pain: none  Post-op Assessment: Post-op Vital signs reviewed  Post-op Vital Signs: Reviewed  Last Vitals:  Filed Vitals:   10/30/13 1517  BP: 126/59  Pulse: 82  Temp:   Resp: 18    Complications: No apparent anesthesia complications

## 2013-10-30 NOTE — Op Note (Signed)
Patient name: [name] [name] MRN: [id number] DOB: 1946/01/28 Sex: male  10/23/2013 - 10/30/2013 Pre-operative Diagnosis: Occluded left femoral posterior tibial bypass Post-operative diagnosis:  Same Surgeon:  Jorge Ny Assistants:  Maris Berger Procedure:   #1: Thrombectomy of left femoral to posterior tibial artery bypass graft   #2: Reexploration of the left common femoral and left posterior tibial artery   #3: Exposure of the posterior tibial artery at the ankle with vein patch angioplasty   #4: Intra-arterial administration of nitroglycerin and TPA   #5: Left lower extremity angiogram Anesthesia:  Gen. Blood Loss:  See anesthesia record Specimens:  None  Findings:  Filling defect identified at the distal anastomosis.  I took down the distal anastomosis and opened it an additional 2 mm.  A 3 mm probe easily advanced into the posterior tibial artery.  I redid the distal anastomosis.  There appeared to be an occlusion at the posterior tibial artery at the ankle.  Therefore I opened up the posterior tibial artery at the ankle.  I made a longitudinal arteriotomy and administered TPA as well as nitroglycerin.  I was able to get a #2 Fogarty catheter all around the plantar arch.  No significant clot was evacuated.  Vein patch angioplasty was performed at this level.  Indications:  The patient underwent left femoral to posterior tibial artery bypass graft yesterday.  He did well overnight but his graft occluded around 8:00 this morning.  He comes back for thrombectomy.  Procedure:  The patient was identified in the holding area and taken to [facility] OR ROOM 11  The patient was then placed supine on the table. general anesthesia was administered.  The patient was prepped and draped in the usual sterile fashion.  A time out was called and antibiotics were administered.  I initially began by opening the below knee incision.  The previous sutures were removed.  I exposed the vein bypass graft  which was occluded.  I also exposed the posterior tibial artery.    the patient was fully heparinized.  I made a longitudinal arteriotomy in the fluid of the vein graft.  I passed a 5 Fogarty and 6 Fogarty proximally.  I had trouble getting the Fogarty into the iliac artery, however I was able to evacuate the thrombus and good pulsatile flow.  I then passed a #2 and 3 Fogarty distally.  Clot was evacuated and good backbleeding was est.  I was able to advance the #2 Fogarty out into what I perceived to be the plantar arch.  Because I could not advance the Fogarty catheter across the proximal anastomosis, I elected to reopen the groin incision.  I exposed the common femoral, superficial femoral, profunda femoral arteries.  These were then occluded.  I made a longitudinal venotomy in the hood of the bypass graft.  I passed a Fogarty catheter proximally and distally.  There was no residual thrombus.  There was no problem getting the catheter in position.  I then closed this venotomy with a running 6-0 Prolene.  Prior doing so I placed a 5 [place] sheath through which I performed a left lower extremity angiogram.  This showed that the vein graft was patent, however there was a filling defect at the level of the distal anastomosis.  It was uncertain whether this was a plaque or thrombus.  In addition there was poor opacification of the pedal vessels.  After the angiogram, I removed the sheath and closed the remaining portion of  the venotomy.  I then occluded the bypass graft I took down the distal anastomosis and used Potts scissors to open up the posterior tibial artery an additional 2-3 mm.  I did not see any plaque or thrombus here.  I then redid the distal anastomosis with a running 6-0 Prolene.  I then did an additional arteriogram.  This showed that the filling defect at the distal anastomosis had now resolved.  The runoff was unchanged.  There did appear to be termination of the posterior tibial artery at the level  of the ankle.  I felt that I needed to further evaluate this and therefore I made an incision posterior to the medial malleolus.  Through this incision I exposed the posterior tibial artery which was calcified and measuring about 3.5 mm.  It was encircled with Vesseloops and then occluded.  I made a arteriotomy with #11 blade which was extended longitudinally with Potts scissors.  I then selected a #2 Fogarty catheter.  I was able to advance this approximately 40 mm.  This would correlate with the colon completely around the plantar arch.  I was unable to evacuate clot or plaque.  However, after this maneuver I did get excellent backbleeding.  I injected 100 mcg of nitroglycerin as well as 10 mg of TPA into the posterior tibial artery.  I then selected a piece of the saphenous vein off of the branch in the medial below knee incision.  I used this to perform patch angioplasty with a running 7-0 Prolene of the posterior tibial artery.  Prior to completing the anastomosis, the appropriate flushing maneuvers were performed.  Once the anastomosis was completed, bloodflow was reestablish.  At this point the patient had a brisk Doppler signal in the posterior tibial artery as well as the dorsalis pedis.  I did not feel any other intervention was required at this time.  I ended up getting 25 mg of protamine to assist with hemostasis.  The groin incision was closed with a 2-0 Vicryl followed by multiple layers of 3-0 Vicryl and a 4-0 Vicryl.  The medial below knee incision was closed with a 2-0 Vicryl.  I did not reapproximate the fascia.  The skin was closed with 4-0 Vicryl.  The ankle incision was closed with 2 layers of 3-0 Vicryl.  I placed 3 horizontal mattress nylon sutures.  Dermabond was then applied.  There were no immediate complications.  Disposition:  To PACU in stable condition.   Juleen China, M.D. [facility] [place] Office: [phone] Pager:  [phone]

## 2013-10-30 NOTE — Anesthesia Preprocedure Evaluation (Addendum)
Anesthesia Evaluation  Patient identified by MRN, date of birth, ID band Patient awake    Reviewed: Allergy & Precautions, H&P , NPO status , Patient's Chart, lab work & pertinent test results, reviewed documented beta blocker date and time   Airway Mallampati: II TM Distance: >3 FB Neck ROM: Full    Dental  (+) Teeth Intact, Dental Advisory Given   Pulmonary former smoker,    Pulmonary exam normal       Cardiovascular hypertension, Pt. on medications and Pt. on home beta blockers + CAD, + Past MI, + Cardiac Stents and + Peripheral Vascular Disease  10/26/13 TTE: EF 55-60%. Diastolic dysfunction. Normal valves.   Neuro/Psych negative neurological ROS     GI/Hepatic negative GI ROS, Neg liver ROS,   Endo/Other    Renal/GU Renal InsufficiencyRenal disease     Musculoskeletal   Abdominal   Peds  Hematology   Anesthesia Other Findings   Reproductive/Obstetrics                         Anesthesia Physical  Anesthesia Plan  ASA: III and emergent  Anesthesia Plan: General   Post-op Pain Management:    Induction: Intravenous  Airway Management Planned: Oral ETT  Additional Equipment:   Intra-op Plan:   Post-operative Plan: Extubation in OR  Informed Consent: I have reviewed the patients History and Physical, chart, labs and discussed the procedure including the risks, benefits and alternatives for the proposed anesthesia with the patient or authorized representative who has indicated his/her understanding and acceptance.   Dental advisory given  Plan Discussed with: CRNA, Anesthesiologist and Surgeon  Anesthesia Plan Comments:        Anesthesia Quick Evaluation

## 2013-10-30 NOTE — Anesthesia Procedure Notes (Signed)
Procedure Name: Intubation Date/Time: 10/30/2013 9:55 AM Performed by: Angelica Pou Pre-anesthesia Checklist: Patient identified, Patient being monitored, Emergency Drugs available, Timeout performed and Suction available Patient Re-evaluated:Patient Re-evaluated prior to inductionOxygen Delivery Method: Circle system utilized Preoxygenation: Pre-oxygenation with 100% oxygen Intubation Type: IV induction Ventilation: Mask ventilation without difficulty Laryngoscope Size: Mac and 4 (Gr I view with B.U.R.P. applied) Grade View: Grade I Tube type: Oral Tube size: 7.5 mm Number of attempts: 1 Airway Equipment and Method: Stylet Placement Confirmation: ETT inserted through vocal cords under direct vision,  breath sounds checked- equal and bilateral and positive ETCO2 Secured at: 21 cm Tube secured with: Tape Dental Injury: Teeth and Oropharynx as per pre-operative assessment

## 2013-10-30 NOTE — Progress Notes (Signed)
OT Cancellation Note   10/30/13 1000  OT Visit Information  Last OT Received On 10/30/13  Reason Eval/Treat Not Completed Patient at procedure or test/ unavailable  [facility], OTR/L  [phone] 10/30/2013

## 2013-10-30 NOTE — Progress Notes (Signed)
PT Cancellation Note  Patient Details Name: BESSIE LIVINGOOD MRN: [id number] DOB: May 06, 1945   Cancelled Treatment:    Reason Eval/Treat Not Completed: Medical issues which prohibited therapy (pt being taken back to OR)   Argie Applegate 10/30/2013, 9:03 AM

## 2013-10-30 NOTE — Progress Notes (Signed)
Traveled with pt to short stay. Report given to Nurse Anaesthetist.

## 2013-10-31 LAB — CBC
HEMATOCRIT: 25.4 % — AB (ref 39.0–52.0)
HEMOGLOBIN: 8.8 g/dL — AB (ref 13.0–17.0)
MCH: 31.3 pg (ref 26.0–34.0)
MCHC: 34.6 g/dL (ref 30.0–36.0)
MCV: 90.4 fL (ref 78.0–100.0)
Platelets: 216 10*3/uL (ref 150–400)
RBC: 2.81 MIL/uL — AB (ref 4.22–5.81)
RDW: 13.8 % (ref 11.5–15.5)
WBC: 9.7 10*3/uL (ref 4.0–10.5)

## 2013-10-31 LAB — BASIC METABOLIC PANEL
Anion gap: 10 (ref 5–15)
BUN: 17 mg/dL (ref 6–23)
CO2: 23 meq/L (ref 19–32)
Calcium: 8.3 mg/dL — ABNORMAL LOW (ref 8.4–10.5)
Chloride: 110 mEq/L (ref 96–112)
Creatinine, Ser: 0.95 mg/dL (ref 0.50–1.35)
GFR calc Af Amer: >90 mL/min (ref >90)
GFR calc non Af Amer: 82 mL/min — ABNORMAL LOW (ref >90)
GLUCOSE: 107 mg/dL — AB (ref 70–99)
POTASSIUM: 4 meq/L (ref 3.7–5.3)
Sodium: 143 mEq/L (ref 137–147)

## 2013-10-31 LAB — CULTURE, BLOOD (ROUTINE X 2)
CULTURE: NO GROWTH
Culture: NO GROWTH

## 2013-10-31 LAB — APTT: aPTT: 29 seconds (ref 24–37)

## 2013-10-31 MED ORDER — TAMSULOSIN HCL 0.4 MG PO CAPS
0.4000 mg | ORAL_CAPSULE | Freq: Every day | ORAL | Status: DC
  Filled 2013-10-31 (×2): qty 1

## 2013-10-31 NOTE — Progress Notes (Signed)
Subjective: Interval History: none.Marland Kitchen up in chair. Sore from surgery but otherwise comfortable.  Objective: Vital signs in last 24 hours: Temp:  [98.1 F (36.7 C)-99.2 F (37.3 C)] 98.1 F (36.7 C) (09/19 0700) Pulse Rate:  [58-94] 64 (09/19 0600) Resp:  [10-23] 22 (09/19 0600) BP: (96-129)/(45-75) 108/63 mmHg (09/19 0600) SpO2:  [91 %-98 %] 97 % (09/19 0600)  Intake/Output from previous day: 09/18 0701 - 09/19 0700 In: 6227.8 [I.V.:5177.8; IV Piggyback:1050] Out: 3715 [Urine:3255; Drains:60; Blood:400] Intake/Output this shift:    Bruising and left groin. Incisions healing. Audible posterior tibial and dorsalis pedis signal. Foot warm.  Lab Results:  Recent Labs  10/30/13 1825 10/31/13 0104  WBC 9.6 9.7  HGB 9.4* 8.8*  HCT 26.9* 25.4*  PLT 220 216   BMET  Recent Labs  10/30/13 0239 10/31/13 0104  NA 142 143  K 4.2 4.0  CL 107 110  CO2 24 23  GLUCOSE 110* 107*  BUN 16 17  CREATININE 0.87 0.95  CALCIUM 8.7 8.3*    Studies/Results: Ct Head Wo Contrast  10/24/2013   CLINICAL DATA:  Altered mental status  EXAM: CT HEAD WITHOUT CONTRAST  TECHNIQUE: Contiguous axial images were obtained from the base of the skull through the vertex without intravenous contrast.  COMPARISON:  None.  FINDINGS: Mild to moderate age-related cortical atrophy. No evidence of hemorrhage or extra-axial fluid. No evidence of infarct or mass. No hydrocephalus. Calvarium intact. No significant inflammatory change in the visualized portions of the paranasal sinuses.  IMPRESSION: No acute intracranial abnormalities.   Electronically Signed   By: Esperanza Heir M.D.   On: 10/24/2013 10:09   Ir Angiogram Extremity Left  10/23/2013   CLINICAL DATA:  Occluded left popliteal stented aneurysm, rest pain, peripheral vascular disease  EXAM: ULTRASOUND GUIDANCE FOR VASCULAR ACCESS  LEFT LOWER EXTREMITY ANGIOGRAM  SELECTIVE LEFT POSTERIOR TIBIAL PERIPHERAL ANGIOGRAM  INSERTION OF A 5 FRENCH INFUSION  CATHETER ACROSS THE LEFT POPLITEAL OCCLUSION FOR INITIATION OF INTRA-ARTERIAL THROMBOLYSIS  Date:  9/11/20159/12/2013 11:56 am  Radiologist:  M. Ruel Favors, MD  Guidance:  Ultrasound fluoroscopic  FLUOROSCOPY TIME:  3 min 12 seconds  MEDICATIONS AND MEDICAL HISTORY: 1 mg Versed, 25 mcg fentanyl  ANESTHESIA/SEDATION: 30 min  CONTRAST:  20mL VISIPAQUE IODIXANOL 320 MG/ML IV SOLN  COMPLICATIONS: No immediate  PROCEDURE: Informed consent was obtained from the patient following explanation of the procedure, risks, benefits and alternatives. The patient understands, agrees and consents for the procedure. All questions were addressed. A time out was performed.  Maximal barrier sterile technique utilized including caps, mask, sterile gowns, sterile gloves, large sterile drape, hand hygiene, and Betadine.  Under sterile conditions and local anesthesia, ultrasound micropuncture access was performed of the left femoral artery in antegrade fashion. Guidewire inserted followed by 4 [place] dilator. Six French sheath inserted. Contrast injection performed for left lower extremity angiogram. Left common femoral, profunda femoral, and superficial femoral arteries are patent proximally. Left distal SFA occlusion noted above the occluded popliteal stent.  Catheter and guidewire access were manipulated through the left distal SFA and popliteal occlusion. Catheter was advanced below the knee into the left posterior tibial artery. Injection of the left posterior tibial artery demonstrates intra-arterial position. Thrombus also present throughout the left posterior tibial artery and the adjacent small branches.  Guidewire inserted to advance a 90 cm Unifuse catheter across the occluded segment with a 40 cm infusion length. Images obtained for documentation. Trans arterial catheter thrombolysis will be initiated with TNK 0.5 milligrams/hour.  IMPRESSION: Left distal SFA and stented popliteal arterial ooclusion extending into the tibial  vessels.  Successful catheter access across the occluded segment to begin intra-arterial transcatheter thrombolysis.   Electronically Signed   By: Ruel Favors M.D.   On: 10/23/2013 12:40   Ir Angiogram Selective Each Additional Vessel  10/23/2013   CLINICAL DATA:  Occluded left popliteal stented aneurysm, rest pain, peripheral vascular disease  EXAM: ULTRASOUND GUIDANCE FOR VASCULAR ACCESS  LEFT LOWER EXTREMITY ANGIOGRAM  SELECTIVE LEFT POSTERIOR TIBIAL PERIPHERAL ANGIOGRAM  INSERTION OF A 5 FRENCH INFUSION CATHETER ACROSS THE LEFT POPLITEAL OCCLUSION FOR INITIATION OF INTRA-ARTERIAL THROMBOLYSIS  Date:  9/11/20159/12/2013 11:56 am  Radiologist:  M. Ruel Favors, MD  Guidance:  Ultrasound fluoroscopic  FLUOROSCOPY TIME:  3 min 12 seconds  MEDICATIONS AND MEDICAL HISTORY: 1 mg Versed, 25 mcg fentanyl  ANESTHESIA/SEDATION: 30 min  CONTRAST:  20mL VISIPAQUE IODIXANOL 320 MG/ML IV SOLN  COMPLICATIONS: No immediate  PROCEDURE: Informed consent was obtained from the patient following explanation of the procedure, risks, benefits and alternatives. The patient understands, agrees and consents for the procedure. All questions were addressed. A time out was performed.  Maximal barrier sterile technique utilized including caps, mask, sterile gowns, sterile gloves, large sterile drape, hand hygiene, and Betadine.  Under sterile conditions and local anesthesia, ultrasound micropuncture access was performed of the left femoral artery in antegrade fashion. Guidewire inserted followed by 4 [place] dilator. Six French sheath inserted. Contrast injection performed for left lower extremity angiogram. Left common femoral, profunda femoral, and superficial femoral arteries are patent proximally. Left distal SFA occlusion noted above the occluded popliteal stent.  Catheter and guidewire access were manipulated through the left distal SFA and popliteal occlusion. Catheter was advanced below the knee into the left posterior tibial  artery. Injection of the left posterior tibial artery demonstrates intra-arterial position. Thrombus also present throughout the left posterior tibial artery and the adjacent small branches.  Guidewire inserted to advance a 90 cm Unifuse catheter across the occluded segment with a 40 cm infusion length. Images obtained for documentation. Trans arterial catheter thrombolysis will be initiated with TNK 0.5 milligrams/hour.  IMPRESSION: Left distal SFA and stented popliteal arterial ooclusion extending into the tibial vessels.  Successful catheter access across the occluded segment to begin intra-arterial transcatheter thrombolysis.   Electronically Signed   By: Ruel Favors M.D.   On: 10/23/2013 12:40   Ir US Guide Vasc Access Left  10/23/2013   CLINICAL DATA:  Occluded left popliteal stented aneurysm, rest pain, peripheral vascular disease  EXAM: ULTRASOUND GUIDANCE FOR VASCULAR ACCESS  LEFT LOWER EXTREMITY ANGIOGRAM  SELECTIVE LEFT POSTERIOR TIBIAL PERIPHERAL ANGIOGRAM  INSERTION OF A 5 FRENCH INFUSION CATHETER ACROSS THE LEFT POPLITEAL OCCLUSION FOR INITIATION OF INTRA-ARTERIAL THROMBOLYSIS  Date:  9/11/20159/12/2013 11:56 am  Radiologist:  M. Ruel Favors, MD  Guidance:  Ultrasound fluoroscopic  FLUOROSCOPY TIME:  3 min 12 seconds  MEDICATIONS AND MEDICAL HISTORY: 1 mg Versed, 25 mcg fentanyl  ANESTHESIA/SEDATION: 30 min  CONTRAST:  20mL VISIPAQUE IODIXANOL 320 MG/ML IV SOLN  COMPLICATIONS: No immediate  PROCEDURE: Informed consent was obtained from the patient following explanation of the procedure, risks, benefits and alternatives. The patient understands, agrees and consents for the procedure. All questions were addressed. A time out was performed.  Maximal barrier sterile technique utilized including caps, mask, sterile gowns, sterile gloves, large sterile drape, hand hygiene, and Betadine.  Under sterile conditions and local anesthesia, ultrasound micropuncture access was performed of the left femoral  artery  in antegrade fashion. Guidewire inserted followed by 4 [place] dilator. Six French sheath inserted. Contrast injection performed for left lower extremity angiogram. Left common femoral, profunda femoral, and superficial femoral arteries are patent proximally. Left distal SFA occlusion noted above the occluded popliteal stent.  Catheter and guidewire access were manipulated through the left distal SFA and popliteal occlusion. Catheter was advanced below the knee into the left posterior tibial artery. Injection of the left posterior tibial artery demonstrates intra-arterial position. Thrombus also present throughout the left posterior tibial artery and the adjacent small branches.  Guidewire inserted to advance a 90 cm Unifuse catheter across the occluded segment with a 40 cm infusion length. Images obtained for documentation. Trans arterial catheter thrombolysis will be initiated with TNK 0.5 milligrams/hour.  IMPRESSION: Left distal SFA and stented popliteal arterial ooclusion extending into the tibial vessels.  Successful catheter access across the occluded segment to begin intra-arterial transcatheter thrombolysis.   Electronically Signed   By: Ruel Favors M.D.   On: 10/23/2013 12:40   Dg Chest Port 1 View  10/26/2013   CLINICAL DATA:  Followup respiratory failure  EXAM: PORTABLE CHEST - 1 VIEW  COMPARISON:  10/25/2013  FINDINGS: Cardiomediastinal silhouette is stable. Stable endotracheal and NG tube position. Worsening atelectasis or infiltrate in right middle lobe. Question small right pleural effusion. Left lung is clear. No pulmonary edema.  IMPRESSION: Worsening atelectasis or infiltrate in right middle lobe. Stable support apparatus. Question small right pleural effusion.   Electronically Signed   By: Natasha Mead M.D.   On: 10/26/2013 08:11   Dg Chest Port 1 View  10/25/2013   CLINICAL DATA:  Evaluate endotracheal tube placement.  EXAM: PORTABLE CHEST - 1 VIEW  COMPARISON:  Chest x-ray  10/24/2013.  FINDINGS: An endotracheal tube is in place with tip 7.5 cm above the carina. Transcutaneous defibrillator pads projecting over the mid and lower left hemithorax. Lung volumes are low, with probable partial atelectasis of the right middle lobe. No acute consolidative airspace disease. No pleural effusions. No evidence of pulmonary edema. Heart size and mediastinal contours are within normal limits.  IMPRESSION: 1. Support apparatus, as above. 2. Low lung volumes with partial atelectasis of the right middle lobe.   Electronically Signed   By: Trudie Reed M.D.   On: 10/25/2013 09:25   Dg Chest Port 1 View  10/24/2013   CLINICAL DATA:  Recheck endotracheal tube placement  EXAM: PORTABLE CHEST - 1 VIEW  COMPARISON:  10/24/2013 at 0722 hr  FINDINGS: Endotracheal tube unchanged with tip about 4.3 cm above the carina. Limited inspiratory effect. Lungs clear.  IMPRESSION: No change in position of endotracheal tube   Electronically Signed   By: Esperanza Heir M.D.   On: 10/24/2013 10:37   Dg Chest Port 1v Same Day  10/24/2013   CLINICAL DATA:  Hypotension.  EXAM: PORTABLE CHEST - 1 VIEW SAME DAY  COMPARISON:  11/24/2009  FINDINGS: Endotracheal tube is in good position. Heart size and pulmonary vascularity are normal and the lungs are clear. No osseous abnormality. No pneumothorax.  IMPRESSION: Endotracheal tube in good position.  Lungs are clear.   Electronically Signed   By: Geanie Cooley M.D.   On: 10/24/2013 08:06   Dg Ang/ext/uni/or Left  10/30/2013   CLINICAL DATA:  68 year old male with left lower extremity angiogram.  EXAM: Intraoperative fluoroscopic images of the left lower extremity. Three angiogram series are included.  Date:  9/18/20159/18/2015 2:17 pm  FLUOROSCOPY TIME:  1 min 24 seconds  MEDICATIONS AND MEDICAL HISTORY: Atherosclerosis  Peripheral vascular disease  COMPLICATIONS: NONE IMMEDIATELY APPARENT  ANESTHESIA/SEDATION: MANAGED IN THE OPERATING ROOM.  PROCEDURE: INTRAOPERATIVE  ANGIOGRAM.  FINDINGS: Three angiographic series are included of the left lower extremity. The series of 2 C and 7 CE represent a pre and post treatment of the same artery of the left lower extremity. It is uncertain the location given the lack of the anatomic landmarks. There is a at least 90% stenosis of the artery before treatment, with resolution of this stenosis on imaged 7 C. the angiographic post treatment image 7c demonstrates no residual stenosis though there is irregularity in the artery which appears to be related to a bypass graft.  Image 8c demonstrates a lateral view of the foot with atherosclerotic changes of the posterior tibial artery. There is occlusion of the common plantar artery with no filling of the plantar arch. Multiple collateral vessels of the foot. No visualized anterior tibial artery.  IMPRESSION:  Pre and post treatment intraoperative fluoroscopic angiogram demonstrates improved stenosis of the treated artery of the left lower leg.  Lateral foot angiogram demonstrates occlusion of the posterior tibial artery at the level of the common plantar artery.   Electronically Signed   By: Gilmer Mor O.D.   On: 10/30/2013 17:43   Dg Abd Portable 1v  10/25/2013   CLINICAL DATA:  Orogastric tube placement.  EXAM: PORTABLE ABDOMEN - 1 VIEW  COMPARISON:  12/29/2012.  FINDINGS: The distal end of an orogastric tube is noted with tip in the mid body of the stomach. Visualized bowel gas pattern is nonobstructive. And aorta bi-iliac stent graft is noted. Defibrillator pad projecting over the left side of the abdomen.  IMPRESSION: 1. Tip of orogastric tube is in the mid body of the stomach.   Electronically Signed   By: Trudie Reed M.D.   On: 10/25/2013 15:19   Ct Angio Abd/pel W/ And/or W/o  10/24/2013   CLINICAL DATA:  hemoharge in setting TPA use, possible enlarging AAA  EXAM: CT ANGIOGRAPHY ABDOMEN AND PELVIS  TECHNIQUE: Multidetector CT imaging of the abdomen and pelvis was performed  using the standard protocol during bolus administration of intravenous contrast. Multiplanar reconstructed images including MIPs were obtained and reviewed to evaluate the vascular anatomy.  CONTRAST:  80mL OMNIPAQUE IOHEXOL 350 MG/ML SOLN  COMPARISON:  11/21/2012 and earlier studies  FINDINGS: ARTERIAL FINDINGS:  Aorta: Visualized distal descending thoracic and suprarenal segments unremarkable. Patent bifurcated infrarenal aortic stent graft. No evidence of endoleak. Native aneurysm diameter 5.6 cm on image 133/601, previously 5.7 cm by my measurement.  Celiac axis:          Patent  Superior mesenteric:  Patent, with classic distal branch anatomy.  Left renal:           Single, patent.  Right renal:          Duplicated, inferior dominant, both patent.  Inferior mesenteric:  Origin occlusion.  Left iliac: Left limb of the stent graft extends to the mid common iliac artery. Mild scattered plaque in the distal native common iliac artery and internal iliac artery. External iliac is widely patent. Common femoral artery is widely patent. There is a 9 mm pseudoaneurysm from the distal common femoral. Profunda femoris branches patent. Visualized proximal SFA patent. Antegrade arterial sheath is in the proximal SFA.  Right iliac: Right limb of the stent graft extends to the distal common iliac artery. Minimal plaque in the native internal iliac artery which is mildly ectatic -  8 mm diameter. External iliac and common femoral artery patent. Visualized portions of proximal profunda femoris and SFA patent.  Venous findings:      Venous phase not obtained.  Review of the MIP images confirms the above findings.  Nonvascular findings: Minimal dependent atelectasis posteriorly in the visualized lung bases. Partially calcified subcentimeter stone in the dependent aspect of the nondilated gallbladder. 16 mm cyst in hepatic segment 3. Unremarkable spleen, adrenal glands, pancreas. Stable hepatic cysts. No hydronephrosis. Stomach,  small bowel, and colon are nondilated.Normal appendix. A few scattered sigmoid diverticula without adjacent inflammatory/edematous change. Urinary bladder is partially decompressed by Foley catheter. Marked prostatic enlargement with central coarse calcifications. Minimal spondylitic changes in the visualized lower thoracic and lumbar spine.  There is no retroperitoneal or pelvic hematoma. There is a large amount of probable blood throughout the subcutaneous tissues of the anterior right thigh.  IMPRESSION: 1. Patent infrarenal bifurcated aortic stent graft without endoleak. Slight decrease in native aneurysm sac diameter, 5.6 cm. 2. 9mm pseudoaneurysm from left common femoral artery with large anterior thigh hematoma. 3. No evidence of retroperitoneal or pelvic hematoma. Critical Value/emergent results were called by telephone at the time of interpretation on 10/24/2013 at 11:02 am to Dr. Imogene Burn, who verbally acknowledged these results.  4. Cholelithiasis 5. Marked prostatic enlargement.   Electronically Signed   By: Oley Balm M.D.   On: 10/24/2013 11:02   Ir Infusion Thrombol Arterial Initial (ms)  10/23/2013   CLINICAL DATA:  Occluded left popliteal stented aneurysm, rest pain, peripheral vascular disease  EXAM: ULTRASOUND GUIDANCE FOR VASCULAR ACCESS  LEFT LOWER EXTREMITY ANGIOGRAM  SELECTIVE LEFT POSTERIOR TIBIAL PERIPHERAL ANGIOGRAM  INSERTION OF A 5 FRENCH INFUSION CATHETER ACROSS THE LEFT POPLITEAL OCCLUSION FOR INITIATION OF INTRA-ARTERIAL THROMBOLYSIS  Date:  9/11/20159/12/2013 11:56 am  Radiologist:  M. Ruel Favors, MD  Guidance:  Ultrasound fluoroscopic  FLUOROSCOPY TIME:  3 min 12 seconds  MEDICATIONS AND MEDICAL HISTORY: 1 mg Versed, 25 mcg fentanyl  ANESTHESIA/SEDATION: 30 min  CONTRAST:  20mL VISIPAQUE IODIXANOL 320 MG/ML IV SOLN  COMPLICATIONS: No immediate  PROCEDURE: Informed consent was obtained from the patient following explanation of the procedure, risks, benefits and alternatives. The  patient understands, agrees and consents for the procedure. All questions were addressed. A time out was performed.  Maximal barrier sterile technique utilized including caps, mask, sterile gowns, sterile gloves, large sterile drape, hand hygiene, and Betadine.  Under sterile conditions and local anesthesia, ultrasound micropuncture access was performed of the left femoral artery in antegrade fashion. Guidewire inserted followed by 4 [place] dilator. Six French sheath inserted. Contrast injection performed for left lower extremity angiogram. Left common femoral, profunda femoral, and superficial femoral arteries are patent proximally. Left distal SFA occlusion noted above the occluded popliteal stent.  Catheter and guidewire access were manipulated through the left distal SFA and popliteal occlusion. Catheter was advanced below the knee into the left posterior tibial artery. Injection of the left posterior tibial artery demonstrates intra-arterial position. Thrombus also present throughout the left posterior tibial artery and the adjacent small branches.  Guidewire inserted to advance a 90 cm Unifuse catheter across the occluded segment with a 40 cm infusion length. Images obtained for documentation. Trans arterial catheter thrombolysis will be initiated with TNK 0.5 milligrams/hour.  IMPRESSION: Left distal SFA and stented popliteal arterial ooclusion extending into the tibial vessels.  Successful catheter access across the occluded segment to begin intra-arterial transcatheter thrombolysis.   Electronically Signed   By: Sharen Counter.D.  On: 10/23/2013 12:40   Ir Rande Lawman F/u Eval Art/ven Final Day (ms)  10/24/2013   CLINICAL DATA:  Left Popliteal aneurysm, post stent graft placement in 2011. Stent occlusion. Now post overnight catheter directed thrombolytic infusion. The infusion was terminated at approximately 6:45 a.m. because of a large left thigh hematoma.  EXAM: LEFT LOWER EXTREMITY ARTERIOGRAM THROUGH  EXISTING CATHETER  TECHNIQUE: Contrast injected through the previously placed antegrade left femoral sheaths for left lower extremity arteriography. The infusion catheter and wire were removed. The sheath was left in place at the patient returned to the ICU.  FINDINGS: SFA is patent with mild atheromatous irregularity, no stenosis. Long segment popliteal artery occlusion from above the stent graft. Small geniculate collaterals cross the knee, reconstituting only a short segment of a proximal calf tibial vessel. No named arteries are identified below the midcalf level.  IMPRESSION: 1. Persistent occlusion of the popliteal artery without significant collateral reconstitution distally. Critical Value/emergent results were called by telephone at the time of interpretation on 10/24/2013 to Dr. Imogene Burn who verbally acknowledged these results.   Electronically Signed   By: Oley Balm M.D.   On: 10/24/2013 11:09   Anti-infectives: Anti-infectives   Start     Dose/Rate Route Frequency Ordered Stop   10/31/13 0000  ceFAZolin (ANCEF) IVPB 1 g/50 mL premix  Status:  Discontinued    Comments:  Send with pt to OR   1 g 100 mL/hr over 30 Minutes Intravenous On call 10/30/13 0838 10/30/13 0842   10/31/13 0000  ceFAZolin (ANCEF) IVPB 2 g/50 mL premix  Status:  Discontinued    Comments:  Send with pt to OR   2 g 100 mL/hr over 30 Minutes Intravenous On call 10/30/13 0843 10/30/13 1614   10/30/13 0200  [MAR Hold]  cefUROXime (ZINACEF) 1.5 g in dextrose 5 % 50 mL IVPB     (On MAR Hold since 10/30/13 0917)   1.5 g 100 mL/hr over 30 Minutes Intravenous Every 12 hours 10/29/13 2150 10/30/13 1000   10/29/13 0000  ceFAZolin (ANCEF) IVPB 1 g/50 mL premix  Status:  Discontinued    Comments:  Send with pt to OR   1 g 100 mL/hr over 30 Minutes Intravenous On call 10/28/13 0805 10/28/13 1442   10/29/13 0000  ceFAZolin (ANCEF) IVPB 2 g/50 mL premix    Comments:  Send with pt to OR   2 g 100 mL/hr over 30 Minutes  Intravenous On call 10/28/13 1442 10/29/13 0125   10/27/13 2200  vancomycin (VANCOCIN) 1,750 mg in sodium chloride 0.9 % 500 mL IVPB     1,750 mg 250 mL/hr over 120 Minutes Intravenous Every 12 hours 10/27/13 1141     10/25/13 2200  vancomycin (VANCOCIN) 1,250 mg in sodium chloride 0.9 % 250 mL IVPB  Status:  Discontinued     1,250 mg 166.7 mL/hr over 90 Minutes Intravenous Every 12 hours 10/25/13 0958 10/27/13 1141   10/25/13 1400  piperacillin-tazobactam (ZOSYN) IVPB 3.375 g     3.375 g 12.5 mL/hr over 240 Minutes Intravenous 3 times per day 10/25/13 0958     10/25/13 1000  vancomycin (VANCOCIN) 1,750 mg in sodium chloride 0.9 % 500 mL IVPB     1,750 mg 250 mL/hr over 120 Minutes Intravenous  Once 10/25/13 0958 10/25/13 1433   10/25/13 1000  piperacillin-tazobactam (ZOSYN) IVPB 3.375 g  Status:  Discontinued     3.375 g 100 mL/hr over 30 Minutes Intravenous  Once 10/25/13 0958 10/25/13 1235  Assessment/Plan: s/p * No procedures listed * Stable. Transferred to 2 W. telemetry. Mobilize. DC Foley and JP drain   LOS: 8 days   [name] [name] 10/31/2013, 8:33 AM

## 2013-10-31 NOTE — Progress Notes (Signed)
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 Pt refuses the flomax; he believes it caused him to have the detached retina and don't want that to happen to his other eye. Pt said he take herbal medication at home. External condom cath placed on pt. Arabella Merles Adlee Paar RN.

## 2013-10-31 NOTE — Evaluation (Signed)
Physical Therapy Evaluation Patient Details Name: [name] [name] MRN: [id number] DOB: 05/12/1945 Today's Date: 10/31/2013   History of Present Illness  Patient s/p placement of thrombolytic catheter L leg with TNKase and heparin infusiing on 10/23/13. Pt with sudden left groin hematoma formation and pallor and shock on 9/12. Pt intubated. Pt extubated 9/14. Pt underwent Left femoral to posterior tibial bypass graft with ipsilateral non-reversed translocated greater saphenous vein and thrombectomy of left posterior tibial artery on 9/17. On 9/18 pt taken back to surgery emergently for occluded left femoral posterior tibial bypass and underwent thrombectomy and administration of nitroglycerin and TPA .Pt with history of AAA, CAD, MI, HTN.  Clinical Impression  Pt admitted with above. Pt currently with functional limitations due to the deficits listed below (see PT Problem List).  Pt will benefit from skilled PT to increase their independence and safety with mobility to allow discharge home if family can provide 24 hour assist.     Follow Up Recommendations Home health PT;Supervision/Assistance - 24 hour    Equipment Recommendations  None recommended by PT    Recommendations for Other Services       Precautions / Restrictions Precautions Precautions: Fall      Mobility  Bed Mobility Overal bed mobility: Needs Assistance Bed Mobility: Sit to Supine       Sit to supine: +2 for physical assistance;Min assist   General bed mobility comments: Assist to lower trunk and bring feet up into bed.  Transfers Overall transfer level: Needs assistance Equipment used: Rolling walker (2 wheeled) Transfers: Sit to/from Stand Sit to Stand: +2 physical assistance;Mod assist         General transfer comment: Assist to bring hips up and to extend trunk. Verbal cues for pt to straighten rt leg.  Ambulation/Gait Ambulation/Gait assistance: +2 physical assistance;Min assist Ambulation  Distance (Feet): 20 Feet Assistive device: Rolling walker (2 wheeled) Gait Pattern/deviations: Step-through pattern;Decreased step length - right;Decreased step length - left;Trunk flexed;Leaning posteriorly Gait velocity: slow Gait velocity interpretation: Below normal speed for age/gender General Gait Details: Frequent verbal cues no to step past front wheels of walker causing posterior lean. Pt tremulous and unsteady. Verbal cues to stand more erect.  Stairs            Wheelchair Mobility    Modified Rankin (Stroke Patients Only)       Balance Overall balance assessment: Needs assistance Sitting-balance support: No upper extremity supported;Feet supported Sitting balance-Leahy Scale: Fair     Standing balance support: Bilateral upper extremity supported Standing balance-Leahy Scale: Poor Standing balance comment: Pt requires wallker and 2 person assist to maintain standing. Pt with posterior lean.                             Pertinent Vitals/Pain Pain Assessment: No/denies pain (Reports numb feeling in lt leg)    Home Living Family/patient expects to be discharged to:: Private residence Living Arrangements: Spouse/significant other;Children Available Help at Discharge: Family;Available 24 hours/day Type of Home: House       Home Layout: Two level;Able to live on main level with bedroom/bathroom Home Equipment: Dan Humphreys - 2 wheels      Prior Function Level of Independence: Independent               Hand Dominance        Extremity/Trunk Assessment   Upper Extremity Assessment: Defer to OT evaluation  Lower Extremity Assessment: Generalized weakness;LLE deficits/detail   LLE Deficits / Details: grossly 3-/5     Communication   Communication: No difficulties  Cognition Arousal/Alertness: Awake/alert Behavior During Therapy: WFL for tasks assessed/performed Overall Cognitive Status: Within Functional Limits for tasks  assessed                      General Comments      Exercises        Assessment/Plan    PT Assessment Patient needs continued PT services  PT Diagnosis Difficulty walking;Generalized weakness   PT Problem List Decreased strength;Decreased activity tolerance;Decreased balance;Decreased mobility;Decreased knowledge of use of DME;Decreased knowledge of precautions;Decreased safety awareness  PT Treatment Interventions DME instruction;Gait training;Stair training;Patient/family education;Functional mobility training;Therapeutic activities;Therapeutic exercise;Balance training   PT Goals (Current goals can be found in the Care Plan section) Acute Rehab PT Goals Patient Stated Goal: Return home PT Goal Formulation: With patient Time For Goal Achievement: 11/07/13 Potential to Achieve Goals: Good    Frequency Min 3X/week   Barriers to discharge        Co-evaluation               End of Session Equipment Utilized During Treatment: Gait belt Activity Tolerance: Patient limited by fatigue Patient left: in bed;with nursing/sitter in room Nurse Communication: Mobility status         Time: 0921-0953 PT Time Calculation (min): 32 min   Charges:   PT Evaluation $Initial PT Evaluation Tier I: 1 Procedure PT Treatments $Gait Training: 23-37 mins   PT G Codes:          Kerron Sedano 2013/11/29, 10:43 AM  Skip Mayer PT [phone]

## 2013-10-31 NOTE — Progress Notes (Signed)
Pt voiding since removal or catheter but pt noted to have incontinent episodes; pt reported taking medication at home; MD called and new order received for Flomax daily. Arabella Merles Minela [place] RN.

## 2013-10-31 NOTE — Progress Notes (Signed)
Report received from 2S RN at [phone] and pt arrived to the floor at 1010. Pt A&O x4; pt oriented to the floor and unit; IV's intact and infusing; pt incision dsg intact with no active bleeding; pt in bed with family at side comfortably; pt voiding with not problem. Will continue to monitor quietly; call light within reach. Arabella Merles Greenleigh Kauth RN.

## 2013-11-01 LAB — APTT: APTT: 35 s (ref 24–37)

## 2013-11-01 MED ORDER — SAW PALMETTO 450 MG PO CAPS
900.0000 mg | ORAL_CAPSULE | Freq: Two times a day (BID) | ORAL | Status: DC
  Administered 2013-11-03 – 2013-11-16 (×26): 900 mg via ORAL
  Filled 2013-11-01 (×15): qty 2

## 2013-11-01 MED ORDER — ZOLPIDEM TARTRATE 5 MG PO TABS
5.0000 mg | ORAL_TABLET | Freq: Every evening | ORAL | Status: DC | PRN
  Administered 2013-11-01 – 2013-11-13 (×3): 5 mg via ORAL
  Filled 2013-11-01 (×4): qty 1

## 2013-11-01 MED ORDER — OMEGA-3-ACID ETHYL ESTERS 1 G PO CAPS
3.0000 g | ORAL_CAPSULE | Freq: Two times a day (BID) | ORAL | Status: DC
  Administered 2013-11-01 – 2013-11-16 (×31): 3 g via ORAL
  Filled 2013-11-01 (×34): qty 3

## 2013-11-01 NOTE — Progress Notes (Signed)
Subjective: Interval History: none.. comfortable up in chair on 2 W. Had difficulty with some urinary incontinence. Has history of BPH. Has condom catheter. Was placed on Flomax. He is concerned regarding resuming his fish oil and saw palmetto. I discussed this with pharmacy who will coordinate resuming this.  Objective: Vital signs in last 24 hours: Temp:  [98.2 F (36.8 C)-99.7 F (37.6 C)] 98.7 F (37.1 C) (09/20 0415) Pulse Rate:  [69-77] 69 (09/20 0415) Resp:  [18-19] 18 (09/20 0415) BP: (115-129)/(55-64) 129/64 mmHg (09/20 0415) SpO2:  [93 %-97 %] 93 % (09/20 0415)  Intake/Output from previous day: 09/19 0701 - 09/20 0700 In: 2088 [P.O.:1340; I.V.:128; IV Piggyback:600] Out: 2435 [Urine:2435] Intake/Output this shift:    Marked bruising and his thighs scrotum posterior thigh and calf. No hematoma. Incisions all healing. No palpable pedal pulses. Does have monophasic Doppler flow in his posterior tibial and dorsalis pedis.  Lab Results:  Recent Labs  10/30/13 1825 10/31/13 0104  WBC 9.6 9.7  HGB 9.4* 8.8*  HCT 26.9* 25.4*  PLT 220 216   BMET  Recent Labs  10/30/13 0239 10/31/13 0104  NA 142 143  K 4.2 4.0  CL 107 110  CO2 24 23  GLUCOSE 110* 107*  BUN 16 17  CREATININE 0.87 0.95  CALCIUM 8.7 8.3*    Studies/Results: Ct Head Wo Contrast  10/24/2013   CLINICAL DATA:  Altered mental status  EXAM: CT HEAD WITHOUT CONTRAST  TECHNIQUE: Contiguous axial images were obtained from the base of the skull through the vertex without intravenous contrast.  COMPARISON:  None.  FINDINGS: Mild to moderate age-related cortical atrophy. No evidence of hemorrhage or extra-axial fluid. No evidence of infarct or mass. No hydrocephalus. Calvarium intact. No significant inflammatory change in the visualized portions of the paranasal sinuses.  IMPRESSION: No acute intracranial abnormalities.   Electronically Signed   By: Esperanza Heir M.D.   On: 10/24/2013 10:09   Ir Angiogram  Extremity Left  10/23/2013   CLINICAL DATA:  Occluded left popliteal stented aneurysm, rest pain, peripheral vascular disease  EXAM: ULTRASOUND GUIDANCE FOR VASCULAR ACCESS  LEFT LOWER EXTREMITY ANGIOGRAM  SELECTIVE LEFT POSTERIOR TIBIAL PERIPHERAL ANGIOGRAM  INSERTION OF A 5 FRENCH INFUSION CATHETER ACROSS THE LEFT POPLITEAL OCCLUSION FOR INITIATION OF INTRA-ARTERIAL THROMBOLYSIS  Date:  9/11/20159/12/2013 11:56 am  Radiologist:  M. Ruel Favors, MD  Guidance:  Ultrasound fluoroscopic  FLUOROSCOPY TIME:  3 min 12 seconds  MEDICATIONS AND MEDICAL HISTORY: 1 mg Versed, 25 mcg fentanyl  ANESTHESIA/SEDATION: 30 min  CONTRAST:  20mL VISIPAQUE IODIXANOL 320 MG/ML IV SOLN  COMPLICATIONS: No immediate  PROCEDURE: Informed consent was obtained from the patient following explanation of the procedure, risks, benefits and alternatives. The patient understands, agrees and consents for the procedure. All questions were addressed. A time out was performed.  Maximal barrier sterile technique utilized including caps, mask, sterile gowns, sterile gloves, large sterile drape, hand hygiene, and Betadine.  Under sterile conditions and local anesthesia, ultrasound micropuncture access was performed of the left femoral artery in antegrade fashion. Guidewire inserted followed by 4 [place] dilator. Six French sheath inserted. Contrast injection performed for left lower extremity angiogram. Left common femoral, profunda femoral, and superficial femoral arteries are patent proximally. Left distal SFA occlusion noted above the occluded popliteal stent.  Catheter and guidewire access were manipulated through the left distal SFA and popliteal occlusion. Catheter was advanced below the knee into the left posterior tibial artery. Injection of the left posterior tibial artery demonstrates  intra-arterial position. Thrombus also present throughout the left posterior tibial artery and the adjacent small branches.  Guidewire inserted to advance a 90  cm Unifuse catheter across the occluded segment with a 40 cm infusion length. Images obtained for documentation. Trans arterial catheter thrombolysis will be initiated with TNK 0.5 milligrams/hour.  IMPRESSION: Left distal SFA and stented popliteal arterial ooclusion extending into the tibial vessels.  Successful catheter access across the occluded segment to begin intra-arterial transcatheter thrombolysis.   Electronically Signed   By: Ruel Favors M.D.   On: 10/23/2013 12:40   Ir Angiogram Selective Each Additional Vessel  10/23/2013   CLINICAL DATA:  Occluded left popliteal stented aneurysm, rest pain, peripheral vascular disease  EXAM: ULTRASOUND GUIDANCE FOR VASCULAR ACCESS  LEFT LOWER EXTREMITY ANGIOGRAM  SELECTIVE LEFT POSTERIOR TIBIAL PERIPHERAL ANGIOGRAM  INSERTION OF A 5 FRENCH INFUSION CATHETER ACROSS THE LEFT POPLITEAL OCCLUSION FOR INITIATION OF INTRA-ARTERIAL THROMBOLYSIS  Date:  9/11/20159/12/2013 11:56 am  Radiologist:  M. Ruel Favors, MD  Guidance:  Ultrasound fluoroscopic  FLUOROSCOPY TIME:  3 min 12 seconds  MEDICATIONS AND MEDICAL HISTORY: 1 mg Versed, 25 mcg fentanyl  ANESTHESIA/SEDATION: 30 min  CONTRAST:  20mL VISIPAQUE IODIXANOL 320 MG/ML IV SOLN  COMPLICATIONS: No immediate  PROCEDURE: Informed consent was obtained from the patient following explanation of the procedure, risks, benefits and alternatives. The patient understands, agrees and consents for the procedure. All questions were addressed. A time out was performed.  Maximal barrier sterile technique utilized including caps, mask, sterile gowns, sterile gloves, large sterile drape, hand hygiene, and Betadine.  Under sterile conditions and local anesthesia, ultrasound micropuncture access was performed of the left femoral artery in antegrade fashion. Guidewire inserted followed by 4 [place] dilator. Six French sheath inserted. Contrast injection performed for left lower extremity angiogram. Left common femoral, profunda femoral, and  superficial femoral arteries are patent proximally. Left distal SFA occlusion noted above the occluded popliteal stent.  Catheter and guidewire access were manipulated through the left distal SFA and popliteal occlusion. Catheter was advanced below the knee into the left posterior tibial artery. Injection of the left posterior tibial artery demonstrates intra-arterial position. Thrombus also present throughout the left posterior tibial artery and the adjacent small branches.  Guidewire inserted to advance a 90 cm Unifuse catheter across the occluded segment with a 40 cm infusion length. Images obtained for documentation. Trans arterial catheter thrombolysis will be initiated with TNK 0.5 milligrams/hour.  IMPRESSION: Left distal SFA and stented popliteal arterial ooclusion extending into the tibial vessels.  Successful catheter access across the occluded segment to begin intra-arterial transcatheter thrombolysis.   Electronically Signed   By: Ruel Favors M.D.   On: 10/23/2013 12:40   Ir US Guide Vasc Access Left  10/23/2013   CLINICAL DATA:  Occluded left popliteal stented aneurysm, rest pain, peripheral vascular disease  EXAM: ULTRASOUND GUIDANCE FOR VASCULAR ACCESS  LEFT LOWER EXTREMITY ANGIOGRAM  SELECTIVE LEFT POSTERIOR TIBIAL PERIPHERAL ANGIOGRAM  INSERTION OF A 5 FRENCH INFUSION CATHETER ACROSS THE LEFT POPLITEAL OCCLUSION FOR INITIATION OF INTRA-ARTERIAL THROMBOLYSIS  Date:  9/11/20159/12/2013 11:56 am  Radiologist:  M. Ruel Favors, MD  Guidance:  Ultrasound fluoroscopic  FLUOROSCOPY TIME:  3 min 12 seconds  MEDICATIONS AND MEDICAL HISTORY: 1 mg Versed, 25 mcg fentanyl  ANESTHESIA/SEDATION: 30 min  CONTRAST:  20mL VISIPAQUE IODIXANOL 320 MG/ML IV SOLN  COMPLICATIONS: No immediate  PROCEDURE: Informed consent was obtained from the patient following explanation of the procedure, risks, benefits and alternatives. The patient understands, agrees  and consents for the procedure. All questions were addressed.  A time out was performed.  Maximal barrier sterile technique utilized including caps, mask, sterile gowns, sterile gloves, large sterile drape, hand hygiene, and Betadine.  Under sterile conditions and local anesthesia, ultrasound micropuncture access was performed of the left femoral artery in antegrade fashion. Guidewire inserted followed by 4 [place] dilator. Six French sheath inserted. Contrast injection performed for left lower extremity angiogram. Left common femoral, profunda femoral, and superficial femoral arteries are patent proximally. Left distal SFA occlusion noted above the occluded popliteal stent.  Catheter and guidewire access were manipulated through the left distal SFA and popliteal occlusion. Catheter was advanced below the knee into the left posterior tibial artery. Injection of the left posterior tibial artery demonstrates intra-arterial position. Thrombus also present throughout the left posterior tibial artery and the adjacent small branches.  Guidewire inserted to advance a 90 cm Unifuse catheter across the occluded segment with a 40 cm infusion length. Images obtained for documentation. Trans arterial catheter thrombolysis will be initiated with TNK 0.5 milligrams/hour.  IMPRESSION: Left distal SFA and stented popliteal arterial ooclusion extending into the tibial vessels.  Successful catheter access across the occluded segment to begin intra-arterial transcatheter thrombolysis.   Electronically Signed   By: Ruel Favors M.D.   On: 10/23/2013 12:40   Dg Chest Port 1 View  10/26/2013   CLINICAL DATA:  Followup respiratory failure  EXAM: PORTABLE CHEST - 1 VIEW  COMPARISON:  10/25/2013  FINDINGS: Cardiomediastinal silhouette is stable. Stable endotracheal and NG tube position. Worsening atelectasis or infiltrate in right middle lobe. Question small right pleural effusion. Left lung is clear. No pulmonary edema.  IMPRESSION: Worsening atelectasis or infiltrate in right middle lobe. Stable  support apparatus. Question small right pleural effusion.   Electronically Signed   By: Natasha Mead M.D.   On: 10/26/2013 08:11   Dg Chest Port 1 View  10/25/2013   CLINICAL DATA:  Evaluate endotracheal tube placement.  EXAM: PORTABLE CHEST - 1 VIEW  COMPARISON:  Chest x-ray 10/24/2013.  FINDINGS: An endotracheal tube is in place with tip 7.5 cm above the carina. Transcutaneous defibrillator pads projecting over the mid and lower left hemithorax. Lung volumes are low, with probable partial atelectasis of the right middle lobe. No acute consolidative airspace disease. No pleural effusions. No evidence of pulmonary edema. Heart size and mediastinal contours are within normal limits.  IMPRESSION: 1. Support apparatus, as above. 2. Low lung volumes with partial atelectasis of the right middle lobe.   Electronically Signed   By: Trudie Reed M.D.   On: 10/25/2013 09:25   Dg Chest Port 1 View  10/24/2013   CLINICAL DATA:  Recheck endotracheal tube placement  EXAM: PORTABLE CHEST - 1 VIEW  COMPARISON:  10/24/2013 at 0722 hr  FINDINGS: Endotracheal tube unchanged with tip about 4.3 cm above the carina. Limited inspiratory effect. Lungs clear.  IMPRESSION: No change in position of endotracheal tube   Electronically Signed   By: Esperanza Heir M.D.   On: 10/24/2013 10:37   Dg Chest Port 1v Same Day  10/24/2013   CLINICAL DATA:  Hypotension.  EXAM: PORTABLE CHEST - 1 VIEW SAME DAY  COMPARISON:  11/24/2009  FINDINGS: Endotracheal tube is in good position. Heart size and pulmonary vascularity are normal and the lungs are clear. No osseous abnormality. No pneumothorax.  IMPRESSION: Endotracheal tube in good position.  Lungs are clear.   Electronically Signed   By: Violeta Gelinas.D.  On: 10/24/2013 08:06   Dg Ang/ext/uni/or Left  10/30/2013   CLINICAL DATA:  68 year old male with left lower extremity angiogram.  EXAM: Intraoperative fluoroscopic images of the left lower extremity. Three angiogram series are  included.  Date:  9/18/20159/18/2015 2:17 pm  FLUOROSCOPY TIME:  1 min 24 seconds  MEDICATIONS AND MEDICAL HISTORY: Atherosclerosis  Peripheral vascular disease  COMPLICATIONS: NONE IMMEDIATELY APPARENT  ANESTHESIA/SEDATION: MANAGED IN THE OPERATING ROOM.  PROCEDURE: INTRAOPERATIVE ANGIOGRAM.  FINDINGS: Three angiographic series are included of the left lower extremity. The series of 2 C and 7 CE represent a pre and post treatment of the same artery of the left lower extremity. It is uncertain the location given the lack of the anatomic landmarks. There is a at least 90% stenosis of the artery before treatment, with resolution of this stenosis on imaged 7 C. the angiographic post treatment image 7c demonstrates no residual stenosis though there is irregularity in the artery which appears to be related to a bypass graft.  Image 8c demonstrates a lateral view of the foot with atherosclerotic changes of the posterior tibial artery. There is occlusion of the common plantar artery with no filling of the plantar arch. Multiple collateral vessels of the foot. No visualized anterior tibial artery.  IMPRESSION:  Pre and post treatment intraoperative fluoroscopic angiogram demonstrates improved stenosis of the treated artery of the left lower leg.  Lateral foot angiogram demonstrates occlusion of the posterior tibial artery at the level of the common plantar artery.   Electronically Signed   By: Gilmer Mor O.D.   On: 10/30/2013 17:43   Dg Abd Portable 1v  10/25/2013   CLINICAL DATA:  Orogastric tube placement.  EXAM: PORTABLE ABDOMEN - 1 VIEW  COMPARISON:  12/29/2012.  FINDINGS: The distal end of an orogastric tube is noted with tip in the mid body of the stomach. Visualized bowel gas pattern is nonobstructive. And aorta bi-iliac stent graft is noted. Defibrillator pad projecting over the left side of the abdomen.  IMPRESSION: 1. Tip of orogastric tube is in the mid body of the stomach.   Electronically Signed   By:  Trudie Reed M.D.   On: 10/25/2013 15:19   Ct Angio Abd/pel W/ And/or W/o  10/24/2013   CLINICAL DATA:  hemoharge in setting TPA use, possible enlarging AAA  EXAM: CT ANGIOGRAPHY ABDOMEN AND PELVIS  TECHNIQUE: Multidetector CT imaging of the abdomen and pelvis was performed using the standard protocol during bolus administration of intravenous contrast. Multiplanar reconstructed images including MIPs were obtained and reviewed to evaluate the vascular anatomy.  CONTRAST:  80mL OMNIPAQUE IOHEXOL 350 MG/ML SOLN  COMPARISON:  11/21/2012 and earlier studies  FINDINGS: ARTERIAL FINDINGS:  Aorta: Visualized distal descending thoracic and suprarenal segments unremarkable. Patent bifurcated infrarenal aortic stent graft. No evidence of endoleak. Native aneurysm diameter 5.6 cm on image 133/601, previously 5.7 cm by my measurement.  Celiac axis:          Patent  Superior mesenteric:  Patent, with classic distal branch anatomy.  Left renal:           Single, patent.  Right renal:          Duplicated, inferior dominant, both patent.  Inferior mesenteric:  Origin occlusion.  Left iliac: Left limb of the stent graft extends to the mid common iliac artery. Mild scattered plaque in the distal native common iliac artery and internal iliac artery. External iliac is widely patent. Common femoral artery is widely patent. There is a  9 mm pseudoaneurysm from the distal common femoral. Profunda femoris branches patent. Visualized proximal SFA patent. Antegrade arterial sheath is in the proximal SFA.  Right iliac: Right limb of the stent graft extends to the distal common iliac artery. Minimal plaque in the native internal iliac artery which is mildly ectatic -8 mm diameter. External iliac and common femoral artery patent. Visualized portions of proximal profunda femoris and SFA patent.  Venous findings:      Venous phase not obtained.  Review of the MIP images confirms the above findings.  Nonvascular findings: Minimal dependent  atelectasis posteriorly in the visualized lung bases. Partially calcified subcentimeter stone in the dependent aspect of the nondilated gallbladder. 16 mm cyst in hepatic segment 3. Unremarkable spleen, adrenal glands, pancreas. Stable hepatic cysts. No hydronephrosis. Stomach, small bowel, and colon are nondilated.Normal appendix. A few scattered sigmoid diverticula without adjacent inflammatory/edematous change. Urinary bladder is partially decompressed by Foley catheter. Marked prostatic enlargement with central coarse calcifications. Minimal spondylitic changes in the visualized lower thoracic and lumbar spine.  There is no retroperitoneal or pelvic hematoma. There is a large amount of probable blood throughout the subcutaneous tissues of the anterior right thigh.  IMPRESSION: 1. Patent infrarenal bifurcated aortic stent graft without endoleak. Slight decrease in native aneurysm sac diameter, 5.6 cm. 2. 9mm pseudoaneurysm from left common femoral artery with large anterior thigh hematoma. 3. No evidence of retroperitoneal or pelvic hematoma. Critical Value/emergent results were called by telephone at the time of interpretation on 10/24/2013 at 11:02 am to Dr. Imogene Burn, who verbally acknowledged these results.  4. Cholelithiasis 5. Marked prostatic enlargement.   Electronically Signed   By: Oley Balm M.D.   On: 10/24/2013 11:02   Ir Infusion Thrombol Arterial Initial (ms)  10/23/2013   CLINICAL DATA:  Occluded left popliteal stented aneurysm, rest pain, peripheral vascular disease  EXAM: ULTRASOUND GUIDANCE FOR VASCULAR ACCESS  LEFT LOWER EXTREMITY ANGIOGRAM  SELECTIVE LEFT POSTERIOR TIBIAL PERIPHERAL ANGIOGRAM  INSERTION OF A 5 FRENCH INFUSION CATHETER ACROSS THE LEFT POPLITEAL OCCLUSION FOR INITIATION OF INTRA-ARTERIAL THROMBOLYSIS  Date:  9/11/20159/12/2013 11:56 am  Radiologist:  M. Ruel Favors, MD  Guidance:  Ultrasound fluoroscopic  FLUOROSCOPY TIME:  3 min 12 seconds  MEDICATIONS AND MEDICAL HISTORY:  1 mg Versed, 25 mcg fentanyl  ANESTHESIA/SEDATION: 30 min  CONTRAST:  20mL VISIPAQUE IODIXANOL 320 MG/ML IV SOLN  COMPLICATIONS: No immediate  PROCEDURE: Informed consent was obtained from the patient following explanation of the procedure, risks, benefits and alternatives. The patient understands, agrees and consents for the procedure. All questions were addressed. A time out was performed.  Maximal barrier sterile technique utilized including caps, mask, sterile gowns, sterile gloves, large sterile drape, hand hygiene, and Betadine.  Under sterile conditions and local anesthesia, ultrasound micropuncture access was performed of the left femoral artery in antegrade fashion. Guidewire inserted followed by 4 [place] dilator. Six French sheath inserted. Contrast injection performed for left lower extremity angiogram. Left common femoral, profunda femoral, and superficial femoral arteries are patent proximally. Left distal SFA occlusion noted above the occluded popliteal stent.  Catheter and guidewire access were manipulated through the left distal SFA and popliteal occlusion. Catheter was advanced below the knee into the left posterior tibial artery. Injection of the left posterior tibial artery demonstrates intra-arterial position. Thrombus also present throughout the left posterior tibial artery and the adjacent small branches.  Guidewire inserted to advance a 90 cm Unifuse catheter across the occluded segment with a 40 cm infusion length. Images obtained  for documentation. Trans arterial catheter thrombolysis will be initiated with TNK 0.5 milligrams/hour.  IMPRESSION: Left distal SFA and stented popliteal arterial ooclusion extending into the tibial vessels.  Successful catheter access across the occluded segment to begin intra-arterial transcatheter thrombolysis.   Electronically Signed   By: Ruel Favors M.D.   On: 10/23/2013 12:40   Ir Rande Lawman F/u Eval Art/ven Final Day (ms)  10/24/2013   CLINICAL DATA:  Left  Popliteal aneurysm, post stent graft placement in 2011. Stent occlusion. Now post overnight catheter directed thrombolytic infusion. The infusion was terminated at approximately 6:45 a.m. because of a large left thigh hematoma.  EXAM: LEFT LOWER EXTREMITY ARTERIOGRAM THROUGH EXISTING CATHETER  TECHNIQUE: Contrast injected through the previously placed antegrade left femoral sheaths for left lower extremity arteriography. The infusion catheter and wire were removed. The sheath was left in place at the patient returned to the ICU.  FINDINGS: SFA is patent with mild atheromatous irregularity, no stenosis. Long segment popliteal artery occlusion from above the stent graft. Small geniculate collaterals cross the knee, reconstituting only a short segment of a proximal calf tibial vessel. No named arteries are identified below the midcalf level.  IMPRESSION: 1. Persistent occlusion of the popliteal artery without significant collateral reconstitution distally. Critical Value/emergent results were called by telephone at the time of interpretation on 10/24/2013 to Dr. Imogene Burn who verbally acknowledged these results.   Electronically Signed   By: Oley Balm M.D.   On: 10/24/2013 11:09   Anti-infectives: Anti-infectives   Start     Dose/Rate Route Frequency Ordered Stop   10/31/13 0000  ceFAZolin (ANCEF) IVPB 1 g/50 mL premix  Status:  Discontinued    Comments:  Send with pt to OR   1 g 100 mL/hr over 30 Minutes Intravenous On call 10/30/13 0838 10/30/13 0842   10/31/13 0000  ceFAZolin (ANCEF) IVPB 2 g/50 mL premix  Status:  Discontinued    Comments:  Send with pt to OR   2 g 100 mL/hr over 30 Minutes Intravenous On call 10/30/13 0843 10/30/13 1614   10/30/13 0200  [MAR Hold]  cefUROXime (ZINACEF) 1.5 g in dextrose 5 % 50 mL IVPB     (On MAR Hold since 10/30/13 0917)   1.5 g 100 mL/hr over 30 Minutes Intravenous Every 12 hours 10/29/13 2150 10/30/13 1000   10/29/13 0000  ceFAZolin (ANCEF) IVPB 1 g/50 mL premix   Status:  Discontinued    Comments:  Send with pt to OR   1 g 100 mL/hr over 30 Minutes Intravenous On call 10/28/13 0805 10/28/13 1442   10/29/13 0000  ceFAZolin (ANCEF) IVPB 2 g/50 mL premix    Comments:  Send with pt to OR   2 g 100 mL/hr over 30 Minutes Intravenous On call 10/28/13 1442 10/29/13 0125   10/27/13 2200  vancomycin (VANCOCIN) 1,750 mg in sodium chloride 0.9 % 500 mL IVPB     1,750 mg 250 mL/hr over 120 Minutes Intravenous Every 12 hours 10/27/13 1141     10/25/13 2200  vancomycin (VANCOCIN) 1,250 mg in sodium chloride 0.9 % 250 mL IVPB  Status:  Discontinued     1,250 mg 166.7 mL/hr over 90 Minutes Intravenous Every 12 hours 10/25/13 0958 10/27/13 1141   10/25/13 1400  piperacillin-tazobactam (ZOSYN) IVPB 3.375 g     3.375 g 12.5 mL/hr over 240 Minutes Intravenous 3 times per day 10/25/13 0958     10/25/13 1000  vancomycin (VANCOCIN) 1,750 mg in sodium chloride 0.9 %  500 mL IVPB     1,750 mg 250 mL/hr over 120 Minutes Intravenous  Once 10/25/13 0958 10/25/13 1433   10/25/13 1000  piperacillin-tazobactam (ZOSYN) IVPB 3.375 g  Status:  Discontinued     3.375 g 100 mL/hr over 30 Minutes Intravenous  Once 10/25/13 0958 10/25/13 1235      Assessment/Plan: s/p * No procedures listed * Stable. Continue to mobilize. Continues to have low and this left foot but certainly not biphasic in the posterior tibial. Continue to watch. No evidence of critical ischemia.   LOS: 9 days   Kobey Sides 11/01/2013, 10:45 AM

## 2013-11-01 NOTE — Progress Notes (Signed)
Physical Therapy Treatment Patient Details Name: [name] [name] MRN: [id number] DOB: Mar 28, 1945 Today's Date: 11/01/2013    History of Present Illness Patient s/p placement of thrombolytic catheter L leg with TNKase and heparin infusiing on 10/23/13. Pt with sudden left groin hematoma formation and pallor and shock on 9/12. Pt intubated. Pt extubated 9/14. Pt underwent Left femoral to posterior tibial bypass graft with ipsilateral non-reversed translocated greater saphenous vein and thrombectomy of left posterior tibial artery on 9/17. On 9/18 pt taken back to surgery emergently for occluded left femoral posterior tibial bypass and underwent thrombectomy and administration of nitroglycerin and TPA .Pt with history of AAA, CAD, MI, HTN.    PT Comments    Making steady progress, with improvements in activity tolerance and amb distance  Follow Up Recommendations  Home health PT;Supervision/Assistance - 24 hour     Equipment Recommendations  None recommended by PT    Recommendations for Other Services       Precautions / Restrictions Precautions Precautions: Fall    Mobility  Bed Mobility Overal bed mobility: Needs Assistance Bed Mobility: Supine to Sit     Supine to sit: Min assist     General bed mobility comments: Min assist to elevate trunk, and provide support while pt came to uprihgt sitting  Transfers Overall transfer level: Needs assistance Equipment used: Rolling walker (2 wheeled) Transfers: Sit to/from Stand Sit to Stand: Mod assist         General transfer comment: Cues for hand placement and technique; light mod anti gravity assist   Ambulation/Gait Ambulation/Gait assistance: Min guard Ambulation Distance (Feet): 50 Feet Assistive device: Rolling walker (2 wheeled) Gait Pattern/deviations: Step-through pattern;Decreased stride length;Trunk flexed Gait velocity: slow   General Gait Details: Continued cues for upright posture; better steps and RW  International aid/development worker    Modified Rankin (Stroke Patients Only)       Balance     Sitting balance-Leahy Scale: Fair       Standing balance-Leahy Scale: Poor                      Cognition Arousal/Alertness: Awake/alert Behavior During Therapy: WFL for tasks assessed/performed Overall Cognitive Status: Within Functional Limits for tasks assessed                      Exercises      General Comments        Pertinent Vitals/Pain Pain Assessment: 0-10 Pain Score: 2  Pain Location: LLE Pain Descriptors / Indicators: Discomfort;Sore (Stiff) Pain Intervention(s): Monitored during session    Home Living                      Prior Function            PT Goals (current goals can now be found in the care plan section) Acute Rehab PT Goals Patient Stated Goal: Return home PT Goal Formulation: With patient Time For Goal Achievement: 11/07/13 Potential to Achieve Goals: Good Progress towards PT goals: Progressing toward goals    Frequency  Min 3X/week    PT Plan Current plan remains appropriate    Co-evaluation             End of Session   Activity Tolerance: Patient tolerated treatment well Patient left: in chair;with call bell/phone within reach;with family/visitor present     Time: 1191-4782 PT  Time Calculation (min): 32 min  Charges:  $Gait Training: 23-37 mins                    G Codes:      Van Clines Hamff 11/01/2013, 10:06 AM  Van Clines, PT  Acute Rehabilitation Services Pager [phone] Office [phone]

## 2013-11-02 ENCOUNTER — Encounter (HOSPITAL_COMMUNITY): Payer: Self-pay | Admitting: Surgery

## 2013-11-02 DIAGNOSIS — Z48812 Encounter for surgical aftercare following surgery on the circulatory system: Secondary | ICD-10-CM

## 2013-11-02 LAB — APTT: aPTT: 35 seconds (ref 24–37)

## 2013-11-02 NOTE — Progress Notes (Signed)
OT Cancellation Note  Patient Details Name: [name] [name] MRN: [id number] DOB: [birth year]   Cancelled Treatment:    Reason Eval/Treat Not Completed: Patient at procedure or test/ unavailable - Pt with another provider.  Will try back.   Angelene Giovanni [place], OTR/L [phone]  11/02/2013, 10:49 AM

## 2013-11-02 NOTE — Evaluation (Signed)
Occupational Therapy Evaluation Patient Details Name: [name] [name] MRN: [id number] DOB: March 23, 1945 Today's Date: 11/02/2013    History of Present Illness Patient s/p placement of thrombolytic catheter L leg with TNKase and heparin infusiing on 10/23/13. Pt with sudden left groin hematoma formation and pallor and shock on 9/12. Pt intubated. Pt extubated 9/14. Pt underwent Left femoral to posterior tibial bypass graft with ipsilateral non-reversed translocated greater saphenous vein and thrombectomy of left posterior tibial artery on 9/17. On 9/18 pt taken back to surgery emergently for occluded left femoral posterior tibial bypass and underwent thrombectomy and administration of nitroglycerin and TPA .Pt with history of AAA, CAD, MI, HTN.   Clinical Impression   Pt admitted with above. He demonstrates the below listed deficits and will benefit from continued OT to maximize safety and independence with BADLs.  Anticipate good progress.  Currently, pt requires min guard assist with BADLs and functional mobility.  Will follow.       Follow Up Recommendations  No OT follow up;Supervision - Intermittent    Equipment Recommendations  None recommended by OT    Recommendations for Other Services       Precautions / Restrictions Precautions Precautions: Fall Restrictions Weight Bearing Restrictions: No      Mobility Bed Mobility Overal bed mobility: Needs Assistance Bed Mobility: Supine to Sit     Supine to sit: HOB elevated;Min guard        Transfers Overall transfer level: Needs assistance Equipment used: Rolling walker (2 wheeled) Transfers: Sit to/from [employer] Sit to Stand: Min guard Stand pivot transfers: Min guard       General transfer comment: cues for hand placement    Balance Overall balance assessment: Needs assistance Sitting-balance support: Feet supported;No upper extremity supported Sitting balance-Leahy Scale: Good     Standing  balance support: Single extremity supported Standing balance-Leahy Scale: Fair                              ADL Overall ADL's : Needs assistance/impaired Eating/Feeding: Independent;Sitting   Grooming: Wash/dry hands;Wash/dry face;Applying deodorant;Brushing hair;Min guard;Standing   Upper Body Bathing: Set up;Sitting   Lower Body Bathing: Min guard;Sit to/from stand   Upper Body Dressing : Set up;Sitting   Lower Body Dressing: Min guard;Sit to/from stand Lower Body Dressing Details (indicate cue type and reason): increased time and effort to access Lt. foot Toilet Transfer: Min guard;Ambulation;Comfort height toilet   Toileting- Clothing Manipulation and Hygiene: Min guard;Sit to/from stand       Functional mobility during ADLs: Min guard;Rolling walker General ADL Comments: Pt very motivated     Immunologist      Pertinent Vitals/Pain Pain Assessment: No/denies pain     Hand Dominance     Extremity/Trunk Assessment Upper Extremity Assessment Upper Extremity Assessment: Overall WFL for tasks assessed   Lower Extremity Assessment Lower Extremity Assessment: Defer to PT evaluation   Cervical / Trunk Assessment Cervical / Trunk Assessment: Normal   Communication Communication Communication: No difficulties   Cognition Arousal/Alertness: Awake/alert Behavior During Therapy: WFL for tasks assessed/performed Overall Cognitive Status: Within Functional Limits for tasks assessed                     General Comments       Exercises  Shoulder Instructions      Home Living Family/patient expects to be discharged to:: Private residence Living Arrangements: Spouse/significant other;Children Available Help at Discharge: Family;Available 24 hours/day Type of Home: House       Home Layout: Two level;Able to live on main level with bedroom/bathroom     Bathroom Shower/Tub: Multimedia programmer: Standard     Home Equipment: Walker - 2 wheels          Prior Functioning/Environment Level of Independence: Independent             OT Diagnosis: Generalized weakness   OT Problem List: Decreased strength;Impaired balance (sitting and/or standing);Decreased knowledge of use of DME or AE   OT Treatment/Interventions: Self-care/ADL training;DME and/or AE instruction;Therapeutic activities;Patient/family education;Balance training    OT Goals(Current goals can be found in the care plan section) Acute Rehab OT Goals Patient Stated Goal: Return home OT Goal Formulation: With patient/family Time For Goal Achievement: 11/16/13 Potential to Achieve Goals: Good ADL Goals Pt Will Perform Grooming: with modified independence;standing Pt Will Perform Upper Body Bathing: with modified independence;standing Pt Will Perform Lower Body Bathing: sit to/from stand;with modified independence Pt Will Perform Lower Body Dressing: with modified independence;sit to/from stand Pt Will Transfer to Toilet: with modified independence;ambulating;regular height toilet;grab bars Pt Will Perform Toileting - Clothing Manipulation and hygiene: with modified independence;sit to/from stand Pt Will Perform Tub/Shower Transfer: Tub transfer;with min guard assist;rolling walker;3 in 1  OT Frequency: Min 2X/week   Barriers to D/C:            Co-evaluation              End of Session Equipment Utilized During Treatment: Rolling walker Nurse Communication: Mobility status  Activity Tolerance: Patient tolerated treatment well Patient left: in chair;with call bell/phone within reach;with family/visitor present   Time: 1610-9604 OT Time Calculation (min): 26 min Charges:  OT General Charges $OT Visit: 1 Procedure OT Evaluation $Initial OT Evaluation Tier I: 1 Procedure OT Treatments $Therapeutic Activity: 8-22 mins G-Codes:    Laurieann Friddle M 11/16/13, 1:42  PM

## 2013-11-02 NOTE — Progress Notes (Addendum)
  Progress Note    11/02/2013 8:06 AM 10 Days Post-Op  Subjective:  Feels better--states he has walked some.  Afebrile VSS 96% RA  Filed Vitals:   11/02/13 0546  BP: 126/65  Pulse: 60  Temp: 98.6 F (37 C)  Resp: 17    Physical Exam: Lungs:  Non labored Incisions:  All are c/d/i Extremities:  No sensation on plantar surface of foot.  He does have motor function of his foot.  + doppler signal in the graft below the knee; unable to hear signals in the DP/PT  CBC    Component Value Date/Time   WBC 9.7 10/31/2013 0104   RBC 2.81* 10/31/2013 0104   HGB 8.8* 10/31/2013 0104   HCT 25.4* 10/31/2013 0104   PLT 216 10/31/2013 0104   MCV 90.4 10/31/2013 0104   MCH 31.3 10/31/2013 0104   MCHC 34.6 10/31/2013 0104   RDW 13.8 10/31/2013 0104   LYMPHSABS 1.6 10/24/2013 0635   MONOABS 1.0 10/24/2013 0635   EOSABS 0.0 10/24/2013 0635   BASOSABS 0.0 10/24/2013 0635    BMET    Component Value Date/Time   NA 143 10/31/2013 0104   K 4.0 10/31/2013 0104   CL 110 10/31/2013 0104   CO2 23 10/31/2013 0104   GLUCOSE 107* 10/31/2013 0104   BUN 17 10/31/2013 0104   CREATININE 0.95 10/31/2013 0104   CALCIUM 8.3* 10/31/2013 0104   GFRNONAA 82* 10/31/2013 0104   GFRAA >90 10/31/2013 0104    INR    Component Value Date/Time   INR 1.15 10/29/2013 0238     Intake/Output Summary (Last 24 hours) at 11/02/13 0806 Last data filed at 11/02/13 0210  Gross per 24 hour  Intake 1446.36 ml  Output   3250 ml  Net -1803.64 ml     Assessment:  68 y.o. male is s/p:  #1: Thrombectomy of left femoral to posterior tibial artery bypass graft  #2: Reexploration of the left common femoral and left posterior tibial artery  #3: Exposure of the posterior tibial artery at the ankle with vein patch angioplasty  #4: Intra-arterial administration of nitroglycerin and TPA  #5: Left lower extremity angiogram  10 Days Post-Op  Plan: -cannot obtain doppler flow in pedal pulses.-- will obtain arterial duplex of the graft  this am to determine patency. -DVT prophylaxis:  Heparin gtt   Doreatha Massed, PA-C [facility] [phone] 11/02/2013 8:06 AM   I agree with the above.  The patient was able to ambulate yesterday.  He still has discomfort in his left leg.  We were able to Doppler monophasic signals in the posterior tibial and dorsalis pedis.  I also got a more brisk signal around the distal upper thigh and 1 which correlated with the level of the popliteal artery.  The patient continues on IV heparin.  I will get a ultrasound today to assess his bypass graft patency.  Durene Cal

## 2013-11-02 NOTE — Progress Notes (Signed)
Physical Therapy Treatment Patient Details Name: [name] [name] MRN: [id number] DOB: 08/18/1945 Today's Date: 11/02/2013    History of Present Illness Patient s/p placement of thrombolytic catheter L leg with TNKase and heparin infusiing on 10/23/13. Pt with sudden left groin hematoma formation and pallor and shock on 9/12. Pt intubated. Pt extubated 9/14. Pt underwent Left femoral to posterior tibial bypass graft with ipsilateral non-reversed translocated greater saphenous vein and thrombectomy of left posterior tibial artery on 9/17. On 9/18 pt taken back to surgery emergently for occluded left femoral posterior tibial bypass and underwent thrombectomy and administration of nitroglycerin and TPA .Pt with history of AAA, CAD, MI, HTN.    PT Comments    Pt did well today.  He walked 140 feet with RW and CGA.  Pt working on step through gait pattern and gently increasing ROM of left ankle.   Pt wanting to do more walking - I educated wife on how to manage equipment (IV and foley) today and gave her OK to walk with pt if she has second family member to help her.  Nursing tech notified.  Pt even stood at sink (instructed in safe use of RW during this) and brushed his teeth and shaved.  I educated him on how to do marching or heel raises when standing to help with circulation in standing.   Follow Up Recommendations  Home health PT;Supervision/Assistance - 24 hour     Equipment Recommendations  None recommended by PT    Recommendations for Other Services       Precautions / Restrictions Precautions Precautions: Fall Restrictions Weight Bearing Restrictions: No    Mobility  Bed Mobility Overal bed mobility: Needs Assistance Bed Mobility: Supine to Sit     Supine to sit: HOB elevated;Min guard        Transfers Overall transfer level: Needs assistance Equipment used: Rolling walker (2 wheeled) Transfers: Sit to/from Stand Sit to Stand: Min guard (from elevated bed)          General transfer comment: cues for hand placement  Ambulation/Gait Ambulation/Gait assistance: Min guard Ambulation Distance (Feet): 140 Feet Assistive device: Rolling walker (2 wheeled) Gait Pattern/deviations: Step-to pattern;Decreased stance time - left;Decreased step length - right;Trunk flexed     General Gait Details: Continued cues for upright posture and for step through gait pattern.  By end of walk pt abel to step 2 inches past left foot   Stairs            Wheelchair Mobility    Modified Rankin (Stroke Patients Only)       Balance                                    Cognition Arousal/Alertness: Awake/alert Behavior During Therapy: WFL for tasks assessed/performed Overall Cognitive Status: Within Functional Limits for tasks assessed                      Exercises      General Comments General comments (skin integrity, edema, etc.): Pt able to stand at sink (Cues for RW placement) and brush teeth and shave - before sitting down      Pertinent Vitals/Pain Pain Assessment: No/denies pain    Home Living                      Prior Function  PT Goals (current goals can now be found in the care plan section) Acute Rehab PT Goals Potential to Achieve Goals: Good Progress towards PT goals: Progressing toward goals    Frequency  Min 3X/week    PT Plan Current plan remains appropriate    Co-evaluation             End of Session Equipment Utilized During Treatment: Gait belt Activity Tolerance: Patient tolerated treatment well Patient left: in chair;with call bell/phone within reach;with family/visitor present     Time: 1308-6578 PT Time Calculation (min): 31 min  Charges:  $Gait Training: 23-37 mins                    G Codes:      Judson Roch 11/02/2013, 12:35 PM 11/02/2013   Ranae Palms, PT

## 2013-11-02 NOTE — Progress Notes (Signed)
*  PRELIMINARY RESULTS* Vascular Ultrasound Left Lower Extremity Arterial Duplex has been completed.  Study was very technically difficult and limited due to extensive edema and bruising.  Unable to visualize the proximal and distal anastomoses, left common femoral artery, left and proximal femoral artery due to technical limitations. Unable to discern the graft from the native vessels in the thigh; there is monophasic flow visualized in the proximal thigh, with dampened monophasic flow in the mid to distal thigh. There is monophasic flow within a superficial vessel, possibly a collateral, which courses from the distal thigh to the distal knee area. There is monophasic flow noted in the mid posterior tibial artery. Unable to visualize flow within the mid-distal posterior tibial artery, dorsalis pedis artery, and peroneal artery.  11/02/2013 11:01 AM Gertie Fey, RVT, RDCS, RDMS  Sherren Kerns, RVS

## 2013-11-03 LAB — CBC
HCT: 27.6 % — ABNORMAL LOW (ref 39.0–52.0)
Hemoglobin: 9.5 g/dL — ABNORMAL LOW (ref 13.0–17.0)
MCH: 32.5 pg (ref 26.0–34.0)
MCHC: 34.4 g/dL (ref 30.0–36.0)
MCV: 94.5 fL (ref 78.0–100.0)
Platelets: 183 10*3/uL (ref 150–400)
RBC: 2.92 MIL/uL — ABNORMAL LOW (ref 4.22–5.81)
RDW: 14.3 % (ref 11.5–15.5)
WBC: 7.9 10*3/uL (ref 4.0–10.5)

## 2013-11-03 LAB — BASIC METABOLIC PANEL
ANION GAP: 11 (ref 5–15)
BUN: 13 mg/dL (ref 6–23)
CHLORIDE: 110 meq/L (ref 96–112)
CO2: 21 meq/L (ref 19–32)
Calcium: 8.3 mg/dL — ABNORMAL LOW (ref 8.4–10.5)
Creatinine, Ser: 0.95 mg/dL (ref 0.50–1.35)
GFR calc Af Amer: >90 mL/min (ref >90)
GFR calc non Af Amer: 82 mL/min — ABNORMAL LOW (ref >90)
Glucose, Bld: 100 mg/dL — ABNORMAL HIGH (ref 70–99)
Potassium: 3.9 mEq/L (ref 3.7–5.3)
Sodium: 142 mEq/L (ref 137–147)

## 2013-11-03 LAB — APTT: aPTT: 43 seconds — ABNORMAL HIGH (ref 24–37)

## 2013-11-03 LAB — HEPARIN LEVEL (UNFRACTIONATED)
Heparin Unfractionated: <0.1 IU/mL — ABNORMAL LOW (ref 0.30–0.70)
Heparin Unfractionated: <0.1 IU/mL — ABNORMAL LOW (ref 0.30–0.70)

## 2013-11-03 MED ORDER — HEPARIN (PORCINE) IN NACL 100-0.45 UNIT/ML-% IJ SOLN
2000.0000 [IU]/h | INTRAMUSCULAR | Status: AC
  Administered 2013-11-04: 1300 [IU]/h via INTRAVENOUS
  Administered 2013-11-04 (×2): 1750 [IU]/h via INTRAVENOUS
  Administered 2013-11-04 – 2013-11-05 (×2): 2000 [IU]/h via INTRAVENOUS
  Filled 2013-11-03 (×5): qty 250

## 2013-11-03 NOTE — Progress Notes (Signed)
Progress Note    11/03/2013 8:10 AM 11 Days Post-Op  Subjective:  States he is a little sore. States his foot feels ok  Tm 99 VSS 96% RA  Filed Vitals:   11/03/13 0500  BP: 134/66  Pulse: 78  Temp: 98.9 F (37.2 C)  Resp: 18    Physical Exam: Cardiac:  regular Lungs:  Non labored Incisions:  All are healing nicely Extremities:  Ecchymosis left thigh and left arm.  Unable to hear a doppler signal in the left PT/DP; monophasic peroneal.  There is a + doppler signal just lateral to the below knee incision and behind the knee.  CBC    Component Value Date/Time   WBC 7.9 11/03/2013 0541   RBC 2.92* 11/03/2013 0541   HGB 9.5* 11/03/2013 0541   HCT 27.6* 11/03/2013 0541   PLT 183 11/03/2013 0541   MCV 94.5 11/03/2013 0541   MCH 32.5 11/03/2013 0541   MCHC 34.4 11/03/2013 0541   RDW 14.3 11/03/2013 0541   LYMPHSABS 1.6 10/24/2013 0635   MONOABS 1.0 10/24/2013 0635   EOSABS 0.0 10/24/2013 0635   BASOSABS 0.0 10/24/2013 0635    BMET    Component Value Date/Time   NA 142 11/03/2013 0541   K 3.9 11/03/2013 0541   CL 110 11/03/2013 0541   CO2 21 11/03/2013 0541   GLUCOSE 100* 11/03/2013 0541   BUN 13 11/03/2013 0541   CREATININE 0.95 11/03/2013 0541   CALCIUM 8.3* 11/03/2013 0541   GFRNONAA 82* 11/03/2013 0541   GFRAA >90 11/03/2013 0541    INR    Component Value Date/Time   INR 1.15 10/29/2013 0238     Intake/Output Summary (Last 24 hours) at 11/03/13 0810 Last data filed at 11/03/13 0500  Gross per 24 hour  Intake    992 ml  Output   4575 ml  Net  -3583 ml   Arterial Duplex 11/02/13: Vascular Ultrasound  Left Lower Extremity Arterial Duplex has been completed.  Study was very technically difficult and limited due to extensive edema and bruising.  Unable to visualize the proximal and distal anastomoses, left common femoral artery, left and proximal femoral artery due to technical limitations.  Unable to discern the graft from the native vessels in the thigh; there is  monophasic flow visualized in the proximal thigh, with dampened monophasic flow in the mid to distal thigh. There is monophasic flow within a superficial vessel, possibly a collateral, which courses from the distal thigh to the distal knee area. There is monophasic flow noted in the mid posterior tibial artery.  Unable to visualize flow within the mid-distal posterior tibial artery, dorsalis pedis artery, and peroneal artery.   Assessment:  68 y.o. male is s/p:  1: Left femoral to posterior tibial bypass graft with ipsilateral non-reversed translocated greater saphenous vein  #2: Thrombectomy of left posterior tibial artery  #3: Ligation of left below knee popliteal artery 5 Days Post  #1: Thrombectomy of left femoral to posterior tibial artery bypass graft  #2: Reexploration of the left common femoral and left posterior tibial artery  #3: Exposure of the posterior tibial artery at the ankle with vein patch angioplasty  #4: Intra-arterial administration of nitroglycerin and TPA  #5: Left lower extremity angiogram   4 Days Post-Op  Plan: -continue heparin-will order full dose per pharmacy.  Long term anticoagulation will be either Xarelto or Plavix--Dr. Myra Gianotti to decide at discharge. -DVT prophylaxis:  Heparin gtt -pt needs to mobilize more today--continue working with PT.  Doreatha Massed, PA-C [facility] [phone] 11/03/2013 8:10 AM    This morning.  I was able to get a Doppler signal in the distal thigh, within the below knee incision and proximal to the posterior tibial incision at the ankle.  He with the ultrasound yesterday could not identify the graft, by my hand-held Doppler examination I suspect that it open.  Regardless, if the graft has occluded, I'm not sure any further surgical treatment we'll change the course.  Fortunately, the patient is able to a blade and his foot is feeling better.  I will switch him to full dose heparin with plans for  anticoagulation likely with Xaralto at discharge.  Durene Cal

## 2013-11-03 NOTE — Care Management Note (Addendum)
Page 1 of 2   11/16/2013     12:08:29 PM CARE MANAGEMENT NOTE 11/16/2013  Patient:  [name] [name], [name] [name]   Account Number:  [id number]  Date Initiated:  11/03/2013  Documentation initiated by:  Kadin Canipe  Subjective/Objective Assessment:   Pt s/p mult vascular procedures this admission after admission for lt lower extremity limb ischemia with popliteal stent occlusion.  PTA, pt resides at home with spouse and is fairly independent.     Action/Plan:   PT recommending HH follow up at dc.  Will cont to follow progress.   Anticipated DC Date:  11/15/2013   Anticipated DC Plan:  [place]  CM consult  Medication Assistance      [facility] Choice  HOME HEALTH   Choice offered to / List presented to:  C-1 Patient        [place] arranged  [place]      [place].   Status of service:  Completed, signed off Medicare Important Message given?  YES (If response is "NO", the following Medicare IM given date fields will be blank) Date Medicare IM given:  11/02/2013 Medicare IM given by:  Kamali Sakata Date Additional Medicare IM given:  11/16/2013 Additional Medicare IM given by:  Ellan Lambert  Discharge Disposition:  [place]  Per UR Regulation:  Reviewed for med. necessity/level of care/duration of stay  If discussed at [place] of Stay Meetings, dates discussed:   11/03/2013  11/05/2013  11/10/2013    Comments:  11/16/13 Ellan Lambert, RN, BSN [phone] Pt for dc home today; notified [facility] of dc home today, and need for [facility] in addition to [place].  Start of care 24-48h post dc date.  Left message for [facility] office as well, regarding pt dc.  11/12/13 Ellan Lambert, RN, BSN [phone] Notified by [facility] [place] office to call Ardell Isaacs at [facility] agency upon pt's discharge.  Phone # is [phone], ext [phone].   11/06/13 Ellan Lambert, RN, BSN [phone]  Per notes, plan is now to dc on  Coumadin with Lovenox bridge.  Pt will need to stay in-house until INR therapeutic, per MD notes.  Will follow progress.  S/W ASHLEY # [phone] [employer]  ENOXAPARIN  80MG    BID FOR 5 DAYS 10 SYRINGES COVER-YES CO-PAY- $ 187.96  FOR 10 SYRINGES 28 DAYS A QUANTITY-LIMITED-28 TIER-4 DRUG PRIOR APPROVAL -NO PHARMACY: [facility]  ALSO 100 MG BID FOR 5 DAYS 10 SYRINGES COVER-YES CO-PAY $ 225.70 FOR 10 SPRINGES QUANTITY- LIMITED 28 DAYS TIER: 4 DRUG PRIOR APPROVAL - NO PHARMACY; [facility]  LOVENOX- NOT COVER  XARELTO 20 MG DAILY*** NOT ON FORMULARY****  *** CHECK OTHER MEDICINE*** 1 .PRADAXA 150 MG  BID  FOR 30 DAYS COVER- YES CO-PAY : $ 315.00 FOR 30 DAYS SUPPLY TIER- 3DRUG PRIOR APPROVAL-NO PHARMACY: STANDARD  2.ELIQUIS 5 MG  BID  COVER- YES CO-PAY:$ 318.88 FOR  30 DAY SUPPLY TIER- 3 DRUG PRIOR APPROVAL-NO PHARMACY; STANDARD  3. WARFARIN 5 MG BIG COVER-YES CO-PAY- $ 1.00 FOR A 30 DAY SUPPLY PRIOR APPROVAL -NO PHARMACY ; STANDARD  ** MAIL ORDER FOR 90 DAYS SUPPLY - ZERO  DOLLARS **  Notified [facility] PA of non-coverage of Xarelto and high copays for other anticoags.  Pt will need Lovenox/Coumadin.  Will check coverage for Enoxaparin.  Pt is agreeable to [facility] follow up for physical therapy.  Referral to [facility],  per pt/wife choice.  Start of care 24-48h post op.    11/02/13 Ellan Lambert, RN, BSN [phone] Met with pt to discuss dc needs.  Pt states wife to assist at dc.  Discussed HH follow up with pt, and he is agreeable to this, if it is needed.  Will follow for orders.

## 2013-11-03 NOTE — Addendum Note (Signed)
Addendum created 11/03/13 1000 by Heather Roberts, MD   Modules edited: Anesthesia Events

## 2013-11-03 NOTE — Progress Notes (Signed)
Nursing note Patient ambulated in hallway with family and rolling walker. Tolerated well back in chair will monitor patient. Vermon Grays, Randall An RN

## 2013-11-03 NOTE — Progress Notes (Signed)
ANTICOAGULATION CONSULT NOTE - Initial Consult  Pharmacy Consult for heparin Indication: LLE ischemia s/p arteriogram and thrombectomy  Allergies  Allergen Reactions  . Omnipaque [Iohexol] Rash    Pt requires full premeds and needs to bring a driver.  Broke out in rash on back and arms.  Ok w/ premeds  . Oxytetracycline Rash    Terramycin causes rash    Patient Measurements: Height: 6' 2.02" (188 cm) Weight: 196 lb 1.6 oz (88.95 kg) IBW/kg (Calculated) : 82.24 Heparin Dosing Weight: 89kg  Vital Signs: Temp: 98.9 F (37.2 C) (09/22 0500) Temp src: Oral (09/22 0500) BP: 134/66 mmHg (09/22 0500) Pulse Rate: 78 (09/22 0500)  Labs:  Recent Labs  11/01/13 0430 11/02/13 0555 11/03/13 0541  HGB  --   --  9.5*  HCT  --   --  27.6*  PLT  --   --  183  APTT 35 35 43*  HEPARINUNFRC  --   --  <0.10*  CREATININE  --   --  0.95    Estimated Creatinine Clearance: 84.1 ml/min (by C-G formula based on Cr of 0.95).   Medical History: Past Medical History  Diagnosis Date  . Abdominal aortic aneurysm   . Popliteal aneurysm   . Hyperlipidemia   . Hypertension   . Coronary artery disease   . Detached retina   . Myocardial infarction     2007 heart stent  . Chronic kidney disease     Right kidney stone    Assessment: 22 YOM s/p arteriogram + thrombectomy of LLE by [facility] on 9/11. Anticoagulation had to be stopped d/t hematoma and shock. Patient went back to OR 9/18 for repeat thrombectomy and was started on heparin drip per MD with aPTT checks every morning. Now to transition to full heparin drip dosing per pharmacy. A heparin level added on to this morning's labs was undetectable. Long term anticoagulation to be determined by [facility].  Goal of Therapy:  Heparin level 0.3-0.7 units/ml Monitor platelets by anticoagulation protocol: Yes   Plan:  1. Increase heparin drip to 1100 units/hr 2. Heparin level in 6 hours 3. Daily HL and CBC 4. Follow for long term anticoagulation  plans  Zyion Leidner D. Einer Meals, PharmD, BCPS Clinical Pharmacist Pager: [phone] 11/03/2013 10:22 AM

## 2013-11-03 NOTE — Progress Notes (Signed)
ANTICOAGULATION CONSULT NOTE - Follow Up Consult  Pharmacy Consult for heparin Indication: LLE ischemia s/p arteriogram and thrombectomy  Allergies  Allergen Reactions  . Omnipaque [Iohexol] Rash    Pt requires full premeds and needs to bring a driver.  Broke out in rash on back and arms.  Ok w/ premeds  . Oxytetracycline Rash    Terramycin causes rash    Patient Measurements: Height: 6' 2.02" (188 cm) Weight: 196 lb 1.6 oz (88.95 kg) IBW/kg (Calculated) : 82.24 Heparin Dosing Weight: 89kg  Vital Signs: Temp: 98.3 F (36.8 C) (09/22 1455) Temp src: Oral (09/22 1455) BP: 120/53 mmHg (09/22 1455) Pulse Rate: 104 (09/22 1455)  Labs:  Recent Labs  11/01/13 0430 11/02/13 0555 11/03/13 0541 11/03/13 1613  HGB  --   --  9.5*  --   HCT  --   --  27.6*  --   PLT  --   --  183  --   APTT 35 35 43*  --   HEPARINUNFRC  --   --  <0.10* <0.10*  CREATININE  --   --  0.95  --     Estimated Creatinine Clearance: 84.1 ml/min (by C-G formula based on Cr of 0.95).   Medical History: Past Medical History  Diagnosis Date  . Abdominal aortic aneurysm   . Popliteal aneurysm   . Hyperlipidemia   . Hypertension   . Coronary artery disease   . Detached retina   . Myocardial infarction     2007 heart stent  . Chronic kidney disease     Right kidney stone    Assessment: 28 YOM s/p arteriogram + thrombectomy of LLE by [facility] on 9/11. Anticoagulation had to be stopped d/t hematoma and shock. Patient went back to OR 9/18 for repeat thrombectomy and was started on heparin drip 800 uts/hr per MD with aPTT checks every morning running in the 40s. Now to transition to full heparin drip dosing per pharmacy. A heparin level added on to this morning's labs was undetectable.  Heparin drip rate increased 1100 uts/hr HL still < 0.1.  Pt stable up ambulating in hall with walker. Long term anticoagulation to be determined by [facility].  Goal of Therapy:  Heparin level 0.3-0.7 units/ml Monitor platelets  by anticoagulation protocol: Yes   Plan:  1. Increase heparin drip to 1300 units/hr 2. Heparin level in 6 hours 3. Daily HL and CBC 4. Follow for long term anticoagulation plans  Leota Sauers Pharm.D. CPP, BCPS Clinical Pharmacist [phone] 11/03/2013 7:06 PM

## 2013-11-03 NOTE — Progress Notes (Signed)
Physical Therapy Treatment Patient Details Name: [name] [name] MRN: [id number] DOB: 31-Jan-1946 Today's Date: 11/03/2013    History of Present Illness Patient s/p placement of thrombolytic catheter L leg with TNKase and heparin infusiing on 10/23/13. Pt with sudden left groin hematoma formation and pallor and shock on 9/12. Pt intubated. Pt extubated 9/14. Pt underwent Left femoral to posterior tibial bypass graft with ipsilateral non-reversed translocated greater saphenous vein and thrombectomy of left posterior tibial artery on 9/17. On 9/18 pt taken back to surgery emergently for occluded left femoral posterior tibial bypass and underwent thrombectomy and administration of nitroglycerin and TPA .Pt with history of AAA, CAD, MI, HTN.    PT Comments    Pt making good progress.  Follow Up Recommendations  Home health PT;Supervision for mobility/OOB     Equipment Recommendations  None recommended by PT    Recommendations for Other Services       Precautions / Restrictions Precautions Precautions: Fall    Mobility  Bed Mobility                  Transfers Overall transfer level: Needs assistance Equipment used: Rolling walker (2 wheeled) Transfers: Sit to/from Stand Sit to Stand: Min guard         General transfer comment: Incr time and effort but no physical assist needed.  Ambulation/Gait Ambulation/Gait assistance: Min guard Ambulation Distance (Feet): 200 Feet Assistive device: Rolling walker (2 wheeled) Gait Pattern/deviations: Step-through pattern;Decreased step length - right;Decreased step length - left;Trunk flexed Gait velocity: slow Gait velocity interpretation: Below normal speed for age/gender General Gait Details: Verbal cues to stand more erect and look up. Pt with incr step length as distance increased.   Stairs Stairs: Yes Stairs assistance: Min assist Stair Management: Step to pattern;Forwards;With walker Number of Stairs: 1 General  stair comments: Had walker straddled over 1 portable step and used handles as handrails.  Wheelchair Mobility    Modified Rankin (Stroke Patients Only)       Balance     Sitting balance-Leahy Scale: Good     Standing balance support: No upper extremity supported Standing balance-Leahy Scale: Fair                      Cognition Arousal/Alertness: Awake/alert Behavior During Therapy: WFL for tasks assessed/performed Overall Cognitive Status: Within Functional Limits for tasks assessed                      Exercises      General Comments        Pertinent Vitals/Pain Pain Assessment: 0-10 Pain Score: 2  Pain Location: LLE Pain Descriptors / Indicators: Sore Pain Intervention(s): Monitored during session    Home Living                      Prior Function            PT Goals (current goals can now be found in the care plan section) Progress towards PT goals: Progressing toward goals    Frequency  Min 3X/week    PT Plan Current plan remains appropriate    Co-evaluation             End of Session Equipment Utilized During Treatment: Gait belt Activity Tolerance: Patient tolerated treatment well Patient left: in chair;with call bell/phone within reach     Time: 1126-1138 PT Time Calculation (min): 12 min  Charges:  $Gait Training: 8-22 mins  G Codes:      [name] [name] 11/03/2013, 2:32 PM  [facility] PT [phone]

## 2013-11-04 LAB — CBC
HCT: 28.5 % — ABNORMAL LOW (ref 39.0–52.0)
Hemoglobin: 9.6 g/dL — ABNORMAL LOW (ref 13.0–17.0)
MCH: 31.4 pg (ref 26.0–34.0)
MCHC: 33.7 g/dL (ref 30.0–36.0)
MCV: 93.1 fL (ref 78.0–100.0)
PLATELETS: 182 10*3/uL (ref 150–400)
RBC: 3.06 MIL/uL — AB (ref 4.22–5.81)
RDW: 14.6 % (ref 11.5–15.5)
WBC: 8.5 10*3/uL (ref 4.0–10.5)

## 2013-11-04 LAB — HEPARIN LEVEL (UNFRACTIONATED)
HEPARIN UNFRACTIONATED: 0.1 [IU]/mL — AB (ref 0.30–0.70)
HEPARIN UNFRACTIONATED: 0.16 [IU]/mL — AB (ref 0.30–0.70)
HEPARIN UNFRACTIONATED: 0.11 [IU]/mL — AB (ref 0.30–0.70)

## 2013-11-04 NOTE — Progress Notes (Addendum)
  Progress Note    11/04/2013 7:47 AM 12 Days Post-Op  Subjective:  No complaints this am.  States he walked in halls 3x.    Tm 99.3 now afebrile HR 70's-100's regular 110's-120's systolic 97% RA  Filed Vitals:   11/04/13 0417  BP: 115/61  Pulse: 73  Temp: 98.8 F (37.1 C)  Resp: 17    Physical Exam: Cardiac:  regular Lungs:  Non labored Incisions:  All healing nicely-still with extensive ecchymosis Extremities:  + monophasic doppler signal just above the incision at the left ankle.  + biphasic left peroneal signal; there is also a + biphasic doppler signal medial to the below knee incision.  Motor is in tact left foot.  Left toes still numb to sensation.  CBC    Component Value Date/Time   WBC 8.5 11/04/2013 0047   RBC 3.06* 11/04/2013 0047   HGB 9.6* 11/04/2013 0047   HCT 28.5* 11/04/2013 0047   PLT 182 11/04/2013 0047   MCV 93.1 11/04/2013 0047   MCH 31.4 11/04/2013 0047   MCHC 33.7 11/04/2013 0047   RDW 14.6 11/04/2013 0047   LYMPHSABS 1.6 10/24/2013 0635   MONOABS 1.0 10/24/2013 0635   EOSABS 0.0 10/24/2013 0635   BASOSABS 0.0 10/24/2013 0635    BMET    Component Value Date/Time   NA 142 11/03/2013 0541   K 3.9 11/03/2013 0541   CL 110 11/03/2013 0541   CO2 21 11/03/2013 0541   GLUCOSE 100* 11/03/2013 0541   BUN 13 11/03/2013 0541   CREATININE 0.95 11/03/2013 0541   CALCIUM 8.3* 11/03/2013 0541   GFRNONAA 82* 11/03/2013 0541   GFRAA >90 11/03/2013 0541    INR    Component Value Date/Time   INR 1.15 10/29/2013 0238     Intake/Output Summary (Last 24 hours) at 11/04/13 0747 Last data filed at 11/03/13 2254  Gross per 24 hour  Intake 2497.5 ml  Output   3200 ml  Net -702.5 ml     Assessment:  68 y.o. male is s/p:  1: Left femoral to posterior tibial bypass graft with ipsilateral non-reversed translocated greater saphenous vein  #2: Thrombectomy of left posterior tibial artery  #3: Ligation of left below knee popliteal artery  6 Days Post  #1: Thrombectomy  of left femoral to posterior tibial artery bypass graft  #2: Reexploration of the left common femoral and left posterior tibial artery  #3: Exposure of the posterior tibial artery at the ankle with vein patch angioplasty  #4: Intra-arterial administration of nitroglycerin and TPA  #5: Left lower extremity angiogram  5 Days Post-Op    Plan: -pt doing well this am-full dose heparin started yesterday. -motor is in tact -walked 3x in hallways yesterday--will have him increase mobilization again today. -when discharged, will discharge him on Xarelto. -DVT prophylaxis:  Full dose heparin per pharmacy -will d/c SCD's as he is on full dose heparin.   Doreatha Massed, PA-C [facility] [phone] 11/04/2013 7:47 AM    Doppler signals better today.  Walked 3x yesterday.  Consider rehab vs home soon with Wall[facility] Dales

## 2013-11-04 NOTE — Progress Notes (Addendum)
Pt ambulating in hall with family. [name] [name] 2:35 PM   Pt ambulating in hall with family. [name] [name] 4:00 PM

## 2013-11-04 NOTE — Progress Notes (Addendum)
NUTRITION FOLLOW UP  INTERVENTION: No nutrition intervention warranted at this time  RD to continue to follow  NUTRITION DIAGNOSIS: Inadequate oral intake, resolved  Goal: Pt to meet >/= 90% of their estimated nutrition needs, met  Monitor:  PO intake, weight, labs, I/O's  ASSESSMENT: 57 M with ischemic left foot 1 day status post placement of thrombolytic catheter in L femoral artery. AM of 9/12 developed bradycardia in conjunction with sudden appearance of hematomas of L should and L groin. PCCM consulted for emergent management.   Pt extubated 9/14.  S/p procedures 9/18: #1: Left femoral to posterior tibial bypass graft with ipsilateral non-reversed translocated greater saphenous vein  #2: Thrombectomy of left posterior tibial artery  #3: Ligation of left below knee popliteal artery   Pt reports a good appetite.  PO intake 100% per flowsheet records.  Feel he is meeting >/= 90% of re-estimated nutrition needs.  Height: Ht Readings from Last 1 Encounters:  10/23/13 6' 2.02" (1.88 m)    Weight: Wt Readings from Last 1 Encounters:  10/30/13 196 lb 1.6 oz (88.95 kg)    BMI:  Body mass index is 25.17 kg/(m^2).  Re-estimated Nutritional Needs: Kcal: 2000-2200 Protein: 100-110 gm Fluid: per MD  Skin: puncture on left groin  Diet Order: Cardiac   Intake/Output Summary (Last 24 hours) at 11/04/13 1056 Last data filed at 11/04/13 1050  Gross per 24 hour  Intake 2497.5 ml  Output   4700 ml  Net -2202.5 ml   Labs:   Recent Labs Lab 10/30/13 0239 10/31/13 0104 11/03/13 0541  NA 142 143 142  K 4.2 4.0 3.9  CL 107 110 110  CO2 '24 23 21  ' BUN '16 17 13  ' CREATININE 0.87 0.95 0.95  CALCIUM 8.7 8.3* 8.3*  GLUCOSE 110* 107* 100*    Scheduled Meds: . antiseptic oral rinse  7 mL Mouth Rinse BID  . docusate sodium  100 mg Oral Daily  . metoprolol tartrate  25 mg Oral BID  . nicotine  7 mg Transdermal Daily  . omega-3 acid ethyl esters  3 g Oral BID  .  pantoprazole  40 mg Oral Daily  . Saw Palmetto  900 mg Oral BID    Continuous Infusions: . DOPamine    . heparin 1,500 Units/hr (11/04/13 0324)    Past Medical History  Diagnosis Date  . Abdominal aortic aneurysm   . Popliteal aneurysm   . Hyperlipidemia   . Hypertension   . Coronary artery disease   . Detached retina   . Myocardial infarction     2007 heart stent  . Chronic kidney disease     Right kidney stone    Past Surgical History  Procedure Laterality Date  . Coronary stenting      2007  . Abdominal aortic aneurysm repair  11/24/09    EVAR  . Popliteal artery stent  11/2009    left popliteal artery   . Retinal detachment surgery Right 2011  . Tooth extraction  June 2015    Wisdom Tooth Extraction  . Femoral-popliteal bypass graft Left 10/29/2013    Procedure: BYPASS GRAFT FEMORAL-POPLITEAL ARTERY;  Surgeon: Serafina Mitchell, MD;  Location: [facility];  Service: Vascular;  Laterality: Left;  Left Femoral to Posterior Tibial artery bypass graft using nonreversed saphenous vein. and thrombectomy of Tibial arterly.  . Femoral-tibial bypass graft Left 10/30/2013    Procedure: Reexploration of Femoral-Posterior Tibial Bypass; Thrombectomy of Bypass; Left Leg Angiogram; Posterior Tibial Vein Patch Angioplasty;  Surgeon: Serafina Mitchell, MD;  Location: [facility];  Service: Vascular;  Laterality: Left;    Arthur Holms, RD, LDN Pager #: [phone] After-Hours Pager #: [phone]

## 2013-11-04 NOTE — Progress Notes (Addendum)
ANTICOAGULATION CONSULT NOTE - Follow Up Consult  Pharmacy Consult for heparin Indication: LLE ischemia s/p arteriogram and thrombectomy  Allergies  Allergen Reactions  . Omnipaque [Iohexol] Rash    Pt requires full premeds and needs to bring a driver.  Broke out in rash on back and arms.  Ok w/ premeds  . Oxytetracycline Rash    Terramycin causes rash    Patient Measurements: Height: 6' 2.02" (188 cm) Weight: 196 lb 1.6 oz (88.95 kg) IBW/kg (Calculated) : 82.24 Heparin Dosing Weight: 89kg  Vital Signs: Temp: 97.9 F (36.6 C) (09/23 1340) Temp src: Oral (09/23 1340) BP: 112/61 mmHg (09/23 1340) Pulse Rate: 73 (09/23 1340)  Labs:  Recent Labs  11/02/13 0555 11/03/13 0541  11/04/13 0047 11/04/13 0950 11/04/13 1841  HGB  --  9.5*  --  9.6*  --   --   HCT  --  27.6*  --  28.5*  --   --   PLT  --  183  --  182  --   --   APTT 35 43*  --   --   --   --   HEPARINUNFRC  --  <0.10*  < > 0.11* 0.10* 0.16*  CREATININE  --  0.95  --   --   --   --   < > = values in this interval not displayed.  Estimated Creatinine Clearance: 84.1 ml/min (by C-G formula based on Cr of 0.95).   Assessment: [name] s/p arteriogram + thrombectomy of LLE by [facility] on 9/11. Anticoagulation had to be stopped d/t hematoma and shock. Patient went back to OR 9/18 for repeat thrombectomy and was started on heparin drip 800 uts/hr per MD with aPTT checks every morning running in the 40s. Now on heparin per pharmacy. Even after 2 dose increases, heparin level remains low. Confirmed with RN there are no issues with the line and no reports of drip being held. Hgb and plts are low but stable. No bleeding noted.  Goal of Therapy:  Heparin level 0.3-0.7 units/ml Monitor platelets by anticoagulation protocol: Yes   Plan:  1. Increase heparin drip to 1750 units/hr 2. Heparin level in 6 hours 3. Daily HL and CBC 4. Noted vascular's plans to discharge on Xarelto- will follow for orders  Lauren D. Bajbus,  PharmD, BCPS Clinical Pharmacist Pager: [phone] 11/04/2013 8:07 PM  Addendum: PM recheck of heparin level on 1750 units/hr is trending up but still low at 0.16. No IV issues or bleeding noted. Will again increase rate.  Plan: Increase heparin to 2000 units/hr Recheck in 8 hours (am labs)  Sheppard Coil PharmD., BCPS Clinical Pharmacist Pager [phone] 11/04/2013 8:08 PM

## 2013-11-04 NOTE — Progress Notes (Signed)
ANTICOAGULATION CONSULT NOTE - Follow Up Consult  Pharmacy Consult for heparin Indication: LLE ischemia s/p arteriogram and thrombectomy  Allergies  Allergen Reactions  . Omnipaque [Iohexol] Rash    Pt requires full premeds and needs to bring a driver.  Broke out in rash on back and arms.  Ok w/ premeds  . Oxytetracycline Rash    Terramycin causes rash    Patient Measurements: Height: 6' 2.02" (188 cm) Weight: 196 lb 1.6 oz (88.95 kg) IBW/kg (Calculated) : 82.24 Heparin Dosing Weight: 89kg  Vital Signs: Temp: 99.3 F (37.4 C) (09/22 2100) Temp src: Oral (09/22 2100) BP: 120/56 mmHg (09/22 2100) Pulse Rate: 88 (09/22 2100)  Labs:  Recent Labs  11/01/13 0430 11/02/13 0555 11/03/13 0541 11/03/13 1613 11/04/13 0047  HGB  --   --  9.5*  --  9.6*  HCT  --   --  27.6*  --  28.5*  PLT  --   --  183  --  182  APTT 35 35 43*  --   --   HEPARINUNFRC  --   --  <0.10* <0.10* 0.11*  CREATININE  --   --  0.95  --   --     Estimated Creatinine Clearance: 84.1 ml/min (by C-G formula based on Cr of 0.95).   Medical History: Past Medical History  Diagnosis Date  . Abdominal aortic aneurysm   . Popliteal aneurysm   . Hyperlipidemia   . Hypertension   . Coronary artery disease   . Detached retina   . Myocardial infarction     2007 heart stent  . Chronic kidney disease     Right kidney stone    Assessment: 55 YOM s/p arteriogram + thrombectomy of LLE by [facility] on 9/11. Anticoagulation had to be stopped d/t hematoma and shock. Patient went back to OR 9/18 for repeat thrombectomy and was started on heparin drip 800 uts/hr per MD with aPTT checks every morning running in the 40s. Now transitioned to full heparin drip dosing per pharmacy. Repeat HL at midnight was sub-therapeutic at 0.11.  Long term anticoagulation to be determined by [facility].  Goal of Therapy:  Heparin level 0.3-0.7 units/ml Monitor platelets by anticoagulation protocol: Yes   Plan:  1. Increase heparin drip to  1500 units/hr 2. Heparin level in 6 hours 3. Daily HL and CBC 4. Follow for long term anticoagulation plans  Vinnie Level, PharmD.  Clinical Pharmacist Pager [phone]

## 2013-11-05 ENCOUNTER — Telehealth: Payer: Self-pay | Admitting: Surgery

## 2013-11-05 LAB — CBC
HCT: 29.6 % — ABNORMAL LOW (ref 39.0–52.0)
Hemoglobin: 9.9 g/dL — ABNORMAL LOW (ref 13.0–17.0)
MCH: 31 pg (ref 26.0–34.0)
MCHC: 33.4 g/dL (ref 30.0–36.0)
MCV: 92.8 fL (ref 78.0–100.0)
PLATELETS: 147 10*3/uL — AB (ref 150–400)
RBC: 3.19 MIL/uL — ABNORMAL LOW (ref 4.22–5.81)
RDW: 14.6 % (ref 11.5–15.5)
WBC: 8.5 10*3/uL (ref 4.0–10.5)

## 2013-11-05 LAB — HEPARIN LEVEL (UNFRACTIONATED): HEPARIN UNFRACTIONATED: 0.65 [IU]/mL (ref 0.30–0.70)

## 2013-11-05 MED ORDER — WARFARIN - PHARMACIST DOSING INPATIENT
Freq: Every day | Status: DC
Start: 2013-11-05 — End: 2013-11-16
  Administered 2013-11-08 – 2013-11-12 (×2)

## 2013-11-05 MED ORDER — RIVAROXABAN 20 MG PO TABS: 20.0000 mg | ORAL_TABLET | Freq: Every day | ORAL | Status: DC

## 2013-11-05 MED ORDER — RIVAROXABAN 15 MG PO TABS
15.0000 mg | ORAL_TABLET | Freq: Two times a day (BID) | ORAL | Status: DC
  Administered 2013-11-05: 15 mg via ORAL
  Filled 2013-11-05 (×3): qty 1

## 2013-11-05 MED ORDER — COUMADIN BOOK
Freq: Once | Status: DC
  Filled 2013-11-05: qty 1

## 2013-11-05 MED ORDER — OXYCODONE HCL 5 MG PO TABS: 5.0000 mg | ORAL_TABLET | Freq: Four times a day (QID) | ORAL | Status: DC | PRN

## 2013-11-05 MED ORDER — WARFARIN SODIUM 5 MG PO TABS
5.0000 mg | ORAL_TABLET | Freq: Once | ORAL | Status: AC
Start: 2013-11-05 — End: 2013-11-05
  Administered 2013-11-05: 5 mg via ORAL
  Filled 2013-11-05 (×2): qty 1

## 2013-11-05 MED ORDER — ENOXAPARIN SODIUM 100 MG/ML ~~LOC~~ SOLN
90.0000 mg | Freq: Two times a day (BID) | SUBCUTANEOUS | Status: DC
  Administered 2013-11-05 – 2013-11-12 (×15): 90 mg via SUBCUTANEOUS
  Administered 2013-11-13: 23:00:00 via SUBCUTANEOUS
  Administered 2013-11-13 – 2013-11-14 (×3): 90 mg via SUBCUTANEOUS
  Filled 2013-11-05 (×24): qty 1

## 2013-11-05 MED ORDER — RIVAROXABAN 15 MG PO TABS: 15.0000 mg | ORAL_TABLET | Freq: Two times a day (BID) | ORAL | Status: DC

## 2013-11-05 MED ORDER — WARFARIN VIDEO
Freq: Once | Status: AC
  Administered 2013-11-10: 18:00:00

## 2013-11-05 MED ORDER — WARFARIN SODIUM 5 MG PO TABS: 5.0000 mg | ORAL_TABLET | Freq: Every day | ORAL | Status: DC

## 2013-11-05 MED ORDER — WARFARIN SODIUM 7.5 MG PO TABS
7.5000 mg | ORAL_TABLET | Freq: Once | ORAL | Status: DC
  Filled 2013-11-05: qty 1

## 2013-11-05 NOTE — Progress Notes (Signed)
  Progress Note    11/05/2013 7:29 AM 13 Days Post-Op  Subjective:  No complaints  Afebrile HR 60's-70's regular 110's-120's systolic 96% RA  Filed Vitals:   11/04/13 2118  BP: 123/61  Pulse: 60  Temp: 98.2 F (36.8 C)  Resp: 18    Physical Exam: Cardiac:  regular Lungs:  Non labored Incisions:  All healing nicely Extremities:  + monophasic doppler signal just above the incision at the left ankle. + biphasic left peroneal signal;  Motor is in tact left foot. Left toes still numb to sensation  CBC    Component Value Date/Time   WBC 8.5 11/05/2013 0554   RBC 3.19* 11/05/2013 0554   HGB 9.9* 11/05/2013 0554   HCT 29.6* 11/05/2013 0554   PLT 147* 11/05/2013 0554   MCV 92.8 11/05/2013 0554   MCH 31.0 11/05/2013 0554   MCHC 33.4 11/05/2013 0554   RDW 14.6 11/05/2013 0554   LYMPHSABS 1.6 10/24/2013 0635   MONOABS 1.0 10/24/2013 0635   EOSABS 0.0 10/24/2013 0635   BASOSABS 0.0 10/24/2013 0635    BMET    Component Value Date/Time   NA 142 11/03/2013 0541   K 3.9 11/03/2013 0541   CL 110 11/03/2013 0541   CO2 21 11/03/2013 0541   GLUCOSE 100* 11/03/2013 0541   BUN 13 11/03/2013 0541   CREATININE 0.95 11/03/2013 0541   CALCIUM 8.3* 11/03/2013 0541   GFRNONAA 82* 11/03/2013 0541   GFRAA >90 11/03/2013 0541    INR    Component Value Date/Time   INR 1.15 10/29/2013 0238     Intake/Output Summary (Last 24 hours) at 11/05/13 0729 Last data filed at 11/05/13 0645  Gross per 24 hour  Intake   1209 ml  Output   4150 ml  Net  -2941 ml     Assessment:  68 y.o. male is s/p:  1: Left femoral to posterior tibial bypass graft with ipsilateral non-reversed translocated greater saphenous vein  #2: Thrombectomy of left posterior tibial artery  #3: Ligation of left below knee popliteal artery  7 Days Post  #1: Thrombectomy of left femoral to posterior tibial artery bypass graft  #2: Reexploration of the left common femoral and left posterior tibial artery  #3: Exposure of the  posterior tibial artery at the ankle with vein patch angioplasty  #4: Intra-arterial administration of nitroglycerin and TPA  #5: Left lower extremity angiogram  6 Days Post-Op     Plan: -pt doing well this am-doppler exam has not changed since yesterday -DVT prophylaxis:  Heparin gtt -possibly home today--will discuss with Dr. Myra Gianotti  -will start Xarelto at discharge    Doreatha Massed, PA-C [facility] [phone] 11/05/2013 7:29 AM    I agree with the above.  We will try for d/c today after eval by PT.  I will start Xaralto.  Durene Cal

## 2013-11-05 NOTE — Progress Notes (Signed)
ANTICOAGULATION CONSULT NOTE - Follow Up Consult  Pharmacy Consult for heparin Indication: LLE ischemia s/p arteriogram and thrombectomy  Allergies  Allergen Reactions  . Omnipaque [Iohexol] Rash    Pt requires full premeds and needs to bring a driver.  Broke out in rash on back and arms.  Ok w/ premeds  . Oxytetracycline Rash    Terramycin causes rash    Patient Measurements: Height: 6' 2.02" (188 cm) Weight: 196 lb 1.6 oz (88.95 kg) IBW/kg (Calculated) : 82.24 Heparin Dosing Weight: 89kg  Vital Signs: Temp: 98.2 F (36.8 C) (09/23 2118) Temp src: Oral (09/23 2118) BP: 123/61 mmHg (09/23 2118) Pulse Rate: 60 (09/23 2118)  Labs:  Recent Labs  11/03/13 0541  11/04/13 0047 11/04/13 0950 11/04/13 1841 11/05/13 0554  HGB 9.5*  --  9.6*  --   --  9.9*  HCT 27.6*  --  28.5*  --   --  29.6*  PLT 183  --  182  --   --  147*  APTT 43*  --   --   --   --   --   HEPARINUNFRC <0.10*  < > 0.11* 0.10* 0.16* 0.65  CREATININE 0.95  --   --   --   --   --   < > = values in this interval not displayed.  Estimated Creatinine Clearance: 84.1 ml/min (by C-G formula based on Cr of 0.95).   Assessment: [name] s/p arteriogram + thrombectomy of LLE by [facility] on 9/11. Anticoagulation had to be stopped d/t hematoma and shock. Patient went back to OR 9/18 for repeat thrombectomy and was started on heparin drip 800 uts/hr per MD with aPTT checks every morning running in the 40s. Now on heparin per pharmacy. Heparin level remained consistently low and has trended up to 0.65 this AM on heparin 2000 units/hr - therapeutic. Confirmed with RN there are no issues with the line and no reports of drip being held. Hgb and plts are low but stable. No bleeding noted.  Goal of Therapy:  Heparin level 0.3-0.7 units/ml Monitor platelets by anticoagulation protocol: Yes   Plan:  1. Continue heparin infusion at 2000 units/hr  2. Heparin level in 6 hours 3. Daily HL and CBC 4. Noted vascular's plans to  discharge on Xarelto- will follow for orders  Vinnie Level, PharmD.  Clinical Pharmacist Pager [phone]

## 2013-11-05 NOTE — Progress Notes (Addendum)
Occupational Therapy Treatment Patient Details Name: [name] [name] MRN: [id number] DOB: October 13, 1945 Today's Date: 11/05/2013    History of present illness Patient s/p placement of thrombolytic catheter L leg with TNKase and heparin infusiing on 10/23/13. Pt with sudden left groin hematoma formation and pallor and shock on 9/12. Pt intubated. Pt extubated 9/14. Pt underwent Left femoral to posterior tibial bypass graft with ipsilateral non-reversed translocated greater saphenous vein and thrombectomy of left posterior tibial artery on 9/17. On 9/18 pt taken back to surgery emergently for occluded left femoral posterior tibial bypass and underwent thrombectomy and administration of nitroglycerin and TPA .Pt with history of AAA, CAD, MI, HTN.   OT comments  Pt making progress with functional goals and should continue with acute OT services to increase level of function and safety to return home  Follow Up Recommendations  No OT follow up;Supervision - Intermittent    Equipment Recommendations  None recommended by OT    Recommendations for Other Services      Precautions / Restrictions Precautions Precautions: Fall Restrictions Weight Bearing Restrictions: No       Mobility Bed Mobility               General bed mobility comments: pt up in recliner  Transfers Overall transfer level: Needs assistance Equipment used: Rolling walker (2 wheeled)   Sit to Stand: Min guard;Supervision         General transfer comment: Incr time and effort but no physical assist needed.    Balance Overall balance assessment: Needs assistance Sitting-balance support: Feet supported Sitting balance-Leahy Scale: Good     Standing balance support: Single extremity supported;During functional activity Standing balance-Leahy Scale: Fair                     ADL Overall ADL's : Needs assistance/impaired     Grooming: Wash/dry hands;Wash/dry face;Brushing hair;Standing;Oral  care;Supervision/safety;Set up   Upper Body Bathing: Set up;Supervision/ safety;Standing   Lower Body Bathing: Min guard;Sit to/from stand;Supervison/ safety;Set up   Upper Body Dressing : Set up;Supervision/safety;Standing   Lower Body Dressing: Min guard;Sit to/from stand;Set up;Supervision/safety   Toilet Transfer: Min guard;Ambulation;Comfort height toilet;Supervision/safety;RW   Toileting- Clothing Manipulation and Hygiene: Sit to/from stand;Supervision/safety   Tub/ Engineer, structural: Min guard;3 in 1   Functional mobility during ADLs: Min guard;Rolling walker;Supervision/safety        Vision  no change from baseline                   Perception Perception Perception Tested?: No   Praxis Praxis Praxis tested?: Not tested    Cognition   Behavior During Therapy: WFL for tasks assessed/performed Overall Cognitive Status: Within Functional Limits for tasks assessed                                                 General Comments  Pt very pleasant and cooperative    Pertinent Vitals/ Pain       Pain Assessment: 0-10 Pain Score: 2  Pain Location: L LE Pain Descriptors / Indicators: Sore Pain Intervention(s): Monitored during session  Frequency Min 2X/week     Progress Toward Goals  OT Goals(current goals can now be found in the care plan section)  Progress towards OT goals: Progressing toward goals  Acute Rehab OT Goals Patient Stated Goal: Return home  Plan Discharge plan remains appropriate                     End of Session Equipment Utilized During Treatment: Rolling walker;Other (comment) (3 in 1)   Activity Tolerance Patient tolerated treatment well   Patient Left in chair;with call bell/phone within reach   Nurse Communication          Time:  -     Charges: OT General Charges $OT Visit: 1 Procedure OT Treatments $Self  Care/Home Management : 23-37 mins   Galen Manila 11/05/2013, 12:38 PM

## 2013-11-05 NOTE — Progress Notes (Signed)
Physical Therapy Treatment Patient Details Name: [name] [name] MRN: [id number] DOB: April 04, 1945 Today's Date: 11/05/2013    History of Present Illness Patient s/p placement of thrombolytic catheter L leg with TNKase and heparin infusiing on 10/23/13. Pt with sudden left groin hematoma formation and pallor and shock on 9/12. Pt intubated. Pt extubated 9/14. Pt underwent Left femoral to posterior tibial bypass graft with ipsilateral non-reversed translocated greater saphenous vein and thrombectomy of left posterior tibial artery on 9/17. On 9/18 pt taken back to surgery emergently for occluded left femoral posterior tibial bypass and underwent thrombectomy and administration of nitroglycerin and TPA .Pt with history of AAA, CAD, MI, HTN.    PT Comments    Pt reports he walked 4x yesterday with family.  Continued education on proper form for normalizing gait pattern.  Follow Up Recommendations  Home health PT;Supervision for mobility/OOB     Equipment Recommendations  None recommended by PT    Recommendations for Other Services       Precautions / Restrictions Precautions Precautions: Fall Restrictions Weight Bearing Restrictions: No    Mobility  Bed Mobility Overal bed mobility: Modified Independent Bed Mobility: Supine to Sit     Supine to sit: HOB elevated;Modified independent (Device/Increase time)     General bed mobility comments: pt up in recliner  Transfers Overall transfer level: Needs assistance Equipment used: Rolling walker (2 wheeled) Transfers: Sit to/from Stand Sit to Stand: Min guard;Supervision         General transfer comment: Incr time and effort but no physical assist needed.  Ambulation/Gait Ambulation/Gait assistance: Min guard;Supervision Ambulation Distance (Feet): 300 Feet Assistive device: Rolling walker (2 wheeled) Gait Pattern/deviations: Step-through pattern;Trunk flexed;Decreased stance time - left     General Gait Details:  Educated on increasing step length and normalizing gait pattern.  RW too low and offered to adjust, but pt said it was fine the way it was.  Educated on how to measure for home RW for proper positioning.   Stairs            Wheelchair Mobility    Modified Rankin (Stroke Patients Only)       Balance Overall balance assessment: Needs assistance Sitting-balance support: Feet supported Sitting balance-Leahy Scale: Good     Standing balance support: Single extremity supported;During functional activity Standing balance-Leahy Scale: Fair                      Cognition Arousal/Alertness: Awake/alert Behavior During Therapy: WFL for tasks assessed/performed Overall Cognitive Status: Within Functional Limits for tasks assessed                      Exercises      General Comments        Pertinent Vitals/Pain Pain Assessment: 0-10 Pain Score: 2  Pain Location: L LE Pain Descriptors / Indicators: Sore Pain Intervention(s): Monitored during session    Home Living                      Prior Function            PT Goals (current goals can now be found in the care plan section) Acute Rehab PT Goals Patient Stated Goal: Return home Potential to Achieve Goals: Good Progress towards PT goals: Progressing toward goals    Frequency  Min 3X/week    PT Plan Current plan remains appropriate    Co-evaluation  End of Session Equipment Utilized During Treatment: Gait belt Activity Tolerance: Patient tolerated treatment well Patient left: in chair;with call bell/phone within reach     Time: 0953-1010 PT Time Calculation (min): 17 min  Charges:  $Gait Training: 8-22 mins                    G Codes:      Cordarrel Stiefel LUBECK 11/05/2013, 12:39 PM

## 2013-11-05 NOTE — Progress Notes (Signed)
      Patient does not qualify for xarelto cover on his insurance- he will go home on Coumadin 5 mg po daily until next INR check with his primary care physician.  Yakir Wenke MAUREEN PA-C

## 2013-11-05 NOTE — Discharge Summary (Signed)
Discharge Summary     [name] [name] 1945-03-19 68 y.o. male  [id number]  Admission Date: 10/23/2013  Discharge Date: 11/05/13  Physician: Nada Libman, MD  Admission Diagnosis: Ischemia [I99.8]   HPI:   This is a 68 y.o. male who returns today with left calf puncture wound from a thorny plant 6 days ago. Less pain in left calf today than 6 days ago, still has numbness in left foot. Dr. [name] checked pt's leg, he had concerns re the left cold foot with blanching.  Pt denies fever or chills.  He is status post endovascular repair of abdominal aortic aneurysm in October of 2011. He has a known persistent type II endoleak from a patent lumbar artery, however the size of his aneurysm sac continues to decrease. 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  He denies abdominal or back pain.  He had a tooth extracted recently, he stopped the Plavix for this and did not resume, he does take a daily ASA, daily statin, and beta blocker.  He had an MI in 2007 with cardiac stent placement.  He denies any history of stroke or TIA.   He was evaluated by [place] on 9/9 and LLE arteriogram on 9/10 findings concerning for popliteal occlusion proximal to stent. Vascular has requested IR to perform arteriogram and possible thrombolysis.  Hospital Course:  The pt was admitted to the hospital and taken to the PVL and underwent: 1. Left common femoral artery cannulation under ultrasound guidance  2. Left leg runoff Findings were as follows:  FINDING(S):   Left   CFA  patent   SFA  Patent until mid-segment where it occludes 5 cm proximal to popliteal stent   PFA  Patent   Pop  Occluded   Trif  Occluded   AT  Occluded   Pero  Occluded   PT  Collateral perfuse distal posterior tibial artery but no flow to foot visualized    He was then referred to IR for thrombolysis.   That afternoon, he did have some evidence of active lysis and the left  calf was warm and pink.  The left foot remained ischemic, but pain had improved.   The next day, he developed a sudden left groin hematoma and formation of pallor and shock.  He developed bradycardia with near loss of pulse.  The TKNase and heparin as well as his antihypertensives were discontinued.  He was placed on fluid bolus and neo.  He was transfused 2 units of PRBC's and 4 packs of cryoprecipitate on 10/24/13. He was also intubated.  He continued to require significant amount of pressor support and no active bleeding and successful reversal of thrombolytic activity.    He did have a bump in his WBC after bradycardic/shock event.  A cardiology consult was obtained.  His cardiac enzymes were negative for MI.  EP consult was recommended if symptomatic bradycardia continued once he recovered from event.  On 10/26/13, he beta blocker was restarted by cardiology to prevent rebound tachycardia/HTN.    A 2D echo was obtained on 10/26/13 with the following results: Study Conclusions  - Left ventricle: The cavity size was normal. Wall thickness was increased in a pattern of moderate LVH. Systolic function was normal. The estimated ejection fraction was in the range of 55% to 60%. Wall motion was normal; there were no regional wall motion abnormalities. Doppler parameters are consistent with abnormal left ventricular relaxation (grade 1 diastolic  dysfunction). The E/e&' ratio is between 8-15, suggesting indeterminate LV filling pressure. - Left atrium: The atrium was normal in size.  Impressions:  - LVEF 55-60%, normal wall motion, moderate LVH, normal LA size, diastolic dysfunction, indeterminate LV filling pressure.  Pt was extubated on 10/26/13.  On cardiology's visit on 10/27/13, it was felt this was likely vasovagal and he had no consistent bradycardia.  On 10/27/13, Dr. Myra Gianotti discussed with the pt that he felt an intervention was warranted given the degree of ischemia to his foot.     On 10/29/13, he was taken to the operating room on 10/23/2013 and underwent: 1: Left femoral to posterior tibial bypass graft with ipsilateral non-reversed translocated greater saphenous vein  #2: Thrombectomy of left posterior tibial artery  #3: Ligation of left below knee popliteal artery    The pt tolerated the procedure well and was transported to the PACU in good condition.   On POD 1, he did not have any doppler flow in his foot and he returned to the OR emergently for thrombectomy.   On 10/30/13, he returned to the OR and underwent: #1: Thrombectomy of left femoral to posterior tibial artery bypass graft  #2: Reexploration of the left common femoral and left posterior tibial artery  #3: Exposure of the posterior tibial artery at the ankle with vein patch angioplasty  #4: Intra-arterial administration of nitroglycerin and TPA  #5: Left lower extremity angiogram  By POD 1, he did have audible PT and DP signal and his foot was warm. He was transferred to telemetry floor.  His foley catheter and his JP drain were discontinued.  By POD 2, he was having some difficulty with urinary incontinence and a condom catheter was placed.   He was also on a heparin gtt.  On POD 3, doppler flow could not be obtained.  An arterial duplex of the graft was obtained to determine patency.   Results as follows: Left Lower Extremity Arterial Duplex has been completed.  Study was very technically difficult and limited due to extensive edema and bruising.  Unable to visualize the proximal and distal anastomoses, left common femoral artery, left and proximal femoral artery due to technical limitations.  Unable to discern the graft from the native vessels in the thigh; there is monophasic flow visualized in the proximal thigh, with dampened monophasic flow in the mid to distal thigh. There is monophasic flow within a superficial vessel, possibly a collateral, which courses from the distal thigh to the distal knee  area. There is monophasic flow noted in the mid posterior tibial artery.  Unable to visualize flow within the mid-distal posterior tibial artery, dorsalis pedis artery, and peroneal artery.   He was able to ambulate, but still has discomfort in his leg.  Dr. Myra Gianotti was able to obtain a monophasic signal in the PT and the DP.  There is a more brisk signal around the distal upper thigh and one, which correlated with the level of the popliteal artery.    The pt continued to do well.  By POD 6, the plan was to start Xarelto and discharge the pt pt home.  Unfortunately, this medication is not covered by his insurance, but it would cover coumadin.  He is bridged with Lovenox while in the hospital as his insurance company will not cover the lovenox bridging for home.  He will continue his hospitalization until his INR is therapeutic.  It took some time to get his INR therapeutic.  An increase was anticipated  on 11/16/13, with the upward trend, but decreased.   His INR will need to range around 2.5-3.0.  INR Trends: DATE COUMADIN DOSE INR  11/05/13    11/06/13  1.27  11/07/13  1.12  11/08/13 7.5mg  1.23  11/09/13  1.06  11/10/13  1.23  11/11/13  1.22  11/12/13  1.31  11/13/13  1.49  11/14/13  1.76  11/15/13  2.21  11/16/13  2.06   On 11/14/13, his left 4th toe developed ischemic changes.  We will have to wait for this to demarcate.  He had bumped his toe earlier in the week and developed a small sore on this toe.  Dry gauze are being placed b/w these toes.  The remainder of the hospital course consisted of increasing mobilization and increasing intake of solids without difficulty.  CBC    Component Value Date/Time   WBC 5.6 11/16/2013 0355   RBC 3.84* 11/16/2013 0355   HGB 12.0* 11/16/2013 0355   HCT 35.3* 11/16/2013 0355   PLT 344 11/16/2013 0355   MCV 91.9 11/16/2013 0355   MCH 31.3 11/16/2013 0355   MCHC 34.0 11/16/2013 0355   RDW 13.9 11/16/2013 0355   LYMPHSABS 1.6  10/24/2013 0635   MONOABS 1.0 10/24/2013 0635   EOSABS 0.0 10/24/2013 0635   BASOSABS 0.0 10/24/2013 0635    BMET    Component Value Date/Time   NA 142 11/03/2013 0541   K 3.9 11/03/2013 0541   CL 110 11/03/2013 0541   CO2 21 11/03/2013 0541   GLUCOSE 100* 11/03/2013 0541   BUN 13 11/03/2013 0541   CREATININE 0.95 11/03/2013 0541   CALCIUM 8.3* 11/03/2013 0541   GFRNONAA 82* 11/03/2013 0541   GFRAA >90 11/03/2013 0541     Discharge Instructions:   The patient is discharged with extensive instructions on wound care and progressive ambulation.  They are instructed not to drive or perform any heavy lifting until returning to see the physician in his office.      Discharge Instructions   Call MD for:  redness, tenderness, or signs of infection (pain, swelling, bleeding, redness, odor or green/yellow discharge around incision site)    Complete by:  As directed      Call MD for:  severe or increased pain, loss or decreased feeling  in affected limb(s)    Complete by:  As directed      Call MD for:  temperature >100.5    Complete by:  As directed      Discharge wound care:    Complete by:  As directed   Wash the groin wound with soap and water daily and pat dry. (No tub bath-only shower)  Then put a dry gauze or washcloth there to keep this area dry daily and as needed.  Do not use Vaseline or neosporin on your incisions.  Only use soap and water on your incisions and then protect and keep dry.     Driving Restrictions    Complete by:  As directed   No driving for 2 weeks     Lifting restrictions    Complete by:  As directed   No lifting for 4 weeks     Resume previous diet    Complete by:  As directed            Discharge Diagnosis:  Ischemia [I99.8]  Secondary Diagnosis: Patient Active Problem List   Diagnosis Date Noted  . PAD (peripheral artery disease) 10/29/2013  . Bradycardia 10/25/2013  . Lower  limb ischemia 10/23/2013  . Popliteal artery occlusion, left 10/23/2013  .  Redness of skin-Left leg/foot 10/21/2013  . Paresthesia of left foot 10/21/2013  . Sensation of cold in lower extremity-Left foot 10/21/2013  . Pain of left lower leg 10/21/2013  . PVD (peripheral vascular disease) 08/03/2013  . AAA (abdominal aortic aneurysm) 06/23/2013  . HTN (hypertension) 06/23/2013  . Popliteal aneurysm 07/28/2012  . Aneurysm of artery of lower extremity 12/03/2011  . Leaking abdominal aortic aneurysm 05/28/2011  . HYPERLIPIDEMIA-MIXED 10/11/2009  . CORONARY ATHEROSCLEROSIS NATIVE CORONARY ARTERY 10/11/2009  . ABDOMINAL BRUIT 10/11/2009  . OTHER DYSPNEA AND RESPIRATORY ABNORMALITIES 10/11/2009   Past Medical History  Diagnosis Date  . Abdominal aortic aneurysm   . Popliteal aneurysm   . Hyperlipidemia   . Hypertension   . Coronary artery disease   . Detached retina   . Myocardial infarction     2007 heart stent  . Chronic kidney disease     Right kidney stone       Medication List    STOP taking these medications       lisinopril 20 MG tablet  Commonly known as:  PRINIVIL,ZESTRIL      TAKE these medications       aspirin 81 MG EC tablet  Take 81 mg by mouth daily.     atorvastatin 40 MG tablet  Commonly known as:  LIPITOR  Take 40 mg by mouth daily.     FISH OIL TRIPLE STRENGTH 1400 MG Caps  Take 2,800 mg by mouth 2 (two) times daily.     metoprolol tartrate 25 MG tablet  Commonly known as:  LOPRESSOR  Take 25 mg by mouth 2 (two) times daily.     oxyCODONE 5 MG immediate release tablet  Commonly known as:  ROXICODONE  Take 1 tablet (5 mg total) by mouth every 6 (six) hours as needed.     Saw Palmetto 450 MG Caps  Take 900 mg by mouth 2 (two) times daily.     warfarin 10 MG tablet  Commonly known as:  COUMADIN  Take 1 tablet (10 mg total) by mouth daily.     warfarin 2.5 MG tablet  Commonly known as:  COUMADIN  Take 1 tablet (2.5 mg total) by mouth daily.       Refill information and Medication notes: -Roxicodone #30 No  Refill -Coumadin 10mg  #30 2RF given -Coumadin 2.5mg  #30 2RF given (Pt is to take coumadin 12.5 mg daily.  Dose may vary pending INR)  -Pt's lisinopril was not restarted as his systolic pressures have been 100's-110's   Disposition: home  Patient's condition: is Good  Follow up: 1. Dr. Myra Gianotti November 23, 2013 @ 3:45pm 2. Dortha Kern, [place] November 17, 2013 @ 1:30pm - at that time, he will have INR checked and coumadin adjusted as needed.    INR Trends: DATE COUMADIN DOSE INR  11/05/13 5mg    11/06/13 5mg  1.27  11/07/13 5mg  1.12  11/08/13 7.5mg  1.23  11/09/13 10mg  1.06  11/10/13 10mg  1.23  11/11/13 15mg  1.22  11/12/13 15mg  1.31  11/13/13 15mg  1.49  11/14/13 15mg  1.76  11/15/13 10mg  2.21  11/16/13  2.06    Doreatha Massed, PA-C [facility] [phone] 11/16/2013  10:07 AM    - For VQI Registry use --- Post-op:  Wound infection: No  Graft infection: No  Transfusion: Yes  If yes, 2 PRBCs and 4 cryoprecipitate units given New Arrhythmia: No Ipsilateral amputation: No, [ ]  Minor, [ ]   BKA,  AKA Discharge patency:  Primary, [ x] Primary assisted,  Secondary,  Occluded Patency judged by: [x ] Dopper only,  Palpable graft pulse,  Palpable distal pulse,  ABI inc. > 0.15,  Duplex Discharge ABI: R not done, L not done Discharge TBI: R , L  D/C Ambulatory Status: Ambulatory with Assistance-with rolling walker  Complications: MI: No,  Troponin only,  EKG or Clinical CHF: No Resp failure:No,  Pneumonia,  Ventilator Chg in renal function: No,  Inc. Cr > 0.5,  Temp. Dialysis,  Permanent dialysis Stroke: No,  Minor,  Major Return to OR: Yes  Reason for return to OR:  Bleeding,  Infection, [x ] Thrombosis,  Revision  Discharge medications: Statin use:  yes ASA use:  yes Plavix use:  no Beta blocker use: yes Coumadin use: yes Xarelto:  No

## 2013-11-05 NOTE — Progress Notes (Addendum)
ANTICOAGULATION CONSULT NOTE - Follow Up Consult  Pharmacy Consult for heparin Indication: LLE ischemia s/p arteriogram and thrombectomy  Allergies  Allergen Reactions  . Omnipaque [Iohexol] Rash    Pt requires full premeds and needs to bring a driver.  Broke out in rash on back and arms.  Ok w/ premeds  . Oxytetracycline Rash    Terramycin causes rash    Patient Measurements: Height: 6' 2.02" (188 cm) Weight: 196 lb 1.6 oz (88.95 kg) IBW/kg (Calculated) : 82.24 Heparin Dosing Weight: 89kg  Vital Signs: Temp: 98 F (36.7 C) (09/24 0700) Temp src: Oral (09/24 0700) BP: 117/69 mmHg (09/24 0700) Pulse Rate: 71 (09/24 0700)  Labs:  Recent Labs  11/03/13 0541  11/04/13 0047 11/04/13 0950 11/04/13 1841 11/05/13 0554  HGB 9.5*  --  9.6*  --   --  9.9*  HCT 27.6*  --  28.5*  --   --  29.6*  PLT 183  --  182  --   --  147*  APTT 43*  --   --   --   --   --   HEPARINUNFRC <0.10*  < > 0.11* 0.10* 0.16* 0.65  CREATININE 0.95  --   --   --   --   --   < > = values in this interval not displayed.  Estimated Creatinine Clearance: 84.1 ml/min (by C-G formula based on Cr of 0.95).   Assessment: 66 YOM s/p arteriogram + thrombectomy on 9/11, anticoagulation had to be held d/t hematomas and shock. Resumed and currently on IV heparin with a therapeutic level this morning of 0.65 units/mL. Spoke with vascular team this morning and to transition to Xarelto. Renal function good. Since he did have occlusion of graft, will treat as new clot. RN made aware of plan below. CBC stable, no bleeding noted.  Goal of Therapy:  Heparin level 0.3-0.7 units/ml Monitor platelets by anticoagulation protocol: Yes   Plan:   1. Stop heparin at 1130 2. Start Xarelto  BID qmeals x21 days with first dose at lunchtime today. After 21 days (on 11/26/2013), start Xarelto  qsupper 3. Will counsel patient. Instructions left on d/c paperwork  Lauren D. Bajbus, PharmD, BCPS Clinical  Pharmacist Pager: [phone] 11/05/2013 9:47 AM   Addendum:  Notified that Xarelto is not covered by patient's insurance. [facility] has consulted pharmacy to transition patient to Lovenox and Coumadin bridge for graft occlusion/new clot (see discussion above) - recommend to continue Lovenox for at least 5 days and until INR > 2 x 24hrs per guidelines. Patient did receive Xarelto  ~1300 today - will delay Lovenox/Coumadin initiation until later tonight. - H/H improving, Plts trending down - monitor - No significant bleeding reported - Wt 89kg, Crcl 84 - Last INR 1.15 (9/17) - will not order another tonight as Xarelto will falsely elevate (may affect 9/25 AM INR as well) - Warfarin points 5  Goal: INR 2-3 Anti-Xa level 0.6-1 units/ml 4hrs after LMWH dose given  Plan: 1. Discontinue Xarelto 2. Lovenox  (~1mg /kg) SQ q12h - first dose tonight 3. Coumadin  PO x 1 today 4. Daily INR, CBC q72h  Wilfred Lacy, PharmD Clinical Pharmacist [phone] 11/05/2013, 3:44 PM

## 2013-11-05 NOTE — Telephone Encounter (Addendum)
Message copied by Rosalyn Charters on Thu Nov 05, 2013 11:59 AM ------      Message from: Lorin Mercy K      Created: Thu Nov 05, 2013  9:13 AM      Regarding: Schedule                   ----- Message -----         From: Dara Lords, PA-C         Sent: 11/05/2013   7:51 AM           To: Vvs Charge Pool            S/p       1: Left femoral to posterior tibial bypass graft with ipsilateral non-reversed translocated greater saphenous vein        #2: Thrombectomy of left posterior tibial artery        #3: Ligation of left below knee popliteal artery        7 Days Post        #1: Thrombectomy of left femoral to posterior tibial artery bypass graft        #2: Reexploration of the left common femoral and left posterior tibial artery        #3: Exposure of the posterior tibial artery at the ankle with vein patch angioplasty        #4: Intra-arterial administration of nitroglycerin and TPA        #5: Left lower extremity angiogram        6 Days Post-Op              F/u with Dr. Myra Gianotti in 2 weeks.            Thanks ------  notified patient of post op appt. with dr. Myra Gianotti on 11-23-13 3:45

## 2013-11-06 LAB — CBC
HCT: 31.4 % — ABNORMAL LOW (ref 39.0–52.0)
Hemoglobin: 10.5 g/dL — ABNORMAL LOW (ref 13.0–17.0)
MCH: 31.3 pg (ref 26.0–34.0)
MCHC: 33.4 g/dL (ref 30.0–36.0)
MCV: 93.7 fL (ref 78.0–100.0)
PLATELETS: 163 10*3/uL (ref 150–400)
RBC: 3.35 MIL/uL — ABNORMAL LOW (ref 4.22–5.81)
RDW: 14.9 % (ref 11.5–15.5)
WBC: 6.4 10*3/uL (ref 4.0–10.5)

## 2013-11-06 LAB — PROTIME-INR
INR: 1.27 (ref 0.00–1.49)
PROTHROMBIN TIME: 15.9 s — AB (ref 11.6–15.2)

## 2013-11-06 MED ORDER — WARFARIN SODIUM 5 MG PO TABS
5.0000 mg | ORAL_TABLET | Freq: Once | ORAL | Status: AC
  Administered 2013-11-06: 5 mg via ORAL
  Filled 2013-11-06: qty 1

## 2013-11-06 NOTE — Progress Notes (Signed)
ANTICOAGULATION CONSULT NOTE - Follow Up Consult  Pharmacy Consult for Lovenox and Coumadin Indication: LLE ischemia s/p arteriogram and thrombectomy  Allergies  Allergen Reactions  . Omnipaque [Iohexol] Rash    Pt requires full premeds and needs to bring a driver.  Broke out in rash on back and arms.  Ok w/ premeds  . Oxytetracycline Rash    Terramycin causes rash    Patient Measurements: Height: 6' 2.02" (188 cm) Weight: 195 lb 5.2 oz (88.6 kg) IBW/kg (Calculated) : 82.24  Vital Signs: Temp: 98.3 F (36.8 C) (09/25 0431) Temp src: Oral (09/25 0431) BP: 114/67 mmHg (09/25 0431) Pulse Rate: 81 (09/25 0431)  Labs:  Recent Labs  11/04/13 0047 11/04/13 0950 11/04/13 1841 11/05/13 0554 11/06/13 0650  HGB 9.6*  --   --  9.9* 10.5*  HCT 28.5*  --   --  29.6* 31.4*  PLT 182  --   --  147* 163  LABPROT  --   --   --   --  15.9*  INR  --   --   --   --  1.27  HEPARINUNFRC 0.11* 0.10* 0.16* 0.65  --     Estimated Creatinine Clearance: 84.1 ml/min (by C-G formula based on Cr of 0.95).   Assessment: 44 YOM s/p arteriogram + thrombectomy on 9/11, anticoagulation had to be held d/t hematomas and shock. Lovenox and Coumadin bridge for graft occlusion/new clot - recommend to continue Lovenox for at least 5 days and until INR > 2 x 24hrs per guidelines. CBC stable, no bleeding noted.  Goal of Therapy:  Heparin level 0.3-0.7 units/ml Monitor platelets by anticoagulation protocol: Yes   Plan:   Lovenox  (~1mg /kg) SQ q12h  Coumadin  PO x 1 today Daily INR, CBC q72h  Estella Husk, Pharm.D., BCPS, AAHIVP Clinical Pharmacist Phone: [phone] or [phone] 11/06/2013, 9:14 AM

## 2013-11-06 NOTE — Progress Notes (Addendum)
     Subjective  - Patient will stay until the INR is therapeutic.     Objective 114/67 81 98.3 F (36.8 C) (Oral) 20 98%  Intake/Output Summary (Last 24 hours) at 11/06/13 1105 Last data filed at 11/06/13 0900  Gross per 24 hour  Intake    828 ml  Output   3925 ml  Net  -3097 ml   Left peroneal doppler signal  Incisions healing    Assessment/Planning: POD #14  1: Left femoral to posterior tibial bypass graft with ipsilateral non-reversed translocated greater saphenous vein  #2: Thrombectomy of left posterior tibial artery  #3: Ligation of left below knee popliteal artery  7 Days Post  #1: Thrombectomy of left femoral to posterior tibial artery bypass graft  #2: Reexploration of the left common femoral and left posterior tibial artery  #3: Exposure of the posterior tibial artery at the ankle with vein patch angioplasty  #4: Intra-arterial administration of nitroglycerin and TPA  #5: Left lower extremity angiogram  6 Days Post-Op   Coumadin and therapeutic Lovenox He needs op note and D/C summary to hand deliver to his primary care.  He has a follow up for INR check Tuesday of this coming week.  Clinton Gallant [facility] 11/06/2013 11:05 AM --  Laboratory Lab Results:  Recent Labs  11/05/13 0554 11/06/13 0650  WBC 8.5 6.4  HGB 9.9* 10.5*  HCT 29.6* 31.4*  PLT 147* 163   BMET No results found for this basename: NA, K, CL, CO2, GLUCOSE, BUN, CREATININE, CALCIUM,  in the last 72 hours  COAG Lab Results  Component Value Date   INR 1.27 11/06/2013   INR 1.15 10/29/2013   INR 1.48 10/24/2013   No results found for this basename: PTT      Doing well.  Will need to stay until INR theraputic  [name] [name]

## 2013-11-07 LAB — PROTIME-INR
INR: 1.12 (ref 0.00–1.49)
Prothrombin Time: 14.4 seconds (ref 11.6–15.2)

## 2013-11-07 LAB — CBC
HCT: 30.9 % — ABNORMAL LOW (ref 39.0–52.0)
Hemoglobin: 10.3 g/dL — ABNORMAL LOW (ref 13.0–17.0)
MCH: 31.9 pg (ref 26.0–34.0)
MCHC: 33.3 g/dL (ref 30.0–36.0)
MCV: 95.7 fL (ref 78.0–100.0)
PLATELETS: 181 10*3/uL (ref 150–400)
RBC: 3.23 MIL/uL — ABNORMAL LOW (ref 4.22–5.81)
RDW: 14.8 % (ref 11.5–15.5)
WBC: 7.1 10*3/uL (ref 4.0–10.5)

## 2013-11-07 MED ORDER — WARFARIN SODIUM 5 MG PO TABS
5.0000 mg | ORAL_TABLET | Freq: Once | ORAL | Status: AC
  Administered 2013-11-07: 5 mg via ORAL
  Filled 2013-11-07: qty 1

## 2013-11-07 NOTE — Progress Notes (Signed)
ANTICOAGULATION CONSULT NOTE - Follow Up Consult  Pharmacy Consult for Lovenox and Coumadin Indication: LLE ischemia s/p arteriogram and thrombectomy  Allergies  Allergen Reactions  . Omnipaque [Iohexol] Rash    Pt requires full premeds and needs to bring a driver.  Broke out in rash on back and arms.  Ok w/ premeds  . Oxytetracycline Rash    Terramycin causes rash    Patient Measurements: Height: 6' 2.02" (188 cm) Weight: 195 lb 5.2 oz (88.6 kg) IBW/kg (Calculated) : 82.24  Vital Signs: Temp: 98.2 F (36.8 C) (09/26 0428) Temp src: Oral (09/26 0428) BP: 98/58 mmHg (09/26 1135) Pulse Rate: 87 (09/26 1135)  Labs:  Recent Labs  11/04/13 1841  11/05/13 0554 11/06/13 0650 11/07/13 0415  HGB  --   < > 9.9* 10.5* 10.3*  HCT  --   --  29.6* 31.4* 30.9*  PLT  --   --  147* 163 181  LABPROT  --   --   --  15.9* 14.4  INR  --   --   --  1.27 1.12  HEPARINUNFRC 0.16*  --  0.65  --   --   < > = values in this interval not displayed.  Estimated Creatinine Clearance: 84.1 ml/min (by C-G formula based on Cr of 0.95).   Assessment: 59 YOM s/p arteriogram + thrombectomy on 9/11, anticoagulation had to be held d/t hematomas and shock. Lovenox and Coumadin bridge for graft occlusion/new clot - recommend to continue Lovenox for at least 5 days and until INR > 2 x 24hrs per guidelines. His INR today is 1.12 and is slightly decreased.  Yesterday's INR was likely influenced by a dose of Xarelto he received on 9/24. CBC stable, no bleeding noted.  Goal of Therapy:  INR 2-3 Monitor platelets by anticoagulation protocol: Yes   Plan:   Lovenox  (~1mg /kg) SQ q12h  Coumadin  PO x 1 today Daily INR, CBC q72h  Estella Husk, Pharm.D., BCPS, AAHIVP Clinical Pharmacist Phone: [phone] or [phone] 11/07/2013, 12:56 PM

## 2013-11-07 NOTE — Significant Event (Signed)
Patient ambulated with staff using walking, on room air. Ambulated approximately 600 feet. Returned to room and settled in chair. Will continue to monitor. Aymee Fomby, Charity fundraiser.

## 2013-11-07 NOTE — Progress Notes (Addendum)
  [facility] Progress Note  11/07/2013 9:54 AM 15 Days Post-Op  Subjective:  Doing well. No complaints. Has been ambulating.    Filed Vitals:   11/07/13 0428  BP: 113/61  Pulse: 69  Temp: 98.2 F (36.8 C)  Resp: 18    Physical Exam: Incisions:  Left incisions clean dry and intact Extremities:  + peroneal doppler signal  CBC    Component Value Date/Time   WBC 7.1 11/07/2013 0415   RBC 3.23* 11/07/2013 0415   HGB 10.3* 11/07/2013 0415   HCT 30.9* 11/07/2013 0415   PLT 181 11/07/2013 0415   MCV 95.7 11/07/2013 0415   MCH 31.9 11/07/2013 0415   MCHC 33.3 11/07/2013 0415   RDW 14.8 11/07/2013 0415   LYMPHSABS 1.6 10/24/2013 0635   MONOABS 1.0 10/24/2013 0635   EOSABS 0.0 10/24/2013 0635   BASOSABS 0.0 10/24/2013 0635    BMET    Component Value Date/Time   NA 142 11/03/2013 0541   K 3.9 11/03/2013 0541   CL 110 11/03/2013 0541   CO2 21 11/03/2013 0541   GLUCOSE 100* 11/03/2013 0541   BUN 13 11/03/2013 0541   CREATININE 0.95 11/03/2013 0541   CALCIUM 8.3* 11/03/2013 0541   GFRNONAA 82* 11/03/2013 0541   GFRAA >90 11/03/2013 0541    INR    Component Value Date/Time   INR 1.12 11/07/2013 0415     Intake/Output Summary (Last 24 hours) at 11/07/13 0954 Last data filed at 11/07/13 0900  Gross per 24 hour  Intake    360 ml  Output    300 ml  Net     60 ml     Assessment:  68 y.o. male is s/p:  #1 Left femoral to posterior tibial bypass graft with ipsilateral non-reversed translocated greater saphenous vein  #2: Thrombectomy of left posterior tibial artery  #3: Ligation of left below knee popliteal artery  8 Days Post-Op  #1: Thrombectomy of left femoral to posterior tibial artery bypass graft  #2: Reexploration of the left common femoral and left posterior tibial artery  #3: Exposure of the posterior tibial artery at the ankle with vein patch angioplasty  #4: Intra-arterial administration of nitroglycerin and TPA  #5: Left lower extremity angiogram    7Days Post-Op     Plan: -Will discharge when coumadin is therapeutic. INR today 1.12  Maris Berger, PA-C [facility] Office: [phone] Pager: [phone] 11/07/2013 9:54 AM  INR equals 1.12 Patient on Lovenox until Coumadin therapeutic Bypass appears patent with good Doppler flow in pretibial region

## 2013-11-08 LAB — CBC
HEMATOCRIT: 32.4 % — AB (ref 39.0–52.0)
HEMOGLOBIN: 10.7 g/dL — AB (ref 13.0–17.0)
MCH: 31.2 pg (ref 26.0–34.0)
MCHC: 33 g/dL (ref 30.0–36.0)
MCV: 94.5 fL (ref 78.0–100.0)
Platelets: 213 10*3/uL (ref 150–400)
RBC: 3.43 MIL/uL — AB (ref 4.22–5.81)
RDW: 14.9 % (ref 11.5–15.5)
WBC: 6 10*3/uL (ref 4.0–10.5)

## 2013-11-08 LAB — PROTIME-INR
INR: 1.23 (ref 0.00–1.49)
PROTHROMBIN TIME: 15.5 s — AB (ref 11.6–15.2)

## 2013-11-08 MED ORDER — WARFARIN SODIUM 7.5 MG PO TABS
7.5000 mg | ORAL_TABLET | Freq: Once | ORAL | Status: AC
  Administered 2013-11-08: 7.5 mg via ORAL
  Filled 2013-11-08 (×2): qty 1

## 2013-11-08 NOTE — Progress Notes (Addendum)
  [facility] Progress Note  11/08/2013 10:24 AM 16 Days Post-Op  No complaints today. Unchanged from yesterday. Will d/c when INR is therapeutic. 1.23 today.    Maris Berger, PA-C [facility] Office: [phone] Pager: [phone] 11/08/2013 10:24 AM No change today Awaiting INR greater than 2.0-currently on Lovenox and left foot remains asymptomatic

## 2013-11-08 NOTE — Progress Notes (Signed)
Pt ambulated in hall 300 feet with front wheel walker with minimal assist. Pt tolerated well.

## 2013-11-08 NOTE — Progress Notes (Signed)
ANTICOAGULATION CONSULT NOTE - Follow Up Consult  Pharmacy Consult for Lovenox and Coumadin Indication: LLE ischemia s/p arteriogram and thrombectomy  Allergies  Allergen Reactions  . Omnipaque [Iohexol] Rash    Pt requires full premeds and needs to bring a driver.  Broke out in rash on back and arms.  Ok w/ premeds  . Oxytetracycline Rash    Terramycin causes rash    Patient Measurements: Height: 6' 2.02" (188 cm) Weight: 195 lb 5.2 oz (88.6 kg) IBW/kg (Calculated) : 82.24  Vital Signs: Temp: 98.1 F (36.7 C) (09/27 0401) Temp src: Oral (09/27 0401) BP: 104/57 mmHg (09/27 0401) Pulse Rate: 68 (09/27 0401)  Labs:  Recent Labs  11/06/13 0650 11/07/13 0415 11/08/13 0358  HGB 10.5* 10.3* 10.7*  HCT 31.4* 30.9* 32.4*  PLT 163 181 213  LABPROT 15.9* 14.4 15.5*  INR 1.27 1.12 1.23    Estimated Creatinine Clearance: 84.1 ml/min (by C-G formula based on Cr of 0.95).   Assessment: 44 YOM s/p arteriogram + thrombectomy on 9/11, anticoagulation had to be held d/t hematomas and shock. Lovenox and Coumadin bridge for graft occlusion/new clot - recommend to continue Lovenox for at least 5 days and until INR > 2 x 24hrs per guidelines. His INR today is 1.23 and is slightly increased but with a slow rise after 3 x  Coumadin doses.   CBC stable, no bleeding noted.  Goal of Therapy:  INR 2-3 Monitor platelets by anticoagulation protocol: Yes   Plan:   Lovenox  (~1mg /kg) SQ q12h  Coumadin 7.5mg  PO x 1 today Daily INR, CBC q72h  Estella Husk, Pharm.D., BCPS, AAHIVP Clinical Pharmacist Phone: [phone] or [phone] 11/08/2013, 1:59 PM

## 2013-11-09 LAB — CBC
HEMATOCRIT: 33.8 % — AB (ref 39.0–52.0)
HEMOGLOBIN: 11 g/dL — AB (ref 13.0–17.0)
MCH: 30.8 pg (ref 26.0–34.0)
MCHC: 32.5 g/dL (ref 30.0–36.0)
MCV: 94.7 fL (ref 78.0–100.0)
Platelets: 259 10*3/uL (ref 150–400)
RBC: 3.57 MIL/uL — AB (ref 4.22–5.81)
RDW: 14.7 % (ref 11.5–15.5)
WBC: 5.5 10*3/uL (ref 4.0–10.5)

## 2013-11-09 LAB — PROTIME-INR
INR: 1.06 (ref 0.00–1.49)
PROTHROMBIN TIME: 13.8 s (ref 11.6–15.2)

## 2013-11-09 MED ORDER — WARFARIN SODIUM 10 MG PO TABS
10.0000 mg | ORAL_TABLET | Freq: Once | ORAL | Status: AC
  Administered 2013-11-09: 10 mg via ORAL
  Filled 2013-11-09: qty 1

## 2013-11-09 NOTE — Progress Notes (Signed)
ANTICOAGULATION CONSULT NOTE - Follow Up Consult  Pharmacy Consult for Lovenox and Coumadin Indication: LLE ischemia s/p arteriogram and thrombectomy  Allergies  Allergen Reactions  . Omnipaque [Iohexol] Rash    Pt requires full premeds and needs to bring a driver.  Broke out in rash on back and arms.  Ok w/ premeds  . Oxytetracycline Rash    Terramycin causes rash    Patient Measurements: Height: 6' 2.02" (188 cm) Weight: 195 lb 5.2 oz (88.6 kg) IBW/kg (Calculated) : 82.24  Vital Signs: Temp: 98.8 F (37.1 C) (09/28 1300) Temp src: Oral (09/28 1300) BP: 100/61 mmHg (09/28 1300) Pulse Rate: 77 (09/28 1300)  Labs:  Recent Labs  11/07/13 0415 11/08/13 0358 11/09/13 0608  HGB 10.3* 10.7* 11.0*  HCT 30.9* 32.4* 33.8*  PLT 181 213 259  LABPROT 14.4 15.5* 13.8  INR 1.12 1.23 1.06    Estimated Creatinine Clearance: 84.1 ml/min (by C-G formula based on Cr of 0.95).   Assessment: 18 YOM s/p arteriogram + thrombectomy on 9/11, anticoagulation had to be held d/t hematomas and shock. Lovenox and Coumadin bridge for graft occlusion/new clot - recommend to continue Lovenox for at least 5 days and until INR > 2 x 24hrs per guidelines.  Transititioned from IV heparin to Xarelto on 9/24, but changed to Coumadin and Lovenox later that day due to Xarelto not covered by insurance.  Has received Coumadin 5 mg daily x 3, then Coumadin 7.5 mg, but INR has trended down to 1.06.  Xarelto likely effected INR to some degree over the previous days.  Goal of Therapy:  INR 2-3 Monitor platelets by anticoagulation protocol: Yes   Plan:    Increase Coumadin to 10 mg x 1 today.  Continue Lovenox 90 mg sq q12hrs until INR >2.  Daily PT/INR; CBC at least every 3 days.  Dennie Fetters, [place] Pager: [phone] 11/09/2013, 3:06 PM

## 2013-11-09 NOTE — Progress Notes (Addendum)
Physical Therapy Treatment Patient Details Name: [name] [name] MRN: [id number] DOB: July 15, 1945 Today's Date: 11/09/2013    History of Present Illness Patient s/p placement of thrombolytic catheter L leg with TNKase and heparin infusiing on 10/23/13. Pt with sudden left groin hematoma formation and pallor and shock on 9/12. Pt intubated. Pt extubated 9/14. Pt underwent Left femoral to posterior tibial bypass graft with ipsilateral non-reversed translocated greater saphenous vein and thrombectomy of left posterior tibial artery on 9/17. On 9/18 pt taken back to surgery emergently for occluded left femoral posterior tibial bypass and underwent thrombectomy and administration of nitroglycerin and TPA .Pt with history of AAA, CAD, MI, HTN.    PT Comments    Pt admitted with above. Pt currently with functional limitations due to balance and endurance deficits but is improving daily. Decreased frequency as pt met goals and goals revised.  Should not need many more treatments to meet inpatient goals.   Multiple staff members are ambulating with pt.  Pt will benefit from skilled PT to increase their independence and safety with mobility to allow discharge to the venue listed below.   Follow Up Recommendations  Home health PT;Supervision for mobility/OOB     Equipment Recommendations  None recommended by PT    Recommendations for Other Services       Precautions / Restrictions Precautions Precautions: Fall Restrictions Weight Bearing Restrictions: No    Mobility  Bed Mobility                  Transfers Overall transfer level: Needs assistance   Transfers: Sit to/from Stand Sit to Stand: Supervision         General transfer comment: Incr time and effort but no physical assist needed.  Ambulation/Gait Ambulation/Gait assistance: Supervision Ambulation Distance (Feet): 450 Feet Assistive device: Rolling walker (2 wheeled) Gait Pattern/deviations: Step-through pattern;Trunk  flexed Gait velocity: slow Gait velocity interpretation: Below normal speed for age/gender General Gait Details: Pt ambulating well overall.  RW too low and pt allowed PT to adjust it today.  Pt with steady gait throughout.  Occasional cues for safety.     Stairs            Wheelchair Mobility    Modified Rankin (Stroke Patients Only)       Balance           Standing balance support: Bilateral upper extremity supported;During functional activity Standing balance-Leahy Scale: Poor Standing balance comment: Needs UE support for static balance for stability.                      Cognition Arousal/Alertness: Awake/alert Behavior During Therapy: Flat affect Overall Cognitive Status: Within Functional Limits for tasks assessed                      Exercises General Exercises - Lower Extremity Ankle Circles/Pumps: AROM;Both;10 reps;Seated Heel Slides: AROM;Both;10 reps;Seated Other Exercises Other Exercises: Showed pt stretch with sheet to stretch heel cord on left.  Pt able to teach back.   Verbal instruction re: standing exercises but pt did not want to perform today.  Hip flexion, hip abduction and toe raises.      General Comments        Pertinent Vitals/Pain Pain Assessment: 0-10 Pain Score: 2  Pain Location: LLE Pain Descriptors / Indicators: Sore Pain Intervention(s): Monitored during session;Repositioned;Premedicated before session VSS    Home Living  Prior Function            PT Goals (current goals can now be found in the care plan section) Progress towards PT goals: Progressing toward goals    Frequency  Min 2X/week    PT Plan Current plan remains appropriate;Frequency needs to be updated    Co-evaluation             End of Session Equipment Utilized During Treatment: Gait belt Activity Tolerance: Patient tolerated treatment well Patient left: in chair;with call bell/phone within reach      Time: 1520-1548 PT Time Calculation (min): 28 min  Charges:  $Gait Training: 8-22 mins $Therapeutic Exercise: 8-22 mins                    G Codes:      [name] [name],[name] [name] November 18, 2013, 4:32 PM [name] [name] [facility] [phone] (pager)

## 2013-11-09 NOTE — Progress Notes (Signed)
  Progress Note    11/09/2013 7:31 AM 17 Days Post-Op  Subjective:  No complaints  afebrile  Filed Vitals:   11/09/13 0404  BP: 109/62  Pulse: 73  Temp: 98.9 F (37.2 C)  Resp: 17    Physical Exam: Cardiac:  regluar Lungs:  Non labored Incisions:  All are healing nicely Extremities:  Left foot is warm; +peroneal doppler signal; +PT signal just above the ankle incision.  CBC    Component Value Date/Time   WBC 5.5 11/09/2013 0608   RBC 3.57* 11/09/2013 0608   HGB 11.0* 11/09/2013 0608   HCT 33.8* 11/09/2013 0608   PLT 259 11/09/2013 0608   MCV 94.7 11/09/2013 0608   MCH 30.8 11/09/2013 0608   MCHC 32.5 11/09/2013 0608   RDW 14.7 11/09/2013 0608   LYMPHSABS 1.6 10/24/2013 0635   MONOABS 1.0 10/24/2013 0635   EOSABS 0.0 10/24/2013 0635   BASOSABS 0.0 10/24/2013 0635    BMET    Component Value Date/Time   NA 142 11/03/2013 0541   K 3.9 11/03/2013 0541   CL 110 11/03/2013 0541   CO2 21 11/03/2013 0541   GLUCOSE 100* 11/03/2013 0541   BUN 13 11/03/2013 0541   CREATININE 0.95 11/03/2013 0541   CALCIUM 8.3* 11/03/2013 0541   GFRNONAA 82* 11/03/2013 0541   GFRAA >90 11/03/2013 0541    INR    Component Value Date/Time   INR 1.06 11/09/2013 0608     Intake/Output Summary (Last 24 hours) at 11/09/13 0731 Last data filed at 11/09/13 0600  Gross per 24 hour  Intake    340 ml  Output   2800 ml  Net  -2460 ml     Assessment:  68 y.o. male is s/p:  1: Left femoral to posterior tibial bypass graft with ipsilateral non-reversed translocated greater saphenous vein  #2: Thrombectomy of left posterior tibial artery  #3: Ligation of left below knee popliteal artery  11 Days Post  #1: Thrombectomy of left femoral to posterior tibial artery bypass graft  #2: Reexploration of the left common femoral and left posterior tibial artery  #3: Exposure of the posterior tibial artery at the ankle with vein patch angioplasty  #4: Intra-arterial administration of nitroglycerin and TPA  #5:  Left lower extremity angiogram  10 Days Post-Op -   Plan: -pt doing well this am without complaints-continues to have same doppler exam as last week. -DVT prophylaxis:  Lovenox bridge to coumadin -INR backsliding today to 1.06 from 1.23--coumadin/Lovenox per pharmacy -pt ambulating in halls--continue increased mobilization -d/c home when INR is therapeutic   Doreatha Massed, PA-C [facility] [phone] 11/09/2013 7:31 AM   I agree with the above  Durene Cal

## 2013-11-09 NOTE — Progress Notes (Signed)
Occupational Therapy Treatment Patient Details Name: [name] [name] MRN: [id number] DOB: [birth year] Today's Date: 11/09/2013    History of present illness Patient s/p placement of thrombolytic catheter L leg with TNKase and heparin infusiing on 10/23/13. Pt with sudden left groin hematoma formation and pallor and shock on 9/12. Pt intubated. Pt extubated 9/14. Pt underwent Left femoral to posterior tibial bypass graft with ipsilateral non-reversed translocated greater saphenous vein and thrombectomy of left posterior tibial artery on 9/17. On 9/18 pt taken back to surgery emergently for occluded left femoral posterior tibial bypass and underwent thrombectomy and administration of nitroglycerin and TPA .Pt with history of AAA, CAD, MI, HTN.   OT comments  Pt progressing well toward OT goals.  He currently requires supervision with BADLs.  He did lose balance with simulated shower in standing - recommend use of grab bar which he has. Will continue to follow  Follow Up Recommendations  No OT follow up;Supervision - Intermittent    Equipment Recommendations  None recommended by OT    Recommendations for Other Services      Precautions / Restrictions Precautions Precautions: Fall Restrictions Weight Bearing Restrictions: No       Mobility Bed Mobility                  Transfers Overall transfer level: Needs assistance Equipment used: Rolling walker (2 wheeled) Transfers: Sit to/from Stand;Stand Pivot Transfers Sit to Stand: Supervision Stand pivot transfers: Supervision            Balance Overall balance assessment: Needs assistance         Standing balance support: During functional activity Standing balance-Leahy Scale: Fair Standing balance comment: LOB with simulted shower in standing                    ADL       Grooming: Wash/dry face;Wash/dry hands;Oral care;Brushing hair;Supervision/safety;Standing       Lower Body Bathing: Supervison/  safety;Sit to/from stand       Lower Body Dressing: Supervision/safety;Sit to/from stand   Toilet Transfer: Supervision/safety;Ambulation;Comfort height toilet       Tub/ Shower Transfer: Supervision/safety;Ambulation;Grab bars   Functional mobility during ADLs: Supervision/safety;Rolling walker General ADL Comments: Pt able to simulate stepping over tub with supervision with UE support.  He simulated shower in standing wiht min guard assist with LOB posteriorly.  Pt reports he has a grab bar in tub that he can hold to as he was able to maintain balance with UE support       Vision                     Perception     Praxis      Cognition   Behavior During Therapy: Flat affect Overall Cognitive Status: Within Functional Limits for tasks assessed                       Extremity/Trunk Assessment               Exercises     Shoulder Instructions       General Comments      Pertinent Vitals/ Pain       Pain Assessment: 0-10 Pain Score: 2  Pain Location: Lt. LE Pain Descriptors / Indicators: Sore Pain Intervention(s): Monitored during session  Home Living  Prior Functioning/Environment              Frequency Min 2X/week     Progress Toward Goals  OT Goals(current goals can now be found in the care plan section)  Progress towards OT goals: Progressing toward goals  ADL Goals Pt Will Perform Grooming: with modified independence;standing Pt Will Perform Upper Body Bathing: with modified independence;standing Pt Will Perform Lower Body Bathing: sit to/from stand;with modified independence Pt Will Perform Lower Body Dressing: with modified independence;sit to/from stand Pt Will Transfer to Toilet: with modified independence;ambulating;regular height toilet;grab bars Pt Will Perform Toileting - Clothing Manipulation and hygiene: with modified independence;sit to/from stand Pt  Will Perform Tub/Shower Transfer: Tub transfer;with min guard assist;rolling walker;3 in 1  Plan Discharge plan remains appropriate    Co-evaluation                 End of Session Equipment Utilized During Treatment: Rolling walker;Other (comment)   Activity Tolerance Patient tolerated treatment well   Patient Left in chair;with call bell/phone within reach   Nurse Communication Mobility status        Time: 1610-9604 OT Time Calculation (min): 14 min  Charges: OT General Charges $OT Visit: 1 Procedure OT Treatments $Self Care/Home Management : 8-22 mins  Jersee Winiarski M 11/09/2013, 11:48 AM

## 2013-11-09 NOTE — Progress Notes (Signed)
Pt has ambulated hall multiple times today using RW with encouragement and assistance from NT.  Will con't plan of care.

## 2013-11-10 LAB — CBC
HCT: 33.3 % — ABNORMAL LOW (ref 39.0–52.0)
Hemoglobin: 11.1 g/dL — ABNORMAL LOW (ref 13.0–17.0)
MCH: 31.4 pg (ref 26.0–34.0)
MCHC: 33.3 g/dL (ref 30.0–36.0)
MCV: 94.3 fL (ref 78.0–100.0)
Platelets: 287 10*3/uL (ref 150–400)
RBC: 3.53 MIL/uL — AB (ref 4.22–5.81)
RDW: 14.9 % (ref 11.5–15.5)
WBC: 5.7 10*3/uL (ref 4.0–10.5)

## 2013-11-10 LAB — PROTIME-INR
INR: 1.23 (ref 0.00–1.49)
PROTHROMBIN TIME: 15.5 s — AB (ref 11.6–15.2)

## 2013-11-10 MED ORDER — WARFARIN SODIUM 10 MG PO TABS
10.0000 mg | ORAL_TABLET | Freq: Once | ORAL | Status: AC
  Administered 2013-11-10: 10 mg via ORAL
  Filled 2013-11-10: qty 1

## 2013-11-10 NOTE — Progress Notes (Signed)
Pt ambulated with rolling walker and NT; no needs voiced; pt back to room to chair; will cont. To monitor.

## 2013-11-10 NOTE — Progress Notes (Signed)
ANTICOAGULATION CONSULT NOTE - Follow Up Consult  Pharmacy Consult for Lovenox and Coumadin Indication: LLE ischemia s/p arteriogram and thrombectomy  Allergies  Allergen Reactions  . Omnipaque [Iohexol] Rash    Pt requires full premeds and needs to bring a driver.  Broke out in rash on back and arms.  Ok w/ premeds  . Oxytetracycline Rash    Terramycin causes rash    Patient Measurements: Height: 6' 2.02" (188 cm) Weight: 195 lb 5.2 oz (88.6 kg) IBW/kg (Calculated) : 82.24  Vital Signs: Temp: 98.6 F (37 C) (09/29 1326) Temp src: Oral (09/29 1326) BP: 100/61 mmHg (09/29 1326) Pulse Rate: 74 (09/29 1326)  Labs:  Recent Labs  11/08/13 0358 11/09/13 0608 11/10/13 0357  HGB 10.7* 11.0* 11.1*  HCT 32.4* 33.8* 33.3*  PLT 213 259 287  LABPROT 15.5* 13.8 15.5*  INR 1.23 1.06 1.23    Estimated Creatinine Clearance: 84.1 ml/min (by C-G formula based on Cr of 0.95).   Assessment: [name] s/p arteriogram + thrombectomy on 9/11, anticoagulation had to be held d/t hematomas and shock. Lovenox and Coumadin bridge for graft occlusion/new clot - recommend to continue Lovenox for at least 5 days and until INR > 2 x 24hrs per guidelines.  Transititioned from IV heparin to Xarelto on 9/24, but changed to Coumadin and Lovenox later that day due to Xarelto not covered by insurance.  Has received Coumadin 5 mg daily x 3, then Coumadin 7.5 mg, then  10 mg x 1. INR has now trended up from 1.06 to 1.23.  Xarelto likely effected INR to some degree over the previous days, but effect should be gone.  Goal of Therapy:  INR 2-3 Monitor platelets by anticoagulation protocol: Yes   Plan:    Repeat Coumadin 10 mg x 1 today.  Continue Lovenox 90 mg sq q12hrs until INR >2.  Daily PT/INR; CBC at least every 3 days.  Dennie Fettersgan, Boysie Bonebrake Donovan, [place] Pager: [phone] 11/10/2013, 1:50 PM

## 2013-11-10 NOTE — Progress Notes (Signed)
No changes.  Incisions look good.  Still with good doppler signals in the left leg.  D/C when INR therapputic  WElls Anglia Blakley

## 2013-11-10 NOTE — Progress Notes (Signed)
NUTRITION FOLLOW UP  INTERVENTION: No nutrition intervention warranted at this time  RD to continue to follow  NUTRITION DIAGNOSIS: Inadequate oral intake, resolved  Goal: Pt to meet >/= 90% of their estimated nutrition needs, met  Monitor:  PO intake, weight, labs, I/O's  ASSESSMENT: 16 M with ischemic left foot 1 day status post placement of thrombolytic catheter in L femoral artery. AM of 9/12 developed bradycardia in conjunction with sudden appearance of hematomas of L should and L groin. PCCM consulted for emergent management.   Pt extubated 9/14.  S/p procedures 9/18: #1: Left femoral to posterior tibial bypass graft with ipsilateral non-reversed translocated greater saphenous vein  #2: Thrombectomy of left posterior tibial artery  #3: Ligation of left below knee popliteal artery   Pt continues with a good appetite.  PO intake 100% per flowsheet records.  Feel he is meeting >/= 90% of re-estimated nutrition needs.  Hopeful D/C this week.  Height: Ht Readings from Last 1 Encounters:  10/23/13 6' 2.02" (1.88 m)    Weight: Wt Readings from Last 1 Encounters:  11/06/13 195 lb 5.2 oz (88.6 kg)    BMI:  Body mass index is 25.07 kg/(m^2).  Re-estimated Nutritional Needs: Kcal: 2000-2200 Protein: 100-110 gm Fluid: per MD  Skin: puncture on left groin  Diet Order: Cardiac   Intake/Output Summary (Last 24 hours) at 11/10/13 1400 Last data filed at 11/10/13 1230  Gross per 24 hour  Intake    600 ml  Output   2550 ml  Net  -1950 ml   Labs:  No results found for this basename: NA, K, CL, CO2, BUN, CREATININE, CALCIUM, MG, PHOS, GLUCOSE,  in the last 168 hours  Scheduled Meds: . antiseptic oral rinse  7 mL Mouth Rinse BID  . coumadin book   Does not apply Once  . docusate sodium  100 mg Oral Daily  . enoxaparin (LOVENOX) injection  90 mg Subcutaneous Q12H  . metoprolol tartrate  25 mg Oral BID  . nicotine  7 mg Transdermal Daily  . omega-3 acid ethyl  esters  3 g Oral BID  . pantoprazole  40 mg Oral Daily  . Saw Palmetto  900 mg Oral BID  . warfarin  10 mg Oral ONCE-1800  . warfarin   Does not apply Once  . Warfarin - Pharmacist Dosing Inpatient   Does not apply q1800    Continuous Infusions: . DOPamine      Past Medical History  Diagnosis Date  . Abdominal aortic aneurysm   . Popliteal aneurysm   . Hyperlipidemia   . Hypertension   . Coronary artery disease   . Detached retina   . Myocardial infarction     2007 heart stent  . Chronic kidney disease     Right kidney stone    Past Surgical History  Procedure Laterality Date  . Coronary stenting      2007  . Abdominal aortic aneurysm repair  11/24/09    EVAR  . Popliteal artery stent  11/2009    left popliteal artery   . Retinal detachment surgery Right 2011  . Tooth extraction  June 2015    Wisdom Tooth Extraction  . Femoral-popliteal bypass graft Left 10/29/2013    Procedure: BYPASS GRAFT FEMORAL-POPLITEAL ARTERY;  Surgeon: Serafina Mitchell, MD;  Location: [facility];  Service: Vascular;  Laterality: Left;  Left Femoral to Posterior Tibial artery bypass graft using nonreversed saphenous vein. and thrombectomy of Tibial arterly.  . Femoral-tibial bypass graft  Left 10/30/2013    Procedure: Reexploration of Femoral-Posterior Tibial Bypass; Thrombectomy of Bypass; Left Leg Angiogram; Posterior Tibial Vein Patch Angioplasty;  Surgeon: Serafina Mitchell, MD;  Location: [facility];  Service: Vascular;  Laterality: Left;    Arthur Holms, RD, LDN Pager #: [phone] After-Hours Pager #: [phone]

## 2013-11-11 LAB — CBC
HCT: 34.4 % — ABNORMAL LOW (ref 39.0–52.0)
HEMOGLOBIN: 11.5 g/dL — AB (ref 13.0–17.0)
MCH: 31.7 pg (ref 26.0–34.0)
MCHC: 33.4 g/dL (ref 30.0–36.0)
MCV: 94.8 fL (ref 78.0–100.0)
Platelets: 268 10*3/uL (ref 150–400)
RBC: 3.63 MIL/uL — ABNORMAL LOW (ref 4.22–5.81)
RDW: 14.8 % (ref 11.5–15.5)
WBC: 5.9 10*3/uL (ref 4.0–10.5)

## 2013-11-11 LAB — PROTIME-INR
INR: 1.22 (ref 0.00–1.49)
PROTHROMBIN TIME: 15.4 s — AB (ref 11.6–15.2)

## 2013-11-11 MED ORDER — WARFARIN SODIUM 7.5 MG PO TABS
15.0000 mg | ORAL_TABLET | Freq: Once | ORAL | Status: AC
Start: 2013-11-11 — End: 2013-11-11
  Administered 2013-11-11: 15 mg via ORAL
  Filled 2013-11-11 (×2): qty 2

## 2013-11-11 NOTE — Progress Notes (Signed)
ANTICOAGULATION CONSULT NOTE - Follow Up Consult  Pharmacy Consult for Lovenox and Coumadin Indication: LLE ischemia s/p arteriogram and thrombectomy  Allergies  Allergen Reactions  . Omnipaque [Iohexol] Rash    Pt requires full premeds and needs to bring a driver.  Broke out in rash on back and arms.  Ok w/ premeds  . Oxytetracycline Rash    Terramycin causes rash    Patient Measurements: Height: 6' 2.02" (188 cm) Weight: 195 lb 5.2 oz (88.6 kg) IBW/kg (Calculated) : 82.24  Vital Signs: Temp: 98.4 F (36.9 C) (09/30 0403) Temp src: Oral (09/30 0403) BP: 112/65 mmHg (09/30 0403) Pulse Rate: 72 (09/30 0403)  Labs:  Recent Labs  11/09/13 0608 11/10/13 0357 11/11/13 0431  HGB 11.0* 11.1* 11.5*  HCT 33.8* 33.3* 34.4*  PLT 259 287 268  LABPROT 13.8 15.5* 15.4*  INR 1.06 1.23 1.22    Estimated Creatinine Clearance: 84.1 ml/min (by C-G formula based on Cr of 0.95).   Assessment: [name] s/p arteriogram + thrombectomy on 9/11, anticoagulation had to be held d/t hematomas and shock. Lovenox and Coumadin bridge for graft occlusion/new clot - recommend to continue Lovenox for at least 5 days and until INR > 2 x 24hrs per guidelines.  Transititioned from IV heparin to Xarelto on 9/24, but changed to Coumadin and Lovenox later that day due to Xarelto not covered by insurance. Lovenox also not covered, so staying inpatient until INR >2.  Has received Coumadin 5 mg daily x 3, then Coumadin 7.5 mg, then  10 mg x 2. INR today is 1.22, unchanged from yesterday.  Xarelto likely effected INR to some degree over previous days, but effect should be gone.  Saw palmetto can potentially increase sensitivity to Coumadin, but no effect seen to date.  Goal of Therapy:  INR 2-3 Monitor platelets by anticoagulation protocol: Yes   Plan:    Increase Coumadin 15 mg x 1 today.  Continue Lovenox 90 mg sq q12hrs until INR >2.  Daily PT/INR; CBC at least every 3 days.  Dennie Fettersgan, Nolan Tuazon Donovan,  [place] Pager: [phone] 11/11/2013, 11:42 AM

## 2013-11-11 NOTE — Progress Notes (Signed)
  [facility] Progress Note  11/11/2013 8:06 AM 19 Days Post-Op  Subjective:  Doing well. Has been ambulating. Wants to go home   Filed Vitals:   11/11/13 0403  BP: 112/65  Pulse: 72  Temp: 98.4 F (36.9 C)  Resp: 18    Physical Exam:  Extremities:1+ right dorsalis pedis pulse. Right foot is warm.     CBC    Component Value Date/Time   WBC 5.9 11/11/2013 0431   RBC 3.63* 11/11/2013 0431   HGB 11.5* 11/11/2013 0431   HCT 34.4* 11/11/2013 0431   PLT 268 11/11/2013 0431   MCV 94.8 11/11/2013 0431   MCH 31.7 11/11/2013 0431   MCHC 33.4 11/11/2013 0431   RDW 14.8 11/11/2013 0431   LYMPHSABS 1.6 10/24/2013 0635   MONOABS 1.0 10/24/2013 0635   EOSABS 0.0 10/24/2013 0635   BASOSABS 0.0 10/24/2013 0635    BMET    Component Value Date/Time   NA 142 11/03/2013 0541   K 3.9 11/03/2013 0541   CL 110 11/03/2013 0541   CO2 21 11/03/2013 0541   GLUCOSE 100* 11/03/2013 0541   BUN 13 11/03/2013 0541   CREATININE 0.95 11/03/2013 0541   CALCIUM 8.3* 11/03/2013 0541   GFRNONAA 82* 11/03/2013 0541   GFRAA >90 11/03/2013 0541    INR    Component Value Date/Time   INR 1.22 11/11/2013 0431     Intake/Output Summary (Last 24 hours) at 11/11/13 0806 Last data filed at 11/11/13 0404  Gross per 24 hour  Intake    600 ml  Output   2175 ml  Net  -1575 ml     Assessment:  68 y.o. male is s/p:  #1: Left femoral to posterior tibial bypass graft with ipsilateral non-reversed translocated greater saphenous vein  #2: Thrombectomy of left posterior tibial artery  #3: Ligation of left below knee popliteal artery  13 Days Post   #1: Thrombectomy of left femoral to posterior tibial artery bypass graft  #2: Reexploration of the left common femoral and left posterior tibial artery  #3: Exposure of the posterior tibial artery at the ankle with vein patch angioplasty  #4: Intra-arterial administration of nitroglycerin and TPA  #5: Left lower extremity angiogram  12 Days Post-Op      Plan: -Doing well. Has faintly palpable right dorsalis pedis pulse. Has been ambulating well.  -On lovenox bridge to coumadin. INR still subtherapeutic at 1.22.  -Dispo: will discharge when INR is therapeutic.    Maris BergerKimberly Daizha Anand, PA-C [facility] Office: [phone] Pager: [phone] 11/11/2013 8:06 AM

## 2013-11-12 LAB — CBC
HCT: 35.8 % — ABNORMAL LOW (ref 39.0–52.0)
HEMOGLOBIN: 11.6 g/dL — AB (ref 13.0–17.0)
MCH: 30.4 pg (ref 26.0–34.0)
MCHC: 32.4 g/dL (ref 30.0–36.0)
MCV: 93.7 fL (ref 78.0–100.0)
Platelets: 317 10*3/uL (ref 150–400)
RBC: 3.82 MIL/uL — ABNORMAL LOW (ref 4.22–5.81)
RDW: 14.5 % (ref 11.5–15.5)
WBC: 6.7 10*3/uL (ref 4.0–10.5)

## 2013-11-12 LAB — HEPATIC FUNCTION PANEL
ALK PHOS: 87 U/L (ref 39–117)
ALT: 21 U/L (ref 0–53)
AST: 16 U/L (ref 0–37)
Albumin: 3.5 g/dL (ref 3.5–5.2)
BILIRUBIN TOTAL: 1.5 mg/dL — AB (ref 0.3–1.2)
Bilirubin, Direct: 0.2 mg/dL (ref 0.0–0.3)
Indirect Bilirubin: 1.3 mg/dL — ABNORMAL HIGH (ref 0.3–0.9)
Total Protein: 7.6 g/dL (ref 6.0–8.3)

## 2013-11-12 LAB — PROTIME-INR
INR: 1.31 (ref 0.00–1.49)
Prothrombin Time: 16.3 seconds — ABNORMAL HIGH (ref 11.6–15.2)

## 2013-11-12 MED ORDER — WARFARIN SODIUM 7.5 MG PO TABS
15.0000 mg | ORAL_TABLET | Freq: Once | ORAL | Status: AC
  Administered 2013-11-12: 15 mg via ORAL
  Filled 2013-11-12: qty 2

## 2013-11-12 NOTE — Progress Notes (Signed)
Pt ambulating in hallway with family; no needs voiced; will cont. To monitor.

## 2013-11-12 NOTE — Progress Notes (Addendum)
  [facility] Progress Note  11/12/2013 8:41 AM 20 Days Post-Op  Subjective:  Having some intermittent numbness of left foot. Denies pain. Has been ambulating.   Filed Vitals:   11/12/13 0548  BP: 121/71  Pulse: 81  Temp: 98.2 F (36.8 C)  Resp: 18    Physical Exam: Incisions:  Left leg incisions c/d/i Extremities:  Good left peroneal doppler signal; palpable right dorsalis pedis pulse dry blood and erythema between left 3rd-4th toes and left 4-5th toes. No purulence. Patient believes he might have bumped his foot while walking.   CBC    Component Value Date/Time   WBC 6.7 11/12/2013 0427   RBC 3.82* 11/12/2013 0427   HGB 11.6* 11/12/2013 0427   HCT 35.8* 11/12/2013 0427   PLT 317 11/12/2013 0427   MCV 93.7 11/12/2013 0427   MCH 30.4 11/12/2013 0427   MCHC 32.4 11/12/2013 0427   RDW 14.5 11/12/2013 0427   LYMPHSABS 1.6 10/24/2013 0635   MONOABS 1.0 10/24/2013 0635   EOSABS 0.0 10/24/2013 0635   BASOSABS 0.0 10/24/2013 0635    BMET    Component Value Date/Time   NA 142 11/03/2013 0541   K 3.9 11/03/2013 0541   CL 110 11/03/2013 0541   CO2 21 11/03/2013 0541   GLUCOSE 100* 11/03/2013 0541   BUN 13 11/03/2013 0541   CREATININE 0.95 11/03/2013 0541   CALCIUM 8.3* 11/03/2013 0541   GFRNONAA 82* 11/03/2013 0541   GFRAA >90 11/03/2013 0541    INR    Component Value Date/Time   INR 1.31 11/12/2013 0427     Intake/Output Summary (Last 24 hours) at 11/12/13 0841 Last data filed at 11/12/13 0757  Gross per 24 hour  Intake   1080 ml  Output   2750 ml  Net  -1670 ml     Assessment:  68 y.o. male is s/p:  #1: Left femoral to posterior tibial bypass graft with ipsilateral non-reversed translocated greater saphenous vein  #2: Thrombectomy of left posterior tibial artery  #3: Ligation of left below knee popliteal artery  14 Days Post   #1: Thrombectomy of left femoral to posterior tibial artery bypass graft  #2: Reexploration of the left common femoral and left  posterior tibial artery  #3: Exposure of the posterior tibial artery at the ankle with vein patch angioplasty  #4: Intra-arterial administration of nitroglycerin and TPA  #5: Left lower extremity angiogram  13 Days Post-Op   20 Days Post-Op  Plan: -Lovenox bridge to coumadin. INR still subtherapeutic today at 1.31, but starting to rise. 15 mg dose today. Will check LFTs today.  -Keep dry gauze in between left third-fifth toes. No evidence of infection. -D/c when INR is therapeutic.   Maris BergerKimberly Trinh, PA-C [facility] Office: [phone] Pager: [phone] 11/12/2013 8:41 AM      Monitor left 5th toe with recent injury. D/C when theraputic on coumadin  [name] [name]

## 2013-11-12 NOTE — Progress Notes (Signed)
Occupational Therapy Treatment and Discharge Patient Details Name: [name] [name] MRN: [id number] DOB: 06/17/1945 Today's Date: 11/12/2013    History of present illness Patient s/p placement of thrombolytic catheter L leg with TNKase and heparin infusiing on 10/23/13. Pt with sudden left groin hematoma formation and pallor and shock on 9/12. Pt intubated. Pt extubated 9/14. Pt underwent Left femoral to posterior tibial bypass graft with ipsilateral non-reversed translocated greater saphenous vein and thrombectomy of left posterior tibial artery on 9/17. On 9/18 pt taken back to surgery emergently for occluded left femoral posterior tibial bypass and underwent thrombectomy and administration of nitroglycerin and TPA .Pt with history of AAA, CAD, MI, HTN. 11/12/13 awaiting INR to be therapeutic   OT comments  This 68 yo male seen today and at a Mod I level using a RW, no further OT needs, we will sign off.   Follow Up Recommendations  No OT follow up   Equipment Recommendations  None recommended by OT       Precautions / Restrictions Precautions Precautions: None Restrictions Weight Bearing Restrictions: No       Mobility Bed Mobility               General bed mobility comments: Pt up standing at sink upon my arrival  Transfers Overall transfer level: Modified independent Equipment used: Rolling walker (2 wheeled)             General transfer comment: Pt ambulated with RW the entire unit of 2W without issues        ADL Overall ADL's : Modified independent                                       General ADL Comments: Pt reports he has a grab bar in his tub/shower combo at home. I had him simulate holding onto the bed rail and lifting one leg to wash lower leg and foot, then the other leg--pt did this without issues. Overall pt is at a Mod I level (using a RW and grab bar at home)                Cognition   Behavior During Therapy: [facility] for  tasks assessed/performed Overall Cognitive Status: Within Functional Limits for tasks assessed                                    Pertinent Vitals/ Pain       Pain Assessment: No/denies pain Pain Score: 0-No pain         Frequency Min 2X/week     Progress Toward Goals  OT Goals(current goals can now be found in the care plan section)  Progress towards OT goals: Goals met/education completed, patient discharged from [facility] Discharge plan remains appropriate       End of Session Equipment Utilized During Treatment: Rolling walker   Activity Tolerance Patient tolerated treatment well   Patient Left in chair;with call bell/phone within reach           Time: 0902-0920 OT Time Calculation (min): 18 min  Charges: OT General Charges $OT Visit: 1 Procedure OT Treatments $Self Care/Home Management : 8-22 mins  Almon Register [phone] 11/12/2013, 10:06 AM

## 2013-11-12 NOTE — Progress Notes (Signed)
ANTICOAGULATION CONSULT NOTE - Follow Up Consult  Pharmacy Consult for Lovenox and Coumadin Indication: LLE ischemia s/p arteriogram and thrombectomy  Allergies  Allergen Reactions  . Omnipaque [Iohexol] Rash    Pt requires full premeds and needs to bring a driver.  Broke out in rash on back and arms.  Ok w/ premeds  . Oxytetracycline Rash    Terramycin causes rash    Patient Measurements: Height: 6' 2.02" (188 cm) Weight: 195 lb 5.2 oz (88.6 kg) IBW/kg (Calculated) : 82.24  Vital Signs: Temp: 98.2 F (36.8 C) (10/01 0548) Temp src: Oral (10/01 0548) BP: 121/71 mmHg (10/01 0548) Pulse Rate: 81 (10/01 0548)  Labs:  Recent Labs  11/10/13 0357 11/11/13 0431 11/12/13 0427  HGB 11.1* 11.5* 11.6*  HCT 33.3* 34.4* 35.8*  PLT 287 268 317  LABPROT 15.5* 15.4* 16.3*  INR 1.23 1.22 1.31    Estimated Creatinine Clearance: 84.1 ml/min (by C-G formula based on Cr of 0.95).   Assessment: [name] s/p arteriogram + thrombectomy on 9/11, anticoagulation had to be held d/t hematomas and shock. Lovenox and Coumadin bridge for graft occlusion/new clot - recommend to continue Lovenox for at least 5 days and until INR > 2 x 24hrs per guidelines.  Transititioned from IV heparin to Xarelto on 9/24, but changed to Coumadin and Lovenox later that day due to Xarelto not covered by insurance. Lovenox also not covered, so staying inpatient until INR >2.  Has received escalating doses of Coumadin. INR today is 1.31.  Xarelto likely effected INR to some degree over previous days, but effect should be gone.  Saw palmetto can potentially increase sensitivity to Coumadin, but no effect seen to date.  Goal of Therapy:  INR 2-3 Monitor platelets by anticoagulation protocol: Yes   Plan:   Repeat Coumadin 15 mg x 1 today, would back off if large INR rise tomorrow.  Continue Lovenox 90 mg sq q12hrs until INR >2.  Daily PT/INR; CBC at least every 3 days.  Tad MooreJessica Damian Hofstra, Pharm D, BCPS  Clinical  Pharmacist Pager [phone]  11/12/2013 9:07 AM

## 2013-11-12 NOTE — Progress Notes (Signed)
Physical Therapy Treatment Patient Details Name: [name] [name] MRN: [id number] DOB: 02/06/1946 Today's Date: 11/12/2013    History of Present Illness Patient s/p placement of thrombolytic catheter L leg with TNKase and heparin infusiing on 10/23/13. Pt with sudden left groin hematoma formation and pallor and shock on 9/12. Pt intubated. Pt extubated 9/14. Pt underwent Left femoral to posterior tibial bypass graft with ipsilateral non-reversed translocated greater saphenous vein and thrombectomy of left posterior tibial artery on 9/17. On 9/18 pt taken back to surgery emergently for occluded left femoral posterior tibial bypass and underwent thrombectomy and administration of nitroglycerin and TPA .Pt with history of AAA, CAD, MI, HTN. 11/12/13 awaiting INR to be therapeutic    PT Comments    Pt doing very well with mobility. Ready for dc from PT standpoint.  Follow Up Recommendations  Home health PT;Supervision - Intermittent     Equipment Recommendations  None recommended by PT    Recommendations for Other Services       Precautions / Restrictions Precautions Precautions: None    Mobility  Bed Mobility               General bed mobility comments: Pt up in chair.  Transfers Overall transfer level: Modified independent Equipment used: Rolling walker (2 wheeled) Transfers: Sit to/from Stand              Ambulation/Gait Ambulation/Gait assistance: Modified independent (Device/Increase time) Ambulation Distance (Feet): 450 Feet Assistive device: Rolling walker (2 wheeled) Gait Pattern/deviations: Step-through pattern;Decreased step length - right;Decreased stance time - left;Trunk flexed     General Gait Details: Verbal cues to stand more erect.   Stairs            Wheelchair Mobility    Modified Rankin (Stroke Patients Only)       Balance     Sitting balance-Leahy Scale: Good       Standing balance-Leahy Scale: Fair                       Cognition Arousal/Alertness: Awake/alert Behavior During Therapy: WFL for tasks assessed/performed Overall Cognitive Status: Within Functional Limits for tasks assessed                      Exercises      General Comments        Pertinent Vitals/Pain Pain Assessment: No/denies pain    Home Living                      Prior Function            PT Goals (current goals can now be found in the care plan section) Progress towards PT goals: Progressing toward goals    Frequency  Min 2X/week    PT Plan Current plan remains appropriate    Co-evaluation             End of Session   Activity Tolerance: Patient tolerated treatment well Patient left: in chair;with call bell/phone within reach     Time: 1350-1402 PT Time Calculation (min): 12 min  Charges:  $Gait Training: 8-22 mins                    G Codes:      Saide Lanuza 11/12/2013, 4:03 PM  [employer] Jeriann Sayres PT [phone]

## 2013-11-13 LAB — PROTIME-INR
INR: 1.49 (ref 0.00–1.49)
Prothrombin Time: 18 seconds — ABNORMAL HIGH (ref 11.6–15.2)

## 2013-11-13 LAB — CBC
HEMATOCRIT: 36 % — AB (ref 39.0–52.0)
Hemoglobin: 11.8 g/dL — ABNORMAL LOW (ref 13.0–17.0)
MCH: 30.8 pg (ref 26.0–34.0)
MCHC: 32.8 g/dL (ref 30.0–36.0)
MCV: 94 fL (ref 78.0–100.0)
PLATELETS: 305 10*3/uL (ref 150–400)
RBC: 3.83 MIL/uL — ABNORMAL LOW (ref 4.22–5.81)
RDW: 14.4 % (ref 11.5–15.5)
WBC: 5.9 10*3/uL (ref 4.0–10.5)

## 2013-11-13 MED ORDER — WARFARIN SODIUM 7.5 MG PO TABS
15.0000 mg | ORAL_TABLET | Freq: Once | ORAL | Status: AC
  Administered 2013-11-13: 15 mg via ORAL
  Filled 2013-11-13: qty 2

## 2013-11-13 NOTE — Progress Notes (Addendum)
  Progress Note    11/13/2013 7:41 AM 21 Days Post-Op  Subjective:  No complaints, but ready to go home  afebrile  Filed Vitals:   11/13/13 0431  BP: 114/66  Pulse: 74  Temp: 98.7 F (37.1 C)  Resp: 20    Physical Exam: Lungs:  Non labored Incisions:  All healing nicely Extremities:  Left foot is warm.  Small open sore on left 4th toe from bumping toe.  CBC    Component Value Date/Time   WBC 5.9 11/13/2013 0335   RBC 3.83* 11/13/2013 0335   HGB 11.8* 11/13/2013 0335   HCT 36.0* 11/13/2013 0335   PLT 305 11/13/2013 0335   MCV 94.0 11/13/2013 0335   MCH 30.8 11/13/2013 0335   MCHC 32.8 11/13/2013 0335   RDW 14.4 11/13/2013 0335   LYMPHSABS 1.6 10/24/2013 0635   MONOABS 1.0 10/24/2013 0635   EOSABS 0.0 10/24/2013 0635   BASOSABS 0.0 10/24/2013 0635    BMET    Component Value Date/Time   NA 142 11/03/2013 0541   K 3.9 11/03/2013 0541   CL 110 11/03/2013 0541   CO2 21 11/03/2013 0541   GLUCOSE 100* 11/03/2013 0541   BUN 13 11/03/2013 0541   CREATININE 0.95 11/03/2013 0541   CALCIUM 8.3* 11/03/2013 0541   GFRNONAA 82* 11/03/2013 0541   GFRAA >90 11/03/2013 0541    INR    Component Value Date/Time   INR 1.49 11/13/2013 0335     Intake/Output Summary (Last 24 hours) at 11/13/13 0741 Last data filed at 11/12/13 2249  Gross per 24 hour  Intake    720 ml  Output   1150 ml  Net   -430 ml     Assessment:  68 y.o. male is s/p:  #1: Left femoral to posterior tibial bypass graft with ipsilateral non-reversed translocated greater saphenous vein  #2: Thrombectomy of left posterior tibial artery  #3: Ligation of left below knee popliteal artery  15 Days Post  #1: Thrombectomy of left femoral to posterior tibial artery bypass graft  #2: Reexploration of the left common femoral and left posterior tibial artery  #3: Exposure of the posterior tibial artery at the ankle with vein patch angioplasty  #4: Intra-arterial administration of nitroglycerin and TPA  #5: Left lower extremity  angiogram  14 Days Post-Op   Plan: -pt is doing well and ready to go home.  Awaiting therapeutic INR -pt may go off the floor to solarium or atrium for different scenery with assistance. -DVT prophylaxis:  Lovenox bridge to coumadin -continue to mobilize and OOB.   Doreatha MassedSamantha Rhyne, PA-C [facility] [phone] 11/13/2013 7:41 AM    I have examined the patient, reviewed and agree with above.  Chivonne Rascon, MD 11/13/2013 11:08 AM

## 2013-11-13 NOTE — Progress Notes (Signed)
Pt ambulated 350 feet with rolling walker and NT; no distress noted; will cont. To monitor.

## 2013-11-13 NOTE — Progress Notes (Signed)
ANTICOAGULATION CONSULT NOTE - Follow Up Consult  Pharmacy Consult for lovenox and coumadin Indication: LLE ischemia s/p arteriogram and thrombectomy    Allergies  Allergen Reactions  . Omnipaque [Iohexol] Rash    Pt requires full premeds and needs to bring a driver.  Broke out in rash on back and arms.  Ok w/ premeds  . Oxytetracycline Rash    Terramycin causes rash    Patient Measurements: Height: 6' 2.02" (188 cm) Weight: 195 lb 5.2 oz (88.6 kg) IBW/kg (Calculated) : 82.24   Vital Signs: Temp: 98.7 F (37.1 C) (10/02 0431) Temp Source: Oral (10/02 0431) BP: 114/66 mmHg (10/02 0431) Pulse Rate: 74 (10/02 0431)  Labs:  Recent Labs  11/11/13 0431 11/12/13 0427 11/13/13 0335  HGB 11.5* 11.6* 11.8*  HCT 34.4* 35.8* 36.0*  PLT 268 317 305  LABPROT 15.4* 16.3* 18.0*  INR 1.22 1.31 1.49    Estimated Creatinine Clearance: 84.1 ml/min (by C-G formula based on Cr of 0.95).  Assessment: [name] s/p arteriogram + thrombectomy on 9/11, anticoagulation had to be held d/t hematomas and shock. Lovenox and Coumadin bridge for graft occlusion/new clot - recommend to continue Lovenox for at least 5 days and until INR > 2 x 24hrs per guidelines.  INR is sub-therapeutic but trending up to 1.49 today.  No bleeding documented.  Goal of Therapy:  INR 2-3 Monitor platelets by anticoagulation protocol: Yes   Plan:  1) repeat coumadin 15mg  PO x1 today 2) continue lovenox 90mg  SQ q12h  [name] P 11/13/2013,8:48 AM

## 2013-11-13 NOTE — Progress Notes (Signed)
Pt up ambulating with NT and rolling walker; no needs voiced; will cont. To monitor.

## 2013-11-14 LAB — CBC
HEMATOCRIT: 35.1 % — AB (ref 39.0–52.0)
Hemoglobin: 11.6 g/dL — ABNORMAL LOW (ref 13.0–17.0)
MCH: 30.7 pg (ref 26.0–34.0)
MCHC: 33 g/dL (ref 30.0–36.0)
MCV: 92.9 fL (ref 78.0–100.0)
PLATELETS: 343 10*3/uL (ref 150–400)
RBC: 3.78 MIL/uL — ABNORMAL LOW (ref 4.22–5.81)
RDW: 14.3 % (ref 11.5–15.5)
WBC: 6 10*3/uL (ref 4.0–10.5)

## 2013-11-14 LAB — PROTIME-INR
INR: 1.76 — ABNORMAL HIGH (ref 0.00–1.49)
Prothrombin Time: 20.5 seconds — ABNORMAL HIGH (ref 11.6–15.2)

## 2013-11-14 MED ORDER — WARFARIN SODIUM 7.5 MG PO TABS
15.0000 mg | ORAL_TABLET | Freq: Once | ORAL | Status: AC
  Administered 2013-11-14: 15 mg via ORAL
  Filled 2013-11-14: qty 2

## 2013-11-14 NOTE — Progress Notes (Addendum)
  Progress Note    11/14/2013 8:01 AM 22 Days Post-Op  Subjective:  No complaints  Afebrile  HR 70's-80's regular  100's-110's systolic  98% RA    Filed Vitals:   11/14/13 0546  BP: 113/71  Pulse: 74  Temp: 98.9 F (37.2 C)  Resp: 18    Physical Exam: Lungs:  Non labored Incisions:  All incisions are healing nicely Extremities:  Left foot is warm, however, the 4th left toe is more dusky today; continues to have sore where he bumped his foot.  CBC    Component Value Date/Time   WBC 6.0 11/14/2013 0400   RBC 3.78* 11/14/2013 0400   HGB 11.6* 11/14/2013 0400   HCT 35.1* 11/14/2013 0400   PLT 343 11/14/2013 0400   MCV 92.9 11/14/2013 0400   MCH 30.7 11/14/2013 0400   MCHC 33.0 11/14/2013 0400   RDW 14.3 11/14/2013 0400   LYMPHSABS 1.6 10/24/2013 0635   MONOABS 1.0 10/24/2013 0635   EOSABS 0.0 10/24/2013 0635   BASOSABS 0.0 10/24/2013 0635    BMET    Component Value Date/Time   NA 142 11/03/2013 0541   K 3.9 11/03/2013 0541   CL 110 11/03/2013 0541   CO2 21 11/03/2013 0541   GLUCOSE 100* 11/03/2013 0541   BUN 13 11/03/2013 0541   CREATININE 0.95 11/03/2013 0541   CALCIUM 8.3* 11/03/2013 0541   GFRNONAA 82* 11/03/2013 0541   GFRAA >90 11/03/2013 0541    INR    Component Value Date/Time   INR 1.76* 11/14/2013 0400     Intake/Output Summary (Last 24 hours) at 11/14/13 0801 Last data filed at 11/13/13 1750  Gross per 24 hour  Intake    180 ml  Output    200 ml  Net    -20 ml     Assessment:  68 y.o. male is s/p:  #1: Left femoral to posterior tibial bypass graft with ipsilateral non-reversed translocated greater saphenous vein  #2: Thrombectomy of left posterior tibial artery  #3: Ligation of left below knee popliteal artery  16 Days Post  #1: Thrombectomy of left femoral to posterior tibial artery bypass graft  #2: Reexploration of the left common femoral and left posterior tibial artery  #3: Exposure of the posterior tibial artery at the ankle with vein patch  angioplasty  #4: Intra-arterial administration of nitroglycerin and TPA  #5: Left lower extremity angiogram  15 Days Post-Op    Plan: -continues to have sore on left 4th toe and it is more dusky today-will need to keep a close eye on this.  -awaiting therapeutic INR.  -DVT prophylaxis: Lovenox bridge to coumadin  -INR heading in the right direction at 1.76 today. Coumadin per pharmacy.  -continue to increase mobilization    Doreatha MassedSamantha Rhyne, PA-C [facility] [phone] 11/14/2013 8:01 AM    Agree with above, foot warm incisions healing Ischemic changes to toe will need to demarcate over time Awaiting INR >2 Most likely d/c home tomorrow  Fabienne Brunsharles Fields, MD [facility] [place] Office: [phone] Pager: [phone]

## 2013-11-14 NOTE — Progress Notes (Signed)
Pt ambulated 300 feet using rolling walker Archie BalboaStein, Dejanay Wamboldt G, RN

## 2013-11-14 NOTE — Progress Notes (Signed)
ANTICOAGULATION CONSULT NOTE - Follow Up Consult  Pharmacy Consult for lovenox and coumadin Indication: LLE ischemia s/p arteriogram and thrombectomy    Allergies  Allergen Reactions  . Omnipaque [Iohexol] Rash    Pt requires full premeds and needs to bring a driver.  Broke out in rash on back and arms.  Ok w/ premeds  . Oxytetracycline Rash    Terramycin causes rash    Patient Measurements: Height: 6' 2.02" (188 cm) Weight: 195 lb 5.2 oz (88.6 kg) IBW/kg (Calculated) : 82.24   Vital Signs: Temp: 98.9 F (37.2 C) (10/03 0546) Temp Source: Oral (10/03 0546) BP: 124/67 mmHg (10/03 0825) Pulse Rate: 83 (10/03 0825)  Labs:  Recent Labs  11/12/13 0427 11/13/13 0335 11/14/13 0400  HGB 11.6* 11.8* 11.6*  HCT 35.8* 36.0* 35.1*  PLT 317 305 343  LABPROT 16.3* 18.0* 20.5*  INR 1.31 1.49 1.76*    Estimated Creatinine Clearance: 84.1 ml/min (by C-G formula based on Cr of 0.95).  Assessment: Patient is a 68 y.o M s/p  s/p arteriogram + thrombectomy 9/11on coumadin and lovenox bridge for graft occlusion.  INR increased from 1.49 to 1.76 today.  Cbc stable, no bleeding documented.  Goal of Therapy:  INR 2-3 Monitor platelets by anticoagulation protocol: Yes   Plan:  1) repeat coumadin 15mg  PO x1 today 2) continue lovenox 90mg  SQ q12h    Hadia Minier P 11/14/2013,10:18 AM

## 2013-11-15 LAB — CBC
HEMATOCRIT: 35.9 % — AB (ref 39.0–52.0)
Hemoglobin: 12 g/dL — ABNORMAL LOW (ref 13.0–17.0)
MCH: 30.9 pg (ref 26.0–34.0)
MCHC: 33.4 g/dL (ref 30.0–36.0)
MCV: 92.5 fL (ref 78.0–100.0)
PLATELETS: 332 10*3/uL (ref 150–400)
RBC: 3.88 MIL/uL — ABNORMAL LOW (ref 4.22–5.81)
RDW: 14.2 % (ref 11.5–15.5)
WBC: 5.7 10*3/uL (ref 4.0–10.5)

## 2013-11-15 LAB — PROTIME-INR
INR: 2.21 — ABNORMAL HIGH (ref 0.00–1.49)
Prothrombin Time: 24.5 s — ABNORMAL HIGH (ref 11.6–15.2)

## 2013-11-15 MED ORDER — WARFARIN SODIUM 10 MG PO TABS
10.0000 mg | ORAL_TABLET | Freq: Once | ORAL | Status: AC
  Administered 2013-11-15: 10 mg via ORAL
  Filled 2013-11-15: qty 1

## 2013-11-15 NOTE — Progress Notes (Signed)
Pt ambulated in hallway 37050ft with rolling walker. Tolerated walk well. Monitoring will continue.

## 2013-11-15 NOTE — Progress Notes (Signed)
ANTICOAGULATION CONSULT NOTE - Follow Up Consult  Pharmacy Consult for lovenox and coumadin Indication: LLE ischemia s/p arteriogram and thrombectomy    Allergies  Allergen Reactions  . Omnipaque [Iohexol] Rash    Pt requires full premeds and needs to bring a driver.  Broke out in rash on back and arms.  Ok w/ premeds  . Oxytetracycline Rash    Terramycin causes rash    Patient Measurements: Height: 6' 2.02" (188 cm) Weight: 195 lb 5.2 oz (88.6 kg) IBW/kg (Calculated) : 82.24  Vital Signs: Temp: 99 F (37.2 C) (10/04 0419) Temp Source: Oral (10/04 0419) BP: 114/69 mmHg (10/04 0419) Pulse Rate: 74 (10/04 0419)  Labs:  Recent Labs  11/13/13 0335 11/14/13 0400 11/15/13 0357  HGB 11.8* 11.6* 12.0*  HCT 36.0* 35.1* 35.9*  PLT 305 343 332  LABPROT 18.0* 20.5* 24.5*  INR 1.49 1.76* 2.21*    Estimated Creatinine Clearance: 84.1 ml/min (by C-G formula based on Cr of 0.95).  Assessment: Patient is a 68 y.o M on anticoagulations for LLE ischemia s/p arteriogram and thrombectomy. INR increased from 1.76 to 2.21 today.  CBC stable.  Lovenox d/ced today d/t therapeutic INR.  Goal of Therapy:  INR 2-3    Plan:  1) Reduce coumadin to 10mg  PO x1 today 2) lovenox d/ced per MD  Jakyron Fabro P 11/15/2013,10:23 AM

## 2013-11-15 NOTE — Progress Notes (Addendum)
  Progress Note    11/15/2013 7:55 AM 23 Days Post-Op  Subjective:  No complaints  Tm 99 HR 70's-80's regular 110's-120's systolic 98% RA  Filed Vitals:   11/15/13 0419  BP: 114/69  Pulse: 74  Temp: 99 F (37.2 C)  Resp: 18    Physical Exam: Cardiac:  regular Lungs:  Non labored Incisions:  Healing nicely Extremities:  4th to ischemic; + doppler signal left PT above ankle incision as well as peroneal.  CBC    Component Value Date/Time   WBC 5.7 11/15/2013 0357   RBC 3.88* 11/15/2013 0357   HGB 12.0* 11/15/2013 0357   HCT 35.9* 11/15/2013 0357   PLT 332 11/15/2013 0357   MCV 92.5 11/15/2013 0357   MCH 30.9 11/15/2013 0357   MCHC 33.4 11/15/2013 0357   RDW 14.2 11/15/2013 0357   LYMPHSABS 1.6 10/24/2013 0635   MONOABS 1.0 10/24/2013 0635   EOSABS 0.0 10/24/2013 0635   BASOSABS 0.0 10/24/2013 0635    BMET    Component Value Date/Time   NA 142 11/03/2013 0541   K 3.9 11/03/2013 0541   CL 110 11/03/2013 0541   CO2 21 11/03/2013 0541   GLUCOSE 100* 11/03/2013 0541   BUN 13 11/03/2013 0541   CREATININE 0.95 11/03/2013 0541   CALCIUM 8.3* 11/03/2013 0541   GFRNONAA 82* 11/03/2013 0541   GFRAA >90 11/03/2013 0541    INR    Component Value Date/Time   INR 2.21* 11/15/2013 0357     Intake/Output Summary (Last 24 hours) at 11/15/13 0755 Last data filed at 11/14/13 1431  Gross per 24 hour  Intake   1140 ml  Output      0 ml  Net   1140 ml     Assessment:  68 y.o. male is s/p:  #1: Left femoral to posterior tibial bypass graft with ipsilateral non-reversed translocated greater saphenous vein  #2: Thrombectomy of left posterior tibial artery  #3: Ligation of left below knee popliteal artery  17 Days Post  #1: Thrombectomy of left femoral to posterior tibial artery bypass graft  #2: Reexploration of the left common femoral and left posterior tibial artery  #3: Exposure of the posterior tibial artery at the ankle with vein patch angioplasty  #4: Intra-arterial  administration of nitroglycerin and TPA  #5: Left lower extremity angiogram  16 Days Post-Op      Plan: -DVT prophylaxis:  INR is therapeutic this am.  Jump in INR from 1.76 to 2.21.  Pt will stay in hospital today to have INR checked in am as it is most likely going to trend upward again tomorrow.   -discontinue Lovenox -await demarcation of toe over time -continue to mobilize--may go off floor with assist  Doreatha MassedSamantha Rhyne, PA-C [facility] [phone] 11/15/2013 7:55 AM   PT doppler incisions healing, ischemic left fourth toe Agree with above, rapid rise of INR Will delay d/c until tomorrow to make sure he is not supratherapeutic.  Fabienne Brunsharles Fields, MD [facility] [place] Office: [phone] Pager: [phone]

## 2013-11-16 ENCOUNTER — Other Ambulatory Visit: Payer: Self-pay

## 2013-11-16 ENCOUNTER — Telehealth: Payer: Self-pay | Admitting: Surgery

## 2013-11-16 DIAGNOSIS — Z48812 Encounter for surgical aftercare following surgery on the circulatory system: Secondary | ICD-10-CM

## 2013-11-16 DIAGNOSIS — I739 Peripheral vascular disease, unspecified: Secondary | ICD-10-CM

## 2013-11-16 LAB — CBC
HEMATOCRIT: 35.3 % — AB (ref 39.0–52.0)
Hemoglobin: 12 g/dL — ABNORMAL LOW (ref 13.0–17.0)
MCH: 31.3 pg (ref 26.0–34.0)
MCHC: 34 g/dL (ref 30.0–36.0)
MCV: 91.9 fL (ref 78.0–100.0)
PLATELETS: 344 10*3/uL (ref 150–400)
RBC: 3.84 MIL/uL — ABNORMAL LOW (ref 4.22–5.81)
RDW: 13.9 % (ref 11.5–15.5)
WBC: 5.6 10*3/uL (ref 4.0–10.5)

## 2013-11-16 LAB — PROTIME-INR
INR: 2.06 — ABNORMAL HIGH (ref 0.00–1.49)
Prothrombin Time: 23.2 seconds — ABNORMAL HIGH (ref 11.6–15.2)

## 2013-11-16 MED ORDER — WARFARIN SODIUM 2.5 MG PO TABS: 2.5000 mg | ORAL_TABLET | Freq: Every day | ORAL | Status: DC

## 2013-11-16 MED ORDER — WARFARIN SODIUM 10 MG PO TABS: 10.0000 mg | ORAL_TABLET | Freq: Every day | ORAL | Status: DC

## 2013-11-16 NOTE — Telephone Encounter (Addendum)
Message copied by Fredrich BirksMILLIKAN, DANA P on Mon Nov 16, 2013  2:59 PM ------      Message from: Phillips OdorPULLINS, CAROL S      Created: Mon Nov 16, 2013 12:18 PM      Regarding: needs LE Art. Duplex and ABI's with appt. 10/12       Per [facility] pt. needs both Left LE Art Duplex and ABI's @ post-op visit; s/p Thrombectomy (L) Fem-Tib BP 9/18.       ----- Message -----         From: Dara LordsSamantha J Rhyne, PA-C         Sent: 11/16/2013  10:11 AM           To: [facility] Charge Pool            I'm sorry to send another inbox message, but this pt will also need ABI's at return visit as he did not get them post op.              Thanks,      Samantha       ------  11/16/13: spoke with patient re appt time change to accommodate the labs needed, dpm

## 2013-11-16 NOTE — Progress Notes (Signed)
Reviewed dc instructions including follow up care and appointments  with patient and his wife, questions answered.  Pt Has prescriptions and copy of dc instructions. For Dc home with family Georgette DoverGlover, Kimberly A

## 2013-11-16 NOTE — Telephone Encounter (Addendum)
Message copied by Fredrich BirksMILLIKAN, DANA P on Mon Nov 16, 2013 10:31 AM ------      Message from: Phillips OdorPULLINS, CAROL S      Created: Mon Nov 16, 2013  9:07 AM      Regarding: needs appt. for INR check 10/6                   ----- Message -----         From: Dara LordsSamantha J Rhyne, PA-C         Sent: 11/14/2013   9:05 AM           To: [facility] Charge Pool            Please make appt for this pt with Dortha Kernennis [name], PA for INR check on 11/17/13 and let pt know.  Very important that he has his INR checked to adjust his coumadin.              Thanks! ------  11/13/13: Patient was already scheduled to see Dortha KernDennis [name] on 11/17/13 @ 1:30pm/ I left a msg at [phone] to make sure Mr Verlin FesterBeveridge was aware, dpm

## 2013-11-16 NOTE — Progress Notes (Signed)
  Progress Note    11/16/2013 7:20 AM   Subjective:  In restroom  Afebrile HR 70's-80's 100's-110's systolic 98% RA  Filed Vitals:   11/16/13 0423  BP: 112/68  Pulse: 70  Temp: 98.5 F (36.9 C)  Resp: 18    Physical Exam:   CBC    Component Value Date/Time   WBC 5.6 11/16/2013 0355   RBC 3.84* 11/16/2013 0355   HGB 12.0* 11/16/2013 0355   HCT 35.3* 11/16/2013 0355   PLT 344 11/16/2013 0355   MCV 91.9 11/16/2013 0355   MCH 31.3 11/16/2013 0355   MCHC 34.0 11/16/2013 0355   RDW 13.9 11/16/2013 0355   LYMPHSABS 1.6 10/24/2013 0635   MONOABS 1.0 10/24/2013 0635   EOSABS 0.0 10/24/2013 0635   BASOSABS 0.0 10/24/2013 0635    BMET    Component Value Date/Time   NA 142 11/03/2013 0541   K 3.9 11/03/2013 0541   CL 110 11/03/2013 0541   CO2 21 11/03/2013 0541   GLUCOSE 100* 11/03/2013 0541   BUN 13 11/03/2013 0541   CREATININE 0.95 11/03/2013 0541   CALCIUM 8.3* 11/03/2013 0541   GFRNONAA 82* 11/03/2013 0541   GFRAA >90 11/03/2013 0541    INR    Component Value Date/Time   INR 2.06* 11/16/2013 0355     Intake/Output Summary (Last 24 hours) at 11/16/13 0720 Last data filed at 11/15/13 1806  Gross per 24 hour  Intake    840 ml  Output      0 ml  Net    840 ml     Assessment:  68 y.o. male is s/p:  #1: Left femoral to posterior tibial bypass graft with ipsilateral non-reversed translocated greater saphenous vein  #2: Thrombectomy of left posterior tibial artery  #3: Ligation of left below knee popliteal artery  18 Days Post  #1: Thrombectomy of left femoral to posterior tibial artery bypass graft  #2: Reexploration of the left common femoral and left posterior tibial artery  #3: Exposure of the posterior tibial artery at the ankle with vein patch angioplasty  #4: Intra-arterial administration of nitroglycerin and TPA  #5: Left lower extremity angiogram  17 Days Post-Op    Plan: -pt in restroom, will come back to see him after 1st case in OR this morning. -DVT  prophylaxis:  Coumadin. -INR is therapeutic at 2.06.  His INR was trending upward with coumadin 15mg .  Received 10 mg last pm and is down to 2.06 from 2.21.    Will have him get his INR checked at PCP tomorrow. -discussed with Dr. Myra GianottiBrabham about pt's coumadin dosing.  Will send him out on 12.5mg  and have him check his INR tomorrow with Dortha Kernennis [name], PA. -He has an appt with Dr. Myra GianottiBrabham 11/23/13 @ 1545.  We will remove his sutures at that time.    Doreatha MassedSamantha Jaxten Brosh, PA-C [facility] [phone] 11/16/2013 7:20 AM   Returned to see pt--no complaints this am.  His exam is unchanged from the weekend.  We will discharge him today.  I have discussed with him the dosing of the coumadin and that I gave him an Rx for coumadin 10mg  and one for coumadin 2.5mg  and he is to take a total of 12.5 mg daily.  [facility] to see PA and an INR check.  His medication will be adjusted accordingly.  Cecilie Heidel 11/16/2013 10:14 AM

## 2013-11-17 ENCOUNTER — Telehealth: Payer: Self-pay

## 2013-11-17 NOTE — Telephone Encounter (Signed)
[facility].  Stated Elmer Rampennis Crissmon, [place] is requesting appt. ASAP due to breakdown of (L) 4th toe, and (L) forefoot and toe cold upon palpation, and inability to palpate (L) foot pedal pulse.  Discussed with Dr. Arbie CookeyEarly.  Stated pt. Should come to office tomorrow for assessment, duplex of BP graft and ABI's.  Called pt's home; spoke to wife.  Informed that pt. Will receive phone call tomorrow with appt. time.  Verb. Understanding.

## 2013-11-18 ENCOUNTER — Encounter: Payer: Self-pay | Admitting: Vascular Surgery

## 2013-11-18 ENCOUNTER — Ambulatory Visit (INDEPENDENT_AMBULATORY_CARE_PROVIDER_SITE_OTHER): Payer: Medicare Other | Admitting: Vascular Surgery

## 2013-11-18 ENCOUNTER — Ambulatory Visit (HOSPITAL_COMMUNITY)
Admission: RE | Admit: 2013-11-18 | Discharge: 2013-11-18 | Disposition: A | Discharge status: Home / Self Care | Payer: Medicare Other | Source: Ambulatory Visit | Attending: Vascular Surgery | Admitting: Vascular Surgery

## 2013-11-18 ENCOUNTER — Ambulatory Visit (INDEPENDENT_AMBULATORY_CARE_PROVIDER_SITE_OTHER)
Admission: RE | Admit: 2013-11-18 | Discharge: 2013-11-18 | Disposition: A | Discharge status: Home / Self Care | Payer: Medicare Other | Source: Ambulatory Visit | Attending: Vascular Surgery | Admitting: Vascular Surgery

## 2013-11-18 VITALS — BP 128/71 | HR 82 | Resp 16 | Ht 74.0 in | Wt 184.0 lb

## 2013-11-18 DIAGNOSIS — L539 Erythematous condition, unspecified: Secondary | ICD-10-CM

## 2013-11-18 DIAGNOSIS — I739 Peripheral vascular disease, unspecified: Secondary | ICD-10-CM | POA: Diagnosis not present

## 2013-11-18 DIAGNOSIS — Z48812 Encounter for surgical aftercare following surgery on the circulatory system: Secondary | ICD-10-CM | POA: Diagnosis not present

## 2013-11-18 MED ORDER — CEPHALEXIN 500 MG PO CAPS: 500.0000 mg | ORAL_CAPSULE | Freq: Three times a day (TID) | ORAL | Status: DC

## 2013-11-18 NOTE — Progress Notes (Signed)
   Patient name: [name] [name] MRN: [id number] DOB: 03/14/1945 Sex: male  REASON FOR VISIT: Follow up of left foot wound.  HPI: [name] [name] is a 68 y.o. male who underwent a left femoral to posterior tibial artery bypass graft by Dr. Myra GianottiBrabham on 10/29/2013. This was done with an ipsilateral nonreversible saphenous vein graft. Patient also had thrombectomy of the left posterior tibial artery. 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 The bypass graft occluded on postoperative day #1 and the patient underwent thrombectomy of the bypass and exposure of the posterior tibial artery distally with vein patch angioplasty. I believe he was placed on Coumadin to help maintain patency of his bypass graft.   Apparently, before he left the hospital he did have a wound on the left fourth toe adjacent fifth toe. He comes in to have this checked. He denies fever or chills.  REVIEW OF SYSTEMS: Arly.Keller[X ] denotes positive finding; [  ] denotes negative finding  CARDIOVASCULAR:  [ ]  chest pain   [ ]  dyspnea on exertion    CONSTITUTIONAL:  [ ]  fever   [ ]  chills  PHYSICAL EXAM: Filed Vitals:   11/18/13 1615  BP: 128/71  Pulse: 82  Resp: 16  Height: 6\' 2"  (1.88 m)  Weight: 184 lb (83.462 kg)   Body mass index is 23.61 kg/(m^2). GENERAL: The patient is a well-nourished male, in no acute distress. The vital signs are documented above. CARDIOVASCULAR: There is a regular rate and rhythm. PULMONARY: There is good air exchange bilaterally without wheezing or rales. By my exam, he has fairly brisk Doppler signals in the posterior tibial, dorsalis pedis, and peroneal positions although monophasic.  DUPLEX: Duplex scan today shows that the bypass graft in the left leg is patent. However there is disease distally. The posterior tibial artery beyond the anastomosis is occluded in the mid calf. The patient  has monophasic Doppler signals in the dorsalis pedis position with an ABI of 39%.  MEDICAL ISSUES: The patient has an extensive wound involving the left fourth and adjacent fifth toe. I have put him on Keflex. I have instructed him to soak his foot in lukewarm dial soap soaks daily. We will have him keep his regularly scheduled follow up appointment with Dr. Myra GianottiBrabham. 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  DICKSON,CHRISTOPHER S [facility] Beeper: [phone]

## 2013-11-20 ENCOUNTER — Encounter: Payer: Self-pay | Admitting: Surgery

## 2013-11-20 DIAGNOSIS — E785 Hyperlipidemia, unspecified: Secondary | ICD-10-CM | POA: Diagnosis not present

## 2013-11-20 DIAGNOSIS — I70262 Atherosclerosis of native arteries of extremities with gangrene, left leg: Secondary | ICD-10-CM | POA: Diagnosis not present

## 2013-11-20 DIAGNOSIS — N189 Chronic kidney disease, unspecified: Secondary | ICD-10-CM | POA: Diagnosis not present

## 2013-11-20 DIAGNOSIS — Z48812 Encounter for surgical aftercare following surgery on the circulatory system: Secondary | ICD-10-CM | POA: Diagnosis not present

## 2013-11-20 DIAGNOSIS — I129 Hypertensive chronic kidney disease with stage 1 through stage 4 chronic kidney disease, or unspecified chronic kidney disease: Secondary | ICD-10-CM | POA: Diagnosis not present

## 2013-11-20 DIAGNOSIS — Z7901 Long term (current) use of anticoagulants: Secondary | ICD-10-CM | POA: Diagnosis not present

## 2013-11-20 DIAGNOSIS — L97521 Non-pressure chronic ulcer of other part of left foot limited to breakdown of skin: Secondary | ICD-10-CM | POA: Diagnosis not present

## 2013-11-20 DIAGNOSIS — Z5181 Encounter for therapeutic drug level monitoring: Secondary | ICD-10-CM | POA: Diagnosis not present

## 2013-11-20 DIAGNOSIS — I2510 Atherosclerotic heart disease of native coronary artery without angina pectoris: Secondary | ICD-10-CM | POA: Diagnosis not present

## 2013-11-23 ENCOUNTER — Other Ambulatory Visit (HOSPITAL_COMMUNITY): Payer: Medicare Other

## 2013-11-23 ENCOUNTER — Encounter: Payer: Self-pay | Admitting: Surgery

## 2013-11-23 ENCOUNTER — Encounter: Payer: Medicare Other | Admitting: Surgery

## 2013-11-23 ENCOUNTER — Ambulatory Visit (INDEPENDENT_AMBULATORY_CARE_PROVIDER_SITE_OTHER): Payer: Medicare Other | Admitting: Surgery

## 2013-11-23 ENCOUNTER — Encounter (HOSPITAL_COMMUNITY): Payer: Medicare Other

## 2013-11-23 VITALS — BP 128/83 | HR 80 | Temp 98.4°F | Ht 74.0 in | Wt 186.3 lb

## 2013-11-23 DIAGNOSIS — I739 Peripheral vascular disease, unspecified: Secondary | ICD-10-CM

## 2013-11-23 MED ORDER — LEVOFLOXACIN 500 MG PO TABS: 500.0000 mg | ORAL_TABLET | Freq: Every day | ORAL | Status: DC

## 2013-11-23 NOTE — Progress Notes (Signed)
The patient comes in today for followup.  He initially underwent endovascular abdominal aortic aneurysm repair in October of 2011.  He has a known type II endoleak, however his aneurysm continues to decrease.  He is also status post endovascular repair of a left popliteal artery aneurysm on October 2011. He presented to the office on 10/21/2013 with a six-day history of pain and coldness in his left foot.  He was found to have an occluded popliteal stent by angiography the following day.  An attempt at from a lysis was performed, however the patient developed some cardiac issues as well as bleeding, and this had to be discontinued.  The patient continued to have an ischemic foot and therefore he was taken to the operating room for revascularization.  On 10/29/2013, he underwent femoral to posterior tibial artery bypass graft.  He had an excellent caliber vein.  The bypass graft occluded early and he was taken back.  The bypass was safe to be re-opened.  I explored the posterior tibial artery behind the ankle.  I passed catheters all the way into the pedal arch and injected TPA.  The patient was transitioned to oral anticoagulation and ultimately discharged.  While he was in the hospital he did bump his toe and has developed a area of erythema and black eschar on the fourth and fifth digits.  He was seen by Dr. Edilia Boickson last week and started on antibiotics.  He is here today for followup.  There appears to be progression of the ischemic changes to the fourth and fifth toes on the left.  There is also some early mottling of the toes 1-3.  There is excellent Doppler signal around the knee where the bypass graft would be located.  I was also able to get a monophasic dorsalis pedis signal on the foot.  I spent approximately 30 minutes discussing with the patient and his son the potential outcome.  He is going to require some form of amputation.  I'm concerned about the level of amputation given the known blood flow.   We discussed the possibility of a transmetatarsal amputation, toe amputation, and below knee amputation.  I'm going to give the patient more time to demarcate his wound.  I will switch his antibiotics from Keflex to Levaquin.  I am also giving him 40 Dilaudid  2 mg tablets.  He'll followup in one week.

## 2013-11-23 NOTE — Discharge Summary (Signed)
I agree with the above  Wells Brabham 

## 2013-11-23 NOTE — Progress Notes (Signed)
I agree with the above  [name] [name] 

## 2013-11-24 DIAGNOSIS — I749 Embolism and thrombosis of unspecified artery: Secondary | ICD-10-CM | POA: Diagnosis not present

## 2013-11-24 DIAGNOSIS — Z7901 Long term (current) use of anticoagulants: Secondary | ICD-10-CM | POA: Diagnosis not present

## 2013-11-26 DIAGNOSIS — I70262 Atherosclerosis of native arteries of extremities with gangrene, left leg: Secondary | ICD-10-CM | POA: Diagnosis not present

## 2013-11-26 DIAGNOSIS — N189 Chronic kidney disease, unspecified: Secondary | ICD-10-CM | POA: Diagnosis not present

## 2013-11-26 DIAGNOSIS — Z48812 Encounter for surgical aftercare following surgery on the circulatory system: Secondary | ICD-10-CM | POA: Diagnosis not present

## 2013-11-26 DIAGNOSIS — I2510 Atherosclerotic heart disease of native coronary artery without angina pectoris: Secondary | ICD-10-CM | POA: Diagnosis not present

## 2013-11-26 DIAGNOSIS — L97521 Non-pressure chronic ulcer of other part of left foot limited to breakdown of skin: Secondary | ICD-10-CM | POA: Diagnosis not present

## 2013-11-26 DIAGNOSIS — I129 Hypertensive chronic kidney disease with stage 1 through stage 4 chronic kidney disease, or unspecified chronic kidney disease: Secondary | ICD-10-CM | POA: Diagnosis not present

## 2013-11-27 ENCOUNTER — Encounter: Payer: Self-pay | Admitting: Surgery

## 2013-11-30 ENCOUNTER — Encounter: Payer: Self-pay | Admitting: Surgery

## 2013-11-30 ENCOUNTER — Ambulatory Visit (INDEPENDENT_AMBULATORY_CARE_PROVIDER_SITE_OTHER): Payer: Self-pay | Admitting: Surgery

## 2013-11-30 VITALS — BP 123/73 | HR 91 | Temp 98.1°F | Resp 18 | Ht 74.0 in | Wt 185.0 lb

## 2013-11-30 DIAGNOSIS — I739 Peripheral vascular disease, unspecified: Secondary | ICD-10-CM

## 2013-11-30 DIAGNOSIS — I998 Other disorder of circulatory system: Secondary | ICD-10-CM | POA: Insufficient documentation

## 2013-11-30 NOTE — Progress Notes (Signed)
The patient comes in today for followup. He initially underwent endovascular abdominal aortic aneurysm repair in October of 2011. He has a known type II endoleak, however his aneurysm continues to decrease. He is also status post endovascular repair of a left popliteal artery aneurysm on October 2011.  He presented to the office on 10/21/2013 with a six-day history of pain and coldness in his left foot. He was found to have an occluded popliteal stent by angiography the following day. An attempt at from a lysis was performed, however the patient developed some cardiac issues as well as bleeding, and this had to be discontinued. The patient continued to have an ischemic foot and therefore he was taken to the operating room for revascularization. On 10/29/2013, he underwent femoral to posterior tibial artery bypass graft. He had an excellent caliber vein. The bypass graft occluded early and he was taken back. The bypass was safe to be re-opened. I explored the posterior tibial artery behind the ankle. I passed catheters all the way into the pedal arch and injected TPA. The patient was transitioned to oral anticoagulation and ultimately discharged. While he was in the hospital he did bump his toe and has developed a area of erythema and black eschar on the fourth and fifth digits.  He has been on antibiotics.  He was switched from Keflex to Levaquin last week.  The patient states that he asked to get a good night sleep last night.  His pain is better with the leg in a deep tendon position, however this makes the swelling more severe.  On examination he continues to have excellent Doppler signal in the below knee area as well as the dorsalis pedis and posterior tibial artery.  There has been some demarcation of the fourth and fifth toes with a black eschar.  The erythema has receded somewhat.  I discussed continue the antibiotics for another week for full demarcation and then proceeding with amputation of the  fourth and fifth toe.  I was very explicit in stating that he may require a more proximal amputation.  We spent approximately 45 minutes discussing how to cope with a more proximal amputation.

## 2013-12-04 ENCOUNTER — Encounter: Payer: Self-pay | Admitting: Surgery

## 2013-12-07 ENCOUNTER — Ambulatory Visit (INDEPENDENT_AMBULATORY_CARE_PROVIDER_SITE_OTHER): Payer: Medicare Other | Admitting: Surgery

## 2013-12-07 ENCOUNTER — Encounter: Payer: Self-pay | Admitting: Surgery

## 2013-12-07 VITALS — BP 132/77 | HR 86 | Temp 98.4°F | Resp 16 | Ht 74.0 in | Wt 187.6 lb

## 2013-12-07 DIAGNOSIS — I739 Peripheral vascular disease, unspecified: Secondary | ICD-10-CM

## 2013-12-07 DIAGNOSIS — I998 Other disorder of circulatory system: Secondary | ICD-10-CM

## 2013-12-07 NOTE — Progress Notes (Signed)
   Patient name: [name] [name] MRN: [id number] DOB: 06/18/1945 Sex: male     Chief Complaint  Patient presents with  . Follow-up    Discuss surgery    HISTORY OF PRESENT ILLNESS: The patient comes in today for followup. He initially underwent endovascular abdominal aortic aneurysm repair in October of 2011. He has a known type II endoleak, however his aneurysm continues to decrease. He is also status post endovascular repair of a left popliteal artery aneurysm on October 2011.  He presented to the office on 10/21/2013 with a six-day history of pain and coldness in his left foot. He was found to have an occluded popliteal stent by angiography the following day. An attempt at from a lysis was performed, however the patient developed some cardiac issues as well as bleeding, and this had to be discontinued. The patient continued to have an ischemic foot and therefore he was taken to the operating room for revascularization. On 10/29/2013, he underwent femoral to posterior tibial artery bypass graft. He had an excellent caliber vein. The bypass graft occluded early and he was taken back. The bypass was safe to be re-opened. I explored the posterior tibial artery behind the ankle. I passed catheters all the way into the pedal arch and injected TPA. The patient was transitioned to oral anticoagulation and ultimately discharged. While he was in the hospital he did bump his toe and has developed a area of erythema and black eschar on the fourth and fifth digits. He has been on antibiotics.    Past Medical History  Diagnosis Date  . Abdominal aortic aneurysm   . Popliteal aneurysm   . Hyperlipidemia   . Hypertension   . Coronary artery disease   . Detached retina   . Myocardial infarction     2007 heart stent  . Chronic kidney disease     Right kidney stone    Past Surgical History  Procedure Laterality Date  . Coronary stenting      2007  . Abdominal aortic aneurysm repair  11/24/09    EVAR  . Popliteal artery stent  11/2009    left popliteal artery   . Retinal detachment surgery Right 2011  . Tooth extraction  June 2015    Wisdom Tooth Extraction  . Femoral-popliteal bypass graft Left 10/29/2013    Procedure: BYPASS GRAFT FEMORAL-POPLITEAL ARTERY;  Surgeon: Vance W Brabham, MD;  Location: [facility] OR;  Service: Vascular;  Laterality: Left;  Left Femoral to Posterior Tibial artery bypass graft using nonreversed saphenous vein. and thrombectomy of Tibial arterly.  . Femoral-tibial bypass graft Left 10/30/2013    Procedure: Reexploration of Femoral-Posterior Tibial Bypass; Thrombectomy of Bypass; Left Leg Angiogram; Posterior Tibial Vein Patch Angioplasty;  Surgeon: Vance W Brabham, MD;  Location: [facility] OR;  Service: Vascular;  Laterality: Left;    History   Social History  . Marital Status: Married    Spouse Name: N/A    Number of Children: N/A  . Years of Education: N/A   Occupational History  . Retired    Social History Main Topics  . Smoking status: Former Smoker -- 15 years    Types: Cigarettes    Quit date: 09/21/1985  . Smokeless tobacco: Current User    Types: Chew  . Alcohol Use: No  . Drug Use: No  . Sexual Activity: Not on file   Other Topics Concern  . Not on file   Social History Narrative   Lives with family.      Family History  Problem Relation Age of Onset  . Aneurysm Sister     possibly   . Peripheral vascular disease Brother     Allergies as of 12/07/2013 - Review Complete 12/07/2013  Allergen Reaction Noted  . Omnipaque [iohexol] Rash 03/27/2010  . Oxytetracycline Rash 10/11/2009    Current Outpatient Prescriptions on File Prior to Visit  Medication Sig Dispense Refill  . aspirin 81 MG EC tablet Take 81 mg by mouth daily.        Marland Kitchen. atorvastatin (LIPITOR) 40 MG tablet Take 40 mg by mouth daily.      . cephALEXin (KEFLEX) 500 MG capsule Take 1 capsule (500 mg total) by mouth 3 (three) times daily.  42 capsule  1  . HYDROmorphone  (DILAUDID) 2 MG tablet Take 2 mg by mouth every 6 (six) hours as needed for severe pain. Count 40, 1-2 tablets q 6hrs PRN      . levofloxacin (LEVAQUIN) 500 MG tablet Take 1 tablet (500 mg total) by mouth daily.  10 tablet  0  . metoprolol tartrate (LOPRESSOR) 25 MG tablet Take 25 mg by mouth 2 (two) times daily.       . Omega-3 Fatty Acids (FISH OIL TRIPLE STRENGTH) 1400 MG CAPS Take 2,800 mg by mouth 2 (two) times daily.      Marland Kitchen. oxyCODONE (ROXICODONE) 5 MG immediate release tablet Take 1 tablet (5 mg total) by mouth every 6 (six) hours as needed.  30 tablet  0  . Saw Palmetto 450 MG CAPS Take 900 mg by mouth 2 (two) times daily.      Marland Kitchen. warfarin (COUMADIN) 10 MG tablet Take 1 tablet (10 mg total) by mouth daily.  30 tablet  2  . warfarin (COUMADIN) 2.5 MG tablet Take 1 tablet (2.5 mg total) by mouth daily.  30 tablet  2   No current facility-administered medications on file prior to visit.      PHYSICAL EXAMINATION:   Vital signs are BP 132/77  Pulse 86  Temp(Src) 98.4 F (36.9 C) (Oral)  Resp 16  Ht 6\' 2"  (1.88 m)  Wt 187 lb 9.6 oz (85.095 kg)  BMI 24.08 kg/m2  SpO2 98% General: The patient appears their stated age. HEENT:  No gross abnormalities Pulmonary:  Non labored breathing Musculoskeletal: There are no major deformities. Neurologic: No focal weakness or paresthesias are detected, Psychiatric: The patient has normal affect. Cardiovascular: Nonpalpable left leg pulses.  Dry gangrene 2.  Fifth digits.  Erythema has improved.   Diagnostic Studies none  Assessment: Ischemic toes Plan: Discussed with patient and wife the need to proceed with amputation of his fourth and fifth toes.  He understands he may require a more proximal amputation.  He is on Coumadin.  His most recent INR was 3.1.  I am going to stop his Coumadin today.  I will check his INR on Thursday, October 29.  If it is less than 2.0 I will proceed with amputation of the left fourth and fifth toe.  He  potentially will be admitted overnight versus going home.  He will need a Darco shoe if he goes home   V. Charlena CrossWells Brabham IV, M.D. [facility] [place] Office: [phone] Pager:  [phone]

## 2013-12-08 ENCOUNTER — Other Ambulatory Visit: Payer: Self-pay

## 2013-12-08 ENCOUNTER — Encounter (HOSPITAL_COMMUNITY): Payer: Self-pay | Admitting: Pharmacy Technician

## 2013-12-09 ENCOUNTER — Encounter (HOSPITAL_COMMUNITY): Payer: Self-pay | Admitting: *Deleted

## 2013-12-09 DIAGNOSIS — Z7901 Long term (current) use of anticoagulants: Secondary | ICD-10-CM | POA: Diagnosis not present

## 2013-12-09 DIAGNOSIS — I749 Embolism and thrombosis of unspecified artery: Secondary | ICD-10-CM | POA: Diagnosis not present

## 2013-12-09 MED ORDER — CEFUROXIME SODIUM 1.5 G IJ SOLR
1.5000 g | INTRAMUSCULAR | Status: AC
  Administered 2013-12-10: 1.5 g via INTRAVENOUS
  Filled 2013-12-09: qty 1.5

## 2013-12-09 MED ORDER — SODIUM CHLORIDE 0.9 % IV SOLN: INTRAVENOUS | Status: DC

## 2013-12-09 NOTE — Progress Notes (Signed)
12/09/13 0922  OBSTRUCTIVE SLEEP APNEA  Have you ever been diagnosed with sleep apnea through a sleep study? No  Do you snore loudly (loud enough to be heard through closed doors)?  0  Do you often feel tired, fatigued, or sleepy during the daytime? 0  Has anyone observed you stop breathing during your sleep? 0  Do you have, or are you being treated for high blood pressure? 1  BMI more than 35 kg/m2? 0  Age over 70 years old? 1  Neck circumference greater than 40 cm/16 inches? 1  Gender: 1  Obstructive Sleep Apnea Score 4  Score 4 or greater  Results sent to PCP

## 2013-12-09 NOTE — Progress Notes (Addendum)
Pt states he is going this afternoon to have is [facility]. He is aware that his surgery could be cancelled if that level is not where it needs to be.   Stop Bang Assessment sent to Dr. Shella Spearinghrismon.

## 2013-12-09 NOTE — Progress Notes (Signed)
Pt called with time change. To arrive at 900. spoke with pt

## 2013-12-10 ENCOUNTER — Encounter (HOSPITAL_COMMUNITY): Payer: Self-pay | Admitting: *Deleted

## 2013-12-10 ENCOUNTER — Telehealth: Payer: Self-pay | Admitting: Surgery

## 2013-12-10 ENCOUNTER — Ambulatory Visit (HOSPITAL_COMMUNITY)
Admission: RE | Admit: 2013-12-10 | Discharge: 2013-12-10 | Disposition: A | Discharge status: Home / Self Care | Payer: Medicare Other | Source: Ambulatory Visit | Attending: Surgery | Admitting: Surgery

## 2013-12-10 ENCOUNTER — Encounter (HOSPITAL_COMMUNITY)
Admission: RE | Disposition: A | Discharge status: Home / Self Care | Payer: Self-pay | Source: Ambulatory Visit | Attending: Surgery

## 2013-12-10 ENCOUNTER — Ambulatory Visit (HOSPITAL_COMMUNITY): Payer: Medicare Other | Admitting: Anesthesiology

## 2013-12-10 ENCOUNTER — Encounter (HOSPITAL_COMMUNITY): Payer: Medicare Other | Admitting: Anesthesiology

## 2013-12-10 DIAGNOSIS — I70262 Atherosclerosis of native arteries of extremities with gangrene, left leg: Secondary | ICD-10-CM | POA: Diagnosis not present

## 2013-12-10 DIAGNOSIS — I739 Peripheral vascular disease, unspecified: Secondary | ICD-10-CM

## 2013-12-10 DIAGNOSIS — F17290 Nicotine dependence, other tobacco product, uncomplicated: Secondary | ICD-10-CM | POA: Diagnosis not present

## 2013-12-10 DIAGNOSIS — M6289 Other specified disorders of muscle: Secondary | ICD-10-CM | POA: Diagnosis not present

## 2013-12-10 DIAGNOSIS — I96 Gangrene, not elsewhere classified: Secondary | ICD-10-CM | POA: Insufficient documentation

## 2013-12-10 DIAGNOSIS — I2510 Atherosclerotic heart disease of native coronary artery without angina pectoris: Secondary | ICD-10-CM | POA: Diagnosis not present

## 2013-12-10 DIAGNOSIS — Z87891 Personal history of nicotine dependence: Secondary | ICD-10-CM | POA: Diagnosis not present

## 2013-12-10 HISTORY — PX: AMPUTATION: SHX166

## 2013-12-10 LAB — COMPREHENSIVE METABOLIC PANEL
ALK PHOS: 92 U/L (ref 39–117)
ALT: 21 U/L (ref 0–53)
ANION GAP: 13 (ref 5–15)
AST: 19 U/L (ref 0–37)
Albumin: 3.3 g/dL — ABNORMAL LOW (ref 3.5–5.2)
BILIRUBIN TOTAL: 1.5 mg/dL — AB (ref 0.3–1.2)
BUN: 11 mg/dL (ref 6–23)
CHLORIDE: 106 meq/L (ref 96–112)
CO2: 23 mEq/L (ref 19–32)
Calcium: 9.5 mg/dL (ref 8.4–10.5)
Creatinine, Ser: 1 mg/dL (ref 0.50–1.35)
GFR, EST AFRICAN AMERICAN: 86 mL/min — AB (ref >90)
GFR, EST NON AFRICAN AMERICAN: 74 mL/min — AB (ref >90)
GLUCOSE: 101 mg/dL — AB (ref 70–99)
Potassium: 3.8 mEq/L (ref 3.7–5.3)
Sodium: 142 mEq/L (ref 137–147)
Total Protein: 7 g/dL (ref 6.0–8.3)

## 2013-12-10 LAB — CBC
HEMATOCRIT: 41.4 % (ref 39.0–52.0)
HEMOGLOBIN: 13.5 g/dL (ref 13.0–17.0)
MCH: 30.1 pg (ref 26.0–34.0)
MCHC: 32.6 g/dL (ref 30.0–36.0)
MCV: 92.4 fL (ref 78.0–100.0)
Platelets: 248 10*3/uL (ref 150–400)
RBC: 4.48 MIL/uL (ref 4.22–5.81)
RDW: 14.2 % (ref 11.5–15.5)
WBC: 5 10*3/uL (ref 4.0–10.5)

## 2013-12-10 LAB — PROTIME-INR
INR: 1.25 (ref 0.00–1.49)
Prothrombin Time: 15.8 seconds — ABNORMAL HIGH (ref 11.6–15.2)

## 2013-12-10 LAB — APTT: aPTT: 35 seconds (ref 24–37)

## 2013-12-10 SURGERY — AMPUTATION DIGIT
Anesthesia: General | Site: Toe | Laterality: Left

## 2013-12-10 MED ORDER — FENTANYL CITRATE 0.05 MG/ML IJ SOLN
INTRAMUSCULAR | Status: AC
  Filled 2013-12-10: qty 5

## 2013-12-10 MED ORDER — MIDAZOLAM HCL 2 MG/2ML IJ SOLN
INTRAMUSCULAR | Status: AC
  Filled 2013-12-10: qty 2

## 2013-12-10 MED ORDER — 0.9 % SODIUM CHLORIDE (POUR BTL) OPTIME
TOPICAL | Status: DC | PRN
  Administered 2013-12-10: 1000 mL

## 2013-12-10 MED ORDER — PROPOFOL 10 MG/ML IV BOLUS
INTRAVENOUS | Status: DC | PRN
  Administered 2013-12-10: 30 mg via INTRAVENOUS
  Administered 2013-12-10: 150 mg via INTRAVENOUS

## 2013-12-10 MED ORDER — CHLORHEXIDINE GLUCONATE CLOTH 2 % EX PADS: 6.0000 | MEDICATED_PAD | Freq: Once | CUTANEOUS | Status: DC

## 2013-12-10 MED ORDER — OXYCODONE HCL 5 MG PO TABS
5.0000 mg | ORAL_TABLET | ORAL | Status: DC | PRN
  Administered 2013-12-10: 10 mg via ORAL

## 2013-12-10 MED ORDER — FENTANYL CITRATE 0.05 MG/ML IJ SOLN: 25.0000 ug | INTRAMUSCULAR | Status: DC | PRN

## 2013-12-10 MED ORDER — NEOSTIGMINE METHYLSULFATE 10 MG/10ML IV SOLN
INTRAVENOUS | Status: AC
  Filled 2013-12-10: qty 1

## 2013-12-10 MED ORDER — FENTANYL CITRATE 0.05 MG/ML IJ SOLN
INTRAMUSCULAR | Status: DC | PRN
  Administered 2013-12-10 (×3): 50 ug via INTRAVENOUS

## 2013-12-10 MED ORDER — ONDANSETRON HCL 4 MG/2ML IJ SOLN
INTRAMUSCULAR | Status: DC | PRN
  Administered 2013-12-10: 4 mg via INTRAVENOUS

## 2013-12-10 MED ORDER — LIDOCAINE HCL (CARDIAC) 20 MG/ML IV SOLN
INTRAVENOUS | Status: DC | PRN
  Administered 2013-12-10: 50 mg via INTRAVENOUS

## 2013-12-10 MED ORDER — OXYCODONE HCL 5 MG PO TABS
ORAL_TABLET | ORAL | Status: AC
  Filled 2013-12-10: qty 2

## 2013-12-10 MED ORDER — DEXAMETHASONE SODIUM PHOSPHATE 4 MG/ML IJ SOLN
INTRAMUSCULAR | Status: DC | PRN
  Administered 2013-12-10: 4 mg via INTRAVENOUS

## 2013-12-10 MED ORDER — PROTAMINE SULFATE 10 MG/ML IV SOLN
INTRAVENOUS | Status: AC
  Filled 2013-12-10: qty 5

## 2013-12-10 MED ORDER — PROPOFOL 10 MG/ML IV BOLUS
INTRAVENOUS | Status: AC
  Filled 2013-12-10: qty 20

## 2013-12-10 MED ORDER — LACTATED RINGERS IV SOLN
INTRAVENOUS | Status: DC
  Administered 2013-12-10: 10:00:00 via INTRAVENOUS

## 2013-12-10 MED ORDER — LACTATED RINGERS IV SOLN
INTRAVENOUS | Status: DC | PRN
  Administered 2013-12-10 (×2): via INTRAVENOUS

## 2013-12-10 MED ORDER — PHENYLEPHRINE HCL 10 MG/ML IJ SOLN
INTRAMUSCULAR | Status: DC | PRN
  Administered 2013-12-10: 80 ug via INTRAVENOUS
  Administered 2013-12-10: 120 ug via INTRAVENOUS

## 2013-12-10 MED ORDER — OXYCODONE HCL 5 MG PO TABS: 5.0000 mg | ORAL_TABLET | ORAL | Status: DC | PRN

## 2013-12-10 MED ORDER — HEPARIN SODIUM (PORCINE) 1000 UNIT/ML IJ SOLN
INTRAMUSCULAR | Status: AC
  Filled 2013-12-10: qty 1

## 2013-12-10 SURGICAL SUPPLY — 35 items
BANDAGE ELASTIC 4 VELCRO ST LF (GAUZE/BANDAGES/DRESSINGS) ×3 IMPLANT
BLADE AVERAGE 25MMX9MM (BLADE)
BLADE AVERAGE 25X9 (BLADE) IMPLANT
BLADE SAW RECIP 87.9 MT (BLADE) IMPLANT
BNDG CONFORM 3 STRL LF (GAUZE/BANDAGES/DRESSINGS) IMPLANT
BNDG GAUZE ELAST 4 BULKY (GAUZE/BANDAGES/DRESSINGS) ×3 IMPLANT
CANISTER SUCTION 2500CC (MISCELLANEOUS) ×3 IMPLANT
COVER SURGICAL LIGHT HANDLE (MISCELLANEOUS) ×3 IMPLANT
DRAPE EXTREMITY T 121X128X90 (DRAPE) ×3 IMPLANT
DRSG EMULSION OIL 3X3 NADH (GAUZE/BANDAGES/DRESSINGS) ×3 IMPLANT
ELECT REM PT RETURN 9FT ADLT (ELECTROSURGICAL) ×3
ELECTRODE REM PT RTRN 9FT ADLT (ELECTROSURGICAL) ×1 IMPLANT
GAUZE SPONGE 4X4 12PLY STRL (GAUZE/BANDAGES/DRESSINGS) ×3 IMPLANT
GLOVE BIOGEL PI IND STRL 7.5 (GLOVE) ×1 IMPLANT
GLOVE BIOGEL PI INDICATOR 7.5 (GLOVE) ×2
GLOVE SURG SS PI 7.5 STRL IVOR (GLOVE) ×3 IMPLANT
GOWN STRL REUS W/ TWL LRG LVL3 (GOWN DISPOSABLE) ×2 IMPLANT
GOWN STRL REUS W/ TWL XL LVL3 (GOWN DISPOSABLE) ×1 IMPLANT
GOWN STRL REUS W/TWL LRG LVL3 (GOWN DISPOSABLE) ×4
GOWN STRL REUS W/TWL XL LVL3 (GOWN DISPOSABLE) ×2
KIT BASIN OR (CUSTOM PROCEDURE TRAY) ×3 IMPLANT
KIT ROOM TURNOVER OR (KITS) ×3 IMPLANT
NEEDLE HYPO 25GX1X1/2 BEV (NEEDLE) IMPLANT
NS IRRIG 1000ML POUR BTL (IV SOLUTION) ×3 IMPLANT
PACK GENERAL/GYN (CUSTOM PROCEDURE TRAY) ×3 IMPLANT
PAD ARMBOARD 7.5X6 YLW CONV (MISCELLANEOUS) ×6 IMPLANT
SPECIMEN JAR SMALL (MISCELLANEOUS) ×3 IMPLANT
SPONGE GAUZE 4X4 12PLY STER LF (GAUZE/BANDAGES/DRESSINGS) ×3 IMPLANT
SUT ETHILON 3 0 PS 1 (SUTURE) ×3 IMPLANT
SYR CONTROL 10ML LL (SYRINGE) IMPLANT
TOWEL OR 17X24 6PK STRL BLUE (TOWEL DISPOSABLE) ×3 IMPLANT
TOWEL OR 17X26 10 PK STRL BLUE (TOWEL DISPOSABLE) ×3 IMPLANT
TUBE ANAEROBIC SPECIMEN COL (MISCELLANEOUS) IMPLANT
UNDERPAD 30X30 INCONTINENT (UNDERPADS AND DIAPERS) ×3 IMPLANT
WATER STERILE IRR 1000ML POUR (IV SOLUTION) ×3 IMPLANT

## 2013-12-10 NOTE — Anesthesia Preprocedure Evaluation (Addendum)
Anesthesia Evaluation  Patient identified by MRN, date of birth, ID band Patient awake    Reviewed: Allergy & Precautions, H&P , NPO status , Patient's Chart, lab work & pertinent test results, reviewed documented beta blocker date and time   History of Anesthesia Complications Negative for: history of anesthetic complications  Airway Mallampati: II  TM Distance: >3 FB Neck ROM: Full    Dental no notable dental hx. (+) Teeth Intact, Dental Advisory Given   Pulmonary neg pulmonary ROS, former smoker,  breath sounds clear to auscultation  Pulmonary exam normal       Cardiovascular hypertension, Pt. on medications and Pt. on home beta blockers + CAD, + Past MI, + Cardiac Stents and + Peripheral Vascular Disease Rhythm:Regular Rate:Normal     Neuro/Psych negative neurological ROS  negative psych ROS   GI/Hepatic negative GI ROS, Neg liver ROS,   Endo/Other  negative endocrine ROS  Renal/GU Renal InsufficiencyRenal disease  negative genitourinary   Musculoskeletal   Abdominal   Peds  Hematology negative hematology ROS (+)   Anesthesia Other Findings   Reproductive/Obstetrics negative OB ROS                           Anesthesia Physical Anesthesia Plan  ASA: III  Anesthesia Plan: General   Post-op Pain Management:    Induction: Intravenous  Airway Management Planned: LMA  Additional Equipment:   Intra-op Plan:   Post-operative Plan: Extubation in OR  Informed Consent: I have reviewed the patients History and Physical, chart, labs and discussed the procedure including the risks, benefits and alternatives for the proposed anesthesia with the patient or authorized representative who has indicated his/her understanding and acceptance.   Dental advisory given  Plan Discussed with: CRNA  Anesthesia Plan Comments:         Anesthesia Quick Evaluation

## 2013-12-10 NOTE — H&P (View-Only) (Signed)
Patient name: [name] [name] MRN: [id number] DOB: 04/18/1945 Sex: male     Chief Complaint  Patient presents with  . Follow-up    Discuss surgery    HISTORY OF PRESENT ILLNESS: The patient comes in today for followup. He initially underwent endovascular abdominal aortic aneurysm repair in October of 2011. He has a known type II endoleak, however his aneurysm continues to decrease. He is also status post endovascular repair of a left popliteal artery aneurysm on October 2011.  He presented to the office on 10/21/2013 with a six-day history of pain and coldness in his left foot. He was found to have an occluded popliteal stent by angiography the following day. An attempt at from a lysis was performed, however the patient developed some cardiac issues as well as bleeding, and this had to be discontinued. The patient continued to have an ischemic foot and therefore he was taken to the operating room for revascularization. On 10/29/2013, he underwent femoral to posterior tibial artery bypass graft. He had an excellent caliber vein. The bypass graft occluded early and he was taken back. The bypass was safe to be re-opened. I explored the posterior tibial artery behind the ankle. I passed catheters all the way into the pedal arch and injected TPA. The patient was transitioned to oral anticoagulation and ultimately discharged. While he was in the hospital he did bump his toe and has developed a area of erythema and black eschar on the fourth and fifth digits. He has been on antibiotics.    Past Medical History  Diagnosis Date  . Abdominal aortic aneurysm   . Popliteal aneurysm   . Hyperlipidemia   . Hypertension   . Coronary artery disease   . Detached retina   . Myocardial infarction     2007 heart stent  . Chronic kidney disease     Right kidney stone    Past Surgical History  Procedure Laterality Date  . Coronary stenting      2007  . Abdominal aortic aneurysm repair  11/24/09    EVAR  . Popliteal artery stent  11/2009    left popliteal artery   . Retinal detachment surgery Right 2011  . Tooth extraction  June 2015    Wisdom Tooth Extraction  . Femoral-popliteal bypass graft Left 10/29/2013    Procedure: BYPASS GRAFT FEMORAL-POPLITEAL ARTERY;  Surgeon: Nada LibmanVance W Brabham, MD;  Location: [facility] OR;  Service: Vascular;  Laterality: Left;  Left Femoral to Posterior Tibial artery bypass graft using nonreversed saphenous vein. and thrombectomy of Tibial arterly.  . Femoral-tibial bypass graft Left 10/30/2013    Procedure: Reexploration of Femoral-Posterior Tibial Bypass; Thrombectomy of Bypass; Left Leg Angiogram; Posterior Tibial Vein Patch Angioplasty;  Surgeon: Nada LibmanVance W Brabham, MD;  [facility];  Service: Vascular;  Laterality: Left;    History   Social History  . Marital Status: Married    Spouse Name: N/A    Number of Children: N/A  . Years of Education: N/A   Occupational History  . Retired    Social History Main Topics  . Smoking status: Former Smoker -- 15 years    Types: Cigarettes    Quit date: 09/21/1985  . Smokeless tobacco: Current User    Types: Chew  . Alcohol Use: No  . Drug Use: No  . Sexual Activity: Not on file   Other Topics Concern  . Not on file   Social History Narrative   Lives with family.  Family History  Problem Relation Age of Onset  . Aneurysm Sister     possibly   . Peripheral vascular disease Brother     Allergies as of 12/07/2013 - Review Complete 12/07/2013  Allergen Reaction Noted  . Omnipaque [iohexol] Rash 03/27/2010  . Oxytetracycline Rash 10/11/2009    Current Outpatient Prescriptions on File Prior to Visit  Medication Sig Dispense Refill  . aspirin 81 MG EC tablet Take 81 mg by mouth daily.        Marland Kitchen. atorvastatin (LIPITOR) 40 MG tablet Take 40 mg by mouth daily.      . cephALEXin (KEFLEX) 500 MG capsule Take 1 capsule (500 mg total) by mouth 3 (three) times daily.  42 capsule  1  . HYDROmorphone  (DILAUDID) 2 MG tablet Take 2 mg by mouth every 6 (six) hours as needed for severe pain. Count 40, 1-2 tablets q 6hrs PRN      . levofloxacin (LEVAQUIN) 500 MG tablet Take 1 tablet (500 mg total) by mouth daily.  10 tablet  0  . metoprolol tartrate (LOPRESSOR) 25 MG tablet Take 25 mg by mouth 2 (two) times daily.       . Omega-3 Fatty Acids (FISH OIL TRIPLE STRENGTH) 1400 MG CAPS Take 2,800 mg by mouth 2 (two) times daily.      Marland Kitchen. oxyCODONE (ROXICODONE) 5 MG immediate release tablet Take 1 tablet (5 mg total) by mouth every 6 (six) hours as needed.  30 tablet  0  . Saw Palmetto 450 MG CAPS Take 900 mg by mouth 2 (two) times daily.      Marland Kitchen. warfarin (COUMADIN) 10 MG tablet Take 1 tablet (10 mg total) by mouth daily.  30 tablet  2  . warfarin (COUMADIN) 2.5 MG tablet Take 1 tablet (2.5 mg total) by mouth daily.  30 tablet  2   No current facility-administered medications on file prior to visit.      PHYSICAL EXAMINATION:   Vital signs are BP 132/77  Pulse 86  Temp(Src) 98.4 F (36.9 C) (Oral)  Resp 16  Ht 6\' 2"  (1.88 m)  Wt 187 lb 9.6 oz (85.095 kg)  BMI 24.08 kg/m2  SpO2 98% General: The patient appears their stated age. HEENT:  No gross abnormalities Pulmonary:  Non labored breathing Musculoskeletal: There are no major deformities. Neurologic: No focal weakness or paresthesias are detected, Psychiatric: The patient has normal affect. Cardiovascular: Nonpalpable left leg pulses.  Dry gangrene 2.  Fifth digits.  Erythema has improved.   Diagnostic Studies none  Assessment: Ischemic toes Plan: Discussed with patient and wife the need to proceed with amputation of his fourth and fifth toes.  He understands he may require a more proximal amputation.  He is on Coumadin.  His most recent INR was 3.1.  I am going to stop his Coumadin today.  I will check his INR on Thursday, October 29.  If it is less than 2.0 I will proceed with amputation of the left fourth and fifth toe.  He  potentially will be admitted overnight versus going home.  He will need a Darco shoe if he goes home   V. Charlena CrossWells Brabham IV, M.D. [facility] [place] Office: [phone] Pager:  [phone]

## 2013-12-10 NOTE — Telephone Encounter (Addendum)
Message copied by Fredrich BirksMILLIKAN, DANA P on Thu Dec 10, 2013  2:30 PM ------      Message from: [place]CCHESNEY, [place] K      Created: Thu Dec 10, 2013  1:12 PM      Regarding: Schedule                   ----- Message -----         From: Nada LibmanVance W Brabham, MD         Sent: 12/10/2013  11:39 AM           To: [facility] Charge Pool            12/10/2013:            Surgeon:  Jorge NyBRABHAM IV, V. WELLS      Assistants:  None      Procedure:   April dictation (Ray), left fourth and fifth toe                  Please have him follow-up in my clinic on Monday ------  12/10/13: left msg for pt with appt time, dpm

## 2013-12-10 NOTE — Interval H&P Note (Signed)
History and Physical Interval Note:  12/10/2013 9:51 AM  [name] [name]  has presented today for surgery, with the diagnosis of Ischemic toes M62.89  The various methods of treatment have been discussed with the patient and family. After consideration of risks, benefits and other options for treatment, the patient has consented to  Procedure(s): AMPUTATION FOURTH AND FIFTH TOES (Left) as a surgical intervention .  The patient's history has been reviewed, patient examined, no change in status, stable for surgery.  I have reviewed the patient's chart and labs.  Questions were answered to the patient's satisfaction.     BRABHAM IV, V. WELLS

## 2013-12-10 NOTE — Progress Notes (Signed)
Orthopedic Tech Progress Note Patient Details:  [name] [name] 10/31/1945 [id number]  Ortho Devices Type of Ortho Device: Darco shoe Ortho Device/Splint Location: lle Ortho Device/Splint Interventions: Application   [name] 12/10/2013, 1:49 PM

## 2013-12-10 NOTE — Anesthesia Postprocedure Evaluation (Signed)
  Anesthesia Post-op Note  Patient: [name] [name]  Procedure(s) Performed: Procedure(s): AMPUTATION LEFT  FOURTH AND FIFTH TOES (Left)  Patient Location: PACU  Anesthesia Type:General  Level of Consciousness: awake and alert   Airway and Oxygen Therapy: Patient Spontanous Breathing  Post-op Pain: none  Post-op Assessment: Post-op Vital signs reviewed, Patient's Cardiovascular Status Stable and Respiratory Function Stable  Post-op Vital Signs: Reviewed  Filed Vitals:   12/10/13 1232  BP: 131/73  Pulse: 72  Temp:   Resp: 15    Complications: No apparent anesthesia complications

## 2013-12-10 NOTE — Op Note (Signed)
    Patient name: [name] [name] MRN: [id number] DOB: 06/26/1945 Sex: male  12/10/2013 Pre-operative Diagnosis: Dry gangrene, left fourth and fifth toe Post-operative diagnosis:  Same Surgeon:  Jorge NyBRABHAM IV, V. WELLS Assistants:  None Procedure:   April dictation (Ray), left fourth and fifth toe Anesthesia:  Gen. Blood Loss:  See anesthesia record Specimens:  None  Findings:  Adequate bleeding at amputation site.  Bone was not infected  Indications:  Patient has undergone multiple attempts at revascularization of his left leg following occlusion of his stent which was used to treat his popliteal aneurysm.  He ultimately underwent a femoral posterior tibial bypass graft.  He has developed a wound on his fourth and fifth toe which have become dry gangrene.  He was treated with antibiotics for 2 weeks secondary to cellulitis.  He is here today for amputation.  Procedure:  The patient was identified in the holding area and [facility]  The patient was then placed supine on the table. general anesthesia was administered.  The patient was prepped and draped in the usual sterile fashion.  A time out was called and antibiotics were administered.  An elliptical incision was made around the base of the left fourth and fifth toe.  A 10 blade was used to cut down to the bone which was then transected with large bone cutters.  Aundria RudRogers were used to debride back the bone.  The bone was brittle.  There was no gross evidence of infection.  There was actually relatively good bleeding at the amputation site.  The wound was irrigated and the incisions were closed with interrupted vertical mattress 3-0 nylon sutures.  Sterile dressings were applied.   Disposition:  To PACU in stable condition.   Juleen ChinaV. Wells Brabham, M.D. [facility] [place] Office: [phone] Pager:  [phone]

## 2013-12-10 NOTE — Transfer of Care (Signed)
Immediate Anesthesia Transfer of Care Note  Patient: [name] [name]  Procedure(s) Performed: Procedure(s): AMPUTATION LEFT  FOURTH AND FIFTH TOES (Left)  Patient Location: PACU  Anesthesia Type:General  Level of Consciousness: awake, oriented and patient cooperative  Airway & Oxygen Therapy: Patient Spontanous Breathing and Patient connected to nasal cannula oxygen  Post-op Assessment: Report given to PACU RN and Post -op Vital signs reviewed and stable  Post vital signs: Reviewed  Complications: No apparent anesthesia complications

## 2013-12-10 NOTE — Anesthesia Procedure Notes (Signed)
Procedure Name: LMA Insertion Date/Time: 12/10/2013 11:02 AM Performed by: Romie MinusOCK, Ervie Mccard K Pre-anesthesia Checklist: Patient identified, Emergency Drugs available, Suction available, Patient being monitored and Timeout performed Patient Re-evaluated:Patient Re-evaluated prior to inductionOxygen Delivery Method: Circle system utilized Preoxygenation: Pre-oxygenation with 100% oxygen Intubation Type: IV induction Ventilation: Mask ventilation without difficulty LMA: LMA inserted LMA Size: 5.0 Number of attempts: 1 Placement Confirmation: positive ETCO2,  CO2 detector and breath sounds checked- equal and bilateral Tube secured with: Tape Dental Injury: Teeth and Oropharynx as per pre-operative assessment

## 2013-12-11 ENCOUNTER — Telehealth: Payer: Self-pay | Admitting: Surgery

## 2013-12-11 ENCOUNTER — Encounter: Payer: Self-pay | Admitting: Surgery

## 2013-12-11 ENCOUNTER — Encounter (HOSPITAL_COMMUNITY): Payer: Self-pay | Admitting: Surgery

## 2013-12-11 NOTE — Telephone Encounter (Signed)
Message copied by Fredrich BirksMILLIKAN, DANA P on Fri Dec 11, 2013  2:11 PM ------      Message from: [place], [place] K      Created: Thu Dec 10, 2013  3:25 PM      Regarding: schedule                   ----- Message -----         From: Lars MageEmma M Collins, PA-C         Sent: 12/10/2013   2:29 PM           To: [facility] Charge Pool            F/U in 1 week with Dr. Myra GianottiBrabham for wound check s/p  toe amputation ------

## 2013-12-14 ENCOUNTER — Ambulatory Visit (INDEPENDENT_AMBULATORY_CARE_PROVIDER_SITE_OTHER): Payer: Self-pay | Admitting: Surgery

## 2013-12-14 ENCOUNTER — Encounter: Payer: Self-pay | Admitting: Surgery

## 2013-12-14 VITALS — BP 119/67 | HR 76 | Temp 98.1°F | Ht 74.0 in | Wt 194.4 lb

## 2013-12-14 DIAGNOSIS — I739 Peripheral vascular disease, unspecified: Secondary | ICD-10-CM

## 2013-12-14 NOTE — Progress Notes (Signed)
The patient comes in today for followup. He initially underwent endovascular abdominal aortic aneurysm repair in October of 2011. He has a known type II endoleak, however his aneurysm continues to decrease. He is also status post endovascular repair of a left popliteal artery aneurysm on October 2011.  He presented to the office on 10/21/2013 with a six-day history of pain and coldness in his left foot. He was found to have an occluded popliteal stent by angiography the following day. An attempt at from a lysis was performed, however the patient developed some cardiac issues as well as bleeding, and this had to be discontinued. The patient continued to have an ischemic foot and therefore he was taken to the operating room for revascularization. On 10/29/2013, he underwent femoral to posterior tibial artery bypass graft. He had an excellent caliber vein. The bypass graft occluded early and he was taken back. The bypass was safe to be re-opened. I explored the posterior tibial artery behind the ankle. I passed catheters all the way into the pedal arch and injected TPA. The patient was transitioned to oral anticoagulation and ultimately discharged. While he was in the hospital he did bump his toe and developed an area of erythema and black eschar on the fourth and fifth digits. He was treated with antibiotics.  On 12/10/2013 he underwent amputation of the left fourth and fifth toes.  There was actually adequate bleeding at the time of his operation.  The bone was brittle and healthy.  He was discharged home the same day.  He is back today for follow-up.  On examination the incision appears to be holding intact.  There is some reactive erythema at the incision but otherwise it actually looks very good.  I have scheduled the patient to follow-up in 2 weeks for a wound check.  He was instructed to keep this area clean.  I also discussed the importance of minimizing any contact or weightbearing on this part of his  foot as this is a very tenuous wound.

## 2013-12-15 DIAGNOSIS — I749 Embolism and thrombosis of unspecified artery: Secondary | ICD-10-CM | POA: Diagnosis not present

## 2013-12-15 DIAGNOSIS — Z89422 Acquired absence of other left toe(s): Secondary | ICD-10-CM | POA: Diagnosis not present

## 2013-12-15 DIAGNOSIS — Z7901 Long term (current) use of anticoagulants: Secondary | ICD-10-CM | POA: Diagnosis not present

## 2013-12-17 DIAGNOSIS — I749 Embolism and thrombosis of unspecified artery: Secondary | ICD-10-CM | POA: Diagnosis not present

## 2013-12-17 DIAGNOSIS — Z9889 Other specified postprocedural states: Secondary | ICD-10-CM | POA: Diagnosis not present

## 2013-12-17 DIAGNOSIS — Z7901 Long term (current) use of anticoagulants: Secondary | ICD-10-CM | POA: Diagnosis not present

## 2013-12-22 DIAGNOSIS — Z9889 Other specified postprocedural states: Secondary | ICD-10-CM | POA: Diagnosis not present

## 2013-12-22 DIAGNOSIS — Z7901 Long term (current) use of anticoagulants: Secondary | ICD-10-CM | POA: Diagnosis not present

## 2013-12-22 DIAGNOSIS — I749 Embolism and thrombosis of unspecified artery: Secondary | ICD-10-CM | POA: Diagnosis not present

## 2013-12-25 ENCOUNTER — Encounter: Payer: Self-pay | Admitting: Surgery

## 2013-12-28 ENCOUNTER — Encounter: Payer: Self-pay | Admitting: Surgery

## 2013-12-28 ENCOUNTER — Ambulatory Visit (INDEPENDENT_AMBULATORY_CARE_PROVIDER_SITE_OTHER): Payer: Medicare Other | Admitting: Surgery

## 2013-12-28 VITALS — BP 119/65 | HR 65 | Ht 74.0 in | Wt 191.0 lb

## 2013-12-28 DIAGNOSIS — G8918 Other acute postprocedural pain: Secondary | ICD-10-CM

## 2013-12-28 MED ORDER — OXYCODONE HCL 5 MG PO TABS: 5.0000 mg | ORAL_TABLET | Freq: Four times a day (QID) | ORAL | Status: DC | PRN

## 2013-12-28 NOTE — Progress Notes (Signed)
The patient comes in today for followup. He initially underwent endovascular abdominal aortic aneurysm repair in October of 2011. He has a known type II endoleak, however his aneurysm continues to decrease. He is also status post endovascular repair of a left popliteal artery aneurysm on October 2011.  He presented to the office on 10/21/2013 with a six-day history of pain and coldness in his left foot. He was found to have an occluded popliteal stent by angiography the following day. An attempt at from a lysis was performed, however the patient developed some cardiac issues as well as bleeding, and this had to be discontinued. The patient continued to have an ischemic foot and therefore he was taken to the operating room for revascularization. On 10/29/2013, he underwent femoral to posterior tibial artery bypass graft. He had an excellent caliber vein. The bypass graft occluded early and he was taken back. The bypass was safe to be re-opened. I explored the posterior tibial artery behind the ankle. I passed catheters all the way into the pedal arch and injected TPA. The patient was transitioned to oral anticoagulation and ultimately discharged. While he was in the hospital he did bump his toe and developed an area of erythema and black eschar on the fourth and fifth digits. He was treated with antibiotics.  On 12/10/2013 he underwent amputation of the left fourth and fifth toes. There was actually adequate bleeding at the time of his operation. The bone was brittle and healthy. He was discharged home the same day.  His amputation site today looks very good.  I'm going to remove the stitches.  There is a little bit of drainage at the distal aspect of the fifth toe amputation site.  His great toe also looks healthy.  The patient will get 30 oxycodone 5 mg tablets a day.  I will see him back in a month

## 2014-01-05 DIAGNOSIS — Z9889 Other specified postprocedural states: Secondary | ICD-10-CM | POA: Diagnosis not present

## 2014-01-05 DIAGNOSIS — I749 Embolism and thrombosis of unspecified artery: Secondary | ICD-10-CM | POA: Diagnosis not present

## 2014-01-05 DIAGNOSIS — Z7901 Long term (current) use of anticoagulants: Secondary | ICD-10-CM | POA: Diagnosis not present

## 2014-01-22 ENCOUNTER — Encounter: Payer: Self-pay | Admitting: Surgery

## 2014-01-25 ENCOUNTER — Encounter: Payer: Self-pay | Admitting: Surgery

## 2014-01-25 ENCOUNTER — Ambulatory Visit (INDEPENDENT_AMBULATORY_CARE_PROVIDER_SITE_OTHER): Payer: Medicare Other | Admitting: Surgery

## 2014-01-25 VITALS — BP 114/74 | HR 64 | Temp 98.0°F | Ht 74.0 in | Wt 195.6 lb

## 2014-01-25 DIAGNOSIS — Z48812 Encounter for surgical aftercare following surgery on the circulatory system: Secondary | ICD-10-CM

## 2014-01-25 DIAGNOSIS — I724 Aneurysm of artery of lower extremity: Secondary | ICD-10-CM

## 2014-01-25 NOTE — Addendum Note (Signed)
Addended by: Sharee PimpleMCCHESNEY, MARILYN K on: 01/25/2014 02:51 PM   Modules accepted: Orders

## 2014-01-25 NOTE — Progress Notes (Signed)
The patient comes in today for followup. He initially underwent endovascular abdominal aortic aneurysm repair in October of 2011. He has a known type II endoleak, however his aneurysm continues to decrease. He is also status post endovascular repair of a left popliteal artery aneurysm on October 2011.  He presented to the office on 10/21/2013 with a six-day history of pain and coldness in his left foot. He was found to have an occluded popliteal stent by angiography the following day. An attempt at from a lysis was performed, however the patient developed some cardiac issues as well as bleeding, and this had to be discontinued. The patient continued to have an ischemic foot and therefore he was taken to the operating room for revascularization. On 10/29/2013, he underwent femoral to posterior tibial artery bypass graft. He had an excellent caliber vein. The bypass graft occluded early and he was taken back. The bypass was safe to be re-opened. I explored the posterior tibial artery behind the ankle. I passed catheters all the way into the pedal arch and injected TPA. The patient was transitioned to oral anticoagulation and ultimately discharged. While he was in the hospital he did bump his toe and developed an area of erythema and black eschar on the fourth and fifth digits. He was treated with antibiotics.  On 12/10/2013 he underwent amputation of the left fourth and fifth toes. There was actually adequate bleeding at the time of his operation. The bone was brittle and healthy. He was discharged home the same day.  His toe amputation site has completely healed.  The eschar on the tip of his right great toe has dramatically improved.  There is no evidence of infection.  All of his wounds look good.   He is scheduled to come back in 3 months for a duplex of his bypass graft.

## 2014-01-31 ENCOUNTER — Other Ambulatory Visit (HOSPITAL_COMMUNITY): Payer: Self-pay | Admitting: Cardiovascular Disease

## 2014-02-02 DIAGNOSIS — I749 Embolism and thrombosis of unspecified artery: Secondary | ICD-10-CM | POA: Diagnosis not present

## 2014-02-02 DIAGNOSIS — Z9889 Other specified postprocedural states: Secondary | ICD-10-CM | POA: Diagnosis not present

## 2014-02-02 DIAGNOSIS — Z7901 Long term (current) use of anticoagulants: Secondary | ICD-10-CM | POA: Diagnosis not present

## 2014-02-16 DIAGNOSIS — I749 Embolism and thrombosis of unspecified artery: Secondary | ICD-10-CM | POA: Diagnosis not present

## 2014-02-16 DIAGNOSIS — Z9889 Other specified postprocedural states: Secondary | ICD-10-CM | POA: Diagnosis not present

## 2014-02-16 DIAGNOSIS — Z7901 Long term (current) use of anticoagulants: Secondary | ICD-10-CM | POA: Diagnosis not present

## 2014-03-02 ENCOUNTER — Other Ambulatory Visit (HOSPITAL_COMMUNITY): Payer: Self-pay | Admitting: Cardiovascular Disease

## 2014-03-09 DIAGNOSIS — I749 Embolism and thrombosis of unspecified artery: Secondary | ICD-10-CM | POA: Diagnosis not present

## 2014-03-09 DIAGNOSIS — Z9889 Other specified postprocedural states: Secondary | ICD-10-CM | POA: Diagnosis not present

## 2014-03-09 DIAGNOSIS — Z7901 Long term (current) use of anticoagulants: Secondary | ICD-10-CM | POA: Diagnosis not present

## 2014-03-30 DIAGNOSIS — I749 Embolism and thrombosis of unspecified artery: Secondary | ICD-10-CM | POA: Diagnosis not present

## 2014-03-30 DIAGNOSIS — Z7901 Long term (current) use of anticoagulants: Secondary | ICD-10-CM | POA: Diagnosis not present

## 2014-03-30 DIAGNOSIS — Z9889 Other specified postprocedural states: Secondary | ICD-10-CM | POA: Diagnosis not present

## 2014-04-02 ENCOUNTER — Other Ambulatory Visit: Payer: Self-pay

## 2014-04-02 MED ORDER — ATORVASTATIN CALCIUM 40 MG PO TABS: ORAL_TABLET | ORAL | Status: DC

## 2014-04-23 ENCOUNTER — Encounter: Payer: Self-pay | Admitting: Surgery

## 2014-04-26 ENCOUNTER — Ambulatory Visit (INDEPENDENT_AMBULATORY_CARE_PROVIDER_SITE_OTHER)
Admission: RE | Admit: 2014-04-26 | Discharge: 2014-04-26 | Disposition: A | Discharge status: Home / Self Care | Payer: Medicare Other | Source: Ambulatory Visit | Attending: Surgery | Admitting: Surgery

## 2014-04-26 ENCOUNTER — Ambulatory Visit (HOSPITAL_COMMUNITY)
Admission: RE | Admit: 2014-04-26 | Discharge: 2014-04-26 | Disposition: A | Discharge status: Home / Self Care | Payer: Medicare Other | Source: Ambulatory Visit | Attending: Surgery | Admitting: Surgery

## 2014-04-26 ENCOUNTER — Other Ambulatory Visit: Payer: Self-pay

## 2014-04-26 ENCOUNTER — Ambulatory Visit (INDEPENDENT_AMBULATORY_CARE_PROVIDER_SITE_OTHER): Payer: Medicare Other | Admitting: Surgery

## 2014-04-26 ENCOUNTER — Encounter: Payer: Self-pay | Admitting: Surgery

## 2014-04-26 VITALS — BP 127/70 | HR 57 | Ht 74.0 in | Wt 211.0 lb

## 2014-04-26 DIAGNOSIS — I70409 Unspecified atherosclerosis of autologous vein bypass graft(s) of the extremities, unspecified extremity: Secondary | ICD-10-CM

## 2014-04-26 DIAGNOSIS — Z48812 Encounter for surgical aftercare following surgery on the circulatory system: Secondary | ICD-10-CM | POA: Diagnosis not present

## 2014-04-26 DIAGNOSIS — I724 Aneurysm of artery of lower extremity: Secondary | ICD-10-CM | POA: Diagnosis not present

## 2014-04-26 DIAGNOSIS — I70302 Unspecified atherosclerosis of unspecified type of bypass graft(s) of the extremities, left leg: Secondary | ICD-10-CM

## 2014-04-26 NOTE — Progress Notes (Signed)
[facility] [place] HPI:  The patient comes in today for followup. He initially underwent endovascular abdominal aortic aneurysm repair in October of 2011. He has a known type II endoleak, however his aneurysm continues to decrease. He is also status post endovascular repair of a left popliteal artery aneurysm on October 2011.  He presented to the office on 10/21/2013 with a six-day history of pain and coldness in his left foot. He was found to have an occluded popliteal stent by angiography the following day. An attempt at from a lysis was performed, however the patient developed some cardiac issues as well as bleeding, and this had to be discontinued. The patient continued to have an ischemic foot and therefore he was taken to the operating room for revascularization. On 10/29/2013, he underwent femoral to posterior tibial artery bypass graft. He had an excellent caliber vein. The bypass graft occluded early and he was taken back. The bypass was safe to be re-opened. I explored the posterior tibial artery behind the ankle. I passed catheters all the way into the pedal arch and injected TPA. The patient was transitioned to oral anticoagulation and ultimately discharged. While he was in the hospital he did bump his toe and developed an area of erythema and black eschar on the fourth and fifth digits. He was treated with antibiotics.   He is here today for follow up ABI and graft duplex on the left LE.  His chief complaint is heel pain after walking for an extended time.   ROS: no fever chills, SOB or chest pain.  Otherwise negative.   Objective 127/70 57     98%   Left foot wounds are all healed as well as incisions. Min. Pitting edema in the left lower, leg ankle and foot Heart RRR Lungs non labored breathing  Left LE arterial duplex:   Patent by pass with increased velocity at the distal below knee by-pass suggestive of 70% stenosis. Distal graft 48/455 ratio  of 9.5 cm/s  ABI's: Left today 0.60 and 11/18/2013 0.39 which shows improvement  Assessment/Planning: Status post left popliteal artery aneurysm repair, endovascular Left LE PAD with resulting toe ischemia and amputation Dr. Myra Gianotti has suggested a follow up study in regards to the graft stenosis.  We will order a CT scan of the abdomin and pelvis with left LE to get a better picture of the area of stenosis.  This will also let us follow up on the EVAR status.  If there is 70% or greater stenosis he will need and angiogram with possible intervention.  Dr. Myra Gianotti discussed this possibility with him and his wife today in clinic.  We also gave him a prescription for 15-20 mm hg compression socks which will help with the edema.  He will F/U in 2-3 weeks after the CT.  Thomasena Edis, EMMA MAUREEN PA-C   Thomasena Edis, EMMA [facility] 04/26/2014 3:05 PM   I agree with the above.  The most immediate issue is a possible stenosis within his left leg bypass graft.  This is a significant finding compared to his study 6 months ago.  I proposed studying this with angiography, however the patient does not want to go through another procedure at this time.  In addition he is on Coumadin.  Therefore I think it is reasonable to get a CT angiogram to determine whether or not this stenosis is significant.  In addition by getting a CT Carlyon Prows of the leg I can also evaluate his endovascular  repair of his abdominal aortic aneurysm which has a known type II endoleak.  This will be down within the next several weeks and he'll follow up after that.  Wells Brabhanm

## 2014-04-27 ENCOUNTER — Telehealth: Payer: Self-pay | Admitting: Family

## 2014-04-27 ENCOUNTER — Other Ambulatory Visit: Payer: Self-pay | Admitting: *Deleted

## 2014-04-27 DIAGNOSIS — I749 Embolism and thrombosis of unspecified artery: Secondary | ICD-10-CM | POA: Diagnosis not present

## 2014-04-27 DIAGNOSIS — Z7901 Long term (current) use of anticoagulants: Secondary | ICD-10-CM | POA: Diagnosis not present

## 2014-04-27 DIAGNOSIS — T508X5D Adverse effect of diagnostic agents, subsequent encounter: Secondary | ICD-10-CM

## 2014-04-27 DIAGNOSIS — Z9889 Other specified postprocedural states: Secondary | ICD-10-CM | POA: Diagnosis not present

## 2014-04-27 MED ORDER — PREDNISONE 50 MG PO TABS: ORAL_TABLET | ORAL | Status: DC

## 2014-04-27 MED ORDER — DIPHENHYDRAMINE HCL 50 MG PO CAPS: ORAL_CAPSULE | ORAL | Status: DC

## 2014-04-27 NOTE — Telephone Encounter (Signed)
Spoke with pt - gave the following info: 3/23 - 3/24 [facility] [address] [phone] for labs 05/10/14 12:10pm [facility], then come to see Dr. Myra GianottiBrabham after. Pt verbalized understanding. Lab order [facility] [phone].

## 2014-05-05 DIAGNOSIS — E785 Hyperlipidemia, unspecified: Secondary | ICD-10-CM | POA: Diagnosis not present

## 2014-05-05 DIAGNOSIS — I2510 Atherosclerotic heart disease of native coronary artery without angina pectoris: Secondary | ICD-10-CM | POA: Diagnosis not present

## 2014-05-05 DIAGNOSIS — Z48812 Encounter for surgical aftercare following surgery on the circulatory system: Secondary | ICD-10-CM | POA: Diagnosis not present

## 2014-05-05 DIAGNOSIS — I724 Aneurysm of artery of lower extremity: Secondary | ICD-10-CM | POA: Diagnosis not present

## 2014-05-05 DIAGNOSIS — Z01812 Encounter for preprocedural laboratory examination: Secondary | ICD-10-CM | POA: Diagnosis not present

## 2014-05-06 ENCOUNTER — Encounter: Payer: Self-pay | Admitting: Surgery

## 2014-05-10 ENCOUNTER — Ambulatory Visit
Admission: RE | Admit: 2014-05-10 | Discharge: 2014-05-10 | Disposition: A | Discharge status: Home / Self Care | Payer: Medicare Other | Source: Ambulatory Visit | Attending: Surgery | Admitting: Surgery

## 2014-05-10 ENCOUNTER — Encounter: Payer: Self-pay | Admitting: Surgery

## 2014-05-10 ENCOUNTER — Ambulatory Visit (INDEPENDENT_AMBULATORY_CARE_PROVIDER_SITE_OTHER): Payer: Medicare Other | Admitting: Surgery

## 2014-05-10 ENCOUNTER — Other Ambulatory Visit: Payer: Self-pay

## 2014-05-10 VITALS — BP 127/80 | HR 79 | Ht 74.0 in | Wt 208.5 lb

## 2014-05-10 DIAGNOSIS — I743 Embolism and thrombosis of arteries of the lower extremities: Secondary | ICD-10-CM | POA: Diagnosis not present

## 2014-05-10 DIAGNOSIS — I70302 Unspecified atherosclerosis of unspecified type of bypass graft(s) of the extremities, left leg: Secondary | ICD-10-CM

## 2014-05-10 DIAGNOSIS — I724 Aneurysm of artery of lower extremity: Secondary | ICD-10-CM

## 2014-05-10 DIAGNOSIS — I714 Abdominal aortic aneurysm, without rupture: Secondary | ICD-10-CM | POA: Diagnosis not present

## 2014-05-10 DIAGNOSIS — K769 Liver disease, unspecified: Secondary | ICD-10-CM | POA: Diagnosis not present

## 2014-05-10 DIAGNOSIS — K5730 Diverticulosis of large intestine without perforation or abscess without bleeding: Secondary | ICD-10-CM | POA: Diagnosis not present

## 2014-05-10 MED ORDER — ENOXAPARIN SODIUM 150 MG/ML ~~LOC~~ SOLN: 1.0000 mg/kg | Freq: Two times a day (BID) | SUBCUTANEOUS | Status: DC

## 2014-05-10 MED ORDER — IOPAMIDOL (ISOVUE-370) INJECTION 76%
165.0000 mL | Freq: Once | INTRAVENOUS | Status: AC | PRN
  Administered 2014-05-10: 165 mL via INTRAVENOUS

## 2014-05-10 NOTE — Progress Notes (Signed)
   Patient name: [name] [name] MRN: [id number] DOB: 05/23/1945 Sex: male     Chief Complaint  Patient presents with  . Re-evaluation    2-3 wk f/u CTA abd/pelvis prior    HISTORY OF PRESENT ILLNESS: The patient comes in today for followup. He initially underwent endovascular abdominal aortic aneurysm repair in October of 2011. He has a known type II endoleak, however his aneurysm continues to decrease. He is also status post endovascular repair of a left popliteal artery aneurysm on October 2011.  He presented to the office on 10/21/2013 with a six-day history of pain and coldness in his left foot. He was found to have an occluded popliteal stent by angiography the following day. An attempt at from a lysis was performed, however the patient developed some cardiac issues as well as bleeding, and this had to be discontinued. The patient continued to have an ischemic foot and therefore he was taken to the operating room for revascularization. On 10/29/2013, he underwent femoral to posterior tibial artery bypass graft. He had an excellent caliber vein. The bypass graft occluded early and he was taken back. The bypass was safe to be re-opened. I explored the posterior tibial artery behind the ankle. I passed catheters all the way into the pedal arch and injected TPA. The patient was transitioned to oral anticoagulation and ultimately discharged. While he was in the hospital he did bump his toe and developed an area of erythema and black eschar on the fourth and fifth digits. He was treated with antibiotics.  His last duplex revealed elevated velocities at the distal anastomosis.  I ended up sending him for a CT scan as he is on Coumadin and did not want another procedure.  He is here today to discuss these results  Past Medical History  Diagnosis Date  . Abdominal aortic aneurysm   . Popliteal aneurysm   . Hyperlipidemia   . Hypertension   . Coronary artery disease   . Detached retina   .  Myocardial infarction     2007 heart stent  . Chronic kidney disease     Right kidney stone    Past Surgical History  Procedure Laterality Date  . Coronary stenting      2007  . Abdominal aortic aneurysm repair  11/24/09    EVAR  . Popliteal artery stent  11/2009    left popliteal artery   . Retinal detachment surgery Right 2011  . Tooth extraction  June 2015    Wisdom Tooth Extraction  . Femoral-popliteal bypass graft Left 10/29/2013    Procedure: BYPASS GRAFT FEMORAL-POPLITEAL ARTERY;  Surgeon: Vance W Brabham, MD;  Location: [facility] OR;  Service: Vascular;  Laterality: Left;  Left Femoral to Posterior Tibial artery bypass graft using nonreversed saphenous vein. and thrombectomy of Tibial arterly.  . Femoral-tibial bypass graft Left 10/30/2013    Procedure: Reexploration of Femoral-Posterior Tibial Bypass; Thrombectomy of Bypass; Left Leg Angiogram; Posterior Tibial Vein Patch Angioplasty;  Surgeon: Vance W Brabham, MD;  Location: [facility] OR;  Service: Vascular;  Laterality: Left;  . Eye surgery Right     cataract removed  . Colonoscopy    . Amputation Left 12/10/2013    Procedure: AMPUTATION LEFT  FOURTH AND FIFTH TOES;  Surgeon: Vance W Brabham, MD;  Location: [facility] OR;  Service: Vascular;  Laterality: Left;    History   Social History  . Marital Status: Married    Spouse Name: N/A  . Number of Children: N/A  .   Years of Education: N/A   Occupational History  . Retired    Social History Main Topics  . Smoking status: Former Smoker -- 15 years    Types: Cigarettes    Quit date: 09/21/1985  . Smokeless tobacco: Current User    Types: Chew  . Alcohol Use: No  . Drug Use: No  . Sexual Activity: Not on file   Other Topics Concern  . Not on file   Social History Narrative   Lives with family.    Family History  Problem Relation Age of Onset  . Aneurysm Sister     possibly   . Peripheral vascular disease Brother     Allergies as of 05/10/2014 - Review Complete 05/10/2014    Allergen Reaction Noted  . Omnipaque [iohexol] Rash 03/27/2010  . Oxytetracycline Rash 10/11/2009    Current Outpatient Prescriptions on File Prior to Visit  Medication Sig Dispense Refill  . aspirin 81 MG EC tablet Take 81 mg by mouth daily.      . atorvastatin (LIPITOR) 40 MG tablet TAKE ONE TABLET BY MOUTH ONCE DAILY*NEEDS OFFICE VISIT* 30 tablet 6  . diphenhydrAMINE (BENADRYL) 50 MG capsule Take 50 mg by mouth 1 hour prior to your procedure. 1 capsule 0  . levofloxacin (LEVAQUIN) 500 MG tablet Take 1 tablet (500 mg total) by mouth daily. 10 tablet 0  . metoprolol tartrate (LOPRESSOR) 25 MG tablet Take 25 mg by mouth 2 (two) times daily.     . Multiple Vitamin (MULTIVITAMIN WITH MINERALS) TABS tablet Take 1 tablet by mouth daily.    . Omega-3 Fatty Acids (FISH OIL TRIPLE STRENGTH) 1400 MG CAPS Take 2,800 mg by mouth 2 (two) times daily.    . oxyCODONE (OXY IR/ROXICODONE) 5 MG immediate release tablet Take 1-2 tablets (5-10 mg total) by mouth every 4 (four) hours as needed for moderate pain. 40 tablet 0  . oxyCODONE (OXY IR/ROXICODONE) 5 MG immediate release tablet Take 1 tablet (5 mg total) by mouth every 6 (six) hours as needed for severe pain. 30 tablet 0  . predniSONE (DELTASONE) 50 MG tablet One tablet (50mg) 13 hours prior to procedure; one tablet (50mg) 7 hours prior to procedure and then one tablet (50 mg) one hour prior to procedure. 3 tablet 0  . Saw Palmetto 450 MG CAPS Take 900 mg by mouth 2 (two) times daily.    . warfarin (COUMADIN) 10 MG tablet Take 1 tablet (10 mg total) by mouth daily. (Patient taking differently: Take 7.5 mg by mouth daily. ) 30 tablet 2   No current facility-administered medications on file prior to visit.     REVIEW OF SYSTEMS: No changes from prior visit   PHYSICAL EXAMINATION:   Vital signs are  Filed Vitals:   05/10/14 1327 05/10/14 1329  BP: 131/81 127/80  Pulse: 79   Height: 6' 2" (1.88 m)   Weight: 208 lb 8 oz (94.575 kg)   SpO2: 98%     Body mass index is 26.76 kg/(m^2). General: The patient appears their stated age. HEENT:  No gross abnormalities Pulmonary:  Non labored breathing Musculoskeletal: There are no major deformities. Neurologic: No focal weakness or paresthesias are detected, Skin: There are no ulcer or rashes noted. Psychiatric: The patient has normal affect.   Diagnostic Studies I have reviewed his CT scan which has not formally been read by radiology.  I did not see a type II endoleak on his aneurysm.  This did confirm the high-grade stenosis within the   left popliteal artery, likely at the distal anastomosis.  Assessment: Left leg bypass graft stenosis Plan: We discussed the CT scan findings today.  They correlate with the ultrasound findings which are consistent with a high-grade stenosis at the distal anastomosis.  I feel like this needs to be treated in order to preserve his bypass graft.  This will need to be done from a antegrade approach.  I am a little reluctant to stop his Coumadin given the degree of stenosis, therefore since this procedure has been scheduled for Tuesday, April 12, I will stop his Coumadin 5 days prior to his procedure with plans on giving him Lovenox beginning 2 days prior to the procedure and in the days following the procedure.  V. Wells Brabham IV, M.D. [facility] [place] Office: [phone] Pager:  [phone]   

## 2014-05-10 NOTE — Addendum Note (Signed)
Addended by: Sharee PimpleMCCHESNEY, Sabastien Tyler K on: 05/10/2014 02:42 PM   Modules accepted: Orders

## 2014-05-24 MED ORDER — SODIUM CHLORIDE 0.9 % IV SOLN
INTRAVENOUS | Status: DC
  Administered 2014-05-25: 1000 mL via INTRAVENOUS

## 2014-05-25 ENCOUNTER — Other Ambulatory Visit: Payer: Self-pay

## 2014-05-25 ENCOUNTER — Encounter (HOSPITAL_COMMUNITY)
Admission: RE | Disposition: A | Discharge status: Home / Self Care | Payer: Self-pay | Source: Ambulatory Visit | Attending: Surgery

## 2014-05-25 ENCOUNTER — Encounter (HOSPITAL_COMMUNITY): Payer: Self-pay | Admitting: Surgery

## 2014-05-25 ENCOUNTER — Ambulatory Visit (HOSPITAL_COMMUNITY)
Admission: RE | Admit: 2014-05-25 | Discharge: 2014-05-25 | Disposition: A | Discharge status: Home / Self Care | Payer: Medicare Other | Source: Ambulatory Visit | Attending: Surgery | Admitting: Surgery

## 2014-05-25 DIAGNOSIS — Z955 Presence of coronary angioplasty implant and graft: Secondary | ICD-10-CM | POA: Diagnosis not present

## 2014-05-25 DIAGNOSIS — Z7952 Long term (current) use of systemic steroids: Secondary | ICD-10-CM | POA: Diagnosis not present

## 2014-05-25 DIAGNOSIS — I2510 Atherosclerotic heart disease of native coronary artery without angina pectoris: Secondary | ICD-10-CM | POA: Diagnosis not present

## 2014-05-25 DIAGNOSIS — Z79899 Other long term (current) drug therapy: Secondary | ICD-10-CM | POA: Insufficient documentation

## 2014-05-25 DIAGNOSIS — I70402 Unspecified atherosclerosis of autologous vein bypass graft(s) of the extremities, left leg: Secondary | ICD-10-CM | POA: Diagnosis not present

## 2014-05-25 DIAGNOSIS — Z87891 Personal history of nicotine dependence: Secondary | ICD-10-CM | POA: Insufficient documentation

## 2014-05-25 DIAGNOSIS — T82858A Stenosis of vascular prosthetic devices, implants and grafts, initial encounter: Secondary | ICD-10-CM

## 2014-05-25 DIAGNOSIS — Z792 Long term (current) use of antibiotics: Secondary | ICD-10-CM | POA: Diagnosis not present

## 2014-05-25 DIAGNOSIS — E785 Hyperlipidemia, unspecified: Secondary | ICD-10-CM | POA: Diagnosis not present

## 2014-05-25 DIAGNOSIS — Z7901 Long term (current) use of anticoagulants: Secondary | ICD-10-CM | POA: Diagnosis not present

## 2014-05-25 DIAGNOSIS — Z95820 Peripheral vascular angioplasty status with implants and grafts: Secondary | ICD-10-CM | POA: Insufficient documentation

## 2014-05-25 DIAGNOSIS — I129 Hypertensive chronic kidney disease with stage 1 through stage 4 chronic kidney disease, or unspecified chronic kidney disease: Secondary | ICD-10-CM | POA: Insufficient documentation

## 2014-05-25 DIAGNOSIS — N189 Chronic kidney disease, unspecified: Secondary | ICD-10-CM | POA: Insufficient documentation

## 2014-05-25 DIAGNOSIS — I70302 Unspecified atherosclerosis of unspecified type of bypass graft(s) of the extremities, left leg: Secondary | ICD-10-CM | POA: Insufficient documentation

## 2014-05-25 DIAGNOSIS — Z79891 Long term (current) use of opiate analgesic: Secondary | ICD-10-CM | POA: Diagnosis not present

## 2014-05-25 DIAGNOSIS — Z7982 Long term (current) use of aspirin: Secondary | ICD-10-CM | POA: Insufficient documentation

## 2014-05-25 DIAGNOSIS — I252 Old myocardial infarction: Secondary | ICD-10-CM | POA: Insufficient documentation

## 2014-05-25 DIAGNOSIS — Z48812 Encounter for surgical aftercare following surgery on the circulatory system: Secondary | ICD-10-CM

## 2014-05-25 HISTORY — PX: LOWER EXTREMITY ANGIOGRAM: SHX5508

## 2014-05-25 LAB — POCT I-STAT, CHEM 8
BUN: 15 mg/dL (ref 6–23)
CALCIUM ION: 1.35 mmol/L — AB (ref 1.13–1.30)
CHLORIDE: 105 mmol/L (ref 96–112)
Creatinine, Ser: 1.3 mg/dL (ref 0.50–1.35)
Glucose, Bld: 106 mg/dL — ABNORMAL HIGH (ref 70–99)
HCT: 48 % (ref 39.0–52.0)
HEMOGLOBIN: 16.3 g/dL (ref 13.0–17.0)
Potassium: 4.5 mmol/L (ref 3.5–5.1)
SODIUM: 144 mmol/L (ref 135–145)
TCO2: 23 mmol/L (ref 0–100)

## 2014-05-25 LAB — POCT ACTIVATED CLOTTING TIME
ACTIVATED CLOTTING TIME: 159 s
Activated Clotting Time: 214 seconds
Activated Clotting Time: 183 seconds

## 2014-05-25 LAB — PROTIME-INR
INR: 1.1 (ref 0.00–1.49)
PROTHROMBIN TIME: 14.3 s (ref 11.6–15.2)

## 2014-05-25 SURGERY — ANGIOGRAM, LOWER EXTREMITY

## 2014-05-25 MED ORDER — OXYCODONE HCL 5 MG PO TABS: 5.0000 mg | ORAL_TABLET | ORAL | Status: DC | PRN

## 2014-05-25 MED ORDER — LIDOCAINE HCL (PF) 1 % IJ SOLN
INTRAMUSCULAR | Status: AC
  Filled 2014-05-25: qty 30

## 2014-05-25 MED ORDER — METHYLPREDNISOLONE SODIUM SUCC 125 MG IJ SOLR
INTRAMUSCULAR | Status: AC
  Administered 2014-05-25: 125 mg via INTRAVENOUS
  Filled 2014-05-25: qty 2

## 2014-05-25 MED ORDER — DIPHENHYDRAMINE HCL 50 MG/ML IJ SOLN
25.0000 mg | INTRAMUSCULAR | Status: AC
  Administered 2014-05-25: 25 mg via INTRAVENOUS

## 2014-05-25 MED ORDER — METHYLPREDNISOLONE SODIUM SUCC 125 MG IJ SOLR
125.0000 mg | INTRAMUSCULAR | Status: AC
  Administered 2014-05-25: 125 mg via INTRAVENOUS

## 2014-05-25 MED ORDER — PHENOL 1.4 % MT LIQD: 1.0000 | OROMUCOSAL | Status: DC | PRN

## 2014-05-25 MED ORDER — FAMOTIDINE IN NACL 20-0.9 MG/50ML-% IV SOLN
INTRAVENOUS | Status: AC
  Administered 2014-05-25: 20 mg via INTRAVENOUS
  Filled 2014-05-25: qty 50

## 2014-05-25 MED ORDER — ONDANSETRON HCL 4 MG/2ML IJ SOLN: 4.0000 mg | Freq: Four times a day (QID) | INTRAMUSCULAR | Status: DC | PRN

## 2014-05-25 MED ORDER — FENTANYL CITRATE 0.05 MG/ML IJ SOLN
INTRAMUSCULAR | Status: AC
  Filled 2014-05-25: qty 2

## 2014-05-25 MED ORDER — ACETAMINOPHEN 325 MG RE SUPP: 325.0000 mg | RECTAL | Status: DC | PRN

## 2014-05-25 MED ORDER — METOPROLOL TARTRATE 1 MG/ML IV SOLN: 2.0000 mg | INTRAVENOUS | Status: DC | PRN

## 2014-05-25 MED ORDER — ALUM & MAG HYDROXIDE-SIMETH 200-200-20 MG/5ML PO SUSP: 15.0000 mL | ORAL | Status: DC | PRN

## 2014-05-25 MED ORDER — MORPHINE SULFATE 10 MG/ML IJ SOLN: 2.0000 mg | INTRAMUSCULAR | Status: DC | PRN

## 2014-05-25 MED ORDER — LABETALOL HCL 5 MG/ML IV SOLN: 10.0000 mg | INTRAVENOUS | Status: DC | PRN

## 2014-05-25 MED ORDER — HYDRALAZINE HCL 20 MG/ML IJ SOLN: 5.0000 mg | INTRAMUSCULAR | Status: DC | PRN

## 2014-05-25 MED ORDER — MIDAZOLAM HCL 2 MG/2ML IJ SOLN
INTRAMUSCULAR | Status: AC
  Filled 2014-05-25: qty 2

## 2014-05-25 MED ORDER — HEPARIN (PORCINE) IN NACL 2-0.9 UNIT/ML-% IJ SOLN
INTRAMUSCULAR | Status: AC
  Filled 2014-05-25: qty 1000

## 2014-05-25 MED ORDER — DOCUSATE SODIUM 100 MG PO CAPS: 100.0000 mg | ORAL_CAPSULE | Freq: Every day | ORAL | Status: DC

## 2014-05-25 MED ORDER — FAMOTIDINE IN NACL 20-0.9 MG/50ML-% IV SOLN
20.0000 mg | INTRAVENOUS | Status: AC
  Administered 2014-05-25: 20 mg via INTRAVENOUS

## 2014-05-25 MED ORDER — ACETAMINOPHEN 325 MG PO TABS: 325.0000 mg | ORAL_TABLET | ORAL | Status: DC | PRN

## 2014-05-25 MED ORDER — GUAIFENESIN-DM 100-10 MG/5ML PO SYRP: 15.0000 mL | ORAL_SOLUTION | ORAL | Status: DC | PRN

## 2014-05-25 MED ORDER — SODIUM CHLORIDE 0.9 % IV SOLN: 1.0000 mL/kg/h | INTRAVENOUS | Status: DC

## 2014-05-25 MED ORDER — DIPHENHYDRAMINE HCL 50 MG/ML IJ SOLN
INTRAMUSCULAR | Status: AC
  Filled 2014-05-25: qty 1

## 2014-05-25 NOTE — H&P (View-Only) (Signed)
Patient name: [name] [name] MRN: [id number] DOB: 02/27/1945 Sex: male     Chief Complaint  Patient presents with  . Re-evaluation    2-3 wk f/u CTA abd/pelvis prior    HISTORY OF PRESENT ILLNESS: The patient comes in today for followup. He initially underwent endovascular abdominal aortic aneurysm repair in October of 2011. He has a known type II endoleak, however his aneurysm continues to decrease. He is also status post endovascular repair of a left popliteal artery aneurysm on October 2011.  He presented to the office on 10/21/2013 with a six-day history of pain and coldness in his left foot. He was found to have an occluded popliteal stent by angiography the following day. An attempt at from a lysis was performed, however the patient developed some cardiac issues as well as bleeding, and this had to be discontinued. The patient continued to have an ischemic foot and therefore he was taken to the operating room for revascularization. On 10/29/2013, he underwent femoral to posterior tibial artery bypass graft. He had an excellent caliber vein. The bypass graft occluded early and he was taken back. The bypass was safe to be re-opened. I explored the posterior tibial artery behind the ankle. I passed catheters all the way into the pedal arch and injected TPA. The patient was transitioned to oral anticoagulation and ultimately discharged. While he was in the hospital he did bump his toe and developed an area of erythema and black eschar on the fourth and fifth digits. He was treated with antibiotics.  His last duplex revealed elevated velocities at the distal anastomosis.  I ended up sending him for a CT scan as he is on Coumadin and did not want another procedure.  He is here today to discuss these results  Past Medical History  Diagnosis Date  . Abdominal aortic aneurysm   . Popliteal aneurysm   . Hyperlipidemia   . Hypertension   . Coronary artery disease   . Detached retina   .  Myocardial infarction     2007 heart stent  . Chronic kidney disease     Right kidney stone    Past Surgical History  Procedure Laterality Date  . Coronary stenting      2007  . Abdominal aortic aneurysm repair  11/24/09    EVAR  . Popliteal artery stent  11/2009    left popliteal artery   . Retinal detachment surgery Right 2011  . Tooth extraction  June 2015    Wisdom Tooth Extraction  . Femoral-popliteal bypass graft Left 10/29/2013    Procedure: BYPASS GRAFT FEMORAL-POPLITEAL ARTERY;  Surgeon: Nada LibmanVance W Brabham, MD;  Location: [facility] OR;  Service: Vascular;  Laterality: Left;  Left Femoral to Posterior Tibial artery bypass graft using nonreversed saphenous vein. and thrombectomy of Tibial arterly.  . Femoral-tibial bypass graft Left 10/30/2013    Procedure: Reexploration of Femoral-Posterior Tibial Bypass; Thrombectomy of Bypass; Left Leg Angiogram; Posterior Tibial Vein Patch Angioplasty;  Surgeon: Nada LibmanVance W Brabham, MD;  [facility];  Service: Vascular;  Laterality: Left;  Marland Kitchen. Eye surgery Right     cataract removed  . Colonoscopy    . Amputation Left 12/10/2013    Procedure: AMPUTATION LEFT  FOURTH AND FIFTH TOES;  Surgeon: Nada LibmanVance W Brabham, MD;  [facility];  Service: Vascular;  Laterality: Left;    History   Social History  . Marital Status: Married    Spouse Name: N/A  . Number of Children: N/A  .  Years of Education: N/A   Occupational History  . Retired    Social History Main Topics  . Smoking status: Former Smoker -- 15 years    Types: Cigarettes    Quit date: 09/21/1985  . Smokeless tobacco: Current User    Types: Chew  . Alcohol Use: No  . Drug Use: No  . Sexual Activity: Not on file   Other Topics Concern  . Not on file   Social History Narrative   Lives with family.    Family History  Problem Relation Age of Onset  . Aneurysm Sister     possibly   . Peripheral vascular disease Brother     Allergies as of 05/10/2014 - Review Complete 05/10/2014    Allergen Reaction Noted  . Omnipaque [iohexol] Rash 03/27/2010  . Oxytetracycline Rash 10/11/2009    Current Outpatient Prescriptions on File Prior to Visit  Medication Sig Dispense Refill  . aspirin 81 MG EC tablet Take 81 mg by mouth daily.      Marland Kitchen atorvastatin (LIPITOR) 40 MG tablet TAKE ONE TABLET BY MOUTH ONCE DAILY*NEEDS OFFICE VISIT* 30 tablet 6  . diphenhydrAMINE (BENADRYL) 50 MG capsule Take 50 mg by mouth 1 hour prior to your procedure. 1 capsule 0  . levofloxacin (LEVAQUIN) 500 MG tablet Take 1 tablet (500 mg total) by mouth daily. 10 tablet 0  . metoprolol tartrate (LOPRESSOR) 25 MG tablet Take 25 mg by mouth 2 (two) times daily.     . Multiple Vitamin (MULTIVITAMIN WITH MINERALS) TABS tablet Take 1 tablet by mouth daily.    . Omega-3 Fatty Acids (FISH OIL TRIPLE STRENGTH) 1400 MG CAPS Take 2,800 mg by mouth 2 (two) times daily.    Marland Kitchen oxyCODONE (OXY IR/ROXICODONE) 5 MG immediate release tablet Take 1-2 tablets (5-10 mg total) by mouth every 4 (four) hours as needed for moderate pain. 40 tablet 0  . oxyCODONE (OXY IR/ROXICODONE) 5 MG immediate release tablet Take 1 tablet (5 mg total) by mouth every 6 (six) hours as needed for severe pain. 30 tablet 0  . predniSONE (DELTASONE) 50 MG tablet One tablet ( ) 13 hours prior to procedure; one tablet ( ) 7 hours prior to procedure and then one tablet (50 mg) one hour prior to procedure. 3 tablet 0  . Saw Palmetto 450 MG CAPS Take 900 mg by mouth 2 (two) times daily.    Marland Kitchen warfarin (COUMADIN) 10 MG tablet Take 1 tablet (10 mg total) by mouth daily. (Patient taking differently: Take 7.5 mg by mouth daily. ) 30 tablet 2   No current facility-administered medications on file prior to visit.     REVIEW OF SYSTEMS: No changes from prior visit   PHYSICAL EXAMINATION:   Vital signs are  Filed Vitals:   05/10/14 1327 05/10/14 1329  BP: 131/81 127/80  Pulse: 79   Height:  (1.88 m)   Weight: 208 lb 8 oz (94.575 kg)   SpO2: 98%     Body mass index is 26.76 kg/(m^2). General: The patient appears their stated age. HEENT:  No gross abnormalities Pulmonary:  Non labored breathing Musculoskeletal: There are no major deformities. Neurologic: No focal weakness or paresthesias are detected, Skin: There are no ulcer or rashes noted. Psychiatric: The patient has normal affect.   Diagnostic Studies I have reviewed his CT scan which has not formally been read by radiology.  I did not see a type II endoleak on his aneurysm.  This did confirm the high-grade stenosis within the  left popliteal artery, likely at the distal anastomosis.  Assessment: Left leg bypass graft stenosis Plan: We discussed the CT scan findings today.  They correlate with the ultrasound findings which are consistent with a high-grade stenosis at the distal anastomosis.  I feel like this needs to be treated in order to preserve his bypass graft.  This will need to be done from a antegrade approach.  I am a little reluctant to stop his Coumadin given the degree of stenosis, therefore since this procedure has been scheduled for Tuesday, April 12, I will stop his Coumadin 5 days prior to his procedure with plans on giving him Lovenox beginning 2 days prior to the procedure and in the days following the procedure.  Jorge NyV. Wells Brabham IV, M.D. [facility] [place] Office: [phone] Pager:  [phone]

## 2014-05-25 NOTE — Op Note (Signed)
Patient name: [name] [name] MRN: [id number] DOB: [birth year] Sex: male  05/25/2014 Pre-operative Diagnosis: Bypass graft stenosis, left leg Post-operative diagnosis:  Same Surgeon:  Eldridge Abrahams Procedure Performed:  1.  Ultrasound-guided access, left common femoral artery (antegrade)  2.  Left lower extremity runoff  3.  Drug coated balloon angioplasty, left femoral to posterior tibial artery bypass graft  Findings: 95% stenosis within the distal portion of the femoral to posterior tibial artery vein bypass graft.  The stenosis was successfully treated to a residual stenosis less than 10% by using a Angiosculpt 3 x 20 balloon, followed by a drug coated Lutonix 4 x 40 balloon  Indications:  The patient has a history of stenting of a left popliteal aneurysm.  This occluded and he ultimately underwent femoral to posterior tibial artery bypass graft with vein.  He is found to have a stenosis with that his vein, confirmed by CT angiogram.  He is here today for treatment.  Procedure:  The patient was identified in the holding area and taken to room 8.  The patient was then placed supine on the table and prepped and draped in the usual sterile fashion.  A time out was called.  Ultrasound was used to evaluate the left common femoral artery.  It was patent .  A digital ultrasound image was acquired.  A micropuncture needle was used to access the left common femoral artery under ultrasound guidance from an antegrade approach.  An 018 wire was advanced without resistance and a micropuncture sheath was placed.  Injections were performed which revealed that the sheath was in the native superficial femoral artery.  Over a Amplatz Super Stiff wire, a bright tip short 6 French sheath was placed.  I then with the use of a Kumpe catheter and a Benson wire, followed by a IM catheter and Bentson wire was able to get wire access into the bypass graft.  I then inserted the sheath over the Amplatz wire into  the native bypass graft.  This was a long 645 sheath.  The patient was then fully heparinized and images of the left leg were performed.  Findings:   Aortogram:  Not evaluated  Right Lower Extremity:  Not evaluated  Left Lower Extremity:  Left common femoral and profunda femoral artery are widely patent.  The superficial femoral artery is patent down to the visualized stent were then occludes.  There is a bypass graft originating from the left common femoral artery.  The distal anastomosis is at the posterior tibial artery.  Proximally 4 cm proximal to the bypass distal anastomosis there is a 95% stenosis within the vein.  No definitive runoff vessels are identified as the majority of the outflow the bypass is via collaterals.  Intervention:  I then passed a 014 [place] core wire across the stenosis.  A 3 x 20 Angiosculpt balloon was then advanced across the stenosis.  Balloon and plasty was performed taking the balloon to 14 atm and held for 2 minutes.  Follow-up imaging revealed improved results across the stenosis.  I then inserted a 4 x 40 drug coated Lutonix balloon across the lesion.  This was taken to nominal pressure and held for 3 minutes.  Completion imaging revealed resolution of the stenosis.  Wires were then removed and the patient taken the holding area for sheath pull.  Impression:  #1  successful drug coated balloon angioplasty using a 4 mm Lutonix balloon within the vein graft from the common femoral  to the posterior tibial artery  #2  severe outflow disease    V. Annamarie Major, M.D. [facility] [place] Office: [phone] Pager:  [phone]

## 2014-05-25 NOTE — Progress Notes (Signed)
Site area: Left FA Site Prior to Removal:  Level 0 Pressure Applied For:3825min Manual:   yes Patient Status During Pull:  stable Post Pull Site:  Level 0 Post Pull Instructions Given:  yes Post Pull Pulses Present: doppler Dressing Applied:  clear Bedrest begins @ 1625 Comments: minimal pressure used throughout

## 2014-05-25 NOTE — Discharge Instructions (Signed)

## 2014-05-25 NOTE — Interval H&P Note (Signed)
History and Physical Interval Note:  05/25/2014 1:28 PM  [name] [name]  has presented today for surgery, with the diagnosis of left lower bypass graft stenosis  The various methods of treatment have been discussed with the patient and family. After consideration of risks, benefits and other options for treatment, the patient has consented to  Procedure(s): LOWER EXTREMITY ANGIOGRAM (N/A) as a surgical intervention .  The patient's history has been reviewed, patient examined, no change in status, stable for surgery.  I have reviewed the patient's chart and labs.  Questions were answered to the patient's satisfaction.     Zali Kamaka IV, V. WELLS

## 2014-05-26 ENCOUNTER — Telehealth: Payer: Self-pay | Admitting: Surgery

## 2014-05-26 NOTE — Telephone Encounter (Signed)
-----   Message from Phillips Odorarol S Pullins, RN sent at 05/25/2014  5:20 PM EDT ----- Regarding: [name] [name] log;  also needs 3 mo. f/u with arterial duplex left leg and ABI's, and appt with [facility]   ----- Message -----    From: Nada LibmanVance W Brabham, MD    Sent: 05/25/2014   2:41 PM      To: [facility] Charge Pool  05/25/2014:  Surgeon:  Jorge NyBRABHAM IV, V. WELLS Procedure Performed:  1.  Ultrasound-guided access, left common femoral artery (antegrade)  2.  Left lower extremity runoff  3.  Drug coated balloon angioplasty, left femoral to posterior tibial artery bypass graft   Follow-up 3 months with me with a duplex of the left leg and ankle brachial indices

## 2014-05-28 DIAGNOSIS — Z9889 Other specified postprocedural states: Secondary | ICD-10-CM | POA: Diagnosis not present

## 2014-05-28 DIAGNOSIS — I749 Embolism and thrombosis of unspecified artery: Secondary | ICD-10-CM | POA: Diagnosis not present

## 2014-05-31 DIAGNOSIS — I749 Embolism and thrombosis of unspecified artery: Secondary | ICD-10-CM | POA: Diagnosis not present

## 2014-05-31 DIAGNOSIS — Z9889 Other specified postprocedural states: Secondary | ICD-10-CM | POA: Diagnosis not present

## 2014-06-07 DIAGNOSIS — Z9889 Other specified postprocedural states: Secondary | ICD-10-CM | POA: Diagnosis not present

## 2014-06-07 DIAGNOSIS — I749 Embolism and thrombosis of unspecified artery: Secondary | ICD-10-CM | POA: Diagnosis not present

## 2014-06-14 DIAGNOSIS — I749 Embolism and thrombosis of unspecified artery: Secondary | ICD-10-CM | POA: Diagnosis not present

## 2014-06-14 DIAGNOSIS — Z9889 Other specified postprocedural states: Secondary | ICD-10-CM | POA: Diagnosis not present

## 2014-06-22 ENCOUNTER — Ambulatory Visit (INDEPENDENT_AMBULATORY_CARE_PROVIDER_SITE_OTHER): Payer: Medicare Other | Admitting: Cardiovascular Disease

## 2014-06-22 ENCOUNTER — Encounter: Payer: Self-pay | Admitting: Cardiovascular Disease

## 2014-06-22 VITALS — BP 118/60 | HR 59 | Ht 74.0 in | Wt 210.0 lb

## 2014-06-22 DIAGNOSIS — E785 Hyperlipidemia, unspecified: Secondary | ICD-10-CM | POA: Diagnosis not present

## 2014-06-22 DIAGNOSIS — I10 Essential (primary) hypertension: Secondary | ICD-10-CM

## 2014-06-22 DIAGNOSIS — I2510 Atherosclerotic heart disease of native coronary artery without angina pectoris: Secondary | ICD-10-CM | POA: Diagnosis not present

## 2014-06-22 NOTE — Patient Instructions (Signed)
Medication Instructions:  Your physician recommends that you continue on your current medications as directed. Please refer to the Current Medication list given to you today.  Labwork: None  Testing/Procedures: None  Follow-Up: Your physician wants you to follow-up in: 1 YEAR with Dr Cooper.  You will receive a reminder letter in the mail two months in advance. If you don't receive a letter, please call our office to schedule the follow-up appointment.  Any Other Special Instructions Will Be Listed Below (If Applicable).   

## 2014-06-22 NOTE — Progress Notes (Signed)
Cardiology Office Note   Date:  06/23/2014   ID:  [name] Heritagehomas E Massman, DOB 04/29/1945, MRN [id number]  PCP:  Tamsen Roershrismon, DENNIS E, PA  Cardiologist:  Tonny Bollmanooper, Krishauna Schatzman, MD    Chief Complaint  Patient presents with  . Coronary Artery Disease     History of Present Illness: [name] [name] is a 69 y.o. male who presents for follow-up of CAD. The patient had an inferior wall MI approximately 8 years ago. He was treated with bare-metal stenting of the right coronary artery in 2007. He also has significant vascular disease and has undergone endovascular treatment of an abdominal aortic aneurysm and left popliteal artery aneurysm. Last year he developed a cold leg related to thrombosis of his popliteal endograft. He was initially treated with lysis, and then required surgery. He's had a long course, but now feels like he is recovering fairly well.  From a cardiac perspective, he is doing fine. He denies chest pain, shortness of breath, heart palpitations, lightheadedness, or syncope. He reports no changes in his medical program. He's treated with warfarin for his vascular disease.  Lipids are checked at least once yearly by his PCP, Dr Shella Spearinghrismon. He denies cardiac symptoms  Past Medical History  Diagnosis Date  . Abdominal aortic aneurysm   . Popliteal aneurysm   . Hyperlipidemia   . Hypertension   . Coronary artery disease   . Detached retina   . Myocardial infarction     2007 heart stent  . Chronic kidney disease     Right kidney stone    Past Surgical History  Procedure Laterality Date  . Coronary stenting      2007  . Abdominal aortic aneurysm repair  11/24/09    EVAR  . Popliteal artery stent  11/2009    left popliteal artery   . Retinal detachment surgery Right 2011  . Tooth extraction  June 2015    Wisdom Tooth Extraction  . Femoral-popliteal bypass graft Left 10/29/2013    Procedure: BYPASS GRAFT FEMORAL-POPLITEAL ARTERY;  Surgeon: Nada LibmanVance W Brabham, MD;  Location: [facility] OR;   Service: Vascular;  Laterality: Left;  Left Femoral to Posterior Tibial artery bypass graft using nonreversed saphenous vein. and thrombectomy of Tibial arterly.  . Femoral-tibial bypass graft Left 10/30/2013    Procedure: Reexploration of Femoral-Posterior Tibial Bypass; Thrombectomy of Bypass; Left Leg Angiogram; Posterior Tibial Vein Patch Angioplasty;  Surgeon: Nada LibmanVance W Brabham, MD;  [facility];  Service: Vascular;  Laterality: Left;  Marland Kitchen. Eye surgery Right     cataract removed  . Colonoscopy    . Amputation Left 12/10/2013    Procedure: AMPUTATION LEFT  FOURTH AND FIFTH TOES;  Surgeon: Nada LibmanVance W Brabham, MD;  [facility];  Service: Vascular;  Laterality: Left;  . Lower extremity angiogram N/A 05/25/2014    Procedure: LOWER EXTREMITY ANGIOGRAM;  Surgeon: Nada LibmanVance W Brabham, MD;  [facility];  Service: Cardiovascular;  Laterality: N/A;    Current Outpatient Prescriptions  Medication Sig Dispense Refill  . aspirin 81 MG EC tablet Take 81 mg by mouth daily.      Marland Kitchen. atorvastatin (LIPITOR) 40 MG tablet TAKE ONE TABLET BY MOUTH ONCE DAILY*NEEDS OFFICE VISIT* 30 tablet 6  . metoprolol tartrate (LOPRESSOR) 25 MG tablet Take 25 mg by mouth 2 (two) times daily.     . Multiple Vitamin (MULTIVITAMIN WITH MINERALS) TABS tablet Take 1 tablet by mouth daily.    . Omega-3 Fatty Acids (FISH OIL TRIPLE STRENGTH) 1400 MG CAPS  Take 2,800 mg by mouth 2 (two) times daily.    . Saw Palmetto 450 MG CAPS Take 900 mg by mouth 2 (two) times daily.    Marland Kitchen warfarin (COUMADIN) 10 MG tablet Take 1 tablet (10 mg total) by mouth daily. (Patient taking differently: Take 7.5 mg by mouth daily. ) 30 tablet 2   No current facility-administered medications for this visit.    Allergies:   Omnipaque; Oxytetracycline; and Alteplase   Social History:  The patient  reports that he quit smoking about 28 years ago. His smoking use included Cigarettes. He quit after 15 years of use. His smokeless tobacco use includes Chew.  He reports that he does not drink alcohol or use illicit drugs.   Family History:  The patient's  family history includes Aneurysm in his sister; Peripheral vascular disease in his brother.    ROS:  Please see the history of present illness.  Otherwise, review of systems is positive for leg pain and swelling.  All other systems are reviewed and negative.    PHYSICAL EXAM: VS:  BP 118/60 mmHg  Pulse 59  Ht  (1.88 m)  Wt 210 lb (95.255 kg)  BMI 26.95 kg/m2 , BMI Body mass index is 26.95 kg/(m^2). GEN: Well nourished, well developed, in no acute distress HEENT: normal Neck: no JVD, no masses. No carotid bruits Cardiac: RRR without murmur or gallop                Respiratory:  clear to auscultation bilaterally, normal work of breathing GI: soft, nontender, nondistended, + BS MS: no deformity or atrophy Ext: 1+ pretibial edema on the left Skin: warm and dry, no rash Neuro:  Strength and sensation are intact Psych: euthymic mood, full affect  EKG:  EKG is ordered today. The ekg ordered today shows nsr with age-indeterminate inferior MI  Recent Labs: 10/26/2013: TSH 4.920* 12/10/2013: ALT 21; Platelets 248 05/25/2014: BUN 15; Creatinine 1.30; Hemoglobin 16.3; Potassium 4.5; Sodium 144   Lipid Panel  No results found for: CHOL, TRIG, HDL, CHOLHDL, VLDL, LDLCALC, LDLDIRECT    Wt Readings from Last 3 Encounters:  06/22/14 210 lb (95.255 kg)  05/25/14 200 lb (90.719 kg)  05/10/14 208 lb 8 oz (94.575 kg)     Cardiac Studies Reviewed: 2D echocardiogram 10/26/2013: Study Conclusions  - Left ventricle: The cavity size was normal. Wall thickness was increased in a pattern of moderate LVH. Systolic function was normal. The estimated ejection fraction was in the range of 55% to 60%. Wall motion was normal; there were no regional wall motion abnormalities. Doppler parameters are consistent with abnormal left ventricular relaxation (grade 1 diastolic dysfunction). The  E/e&' ratio is between 8-15, suggesting indeterminate LV filling pressure. - Left atrium: The atrium was normal in size.  Impressions:  - LVEF 55-60%, normal wall motion, moderate LVH, normal LA size, diastolic dysfunction, indeterminate LV filling pressure.  ASSESSMENT AND PLAN: 1.  CAD, native vessel, with old MI: The patient has no anginal symptoms. He is tolerating his medical therapy without problems. Medications were reviewed and no changes were recommended today.  2. Hyperlipidemia: Lipids followed by PCP. The patient is on a statin drug.  3. Peripheral arterial disease: The patient appears to be recovering well after revascularization surgery on the left leg. He is now maintained on warfarin and low-dose ASA. He follows with Dr. Myra Gianotti.  4. HTN: controlled on current Rx  Current medicines are reviewed with the patient today.  The patient does not  have concerns regarding medicines.  Labs/ tests ordered today include:   Orders Placed This Encounter  Procedures  . EKG 12-Lead    Disposition:   FU one year  Signed, Tonny Bollmanooper, Jaston Havens, MD  06/23/2014 10:18 PM    [facility] [address], [place], [place]  [address] Phone: [phone]; Fax: [phone]

## 2014-07-14 ENCOUNTER — Other Ambulatory Visit: Payer: Self-pay | Admitting: Family Medicine

## 2014-07-14 DIAGNOSIS — Z7901 Long term (current) use of anticoagulants: Secondary | ICD-10-CM | POA: Diagnosis not present

## 2014-07-14 DIAGNOSIS — I749 Embolism and thrombosis of unspecified artery: Secondary | ICD-10-CM | POA: Diagnosis not present

## 2014-07-15 ENCOUNTER — Telehealth: Payer: Self-pay

## 2014-07-15 LAB — PROTIME-INR
INR: 1.8 — AB (ref 0.8–1.2)
Prothrombin Time: 18.9 s — ABNORMAL HIGH (ref 9.1–12.0)

## 2014-07-15 NOTE — Telephone Encounter (Signed)
PT/INR  Results  PT 18.9    INR 1.8

## 2014-07-16 NOTE — Telephone Encounter (Signed)
Patient advised as directed below. Patient verbalized understanding and agrees with treatment plan. NW

## 2014-07-16 NOTE — Telephone Encounter (Signed)
Advise patient INR low at 1.8 (goal 2.0-3.0). Increase Coumadin to 10 mg MWF and continue 7.5 mg the rest of the week. Recheck protime in 1 month.

## 2014-07-16 NOTE — Telephone Encounter (Signed)
Attempted to contact patient on home and mobile number. No answer nor voicemail either number. NW

## 2014-07-20 ENCOUNTER — Other Ambulatory Visit: Payer: Self-pay | Admitting: Family Medicine

## 2014-08-04 ENCOUNTER — Encounter: Payer: Self-pay | Admitting: Surgery

## 2014-08-09 ENCOUNTER — Encounter: Payer: Self-pay | Admitting: Surgery

## 2014-08-09 ENCOUNTER — Ambulatory Visit (INDEPENDENT_AMBULATORY_CARE_PROVIDER_SITE_OTHER)
Admission: RE | Admit: 2014-08-09 | Discharge: 2014-08-09 | Disposition: A | Discharge status: Home / Self Care | Payer: Medicare Other | Source: Ambulatory Visit | Attending: Family | Admitting: Family

## 2014-08-09 ENCOUNTER — Ambulatory Visit (HOSPITAL_COMMUNITY)
Admission: RE | Admit: 2014-08-09 | Discharge: 2014-08-09 | Disposition: A | Discharge status: Home / Self Care | Payer: Medicare Other | Source: Ambulatory Visit | Attending: Family | Admitting: Family

## 2014-08-09 ENCOUNTER — Ambulatory Visit (INDEPENDENT_AMBULATORY_CARE_PROVIDER_SITE_OTHER): Payer: Medicare Other | Admitting: Surgery

## 2014-08-09 ENCOUNTER — Other Ambulatory Visit: Payer: Self-pay | Admitting: Surgery

## 2014-08-09 ENCOUNTER — Other Ambulatory Visit: Payer: Self-pay | Admitting: Family

## 2014-08-09 VITALS — BP 126/76 | HR 51 | Ht 74.0 in | Wt 210.8 lb

## 2014-08-09 DIAGNOSIS — I714 Abdominal aortic aneurysm, without rupture, unspecified: Secondary | ICD-10-CM

## 2014-08-09 DIAGNOSIS — Z48812 Encounter for surgical aftercare following surgery on the circulatory system: Secondary | ICD-10-CM

## 2014-08-09 DIAGNOSIS — T82858A Stenosis of vascular prosthetic devices, implants and grafts, initial encounter: Secondary | ICD-10-CM | POA: Diagnosis not present

## 2014-08-09 DIAGNOSIS — I724 Aneurysm of artery of lower extremity: Secondary | ICD-10-CM | POA: Diagnosis not present

## 2014-08-09 DIAGNOSIS — Z95820 Peripheral vascular angioplasty status with implants and grafts: Secondary | ICD-10-CM | POA: Diagnosis not present

## 2014-08-09 DIAGNOSIS — I70409 Unspecified atherosclerosis of autologous vein bypass graft(s) of the extremities, unspecified extremity: Secondary | ICD-10-CM | POA: Diagnosis not present

## 2014-08-09 DIAGNOSIS — I739 Peripheral vascular disease, unspecified: Secondary | ICD-10-CM

## 2014-08-09 DIAGNOSIS — I2510 Atherosclerotic heart disease of native coronary artery without angina pectoris: Secondary | ICD-10-CM

## 2014-08-09 NOTE — Progress Notes (Signed)
Patient name: [name] [name] MRN: [id number] DOB: July 19, 1945 Sex: male     Chief Complaint  Patient presents with  . Re-evaluation    HISTORY OF PRESENT ILLNESS: The patient comes in today for followup. He initially underwent endovascular abdominal aortic aneurysm repair in October of 2011. He has a known type II endoleak, however his aneurysm continues to decrease. He is also status post endovascular repair of a left popliteal artery aneurysm on October 2011.  He presented to the office on 10/21/2013 with a six-day history of pain and coldness in his left foot. He was found to have an occluded popliteal stent by angiography the following day. An attempt at from a lysis was performed, however the patient developed some cardiac issues as well as bleeding, and this had to be discontinued. The patient continued to have an ischemic foot and therefore he was taken to the operating room for revascularization. On 10/29/2013, he underwent femoral to posterior tibial artery bypass graft. He had an excellent caliber vein. The bypass graft occluded early and he was taken back. The bypass was safe to be re-opened. I explored the posterior tibial artery behind the ankle. I passed catheters all the way into the pedal arch and injected TPA. The patient was transitioned to oral anticoagulation and ultimately discharged. While he was in the hospital .  He developed bypass graft stenosis and on 05/25/2014 he underwent drug coated balloon angioplasty to the distal portion of his bypass graft.  He reports no complaints today other than some numbness to his left foot.  He also has numbness on the right foot  Past Medical History  Diagnosis Date  . Abdominal aortic aneurysm   . Popliteal aneurysm   . Hyperlipidemia   . Hypertension   . Coronary artery disease   . Detached retina   . Myocardial infarction     2007 heart stent  . Chronic kidney disease     Right kidney stone    Past Surgical History    Procedure Laterality Date  . Coronary stenting      2007  . Abdominal aortic aneurysm repair  11/24/09    EVAR  . Popliteal artery stent  11/2009    left popliteal artery   . Retinal detachment surgery Right 2011  . Tooth extraction  June 2015    Wisdom Tooth Extraction  . Femoral-popliteal bypass graft Left 10/29/2013    Procedure: BYPASS GRAFT FEMORAL-POPLITEAL ARTERY;  Surgeon: Nada Libman, MD;  Location: [facility] OR;  Service: Vascular;  Laterality: Left;  Left Femoral to Posterior Tibial artery bypass graft using nonreversed saphenous vein. and thrombectomy of Tibial arterly.  . Femoral-tibial bypass graft Left 10/30/2013    Procedure: Reexploration of Femoral-Posterior Tibial Bypass; Thrombectomy of Bypass; Left Leg Angiogram; Posterior Tibial Vein Patch Angioplasty;  Surgeon: Nada Libman, MD;  [facility];  Service: Vascular;  Laterality: Left;  Marland Kitchen Eye surgery Right     cataract removed  . Colonoscopy    . Amputation Left 12/10/2013    Procedure: AMPUTATION LEFT  FOURTH AND FIFTH TOES;  Surgeon: Nada Libman, MD;  Location: [facility] OR;  Service: Vascular;  Laterality: Left;  . Lower extremity angiogram N/A 05/25/2014    Procedure: LOWER EXTREMITY ANGIOGRAM;  Surgeon: Nada Libman, MD;  Location: [facility] CATH LAB;  Service: Cardiovascular;  Laterality: N/A;    History   Social History  . Marital Status: Married    Spouse Name: N/A  . Number  of Children: N/A  . Years of Education: N/A   Occupational History  . Retired    Social History Main Topics  . Smoking status: Former Smoker -- 15 years    Types: Cigarettes    Quit date: 09/21/1985  . Smokeless tobacco: Current User    Types: Chew  . Alcohol Use: No  . Drug Use: No  . Sexual Activity: Not on file   Other Topics Concern  . Not on file   Social History Narrative   Lives with family.    Family History  Problem Relation Age of Onset  . Aneurysm Sister     possibly   . Peripheral vascular disease Brother      Allergies as of 08/09/2014 - Review Complete 08/09/2014  Allergen Reaction Noted  . Omnipaque [iohexol] Rash 03/27/2010  . Oxytetracycline Rash 10/11/2009  . Alteplase Other (See Comments) 05/25/2014    Current Outpatient Prescriptions on File Prior to Visit  Medication Sig Dispense Refill  . aspirin 81 MG EC tablet Take 81 mg by mouth daily.      Marland Kitchen. atorvastatin (LIPITOR) 40 MG tablet TAKE ONE TABLET BY MOUTH ONCE DAILY*NEEDS OFFICE VISIT* 30 tablet 6  . metoprolol tartrate (LOPRESSOR) 25 MG tablet TAKE ONE TABLET BY MOUTH TWICE DAILY 60 tablet 0  . Multiple Vitamin (MULTIVITAMIN WITH MINERALS) TABS tablet Take 1 tablet by mouth daily.    . Omega-3 Fatty Acids (FISH OIL TRIPLE STRENGTH) 1400 MG CAPS Take 2,800 mg by mouth 2 (two) times daily.    . Saw Palmetto 450 MG CAPS Take 900 mg by mouth 2 (two) times daily.    Marland Kitchen. warfarin (COUMADIN) 10 MG tablet Take 1 tablet (10 mg total) by mouth daily. (Patient taking differently: Take 7.5 mg by mouth daily. ) 30 tablet 2   No current facility-administered medications on file prior to visit.     REVIEW OF SYSTEMS: See history of present illness, otherwise negative  PHYSICAL EXAMINATION:   Vital signs are  Filed Vitals:   08/09/14 1035  BP: 126/76  Pulse: 51  Height: 6\' 2"  (1.88 m)  Weight: 210 lb 12.8 oz (95.618 kg)  SpO2: 98%   Body mass index is 27.05 kg/(m^2). General: The patient appears their stated age. HEENT:  No gross abnormalities Pulmonary:  Non labored breathing Abdomen: Soft and non-tender Musculoskeletal: There are no major deformities. Neurologic: No focal weakness or paresthesias are detected, Skin: Left fourth and fifth dilatation site are healed. Psychiatric: The patient has normal affect. Cardiovascular: Left pedal pulses are not palpable   Diagnostic Studies Abdominal aortic aneurysm ultrasound was reviewed today.  No endoleak is identified.  Maximum diameter is 5.0 cm.  ABI today is 0.65 on the left  (previously was 0.60).  On the right it measures 0.84  Assessment: #1: Left leg bypass #2: Abdominal aneurysm Plan: #1: The velocities in the left leg have significantly improved since his most recent intervention.  He no longer has an open wound.  I will continue with close ultrasound surveillance.  The next study will be in 6 months. #2: His aneurysm remains stable.  He will have a repeat ultrasound in one year  V. Charlena CrossWells Kenlei Safi IV, M.D. [facility] [place] Office: [phone] Pager:  [phone]

## 2014-08-13 ENCOUNTER — Ambulatory Visit (INDEPENDENT_AMBULATORY_CARE_PROVIDER_SITE_OTHER): Payer: Medicare Other

## 2014-08-13 DIAGNOSIS — H3320 Serous retinal detachment, unspecified eye: Secondary | ICD-10-CM | POA: Insufficient documentation

## 2014-08-13 DIAGNOSIS — I82409 Acute embolism and thrombosis of unspecified deep veins of unspecified lower extremity: Secondary | ICD-10-CM | POA: Diagnosis not present

## 2014-08-13 DIAGNOSIS — Z961 Presence of intraocular lens: Secondary | ICD-10-CM | POA: Insufficient documentation

## 2014-08-13 DIAGNOSIS — H43819 Vitreous degeneration, unspecified eye: Secondary | ICD-10-CM | POA: Insufficient documentation

## 2014-08-13 DIAGNOSIS — H269 Unspecified cataract: Secondary | ICD-10-CM | POA: Insufficient documentation

## 2014-08-13 LAB — POCT INR
INR: 2.9
PT: 34.5

## 2014-08-13 NOTE — Patient Instructions (Signed)
PT/INR  INR 2.9 Continue same dose of warfarin 10 mg MWF and 7.5 mg the other days. Come back in 2 weeks. Call if any questions or concerns.

## 2014-08-16 ENCOUNTER — Other Ambulatory Visit: Payer: Self-pay | Admitting: Family Medicine

## 2014-08-22 ENCOUNTER — Other Ambulatory Visit: Payer: Self-pay | Admitting: Family Medicine

## 2014-08-27 ENCOUNTER — Ambulatory Visit (INDEPENDENT_AMBULATORY_CARE_PROVIDER_SITE_OTHER): Payer: Medicare Other

## 2014-08-27 DIAGNOSIS — I82409 Acute embolism and thrombosis of unspecified deep veins of unspecified lower extremity: Secondary | ICD-10-CM | POA: Diagnosis not present

## 2014-08-27 LAB — POCT INR
INR: 2.7
PT: 32.3

## 2014-08-27 NOTE — Patient Instructions (Signed)
  INR today was 2.7  Continue same dose, Coumadin 10 mg MWF and 7.5 mg the other days. Call if any questions or concerns. Come back in 4 weeks.

## 2014-09-12 ENCOUNTER — Other Ambulatory Visit: Payer: Self-pay | Admitting: Family Medicine

## 2014-09-24 ENCOUNTER — Ambulatory Visit (INDEPENDENT_AMBULATORY_CARE_PROVIDER_SITE_OTHER): Payer: Medicare Other

## 2014-09-24 DIAGNOSIS — I82409 Acute embolism and thrombosis of unspecified deep veins of unspecified lower extremity: Secondary | ICD-10-CM

## 2014-09-24 LAB — POCT INR
INR: 2.4
PT: 28.8

## 2014-09-24 NOTE — Patient Instructions (Signed)
Continue same dose, Coumadin 10 mg M W & F and then 7.5 the other days. Call if any questions or concerns. Come back in 4 weeks.

## 2014-10-07 ENCOUNTER — Other Ambulatory Visit: Payer: Self-pay | Admitting: Family Medicine

## 2014-10-07 NOTE — Telephone Encounter (Signed)
[name] [name] patient 

## 2014-10-22 ENCOUNTER — Ambulatory Visit (INDEPENDENT_AMBULATORY_CARE_PROVIDER_SITE_OTHER): Payer: Medicare Other

## 2014-10-22 DIAGNOSIS — I82409 Acute embolism and thrombosis of unspecified deep veins of unspecified lower extremity: Secondary | ICD-10-CM

## 2014-10-22 LAB — POCT INR
INR: 3.2
PT: 38.5

## 2014-10-22 NOTE — Patient Instructions (Signed)
Skip Saturday; Then continue current dose:   M, W, F and 7.5mg  The rest of the week.

## 2014-10-28 ENCOUNTER — Other Ambulatory Visit: Payer: Self-pay | Admitting: Cardiovascular Disease

## 2014-11-19 ENCOUNTER — Ambulatory Visit (INDEPENDENT_AMBULATORY_CARE_PROVIDER_SITE_OTHER): Payer: Medicare Other

## 2014-11-19 DIAGNOSIS — I82409 Acute embolism and thrombosis of unspecified deep veins of unspecified lower extremity: Secondary | ICD-10-CM | POA: Diagnosis not present

## 2014-11-19 LAB — POCT INR
INR: 3.2
PT: 38.7

## 2014-11-19 NOTE — Patient Instructions (Signed)
Continue same dose:   M, W, F and 7.5mg  The rest of the week.  Call if any questions or concerns.

## 2014-11-28 ENCOUNTER — Other Ambulatory Visit: Payer: Self-pay | Admitting: Family Medicine

## 2014-12-17 ENCOUNTER — Ambulatory Visit (INDEPENDENT_AMBULATORY_CARE_PROVIDER_SITE_OTHER): Payer: Medicare Other

## 2014-12-17 DIAGNOSIS — I82409 Acute embolism and thrombosis of unspecified deep veins of unspecified lower extremity: Secondary | ICD-10-CM | POA: Diagnosis not present

## 2014-12-17 LAB — POCT INR
INR: 1.8
INR: 1.8
INR: 37.7
PT: 3.1

## 2014-12-17 LAB — PROTIME-INR
INR: 1.8 — AB (ref <=1.1)
INR: 37.7 — AB (ref <=1.1)

## 2014-12-17 NOTE — Patient Instructions (Signed)
Patient is to return in 4 weeks for next PT/INR check.

## 2014-12-20 ENCOUNTER — Other Ambulatory Visit: Payer: Self-pay | Admitting: Family Medicine

## 2014-12-27 ENCOUNTER — Other Ambulatory Visit: Payer: Self-pay | Admitting: Family Medicine

## 2014-12-27 NOTE — Progress Notes (Signed)
Error was placed in results for INR. Patient's INR was 1.8 NOT 37.7 that is entered into the chart.

## 2015-01-14 ENCOUNTER — Ambulatory Visit (INDEPENDENT_AMBULATORY_CARE_PROVIDER_SITE_OTHER): Payer: Medicare Other

## 2015-01-14 DIAGNOSIS — Z23 Encounter for immunization: Secondary | ICD-10-CM

## 2015-01-14 DIAGNOSIS — I82409 Acute embolism and thrombosis of unspecified deep veins of unspecified lower extremity: Secondary | ICD-10-CM | POA: Diagnosis not present

## 2015-01-14 LAB — POCT INR
INR: 3.4
PT: 40.7

## 2015-01-14 NOTE — Patient Instructions (Signed)
Anticoagulation Dose Instructions as of 01/14/2015      [name] Mon Tue Wed Thu Fri Sat   New Dose 7.5 mg 10 mg 7.5 mg 10 mg 7.5 mg 10 mg 7.5 mg    Description        Continue same dose:  10mg  M, W, F and 7.5mg  The rest of the week.  Call if any questions or concerns.     '

## 2015-01-26 DIAGNOSIS — H59811 Chorioretinal scars after surgery for detachment, right eye: Secondary | ICD-10-CM | POA: Diagnosis not present

## 2015-02-10 ENCOUNTER — Encounter: Payer: Self-pay | Admitting: Family

## 2015-02-11 ENCOUNTER — Ambulatory Visit (INDEPENDENT_AMBULATORY_CARE_PROVIDER_SITE_OTHER): Payer: Medicare Other

## 2015-02-11 DIAGNOSIS — I82409 Acute embolism and thrombosis of unspecified deep veins of unspecified lower extremity: Secondary | ICD-10-CM | POA: Diagnosis not present

## 2015-02-11 LAB — POCT INR
INR: 2.5
PT: 30.4

## 2015-02-11 NOTE — Patient Instructions (Signed)
Anticoagulation Dose Instructions as of 02/11/2015      [name] Mon Tue Wed Thu Fri Sat   New Dose 7.5 mg 10 mg 7.5 mg 10 mg 7.5 mg 10 mg 7.5 mg    Description        7.5 mg daily except 10 mg on M-W-F

## 2015-02-18 ENCOUNTER — Other Ambulatory Visit: Payer: Self-pay | Admitting: Surgery

## 2015-02-18 DIAGNOSIS — I70409 Unspecified atherosclerosis of autologous vein bypass graft(s) of the extremities, unspecified extremity: Secondary | ICD-10-CM

## 2015-02-18 DIAGNOSIS — I739 Peripheral vascular disease, unspecified: Secondary | ICD-10-CM

## 2015-02-18 DIAGNOSIS — Z48812 Encounter for surgical aftercare following surgery on the circulatory system: Secondary | ICD-10-CM

## 2015-02-18 DIAGNOSIS — I724 Aneurysm of artery of lower extremity: Secondary | ICD-10-CM

## 2015-02-21 ENCOUNTER — Encounter (HOSPITAL_COMMUNITY): Payer: Medicare Other

## 2015-02-21 ENCOUNTER — Ambulatory Visit: Payer: Medicare Other | Admitting: Family

## 2015-02-21 ENCOUNTER — Other Ambulatory Visit (HOSPITAL_COMMUNITY): Payer: Medicare Other

## 2015-02-25 ENCOUNTER — Ambulatory Visit (INDEPENDENT_AMBULATORY_CARE_PROVIDER_SITE_OTHER): Payer: Medicare Other

## 2015-02-25 DIAGNOSIS — I82409 Acute embolism and thrombosis of unspecified deep veins of unspecified lower extremity: Secondary | ICD-10-CM | POA: Diagnosis not present

## 2015-02-25 LAB — POCT INR
INR: 2.1
PT: 24.8

## 2015-02-25 NOTE — Patient Instructions (Signed)
Anticoagulation Dose Instructions as of 02/25/2015      [name] SmilesSun Mon Tue Wed Thu Fri Sat   New Dose 20 mg 7.5 mg 20 mg 7.5 mg 20 mg 7.5 mg 20 mg    Description        10 mg daily except 7.5 mg M-W-F F/U in 2 weeks

## 2015-03-11 ENCOUNTER — Ambulatory Visit (INDEPENDENT_AMBULATORY_CARE_PROVIDER_SITE_OTHER): Payer: Medicare Other

## 2015-03-11 DIAGNOSIS — I824Y9 Acute embolism and thrombosis of unspecified deep veins of unspecified proximal lower extremity: Secondary | ICD-10-CM

## 2015-03-11 LAB — POCT INR
INR: 2.4
PT: 28.5

## 2015-03-11 NOTE — Patient Instructions (Signed)
Anticoagulation Dose Instructions as of 03/11/2015      [name] [name] Tue Wed Thu Fri Sat   New Dose 10 mg 7.5 mg 10 mg 7.5 mg 10 mg 7.5 mg 10 mg    Description        10 mg daily except 7.5 mg M-W-F F/U in 4 weeks

## 2015-03-22 ENCOUNTER — Encounter: Payer: Self-pay | Admitting: Family

## 2015-03-28 ENCOUNTER — Encounter (HOSPITAL_COMMUNITY): Payer: Medicare Other

## 2015-03-28 ENCOUNTER — Other Ambulatory Visit (HOSPITAL_COMMUNITY): Payer: Medicare Other

## 2015-03-28 ENCOUNTER — Ambulatory Visit: Payer: Medicare Other | Admitting: Family

## 2015-03-29 ENCOUNTER — Ambulatory Visit (HOSPITAL_COMMUNITY)
Admission: RE | Admit: 2015-03-29 | Discharge: 2015-03-29 | Disposition: A | Discharge status: Home / Self Care | Payer: Medicare Other | Source: Ambulatory Visit | Attending: Family | Admitting: Family

## 2015-03-29 ENCOUNTER — Ambulatory Visit (INDEPENDENT_AMBULATORY_CARE_PROVIDER_SITE_OTHER)
Admission: RE | Admit: 2015-03-29 | Discharge: 2015-03-29 | Disposition: A | Discharge status: Home / Self Care | Payer: Medicare Other | Source: Ambulatory Visit | Attending: Surgery | Admitting: Surgery

## 2015-03-29 ENCOUNTER — Ambulatory Visit (INDEPENDENT_AMBULATORY_CARE_PROVIDER_SITE_OTHER): Payer: Medicare Other | Admitting: Family

## 2015-03-29 ENCOUNTER — Encounter: Payer: Self-pay | Admitting: Family

## 2015-03-29 VITALS — BP 122/76 | HR 64 | Ht 74.0 in | Wt 218.0 lb

## 2015-03-29 DIAGNOSIS — Z87891 Personal history of nicotine dependence: Secondary | ICD-10-CM | POA: Insufficient documentation

## 2015-03-29 DIAGNOSIS — Z95828 Presence of other vascular implants and grafts: Secondary | ICD-10-CM

## 2015-03-29 DIAGNOSIS — Z48812 Encounter for surgical aftercare following surgery on the circulatory system: Secondary | ICD-10-CM | POA: Diagnosis not present

## 2015-03-29 DIAGNOSIS — T82858A Stenosis of vascular prosthetic devices, implants and grafts, initial encounter: Secondary | ICD-10-CM

## 2015-03-29 DIAGNOSIS — I779 Disorder of arteries and arterioles, unspecified: Secondary | ICD-10-CM | POA: Diagnosis not present

## 2015-03-29 DIAGNOSIS — I70409 Unspecified atherosclerosis of autologous vein bypass graft(s) of the extremities, unspecified extremity: Secondary | ICD-10-CM

## 2015-03-29 DIAGNOSIS — I739 Peripheral vascular disease, unspecified: Secondary | ICD-10-CM

## 2015-03-29 DIAGNOSIS — Z4889 Encounter for other specified surgical aftercare: Secondary | ICD-10-CM

## 2015-03-29 DIAGNOSIS — I724 Aneurysm of artery of lower extremity: Secondary | ICD-10-CM | POA: Insufficient documentation

## 2015-03-29 DIAGNOSIS — I6523 Occlusion and stenosis of bilateral carotid arteries: Secondary | ICD-10-CM | POA: Diagnosis not present

## 2015-03-29 NOTE — Progress Notes (Addendum)
[facility] [place] HISTORY AND PHYSICAL -PAD  History of Present Illness [name] [name] is a 70 y.o. male patient of Dr. Myra Gianotti.  He is status post endovascular repair of abdominal aortic aneurysm in October of 2011. He has a known persistent type II endoleak from a patent lumbar artery, however the size of his aneurysm sac continues to decrease. 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 He is s/p amputation left 4th and 5th toes on 12/10/13, several attempts at revascularization until he had a left femoral popliteal BPG on 10/30/14.  He last saw Dr. Myra Gianotti on 08/09/14 At that time No endoleak is identified. Maximum diameter is 5.0 cm. ABI was is 0.65 on the left (previously was 0.60). On the right it measures 0.84 The velocities in the left leg had significantly improved since his most recent intervention. He no longer has an open wound. Will continue with close ultrasound surveillance. The next study will be in 6 months. His aneurysm remains stable. He will have a repeat ultrasound in one year.   Pt states he walks his a mile daily, he reports left calf "cramp" after walking 20-30 minutes, relieved with rest.  He denies abdominal or back pain.   He had an MI in 2007 with cardiac stent placement.  He denies any history of stroke or TIA.   Pt Diabetic: No  Pt smoker: former smoker, quit in 1988; uses oral tobacco since he quit smoking     Pt meds include: Statin :Yes Betablocker: Yes ASA: yes Other anticoagulants/antiplatelets: warfarin    Past Medical History  Diagnosis Date  . Abdominal aortic aneurysm   . Popliteal aneurysm   . Hyperlipidemia   . Hypertension   . Coronary artery disease   . Detached retina   . Myocardial infarction [facility])     2007 heart stent  . Chronic kidney disease     Right kidney stone    Social History Social History  Substance Use Topics  . Smoking  status: Former Smoker -- 15 years    Types: Cigarettes    Quit date: 09/21/1985  . Smokeless tobacco: Current User    Types: Chew  . Alcohol Use: No    Family History Family History  Problem Relation Age of Onset  . Aneurysm Sister     possibly   . Peripheral vascular disease Brother     Past Surgical History  Procedure Laterality Date  . Coronary stenting      2007  . Abdominal aortic aneurysm repair  11/24/09    EVAR  . Popliteal artery stent  11/2009    left popliteal artery   . Retinal detachment surgery Right 2011  . Tooth extraction  June 2015    Wisdom Tooth Extraction  . Femoral-popliteal bypass graft Left 10/29/2013    Procedure: BYPASS GRAFT FEMORAL-POPLITEAL ARTERY;  Surgeon: Nada Libman, MD;  Location: [facility] OR;  Service: Vascular;  Laterality: Left;  Left Femoral to Posterior Tibial artery bypass graft using nonreversed saphenous vein. and thrombectomy of Tibial arterly.  . Femoral-tibial bypass graft Left 10/30/2013    Procedure: Reexploration of Femoral-Posterior Tibial Bypass; Thrombectomy of Bypass; Left Leg Angiogram; Posterior Tibial Vein Patch Angioplasty;  Surgeon: Nada Libman, MD;  Location: [facility] OR;  Service: Vascular;  Laterality: Left;  Marland Kitchen Eye surgery Right     cataract removed  . Colonoscopy    . Amputation Left 12/10/2013    Procedure:  AMPUTATION LEFT  FOURTH AND FIFTH TOES;  Surgeon: Nada Libman, MD;  Location: [facility] OR;  Service: Vascular;  Laterality: Left;  . Lower extremity angiogram N/A 05/25/2014    Procedure: LOWER EXTREMITY ANGIOGRAM;  Surgeon: Nada Libman, MD;  Location: [facility] CATH LAB;  Service: Cardiovascular;  Laterality: N/A;    Allergies  Allergen Reactions  . Omnipaque [Iohexol] Rash    Pt requires full premeds and needs to bring a driver.  Broke out in rash on back and arms.  Ok w/ premeds  . Oxytetracycline Rash    Terramycin causes rash  . Alteplase Other (See Comments)    Internal bleeding, per RN in short stay. Patient  requested this to be added to his allergy list    Current Outpatient Prescriptions  Medication Sig Dispense Refill  . atorvastatin (LIPITOR) 40 MG tablet TAKE ONE TABLET BY MOUTH ONCE DAILY **NEED  OFFICE  VISIT** 30 tablet 8  . metoprolol tartrate (LOPRESSOR) 25 MG tablet TAKE ONE TABLET BY MOUTH TWICE DAILY 60 tablet 6  . Multiple Vitamin (MULTIVITAMIN WITH MINERALS) TABS tablet Take 1 tablet by mouth daily.    . Omega-3 Fatty Acids (FISH OIL TRIPLE STRENGTH) 1400 MG CAPS Take 2,800 mg by mouth 2 (two) times daily.    . Saw Palmetto 450 MG CAPS Take 900 mg by mouth 2 (two) times daily.    Marland Kitchen warfarin (COUMADIN) 5 MG tablet TAKE ONE & ONE-HALF TABLETS BY MOUTH ONCE DAILY AS DIRECTED 45 tablet 3   No current facility-administered medications for this visit.    ROS: See HPI for pertinent positives and negatives.   Physical Examination  Filed Vitals:   03/29/15 1510 03/29/15 1512  BP: 144/82 122/76  Pulse: 63 64  Height:  (1.88 m)   Weight: 218 lb (98.884 kg)   SpO2: 98%    Body mass index is 27.98 kg/(m^2).    General: A&O x 3, WD male  Pulmonary: Respirations are non labored, no cough. Cardiac: RRR  Vascular: Extremities: No gangrene, no open wounds. Left 4th and 5th toes are surgically absent.  Vessel  Right  Left   Radial  2+Palpable  2+Palpable   Carotid  without bruit  without bruit   Aorta  Not palpable  N/A   Femoral  2+Palpable  2+Palpable   Popliteal  Not palpable  Not palpable   PT   audible by Doppler  audible by Doppler  DP  audible by Doppler   audible by Doppler    Gastrointestinal: soft, NTND, -G/R, - HSM, - palpable masses, - CVAT B.  Musculoskeletal: M/S 5/5 throughout, see Vascular.  Neurologic: CN 2-12 grossly intact, Motor exam as listed above          Non-Invasive Vascular Imaging: DATE: 03/29/2015 LOWER EXTREMITY ARTERIAL DUPLEX EVALUATION    INDICATION: Follow up bypass graft     PREVIOUS INTERVENTION(S):  Left femoral to proximal posterior tibial artery bypass graft on 10/29/13 with thrombectomy on 10/30/13 and angioplasty on 05/25/14; H/O left popliteal stent repair in 2011 with subsequent occlusion; EVAR in 2011    DUPLEX EXAM:     RIGHT  LEFT   Peak Systolic Velocity (cm/s) Ratio (if abnormal) Waveform  Peak Systolic Velocity (cm/s) Ratio (if abnormal) Waveform     Inflow Artery 78  T     Proximal Anastomosis 49  B     Proximal Graft 64  M     Mid Graft 37  M  Distal Graft 42  M     Distal Anastomosis 54  M     Outflow Artery 68  M  0.80 Today's ABI / TBI 0.60  0.84 Previous ABI / TBI (08/09/14 ) 0.65    Waveform:    M - Monophasic       B - Biphasic       T - Triphasic  If Ankle Brachial Index (ABI) or Toe Brachial Index (TBI) performed, please see complete report     ADDITIONAL FINDINGS: . No internal vessel narrowing noted within the left bypass graft or anastomosis. . Known occlusion of the left mid posterior tibial artery with collaterals noted.    IMPRESSION: 1. Patent left leg bypass graft with no evidence of stenosis noted.    Compared to the previous exam:  No significant change when compared to the previous exam on 08/09/14.     ASSESSMENT: [name] [name] is a 70 y.o. male who is status post endovascular repair of abdominal aortic aneurysm in October of 2011. He has a known persistent type II endoleak from a patent lumbar artery, however the size of his aneurysm sac continues to decrease. 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  He is s/p amputation left 4th and 5th toes on 12/10/13, several attempts at revascularization until he had a left femoral popliteal BPG on 10/30/14.  He has mild claudication in his left calf, no signs of ischemia in his feet/legs. Today's left LE arterial duplex suggests known occlusion of the left mid posterior tibial artery with collaterals.  Patent left leg bypass graft with no evidence  of stenosis noted.  No significant change when compared to the previous exam on 08/09/14.  Bilateral ABI's are slightly reduced compared to 7 months ago.  His atherosclerotic risk factors include prior smoking, current use of smokeless tobacco, and CAD.    PLAN:  The patient was counseled re smokeless tobacco cessation and given several free resources re tobacco cessation. Graduated walking program discussed and how to execute such.  Based on the patient's vascular studies and examination, pt will return to clinic in 5  months with carotid duplex, ABI's, and left LE arterial duplex. He is scheduled 08/15/15 for AAA duplex and see me. I advised pt to notify us immediatly if he develops concerns re the circulation in his legs.   I discussed in depth with the patient the nature of atherosclerosis, and emphasized the importance of maximal medical management including strict control of blood pressure, blood glucose, and lipid levels, obtaining regular exercise, and cessation of smokeless tobacco use.  The patient is aware that without maximal medical management the underlying atherosclerotic disease process will progress, limiting the benefit of any interventions.  The patient was given information about PAD including signs, symptoms, treatment, what symptoms should prompt the patient to seek immediate medical care, and risk reduction measures to take.  Charisse March, RN, MSN, FNP-C [facility] [place] Phone: [phone]  Clinic MD: Hart Rochester  03/29/2015 2:49 PM

## 2015-03-29 NOTE — Patient Instructions (Signed)
Stroke Prevention Some medical conditions and behaviors are associated with an increased chance of having a stroke. You may prevent a stroke by making healthy choices and managing medical conditions. HOW CAN I REDUCE MY RISK OF HAVING A STROKE?   Stay physically active. Get at least 30 minutes of activity on most or all days.  Do not smoke. It may also be helpful to avoid exposure to secondhand smoke.  Limit alcohol use. Moderate alcohol use is considered to be:  No more than 2 drinks per day for men.  No more than 1 drink per day for nonpregnant women.  Eat healthy foods. This involves:  Eating 5 or more servings of fruits and vegetables a day.  Making dietary changes that address high blood pressure (hypertension), high cholesterol, diabetes, or obesity.  Manage your cholesterol levels.  Making food choices that are high in fiber and low in saturated fat, trans fat, and cholesterol may control cholesterol levels.  Take any prescribed medicines to control cholesterol as directed by your health care provider.  Manage your diabetes.  Controlling your carbohydrate and sugar intake is recommended to manage diabetes.  Take any prescribed medicines to control diabetes as directed by your health care provider.  Control your hypertension.  Making food choices that are low in salt (sodium), saturated fat, trans fat, and cholesterol is recommended to manage hypertension.  Ask your health care provider if you need treatment to lower your blood pressure. Take any prescribed medicines to control hypertension as directed by your health care provider.  If you are 18-39 years of age, have your blood pressure checked every 3-5 years. If you are 40 years of age or older, have your blood pressure checked every year.  Maintain a healthy weight.  Reducing calorie intake and making food choices that are low in sodium, saturated fat, trans fat, and cholesterol are recommended to manage  weight.  Stop drug abuse.  Avoid taking birth control pills.  Talk to your health care provider about the risks of taking birth control pills if you are over 35 years old, smoke, get migraines, or have ever had a blood clot.  Get evaluated for sleep disorders (sleep apnea).  Talk to your health care provider about getting a sleep evaluation if you snore a lot or have excessive sleepiness.  Take medicines only as directed by your health care provider.  For some people, aspirin or blood thinners (anticoagulants) are helpful in reducing the risk of forming abnormal blood clots that can lead to stroke. If you have the irregular heart rhythm of atrial fibrillation, you should be on a blood thinner unless there is a good reason you cannot take them.  Understand all your medicine instructions.  Make sure that other conditions (such as anemia or atherosclerosis) are addressed. SEEK IMMEDIATE MEDICAL CARE IF:   You have sudden weakness or numbness of the face, arm, or leg, especially on one side of the body.  Your face or eyelid droops to one side.  You have sudden confusion.  You have trouble speaking (aphasia) or understanding.  You have sudden trouble seeing in one or both eyes.  You have sudden trouble walking.  You have dizziness.  You have a loss of balance or coordination.  You have a sudden, severe headache with no known cause.  You have new chest pain or an irregular heartbeat. Any of these symptoms may represent a serious problem that is an emergency. Do not wait to see if the symptoms will   go away. Get medical help at once. Call your local emergency services (911 in [place] Do not drive yourself to the hospital.   This information is not intended to replace advice given to you by your health care provider. Make sure you discuss any questions you have with your health care provider.   Document Released: 03/08/2004 Document Revised: 02/19/2014 Document Reviewed:  08/01/2012 Elsevier Interactive Patient Education 2016 Elsevier Inc.    Peripheral Vascular Disease Peripheral vascular disease (PVD) is a disease of the blood vessels that are not part of your heart and brain. A simple term for PVD is poor circulation. In most cases, PVD narrows the blood vessels that carry blood from your heart to the rest of your body. This can result in a decreased supply of blood to your arms, legs, and internal organs, like your stomach or kidneys. However, it most often affects a person's lower legs and feet. There are two types of PVD.  Organic PVD. This is the more common type. It is caused by damage to the structure of blood vessels.  Functional PVD. This is caused by conditions that make blood vessels contract and tighten (spasm). Without treatment, PVD tends to get worse over time. PVD can also lead to acute ischemic limb. This is when an arm or limb suddenly has trouble getting enough blood. This is a medical emergency. CAUSES Each type of PVD has many different causes. The most common cause of PVD is buildup of a fatty material (plaque) inside of your arteries (atherosclerosis). Small amounts of plaque can break off from the walls of the blood vessels and become lodged in a smaller artery. This blocks blood flow and can cause acute ischemic limb. Other common causes of PVD include:  Blood clots that form inside of blood vessels.  Injuries to blood vessels.  Diseases that cause inflammation of blood vessels or cause blood vessel spasms.  Health behaviors and health history that increase your risk of developing PVD. RISK FACTORS  You may have a greater risk of PVD if you:  Have a family history of PVD.  Have certain medical conditions, including:  High cholesterol.  Diabetes.  High blood pressure (hypertension).  Coronary heart disease.  Past problems with blood clots.  Past injury, such as burns or a broken bone. These may have damaged blood  vessels in your limbs.  Buerger disease. This is caused by inflamed blood vessels in your hands and feet.  Some forms of arthritis.  Rare birth defects that affect the arteries in your legs.  Use tobacco.  Do not get enough exercise.  Are obese.  Are age 50 or older. SIGNS AND SYMPTOMS  PVD may cause many different symptoms. Your symptoms depend on what part of your body is not getting enough blood. Some common signs and symptoms include:  Cramps in your lower legs. This may be a symptom of poor leg circulation (claudication).  Pain and weakness in your legs while you are physically active that goes away when you rest (intermittent claudication).  Leg pain when at rest.  Leg numbness, tingling, or weakness.  Coldness in a leg or foot, especially when compared with the other leg.  Skin or hair changes. These can include:  Hair loss.  Shiny skin.  Pale or bluish skin.  Thick toenails.  Inability to get or maintain an erection (erectile dysfunction). People with PVD are more prone to developing ulcers and sores on their toes, feet, or legs. These may take longer than   normal to heal. DIAGNOSIS Your health care provider may diagnose PVD from your signs and symptoms. The health care provider will also do a physical exam. You may have tests to find out what is causing your PVD and determine its severity. Tests may include:  Blood pressure recordings from your arms and legs and measurements of the strength of your pulses (pulse volume recordings).  Imaging studies using sound waves to take pictures of the blood flow through your blood vessels (Doppler ultrasound).  Injecting a dye into your blood vessels before having imaging studies using:  X-rays (angiogram or arteriogram).  Computer-generated X-rays (CT angiogram).  A powerful electromagnetic field and a computer (magnetic resonance angiogram or MRA). TREATMENT Treatment for PVD depends on the cause of your condition  and the severity of your symptoms. It also depends on your age. Underlying causes need to be treated and controlled. These include long-lasting (chronic) conditions, such as diabetes, high cholesterol, and high blood pressure. You may need to first try making lifestyle changes and taking medicines. Surgery may be needed if these do not work. Lifestyle changes may include:  Quitting smoking.  Exercising regularly.  Following a low-fat, low-cholesterol diet. Medicines may include:  Blood thinners to prevent blood clots.  Medicines to improve blood flow.  Medicines to improve your blood cholesterol levels. Surgical procedures may include:  A procedure that uses an inflated balloon to open a blocked artery and improve blood flow (angioplasty).  A procedure to put in a tube (stent) to keep a blocked artery open (stent implant).  Surgery to reroute blood flow around a blocked artery (peripheral bypass surgery).  Surgery to remove dead tissue from an infected wound on the affected limb.  Amputation. This is surgical removal of the affected limb. This may be necessary in cases of acute ischemic limb that are not improved through medical or surgical treatments. HOME CARE INSTRUCTIONS  Take medicines only as directed by your health care provider.  Do not use any tobacco products, including cigarettes, chewing tobacco, or electronic cigarettes. If you need help quitting, ask your health care provider.  Lose weight if you are overweight, and maintain a healthy weight as directed by your health care provider.  Eat a diet that is low in fat and cholesterol. If you need help, ask your health care provider.  Exercise regularly. Ask your health care provider to suggest some good activities for you.  Use compression stockings or other mechanical devices as directed by your health care provider.  Take good care of your feet.  Wear comfortable shoes that fit well.  Check your feet often for  any cuts or sores. SEEK MEDICAL CARE IF:  You have cramps in your legs while walking.  You have leg pain when you are at rest.  You have coldness in a leg or foot.  Your skin changes.  You have erectile dysfunction.  You have cuts or sores on your feet that are not healing. SEEK IMMEDIATE MEDICAL CARE IF:  Your arm or leg turns cold and blue.  Your arms or legs become red, warm, swollen, painful, or numb.  You have chest pain or trouble breathing.  You suddenly have weakness in your face, arm, or leg.  You become very confused or lose the ability to speak.  You suddenly have a very bad headache or lose your vision.   This information is not intended to replace advice given to you by your health care provider. Make sure you discuss any questions   you have with your health care provider.   Document Released: 03/08/2004 Document Revised: 02/19/2014 Document Reviewed: 07/09/2013 Elsevier Interactive Patient Education 2016 Elsevier Inc.     Smokeless Tobacco Use Smokeless tobacco is a loose, fine, or stringy tobacco. The tobacco is not smoked like a cigarette, but it is chewed or held in the lips or cheeks. It resembles tea and comes from the leaves of the tobacco plant. Smokeless tobacco is usually flavored, sweetened, or processed in some way. Although smokeless tobacco is not smoked into the lungs, its chemicals are absorbed through the membranes in the mouth and into the bloodstream. Its chemicals are also swallowed in saliva. The chemicals (nicotine and other toxins) are known to cause cancer. Smokeless tobacco contains up to 28 differentcarcinogens. CAUSES Nicotine is addictive. Smokeless tobacco contains nicotine, which is a stimulant. This stimulant can give you a "buzz" or altered state. People can become addicted to the feeling it delivers.  SYMPTOMS Smokeless tobacco can cause health problems, including:  Bad breath.  Yellow-brown teeth.  Mouth  sores.  Cracking and bleeding lips.  Gum disease, gum recession, and bone loss around the teeth.  Tooth decay.  Increased or irregular heart rate.  High blood pressure, heart disease, and stroke.  Cancer of the mouth, lips, tongue, pancreas, voice box (larynx), esophagus, colon, and bladder.  Precancerous lesion of the soft tissues of the mouth (leukoplakia).  Loss of your sense of taste. TREATMENT Talk with your caregiver about ways you can quit. Quitting tobacco is a good decision for your health. Nicotine is addictive, but several options are available to help you quit including:  Nicotine replacement therapy (gum or patch).  Support and cessation programs. The following tips can help you quit:  Write down the reasons you would like to quit and look at them often.  Set a date during a low stress time to stop or cut back.  Ask family and friends for their support.  Remove all tobacco products from your home and work.  Replace the chewing tobacco with things like beef jerky, sunflower seeds, or shredded coconut.  Avoid situations that may make you want to chew tobacco.  Exercise and eat a healthy diet.  When you crave tobacco, distract yourself with drinking water, sugarless chewing gum, sugarless hard candy, exercising, or deep breathing. HOME CARE INSTRUCTIONS  See your dentist for regular oral health exams every 6 months.  Follow up with your caregiver as recommended. SEEK MEDICAL CARE OR DENTAL CARE IF:  You have bleeding or cracking lips, gums, or cheeks.  You have mouth sores, discolorations, or pain.  You have tooth pain.  You develop persistent irritation, burning, or sores in the mouth.  You have pain, tenderness, or numbness in the mouth.  You develop a lump, bumpy patch, or hardened skin inside the mouth.  The color changes inside your mouth (gray, white, or red spots).  You have difficulty chewing, swallowing, or speaking.   This  information is not intended to replace advice given to you by your health care provider. Make sure you discuss any questions you have with your health care provider.   Document Released: 07/03/2010 Document Revised: 04/23/2011 Document Reviewed: 07/03/2010 Elsevier Interactive Patient Education 2016 Elsevier Inc.  

## 2015-03-30 NOTE — Addendum Note (Signed)
Addended by: Sharee Pimple on: 03/30/2015 09:50 AM   Modules accepted: Orders

## 2015-04-07 ENCOUNTER — Other Ambulatory Visit: Payer: Self-pay | Admitting: Family Medicine

## 2015-04-08 ENCOUNTER — Ambulatory Visit (INDEPENDENT_AMBULATORY_CARE_PROVIDER_SITE_OTHER): Payer: Medicare Other

## 2015-04-08 DIAGNOSIS — I82409 Acute embolism and thrombosis of unspecified deep veins of unspecified lower extremity: Secondary | ICD-10-CM | POA: Diagnosis not present

## 2015-04-08 LAB — POCT INR
INR: 2.8
PT: 33

## 2015-04-08 NOTE — Patient Instructions (Signed)
Anticoagulation Dose Instructions as of 04/08/2015      Glynis Smiles Tue Wed Thu Fri Sat   New Dose 10 mg 7.5 mg 10 mg 7.5 mg 10 mg 7.5 mg 10 mg    Description        10 mg daily except 7.5 mg M-W-F F/U in 4 weeks

## 2015-04-19 ENCOUNTER — Encounter: Payer: Self-pay | Admitting: *Deleted

## 2015-05-06 ENCOUNTER — Ambulatory Visit (INDEPENDENT_AMBULATORY_CARE_PROVIDER_SITE_OTHER): Payer: Medicare Other | Admitting: Family Medicine

## 2015-05-06 ENCOUNTER — Encounter: Payer: Self-pay | Admitting: Family Medicine

## 2015-05-06 VITALS — BP 112/62 | HR 60 | Temp 98.6°F | Resp 14 | Wt 219.4 lb

## 2015-05-06 DIAGNOSIS — I10 Essential (primary) hypertension: Secondary | ICD-10-CM | POA: Diagnosis not present

## 2015-05-06 DIAGNOSIS — I779 Disorder of arteries and arterioles, unspecified: Secondary | ICD-10-CM

## 2015-05-06 DIAGNOSIS — E785 Hyperlipidemia, unspecified: Secondary | ICD-10-CM | POA: Diagnosis not present

## 2015-05-06 DIAGNOSIS — I82409 Acute embolism and thrombosis of unspecified deep veins of unspecified lower extremity: Secondary | ICD-10-CM

## 2015-05-06 LAB — POCT INR
INR: 3.4
PT: 40.5

## 2015-05-06 NOTE — Progress Notes (Signed)
Patient ID: [name] [name], male   DOB: 02/09/1946, 70 y.o.   MRN: [id number]   Patient: [name] [name] Male    DOB: 12/01/1945   70 y.o.   MRN: [id number] Visit Date: 05/06/2015  Today's Provider: Dortha Kernennis [name], PA   Chief Complaint  Patient presents with  . Hyperlipidemia  . Hypertension  . Follow-up   Subjective:    Hyperlipidemia This is a chronic problem. The current episode started more than 1 year ago. The problem is controlled. Current antihyperlipidemic treatment includes statins. There are no compliance problems.  Risk factors for coronary artery disease include dyslipidemia and male sex.  Hypertension This is a chronic problem. The current episode started more than 1 year ago. The problem is controlled. There are no associated agents to hypertension. Risk factors for coronary artery disease include dyslipidemia and male gender. Treatments tried: Metoprolol. There are no compliance problems.     Past Medical History  Diagnosis Date  . Abdominal aortic aneurysm (HCC)   . Popliteal aneurysm (HCC)   . Hyperlipidemia   . Hypertension   . Coronary artery disease   . Detached retina   . Myocardial infarction [facility](HCC)     2007 heart stent  . Chronic kidney disease     Right kidney stone   Past Surgical History  Procedure Laterality Date  . Coronary stenting      2007  . Abdominal aortic aneurysm repair  11/24/09    EVAR  . Popliteal artery stent  11/2009    left popliteal artery   . Retinal detachment surgery Right 2011  . Tooth extraction  June 2015    Wisdom Tooth Extraction  . Femoral-popliteal bypass graft Left 10/29/2013    Procedure: BYPASS GRAFT FEMORAL-POPLITEAL ARTERY;  Surgeon: Nada LibmanVance W Brabham, MD;  Location: [facility] OR;  Service: Vascular;  Laterality: Left;  Left Femoral to Posterior Tibial artery bypass graft using nonreversed saphenous vein. and thrombectomy of Tibial arterly.  . Femoral-tibial bypass graft Left 10/30/2013    Procedure: Reexploration of  Femoral-Posterior Tibial Bypass; Thrombectomy of Bypass; Left Leg Angiogram; Posterior Tibial Vein Patch Angioplasty;  Surgeon: Nada LibmanVance W Brabham, MD;  [facility];  Service: Vascular;  Laterality: Left;  Marland Kitchen. Eye surgery Right     cataract removed  . Colonoscopy    . Amputation Left 12/10/2013    Procedure: AMPUTATION LEFT  FOURTH AND FIFTH TOES;  Surgeon: Nada LibmanVance W Brabham, MD;  [facility];  Service: Vascular;  Laterality: Left;  . Lower extremity angiogram N/A 05/25/2014    Procedure: LOWER EXTREMITY ANGIOGRAM;  Surgeon: Nada LibmanVance W Brabham, MD;  [facility];  Service: Cardiovascular;  Laterality: N/A;   Family History  Problem Relation Age of Onset  . Aneurysm Sister     possibly   . Peripheral vascular disease Brother    Previous Medications   ATORVASTATIN (LIPITOR) 40 MG TABLET    TAKE ONE TABLET BY MOUTH ONCE DAILY **NEED  OFFICE  VISIT**   METOPROLOL TARTRATE (LOPRESSOR) 25 MG TABLET    TAKE ONE TABLET BY MOUTH TWICE DAILY   MULTIPLE VITAMIN (MULTIVITAMIN WITH MINERALS) TABS TABLET    Take 1 tablet by mouth daily.   OMEGA-3 FATTY ACIDS (FISH OIL TRIPLE STRENGTH) 1400 MG CAPS    Take 2,800 mg by mouth 2 (two) times daily.   SAW PALMETTO 450 MG CAPS    Take 900 mg by mouth 2 (two) times daily.   WARFARIN (COUMADIN) 5 MG TABLET  TAKE ONE & ONE-HALF TABLETS BY MOUTH ONCE DAILY AS DIRECTED   Allergies  Allergen Reactions  . Omnipaque [Iohexol] Rash    Pt requires full premeds and needs to bring a driver.  Broke out in rash on back and arms.  Ok w/ premeds  . Oxytetracycline Rash    Terramycin causes rash  . Alteplase Other (See Comments)    Internal bleeding, per RN in short stay. Patient requested this to be added to his allergy list    Review of Systems  Constitutional: Negative.   HENT: Negative.   Eyes: Negative.   Respiratory: Negative.   Cardiovascular: Negative.   Gastrointestinal: Positive for diarrhea.  Endocrine: Negative.   Genitourinary: Negative.     Musculoskeletal: Negative.   Skin: Negative.   Allergic/Immunologic: Negative.   Neurological: Negative.   Hematological: Negative.     Social History  Substance Use Topics  . Smoking status: Former Smoker -- 15 years    Types: Cigarettes    Quit date: 09/21/1985  . Smokeless tobacco: Current User    Types: Chew  . Alcohol Use: No   Objective:   BP 112/62 mmHg  Pulse 60  Temp(Src) 98.6 F (37 C) (Oral)  Resp 14  Wt 219 lb 6.4 oz (99.519 kg)  Physical Exam  Constitutional: He is oriented to person, place, and time. He appears well-developed and well-nourished.  HENT:  Head: Normocephalic.  Right Ear: External ear normal.  Left Ear: External ear normal.  Nose: Nose normal.  Mouth/Throat: Oropharynx is clear and moist.  Edentulous compensated.  Eyes: Conjunctivae and EOM are normal.  Poor vision in right eye since retinal detachment in 2011.  Neck: Normal range of motion. Neck supple.  Cardiovascular: Normal rate, regular rhythm and normal heart sounds.   Pulmonary/Chest: Breath sounds normal.  Abdominal: Soft. Bowel sounds are normal.  Musculoskeletal:  Warm extremities. Well healed scars right leg from past DVT and arterial occlusive disease. Well healed right foot from amputation of 4th and 5th toes. Decreased sensation/numbness in right foot.  Neurological: He is alert and oriented to person, place, and time.      Assessment & Plan:     1. Hyperlipidemia Stable. Following low fat diet and tolerating Lipitor with Omega-3 daily. No myalgias. Will recheck labs and continue present regimen. - Comprehensive metabolic panel - Lipid panel  2. DVT (deep venous thrombosis), unspecified laterality No swelling or redness in extremities. Protime INR is 3.4 today (therapeutic range 2.5-3.5). Continue present Coumadin dosage and recheck in 1 month. - POCT INR  3. Essential hypertension Stable and well controlled. Tolerating Metoprolol without side effects. Will recheck  routine labs. - CBC with Differential/Platelet

## 2015-05-06 NOTE — Patient Instructions (Signed)
Continue 10 mg daily except 7.5 mg M-W-F F/U in 4 weeks

## 2015-05-07 LAB — CBC WITH DIFFERENTIAL/PLATELET
BASOS: 0 %
Basophils Absolute: 0 10*3/uL (ref 0.0–0.2)
EOS (ABSOLUTE): 0.4 10*3/uL (ref 0.0–0.4)
Eos: 4 %
HEMATOCRIT: 47 % (ref 37.5–51.0)
Hemoglobin: 16.6 g/dL (ref 12.6–17.7)
Immature Grans (Abs): 0 10*3/uL (ref 0.0–0.1)
Immature Granulocytes: 0 %
LYMPHS ABS: 1.6 10*3/uL (ref 0.7–3.1)
LYMPHS: 16 %
MCH: 31.7 pg (ref 26.6–33.0)
MCHC: 35.3 g/dL (ref 31.5–35.7)
MCV: 90 fL (ref 79–97)
MONOS ABS: 0.7 10*3/uL (ref 0.1–0.9)
Monocytes: 7 %
Neutrophils Absolute: 7.7 10*3/uL — ABNORMAL HIGH (ref 1.4–7.0)
Neutrophils: 73 %
PLATELETS: 224 10*3/uL (ref 150–379)
RBC: 5.24 x10E6/uL (ref 4.14–5.80)
RDW: 14 % (ref 12.3–15.4)
WBC: 10.5 10*3/uL (ref 3.4–10.8)

## 2015-05-07 LAB — LIPID PANEL
CHOL/HDL RATIO: 4.7 ratio (ref 0.0–5.0)
CHOLESTEROL TOTAL: 136 mg/dL (ref 100–199)
HDL: 29 mg/dL — AB (ref >39)
LDL Calculated: 49 mg/dL (ref 0–99)
TRIGLYCERIDES: 292 mg/dL — AB (ref 0–149)
VLDL Cholesterol Cal: 58 mg/dL — ABNORMAL HIGH (ref 5–40)

## 2015-05-07 LAB — COMPREHENSIVE METABOLIC PANEL
A/G RATIO: 1.8 (ref 1.2–2.2)
ALK PHOS: 74 IU/L (ref 39–117)
ALT: 47 IU/L — AB (ref 0–44)
AST: 34 IU/L (ref 0–40)
Albumin: 4.6 g/dL (ref 3.5–4.8)
BILIRUBIN TOTAL: 1.8 mg/dL — AB (ref 0.0–1.2)
BUN/Creatinine Ratio: 10 (ref 10–22)
BUN: 11 mg/dL (ref 8–27)
CHLORIDE: 105 mmol/L (ref 96–106)
CO2: 22 mmol/L (ref 18–29)
Calcium: 9.5 mg/dL (ref 8.6–10.2)
Creatinine, Ser: 1.08 mg/dL (ref 0.76–1.27)
GFR calc Af Amer: 79 mL/min/{1.73_m2} (ref >59)
GFR, EST NON AFRICAN AMERICAN: 69 mL/min/{1.73_m2} (ref >59)
GLOBULIN, TOTAL: 2.6 g/dL (ref 1.5–4.5)
Glucose: 103 mg/dL — ABNORMAL HIGH (ref 65–99)
POTASSIUM: 5 mmol/L (ref 3.5–5.2)
SODIUM: 143 mmol/L (ref 134–144)
Total Protein: 7.2 g/dL (ref 6.0–8.5)

## 2015-05-09 ENCOUNTER — Telehealth: Payer: Self-pay

## 2015-05-09 NOTE — Telephone Encounter (Signed)
Patient advised as directed below. Patient verbalized understanding and agrees with plan of care.  

## 2015-05-09 NOTE — Telephone Encounter (Signed)
-----   Message from Tamsen Roersennis E [name], [place] sent at 05/09/2015  8:01 AM EDT ----- Blood tests essentially normal except high triglycerides and low HDL. Probably need to watch diet closer (lower fats) and exercise more. Probably will be easier with warmer weather. Continue present medications. Recheck coumadin as planned in a month. Recheck cholesterol and triglycerides in 3 months.

## 2015-06-03 ENCOUNTER — Ambulatory Visit (INDEPENDENT_AMBULATORY_CARE_PROVIDER_SITE_OTHER): Payer: Medicare Other

## 2015-06-03 DIAGNOSIS — I82409 Acute embolism and thrombosis of unspecified deep veins of unspecified lower extremity: Secondary | ICD-10-CM | POA: Diagnosis not present

## 2015-06-03 LAB — POCT INR
INR: 2.8
PT: 33.9

## 2015-06-03 NOTE — Patient Instructions (Signed)
Anticoagulation Dose Instructions as of 06/03/2015      [name] Mon Tue Wed Thu Fri Sat   New Dose 10 mg 7.5 mg 10 mg 7.5 mg 10 mg 7.5 mg 10 mg    Description        10 mg daily except 7.5 mg M-W-F F/U in 4 weeks

## 2015-06-27 ENCOUNTER — Encounter: Payer: Self-pay | Admitting: Family Medicine

## 2015-06-27 ENCOUNTER — Telehealth: Payer: Self-pay | Admitting: Family Medicine

## 2015-06-27 ENCOUNTER — Ambulatory Visit (INDEPENDENT_AMBULATORY_CARE_PROVIDER_SITE_OTHER): Payer: Medicare Other | Admitting: Family Medicine

## 2015-06-27 VITALS — BP 112/76 | HR 62 | Temp 98.2°F | Resp 14 | Wt 217.0 lb

## 2015-06-27 DIAGNOSIS — R1032 Left lower quadrant pain: Secondary | ICD-10-CM | POA: Diagnosis not present

## 2015-06-27 DIAGNOSIS — Z9889 Other specified postprocedural states: Secondary | ICD-10-CM

## 2015-06-27 DIAGNOSIS — K5730 Diverticulosis of large intestine without perforation or abscess without bleeding: Secondary | ICD-10-CM | POA: Diagnosis not present

## 2015-06-27 DIAGNOSIS — I779 Disorder of arteries and arterioles, unspecified: Secondary | ICD-10-CM | POA: Diagnosis not present

## 2015-06-27 DIAGNOSIS — Z91041 Radiographic dye allergy status: Secondary | ICD-10-CM | POA: Diagnosis not present

## 2015-06-27 MED ORDER — METRONIDAZOLE 500 MG PO TABS: 500.0000 mg | ORAL_TABLET | Freq: Three times a day (TID) | ORAL | Status: DC

## 2015-06-27 NOTE — Telephone Encounter (Signed)
FYI--Pt does not want CTA set up until after he speaks to vascular surgeon

## 2015-06-27 NOTE — Patient Instructions (Signed)
Diverticulitis °Diverticulitis is inflammation or infection of small pouches in your colon that form when you have a condition called diverticulosis. The pouches in your colon are called diverticula. Your colon, or large intestine, is where water is absorbed and stool is formed. °Complications of diverticulitis can include: °· Bleeding. °· Severe infection. °· Severe pain. °· Perforation of your colon. °· Obstruction of your colon. °CAUSES  °Diverticulitis is caused by bacteria. °Diverticulitis happens when stool becomes trapped in diverticula. This allows bacteria to grow in the diverticula, which can lead to inflammation and infection. °RISK FACTORS °People with diverticulosis are at risk for diverticulitis. Eating a diet that does not include enough fiber from fruits and vegetables may make diverticulitis more likely to develop. °SYMPTOMS  °Symptoms of diverticulitis may include: °· Abdominal pain and tenderness. The pain is normally located on the left side of the abdomen, but may occur in other areas. °· Fever and chills. °· Bloating. °· Cramping. °· Nausea. °· Vomiting. °· Constipation. °· Diarrhea. °· Blood in your stool. °DIAGNOSIS  °Your health care provider will ask you about your medical history and do a physical exam. You may need to have tests done because many medical conditions can cause the same symptoms as diverticulitis. Tests may include: °· Blood tests. °· Urine tests. °· Imaging tests of the abdomen, including X-rays and CT scans. °When your condition is under control, your health care provider may recommend that you have a colonoscopy. A colonoscopy can show how severe your diverticula are and whether something else is causing your symptoms. °TREATMENT  °Most cases of diverticulitis are mild and can be treated at home. Treatment may include: °· Taking over-the-counter pain medicines. °· Following a clear liquid diet. °· Taking antibiotic medicines by mouth for 7-10 days. °More severe cases may  be treated at a hospital. Treatment may include: °· Not eating or drinking. °· Taking prescription pain medicine. °· Receiving antibiotic medicines through an IV tube. °· Receiving fluids and nutrition through an IV tube. °· Surgery. °HOME CARE INSTRUCTIONS  °· Follow your health care provider's instructions carefully. °· Follow a full liquid diet or other diet as directed by your health care provider. After your symptoms improve, your health care provider may tell you to change your diet. He or she may recommend you eat a high-fiber diet. Fruits and vegetables are good sources of fiber. Fiber makes it easier to pass stool. °· Take fiber supplements or probiotics as directed by your health care provider. °· Only take medicines as directed by your health care provider. °· Keep all your follow-up appointments. °SEEK MEDICAL CARE IF:  °· Your pain does not improve. °· You have a hard time eating food. °· Your bowel movements do not return to normal. °SEEK IMMEDIATE MEDICAL CARE IF:  °· Your pain becomes worse. °· Your symptoms do not get better. °· Your symptoms suddenly get worse. °· You have a fever. °· You have repeated vomiting. °· You have bloody or black, tarry stools. °MAKE SURE YOU:  °· Understand these instructions. °· Will watch your condition. °· Will get help right away if you are not doing well or get worse. °  °This information is not intended to replace advice given to you by your health care provider. Make sure you discuss any questions you have with your health care provider. °  °Document Released: 11/08/2004 Document Revised: 02/03/2013 Document Reviewed: 12/24/2012 °Elsevier Interactive Patient Education ©2016 [employer] Inc[employer] ° °

## 2015-06-27 NOTE — Progress Notes (Signed)
Patient ID: [name] Heritagehomas E Mcmahill, male   DOB: 05/20/1945, 70 y.o.   MRN: [id number]   Patient: [name] [name] Male    DOB: 01/12/1946   70 y.o.   MRN: [id number] Visit Date: 06/27/2015  Today's Provider: Dortha Kernennis [name], PA   Chief Complaint  Patient presents with  . Abdominal Pain   Subjective:    Abdominal Pain This is a new problem. Episode onset: 3 days ago. The onset quality is sudden. The problem occurs constantly. The problem has been unchanged. The pain is located in the LLQ. The quality of the pain is dull. The pain is aggravated by movement and palpation. abdominal aortic aneurysm    Past Medical History  Diagnosis Date  . Abdominal aortic aneurysm (HCC)   . Popliteal aneurysm (HCC)   . Hyperlipidemia   . Hypertension   . Coronary artery disease   . Detached retina   . Myocardial infarction [facility](HCC)     2007 heart stent  . Chronic kidney disease     Right kidney stone   Past Surgical History  Procedure Laterality Date  . Coronary stenting      2007  . Abdominal aortic aneurysm repair  11/24/09    EVAR  . Popliteal artery stent  11/2009    left popliteal artery   . Retinal detachment surgery Right 2011  . Tooth extraction  June 2015    Wisdom Tooth Extraction  . Femoral-popliteal bypass graft Left 10/29/2013    Procedure: BYPASS GRAFT FEMORAL-POPLITEAL ARTERY;  Surgeon: Nada LibmanVance W Brabham, MD;  Location: [facility] OR;  Service: Vascular;  Laterality: Left;  Left Femoral to Posterior Tibial artery bypass graft using nonreversed saphenous vein. and thrombectomy of Tibial arterly.  . Femoral-tibial bypass graft Left 10/30/2013    Procedure: Reexploration of Femoral-Posterior Tibial Bypass; Thrombectomy of Bypass; Left Leg Angiogram; Posterior Tibial Vein Patch Angioplasty;  Surgeon: Nada LibmanVance W Brabham, MD;  [facility];  Service: Vascular;  Laterality: Left;  Marland Kitchen. Eye surgery Right     cataract removed  . Colonoscopy    . Amputation Left 12/10/2013    Procedure: AMPUTATION LEFT   FOURTH AND FIFTH TOES;  Surgeon: Nada LibmanVance W Brabham, MD;  [facility];  Service: Vascular;  Laterality: Left;  . Lower extremity angiogram N/A 05/25/2014    Procedure: LOWER EXTREMITY ANGIOGRAM;  Surgeon: Nada LibmanVance W Brabham, MD;  [facility];  Service: Cardiovascular;  Laterality: N/A;    Previous Medications   ATORVASTATIN (LIPITOR) 40 MG TABLET    TAKE ONE TABLET BY MOUTH ONCE DAILY **NEED  OFFICE  VISIT**   METOPROLOL TARTRATE (LOPRESSOR) 25 MG TABLET    TAKE ONE TABLET BY MOUTH TWICE DAILY   MULTIPLE VITAMIN (MULTIVITAMIN WITH MINERALS) TABS TABLET    Take 1 tablet by mouth daily.   OMEGA-3 FATTY ACIDS (FISH OIL TRIPLE STRENGTH) 1400 MG CAPS    Take 2,800 mg by mouth 2 (two) times daily.   SAW PALMETTO 450 MG CAPS    Take 900 mg by mouth 2 (two) times daily.   WARFARIN (COUMADIN) 5 MG TABLET    TAKE ONE & ONE-HALF TABLETS BY MOUTH ONCE DAILY AS DIRECTED   Allergies  Allergen Reactions  . Omnipaque [Iohexol] Rash    Pt requires full premeds and needs to bring a driver.  Broke out in rash on back and arms.  Ok w/ premeds  . Oxytetracycline Rash    Terramycin causes rash  . Alteplase Other (See Comments)    Internal  bleeding, per RN in short stay. Patient requested this to be added to his allergy list    Review of Systems  Constitutional: Negative.   HENT: Negative.   Eyes: Negative.   Respiratory: Negative.   Cardiovascular: Negative.   Gastrointestinal: Positive for abdominal pain.  Endocrine: Negative.   Genitourinary: Negative.   Musculoskeletal: Negative.   Skin: Negative.   Allergic/Immunologic: Negative.   Neurological: Negative.   Hematological: Negative.   Psychiatric/Behavioral: Negative.     Social History  Substance Use Topics  . Smoking status: Former Smoker -- 15 years    Types: Cigarettes    Quit date: 09/21/1985  . Smokeless tobacco: Current User    Types: Chew  . Alcohol Use: No   Objective:   BP 112/76 mmHg  Pulse 62  Temp(Src) 98.2 F  (36.8 C) (Oral)  Resp 14  Wt 217 lb (98.431 kg)  Physical Exam  Constitutional: He is oriented to person, place, and time. He appears well-developed and well-nourished. No distress.  HENT:  Head: Normocephalic and atraumatic.  Right Ear: Hearing normal.  Left Ear: Hearing normal.  Nose: Nose normal.  Eyes: Conjunctivae and lids are normal. Right eye exhibits no discharge. Left eye exhibits no discharge. No scleral icterus.  Neck: Neck supple.  Cardiovascular: Normal rate and regular rhythm.   Pulmonary/Chest: Effort normal and breath sounds normal. No respiratory distress.  Abdominal: Soft. Bowel sounds are normal. He exhibits no mass. There is tenderness. There is no rebound and no guarding.  LLQ to palpation. No bruits today.  Musculoskeletal: Normal range of motion.  Neurological: He is alert and oriented to person, place, and time.  Skin: Skin is intact. No lesion and no rash noted.  Psychiatric: He has a normal mood and affect. His speech is normal and behavior is normal. Thought content normal.      Assessment & Plan:     1. LLQ abdominal pain Developed dull aching pain in the left lower abdomen 3 days ago. No fever but very concerned with history of AAA and EVAR in 2013. Denies nausea, vomiting, diarrhea, hematochezia, melena, hematuria, dysuria or known abdominal injury. Had colonoscopy 02-15-2006 with diverticula in sigmoid colon. Will schedule for CT scan with contrast and given Cipro 500 mg BID. Recommend bland diet with low residue and increase in fluid intake. Check labs and follow up pending reports  - CBC with Differential/Platelet - Comprehensive metabolic panel - CT Abdomen Pelvis W Contrast  2. Diverticulosis of large intestine without hemorrhage History of diverticulosis in sigmoid colon found during colonoscopy by Dr. Lemar Livings on 02-15-06. Will get CT scan of abdomen/pelvis with contrast to rule out acute diverticulitis. - CT Abdomen Pelvis W Contrast  3. History of  AAA (abdominal aortic aneurysm) repair Had EVAR in 2013 and concerned about dissection or leakage with recent LLQ abdominal pain. Will evaluate with CT scan. - CT Abdomen Pelvis W Contrast  4. Contrast media allergy History of reaction to contrast during during use of contrast media for arterial occlusive disease evaluation that finally resulted in amputation of 4th and 5th toes of the left foot in 2015. Reaction was some hives that responded well to pretreatment with Prednisone 50 mg 13, 7 and 1 hour prior to test with 50 mg Benadryl 1 hour prior to test. Will medicate again prior to test. - diphenhydrAMINE (BENADRYL) 50 MG tablet; One tablet by mouth 1 hour prior to CT scan with contrast.  Dispense: 1 tablet; Refill: 0 - predniSONE (DELTASONE) 10 MG  tablet; Take 5 tablets by mouth every six hours starting 13 hours prior to CT scan with contrast.  Dispense: 15 tablet; Refill: 0

## 2015-06-28 ENCOUNTER — Encounter: Payer: Self-pay | Admitting: *Deleted

## 2015-06-28 ENCOUNTER — Telehealth: Payer: Self-pay

## 2015-06-28 LAB — COMPREHENSIVE METABOLIC PANEL
A/G RATIO: 1.6 (ref 1.2–2.2)
ALBUMIN: 4.3 g/dL (ref 3.5–4.8)
ALK PHOS: 75 IU/L (ref 39–117)
ALT: 29 IU/L (ref 0–44)
AST: 30 IU/L (ref 0–40)
BUN / CREAT RATIO: 13 (ref 10–24)
BUN: 14 mg/dL (ref 8–27)
Bilirubin Total: 1.8 mg/dL — ABNORMAL HIGH (ref 0.0–1.2)
CHLORIDE: 103 mmol/L (ref 96–106)
CO2: 23 mmol/L (ref 18–29)
CREATININE: 1.05 mg/dL (ref 0.76–1.27)
Calcium: 9.6 mg/dL (ref 8.6–10.2)
GFR calc non Af Amer: 71 mL/min/{1.73_m2} (ref >59)
GFR, EST AFRICAN AMERICAN: 82 mL/min/{1.73_m2} (ref >59)
GLOBULIN, TOTAL: 2.7 g/dL (ref 1.5–4.5)
Glucose: 100 mg/dL — ABNORMAL HIGH (ref 65–99)
POTASSIUM: 4.5 mmol/L (ref 3.5–5.2)
SODIUM: 142 mmol/L (ref 134–144)
Total Protein: 7 g/dL (ref 6.0–8.5)

## 2015-06-28 LAB — CBC WITH DIFFERENTIAL/PLATELET
BASOS: 0 %
Basophils Absolute: 0 10*3/uL (ref 0.0–0.2)
EOS (ABSOLUTE): 0.3 10*3/uL (ref 0.0–0.4)
EOS: 4 %
HEMATOCRIT: 47.6 % (ref 37.5–51.0)
HEMOGLOBIN: 15.9 g/dL (ref 12.6–17.7)
Immature Grans (Abs): 0 10*3/uL (ref 0.0–0.1)
Immature Granulocytes: 0 %
LYMPHS ABS: 1.8 10*3/uL (ref 0.7–3.1)
Lymphs: 23 %
MCH: 30.8 pg (ref 26.6–33.0)
MCHC: 33.4 g/dL (ref 31.5–35.7)
MCV: 92 fL (ref 79–97)
MONOCYTES: 8 %
Monocytes Absolute: 0.6 10*3/uL (ref 0.1–0.9)
NEUTROS ABS: 4.9 10*3/uL (ref 1.4–7.0)
Neutrophils: 65 %
Platelets: 235 10*3/uL (ref 150–379)
RBC: 5.17 x10E6/uL (ref 4.14–5.80)
RDW: 14.1 % (ref 12.3–15.4)
WBC: 7.7 10*3/uL (ref 3.4–10.8)

## 2015-06-28 MED ORDER — PREDNISONE 10 MG PO TABS: ORAL_TABLET | ORAL | Status: DC

## 2015-06-28 MED ORDER — DIPHENHYDRAMINE HCL 50 MG PO TABS: ORAL_TABLET | ORAL | Status: DC

## 2015-06-28 NOTE — Telephone Encounter (Signed)
-----   Message from Tamsen Roersennis E [name], [place] sent at 06/28/2015 11:04 AM EDT ----- All blood tests normal. Proceed with CT scan of abdomen. Continue antibiotic for now.

## 2015-06-28 NOTE — Telephone Encounter (Signed)
Pt has called wanting to know if we heard from his vascular surgeon today about test being ordered.  please let him know so he can know when the test have been coordinated.  Call back is [phone]  [facility]

## 2015-06-28 NOTE — Telephone Encounter (Signed)
Patient advised as directed below. 

## 2015-06-28 NOTE — Telephone Encounter (Signed)
Per Maurine Ministerennis advised patient of premedication regimen prior to CT scan. RX has been sent to pharmacy.   Patient will take Prednisone- 50 mg by mouth at 13 hours, 7 hours, and 1 hour before contrast media injection, plus Benadryl 50 mg 1 hour before contrast.   Patient advised to stop Coumadin day before this regimen, and restart day after CT is done.  Patient verbalized understanding.

## 2015-06-28 NOTE — Progress Notes (Signed)
Mr. [name] [name] called to ask me to coordinate with [facility](Dennis St. Josephhrismon, [place]) regarding his CTA that they are wanting to order to investigate LLQ pain, AAA check and diverticulosis.  Pt had EVAR in 2011 by Dr. Myra GianottiBrabham and is scheduled for his yearly check, with AAA duplex scan here in the office, in July. I looked at the order and it is currently scheduled as a CTA chest.   I called over to Mr. [name]'s office and they made him aware that this CTA needed to be changed to CTA Abd/ pelvis. I also reminded them that the patient requires premedication prior to CTAs.  Mr. Shella SpearingChrismon will correct this order and make sure the patient has premeds. I will call the patient and let him know that we have approved of this CTA and want him to proceed. PA will also make sure we get a copy of the CTA results for Dr. Myra GianottiBrabham.

## 2015-06-28 NOTE — Telephone Encounter (Signed)
Spoke with Jola BabinskiMarilyn at Dr. Marnee SpringBradham's office. Per Jola BabinskiMarilyn Dr. Earle GellBradham will not be ordering a CT this year as they alternate years.  Maurine MinisterDennis was advised. Maurine MinisterDennis will put correct order for CT abdomen/pelvis in chart and send RX to premedicate patient prior to procedure.   Jola BabinskiMarilyn states she will contact patient to advise him referral is in progress.

## 2015-06-28 NOTE — Progress Notes (Signed)
I spoke to Mr. [name] [name] and he is aware that [facility] will be contacting him regarding the date of his CTA abd/pelvis. He is also aware that he will need 13 hour premedication as per Dortha Kernennis [name], PA orders.

## 2015-07-01 ENCOUNTER — Ambulatory Visit (INDEPENDENT_AMBULATORY_CARE_PROVIDER_SITE_OTHER): Payer: Medicare Other

## 2015-07-01 DIAGNOSIS — I82409 Acute embolism and thrombosis of unspecified deep veins of unspecified lower extremity: Secondary | ICD-10-CM

## 2015-07-01 LAB — POCT INR
INR: 3.2
PT: 39

## 2015-07-01 NOTE — Patient Instructions (Signed)
Anticoagulation Dose Instructions as of 07/01/2015      [name] SmilesSun Mon Tue Wed Thu Fri Sat   New Dose 10 mg 7.5 mg 10 mg 7.5 mg 10 mg 7.5 mg 10 mg    Description        10 mg daily except 7.5 mg M-W-F F/U in 2 weeks

## 2015-07-05 ENCOUNTER — Ambulatory Visit: Payer: Medicare Other

## 2015-07-05 ENCOUNTER — Ambulatory Visit
Admission: RE | Admit: 2015-07-05 | Discharge: 2015-07-05 | Disposition: A | Discharge status: Home / Self Care | Payer: Medicare Other | Source: Ambulatory Visit | Attending: Family Medicine | Admitting: Family Medicine

## 2015-07-05 DIAGNOSIS — I2510 Atherosclerotic heart disease of native coronary artery without angina pectoris: Secondary | ICD-10-CM | POA: Insufficient documentation

## 2015-07-05 DIAGNOSIS — Z9889 Other specified postprocedural states: Secondary | ICD-10-CM | POA: Insufficient documentation

## 2015-07-05 DIAGNOSIS — K7689 Other specified diseases of liver: Secondary | ICD-10-CM | POA: Insufficient documentation

## 2015-07-05 DIAGNOSIS — R1032 Left lower quadrant pain: Secondary | ICD-10-CM | POA: Insufficient documentation

## 2015-07-05 DIAGNOSIS — K5730 Diverticulosis of large intestine without perforation or abscess without bleeding: Secondary | ICD-10-CM | POA: Insufficient documentation

## 2015-07-05 DIAGNOSIS — M47814 Spondylosis without myelopathy or radiculopathy, thoracic region: Secondary | ICD-10-CM | POA: Diagnosis not present

## 2015-07-05 DIAGNOSIS — Z95828 Presence of other vascular implants and grafts: Secondary | ICD-10-CM | POA: Diagnosis not present

## 2015-07-05 DIAGNOSIS — N400 Enlarged prostate without lower urinary tract symptoms: Secondary | ICD-10-CM | POA: Diagnosis not present

## 2015-07-05 MED ORDER — IOPAMIDOL (ISOVUE-300) INJECTION 61%
100.0000 mL | Freq: Once | INTRAVENOUS | Status: AC | PRN
  Administered 2015-07-05: 100 mL via INTRAVENOUS

## 2015-07-06 ENCOUNTER — Telehealth: Payer: Self-pay | Admitting: Family Medicine

## 2015-07-06 NOTE — Telephone Encounter (Signed)
Pt is calling for his CT abd that was done yesterday.  Also wants to know when he can start his coumidin back.    His call back is [phone] or [phone]  Thanks, Barth Kirksteri

## 2015-07-06 NOTE — Telephone Encounter (Signed)
Please advise results? 

## 2015-07-07 NOTE — Telephone Encounter (Signed)
-----   Message from Tamsen Roersennis E [name], [place] sent at 07/05/2015  5:45 PM EDT ----- No sign of diverticulitis but extensive diverticulosis in sigmoid colon. Gall stones noted again without inflammatory changes in gall bladder. Aortoiliac stent stable without evidence for leak. Be sure vascular surgeon gets to see these results. If persistent abdominal discomfort, should get referral to gastroenterologist.

## 2015-07-07 NOTE — Telephone Encounter (Signed)
May restart today at his usual dosage and recheck protime with INR in 2 weeks.

## 2015-07-07 NOTE — Telephone Encounter (Signed)
[name] [name], before I call patient, can he start back taking Coumadin? Please advise. Thanks!

## 2015-07-14 NOTE — Telephone Encounter (Signed)
Advised patient of results.  

## 2015-07-15 ENCOUNTER — Ambulatory Visit (INDEPENDENT_AMBULATORY_CARE_PROVIDER_SITE_OTHER): Payer: Medicare Other | Admitting: Emergency Medicine

## 2015-07-15 DIAGNOSIS — I82409 Acute embolism and thrombosis of unspecified deep veins of unspecified lower extremity: Secondary | ICD-10-CM | POA: Diagnosis not present

## 2015-07-15 LAB — POCT INR
INR: 3.6
PT: 42.6

## 2015-07-15 NOTE — Patient Instructions (Signed)
Anticoagulation Dose Instructions as of 07/15/2015      [name] SmilesSun Mon Tue Wed Thu Fri Sat   New Dose 10 mg 7.5 mg 10 mg 7.5 mg 10 mg 7.5 mg 10 mg    Description        Hold for 3 days then resume same schedule (10 mg daily except 7.5 mg M-W-F) F/U in 3 weeks

## 2015-07-20 ENCOUNTER — Other Ambulatory Visit: Payer: Self-pay | Admitting: Family Medicine

## 2015-07-24 ENCOUNTER — Other Ambulatory Visit: Payer: Self-pay | Admitting: Cardiovascular Disease

## 2015-08-05 ENCOUNTER — Ambulatory Visit (INDEPENDENT_AMBULATORY_CARE_PROVIDER_SITE_OTHER): Payer: Medicare Other

## 2015-08-05 DIAGNOSIS — I82409 Acute embolism and thrombosis of unspecified deep veins of unspecified lower extremity: Secondary | ICD-10-CM

## 2015-08-05 LAB — POCT INR
INR: 2.2
PT: 26.1

## 2015-08-05 NOTE — Patient Instructions (Signed)
Anticoagulation Dose Instructions as of 08/05/2015      [name] SmilesSun Mon Tue Wed Thu Fri Sat   New Dose 10 mg 7.5 mg 10 mg 7.5 mg 10 mg 7.5 mg 10 mg    Description        Hold for 3 days then resume same schedule (10 mg daily except 7.5 mg M-W-F) F/U in 3 weeks

## 2015-08-12 ENCOUNTER — Ambulatory Visit (INDEPENDENT_AMBULATORY_CARE_PROVIDER_SITE_OTHER): Payer: Medicare Other | Admitting: Family Medicine

## 2015-08-12 DIAGNOSIS — I82409 Acute embolism and thrombosis of unspecified deep veins of unspecified lower extremity: Secondary | ICD-10-CM

## 2015-08-12 LAB — POCT INR
INR: 2.5
PT: 29.8

## 2015-08-12 NOTE — Patient Instructions (Signed)
Anticoagulation Dose Instructions as of 08/12/2015      [name] Mon Tue Wed Thu Fri Sat   New Dose 10 mg 7.5 mg 10 mg 7.5 mg 10 mg 7.5 mg 10 mg    Description        10 mg daily except for 7.5 mg on M, W & F. F/U 2 week

## 2015-08-15 ENCOUNTER — Other Ambulatory Visit (HOSPITAL_COMMUNITY): Payer: Medicare Other

## 2015-08-15 ENCOUNTER — Ambulatory Visit: Payer: Medicare Other | Admitting: Family

## 2015-08-17 ENCOUNTER — Other Ambulatory Visit: Payer: Self-pay | Admitting: Family Medicine

## 2015-08-26 ENCOUNTER — Ambulatory Visit (INDEPENDENT_AMBULATORY_CARE_PROVIDER_SITE_OTHER): Payer: Medicare Other

## 2015-08-26 DIAGNOSIS — I82409 Acute embolism and thrombosis of unspecified deep veins of unspecified lower extremity: Secondary | ICD-10-CM | POA: Diagnosis not present

## 2015-08-26 LAB — POCT INR
INR: 3.1
PT: 37.5

## 2015-08-26 NOTE — Patient Instructions (Signed)
Anticoagulation Dose Instructions as of 08/26/2015      [name] SmilesSun Mon Tue Wed Thu Fri Sat   New Dose 20 mg 7.5 mg 20 mg 7.5 mg 20 mg 7.5 mg 20 mg    Description        10 mg daily except for 7.5 mg on M, W & F. F/U 4 week

## 2015-09-02 ENCOUNTER — Ambulatory Visit: Payer: Self-pay

## 2015-09-21 ENCOUNTER — Telehealth: Payer: Self-pay | Admitting: Family Medicine

## 2015-09-21 NOTE — Telephone Encounter (Signed)
Pt stated that he was returning Elena's call. Thanks TNP

## 2015-09-22 ENCOUNTER — Encounter: Payer: Self-pay | Admitting: Family

## 2015-09-22 ENCOUNTER — Other Ambulatory Visit: Payer: Self-pay | Admitting: Cardiovascular Disease

## 2015-09-23 ENCOUNTER — Ambulatory Visit (INDEPENDENT_AMBULATORY_CARE_PROVIDER_SITE_OTHER): Payer: Medicare Other

## 2015-09-23 DIAGNOSIS — I82409 Acute embolism and thrombosis of unspecified deep veins of unspecified lower extremity: Secondary | ICD-10-CM | POA: Diagnosis not present

## 2015-09-23 LAB — POCT INR
INR: 2.9
PT: 34.9

## 2015-09-23 NOTE — Telephone Encounter (Signed)
Patient advised as below.  

## 2015-09-23 NOTE — Telephone Encounter (Signed)
-----   Message from Tamsen Roersennis E [name], [place] sent at 09/23/2015  1:48 PM EDT ----- Good INR. Continue present dosage and recheck protime in a month.

## 2015-09-23 NOTE — Patient Instructions (Signed)
Anticoagulation Dose Instructions as of 09/23/2015      [name] Mon Tue Wed Thu Fri Sat   New Dose 10 mg 7.5 mg 10 mg 7.5 mg 10 mg 7.5 mg 10 mg    Description   10 mg daily except for 7.5 mg on M, W & F. F/U 4 week

## 2015-09-27 ENCOUNTER — Telehealth (HOSPITAL_COMMUNITY): Payer: Self-pay | Admitting: Surgery

## 2015-09-27 NOTE — Telephone Encounter (Signed)
-----   Message from Sharee PimpleMarilyn K McChesney, RN sent at 09/27/2015 10:30 AM EDT ----- Regarding: RE: Re: Vascular Lab Appts for 08/16 at 9am He doesn't need it, graft is stable with no endoleak. Done as a CT because he is allergic to contrast and they were looking at GI issues but still graft isOK per Dr. Alfredo BattyMattern ----- Message ----- From: Rudean HaskellMelinda M Reaves Sent: 09/27/2015   9:52 AM To: [facility] Subject: Re: Vascular Lab Appts for 08/16 at 9am        Pt is questioning why the EVAR is needed if he just had the CT in May 2017?    He is scheduled at 9am tomorrow.   Please let me know ASAP so I can let the pt know what time to arrive and whether to be fasting or not.  Juliette AlcideMelinda

## 2015-09-28 ENCOUNTER — Ambulatory Visit (INDEPENDENT_AMBULATORY_CARE_PROVIDER_SITE_OTHER)
Admission: RE | Admit: 2015-09-28 | Discharge: 2015-09-28 | Disposition: A | Discharge status: Home / Self Care | Payer: Medicare Other | Source: Ambulatory Visit | Attending: Surgery | Admitting: Surgery

## 2015-09-28 ENCOUNTER — Ambulatory Visit (INDEPENDENT_AMBULATORY_CARE_PROVIDER_SITE_OTHER): Payer: Medicare Other | Admitting: Family

## 2015-09-28 ENCOUNTER — Telehealth: Payer: Self-pay | Admitting: Family Medicine

## 2015-09-28 ENCOUNTER — Encounter: Payer: Self-pay | Admitting: Family

## 2015-09-28 ENCOUNTER — Ambulatory Visit (HOSPITAL_COMMUNITY)
Admission: RE | Admit: 2015-09-28 | Discharge: 2015-09-28 | Disposition: A | Discharge status: Home / Self Care | Payer: Medicare Other | Source: Ambulatory Visit | Attending: Surgery | Admitting: Surgery

## 2015-09-28 ENCOUNTER — Other Ambulatory Visit: Payer: Self-pay

## 2015-09-28 VITALS — BP 107/64 | HR 57 | Temp 97.6°F | Resp 18 | Ht 74.0 in | Wt 211.0 lb

## 2015-09-28 DIAGNOSIS — Z720 Tobacco use: Secondary | ICD-10-CM | POA: Diagnosis not present

## 2015-09-28 DIAGNOSIS — I724 Aneurysm of artery of lower extremity: Secondary | ICD-10-CM

## 2015-09-28 DIAGNOSIS — Z87891 Personal history of nicotine dependence: Secondary | ICD-10-CM | POA: Diagnosis not present

## 2015-09-28 DIAGNOSIS — Z48812 Encounter for surgical aftercare following surgery on the circulatory system: Secondary | ICD-10-CM

## 2015-09-28 DIAGNOSIS — Z95828 Presence of other vascular implants and grafts: Secondary | ICD-10-CM | POA: Insufficient documentation

## 2015-09-28 DIAGNOSIS — Z4889 Encounter for other specified surgical aftercare: Secondary | ICD-10-CM

## 2015-09-28 DIAGNOSIS — I779 Disorder of arteries and arterioles, unspecified: Secondary | ICD-10-CM

## 2015-09-28 DIAGNOSIS — E785 Hyperlipidemia, unspecified: Secondary | ICD-10-CM | POA: Diagnosis not present

## 2015-09-28 DIAGNOSIS — I70202 Unspecified atherosclerosis of native arteries of extremities, left leg: Secondary | ICD-10-CM | POA: Insufficient documentation

## 2015-09-28 DIAGNOSIS — I6523 Occlusion and stenosis of bilateral carotid arteries: Secondary | ICD-10-CM | POA: Insufficient documentation

## 2015-09-28 DIAGNOSIS — I739 Peripheral vascular disease, unspecified: Secondary | ICD-10-CM

## 2015-09-28 DIAGNOSIS — I70409 Unspecified atherosclerosis of autologous vein bypass graft(s) of the extremities, unspecified extremity: Secondary | ICD-10-CM

## 2015-09-28 LAB — VAS US CAROTID
LCCADSYS: -121 cm/s
LCCAPDIAS: 22 cm/s
LCCAPSYS: 110 cm/s
LEFT ECA DIAS: -12 cm/s
LICADDIAS: -35 cm/s
LICADSYS: -92 cm/s
LICAPDIAS: 15 cm/s
LICAPSYS: 68 cm/s
Left CCA dist dias: -29 cm/s
RCCAPDIAS: 21 cm/s
RIGHT CCA MID DIAS: 25 cm/s
RIGHT ECA DIAS: -18 cm/s
Right CCA prox sys: 91 cm/s
Right cca dist sys: -72 cm/s

## 2015-09-28 NOTE — Patient Instructions (Signed)
Peripheral Vascular Disease Peripheral vascular disease (PVD) is a disease of the blood vessels that are not part of your heart and brain. A simple term for PVD is poor circulation. In most cases, PVD narrows the blood vessels that carry blood from your heart to the rest of your body. This can result in a decreased supply of blood to your arms, legs, and internal organs, like your stomach or kidneys. However, it most often affects a person's lower legs and feet. There are two types of PVD.  Organic PVD. This is the more common type. It is caused by damage to the structure of blood vessels.  Functional PVD. This is caused by conditions that make blood vessels contract and tighten (spasm). Without treatment, PVD tends to get worse over time. PVD can also lead to acute ischemic limb. This is when an arm or limb suddenly has trouble getting enough blood. This is a medical emergency. CAUSES Each type of PVD has many different causes. The most common cause of PVD is buildup of a fatty material (plaque) inside of your arteries (atherosclerosis). Small amounts of plaque can break off from the walls of the blood vessels and become lodged in a smaller artery. This blocks blood flow and can cause acute ischemic limb. Other common causes of PVD include:  Blood clots that form inside of blood vessels.  Injuries to blood vessels.  Diseases that cause inflammation of blood vessels or cause blood vessel spasms.  Health behaviors and health history that increase your risk of developing PVD. RISK FACTORS  You may have a greater risk of PVD if you:  Have a family history of PVD.  Have certain medical conditions, including:  High cholesterol.  Diabetes.  High blood pressure (hypertension).  Coronary heart disease.  Past problems with blood clots.  Past injury, such as burns or a broken bone. These may have damaged blood vessels in your limbs.  Buerger disease. This is caused by inflamed blood  vessels in your hands and feet.  Some forms of arthritis.  Rare birth defects that affect the arteries in your legs.  Use tobacco.  Do not get enough exercise.  Are obese.  Are age 50 or older. SIGNS AND SYMPTOMS  PVD may cause many different symptoms. Your symptoms depend on what part of your body is not getting enough blood. Some common signs and symptoms include:  Cramps in your lower legs. This may be a symptom of poor leg circulation (claudication).  Pain and weakness in your legs while you are physically active that goes away when you rest (intermittent claudication).  Leg pain when at rest.  Leg numbness, tingling, or weakness.  Coldness in a leg or foot, especially when compared with the other leg.  Skin or hair changes. These can include:  Hair loss.  Shiny skin.  Pale or bluish skin.  Thick toenails.  Inability to get or maintain an erection (erectile dysfunction). People with PVD are more prone to developing ulcers and sores on their toes, feet, or legs. These may take longer than normal to heal. DIAGNOSIS Your health care provider may diagnose PVD from your signs and symptoms. The health care provider will also do a physical exam. You may have tests to find out what is causing your PVD and determine its severity. Tests may include:  Blood pressure recordings from your arms and legs and measurements of the strength of your pulses (pulse volume recordings).  Imaging studies using sound waves to take pictures of   the blood flow through your blood vessels (Doppler ultrasound).  Injecting a dye into your blood vessels before having imaging studies using:  X-rays (angiogram or arteriogram).  Computer-generated X-rays (CT angiogram).  A powerful electromagnetic field and a computer (magnetic resonance angiogram or MRA). TREATMENT Treatment for PVD depends on the cause of your condition and the severity of your symptoms. It also depends on your age. Underlying  causes need to be treated and controlled. These include long-lasting (chronic) conditions, such as diabetes, high cholesterol, and high blood pressure. You may need to first try making lifestyle changes and taking medicines. Surgery may be needed if these do not work. Lifestyle changes may include:  Quitting smoking.  Exercising regularly.  Following a low-fat, low-cholesterol diet. Medicines may include:  Blood thinners to prevent blood clots.  Medicines to improve blood flow.  Medicines to improve your blood cholesterol levels. Surgical procedures may include:  A procedure that uses an inflated balloon to open a blocked artery and improve blood flow (angioplasty).  A procedure to put in a tube (stent) to keep a blocked artery open (stent implant).  Surgery to reroute blood flow around a blocked artery (peripheral bypass surgery).  Surgery to remove dead tissue from an infected wound on the affected limb.  Amputation. This is surgical removal of the affected limb. This may be necessary in cases of acute ischemic limb that are not improved through medical or surgical treatments. HOME CARE INSTRUCTIONS  Take medicines only as directed by your health care provider.  Do not use any tobacco products, including cigarettes, chewing tobacco, or electronic cigarettes. If you need help quitting, ask your health care provider.  Lose weight if you are overweight, and maintain a healthy weight as directed by your health care provider.  Eat a diet that is low in fat and cholesterol. If you need help, ask your health care provider.  Exercise regularly. Ask your health care provider to suggest some good activities for you.  Use compression stockings or other mechanical devices as directed by your health care provider.  Take good care of your feet.  Wear comfortable shoes that fit well.  Check your feet often for any cuts or sores. SEEK MEDICAL CARE IF:  You have cramps in your legs  while walking.  You have leg pain when you are at rest.  You have coldness in a leg or foot.  Your skin changes.  You have erectile dysfunction.  You have cuts or sores on your feet that are not healing. SEEK IMMEDIATE MEDICAL CARE IF:  Your arm or leg turns cold and blue.  Your arms or legs become red, warm, swollen, painful, or numb.  You have chest pain or trouble breathing.  You suddenly have weakness in your face, arm, or leg.  You become very confused or lose the ability to speak.  You suddenly have a very bad headache or lose your vision.   This information is not intended to replace advice given to you by your health care provider. Make sure you discuss any questions you have with your health care provider.   Document Released: 03/08/2004 Document Revised: 02/19/2014 Document Reviewed: 07/09/2013 Elsevier Interactive Patient Education 2016 [employer]     Smokeless Tobacco Use Smokeless tobacco is a loose, fine, or stringy tobacco. The tobacco is not smoked like a cigarette, but it is chewed or held in the lips or cheeks. It resembles tea and comes from the leaves of the tobacco plant. Smokeless tobacco is   usually flavored, sweetened, or processed in some way. Although smokeless tobacco is not smoked into the lungs, its chemicals are absorbed through the membranes in the mouth and into the bloodstream. Its chemicals are also swallowed in saliva. The chemicals (nicotine and other toxins) are known to cause cancer. Smokeless tobacco contains up to 28 differentcarcinogens. CAUSES Nicotine is addictive. Smokeless tobacco contains nicotine, which is a stimulant. This stimulant can give you a "buzz" or altered state. People can become addicted to the feeling it delivers.  SYMPTOMS Smokeless tobacco can cause health problems, including:  Bad breath.  Yellow-brown teeth.  Mouth sores.  Cracking and bleeding lips.  Gum disease, gum recession, and bone loss around  the teeth.  Tooth decay.  Increased or irregular heart rate.  High blood pressure, heart disease, and stroke.  Cancer of the mouth, lips, tongue, pancreas, voice box (larynx), esophagus, colon, and bladder.  Precancerous lesion of the soft tissues of the mouth (leukoplakia).  Loss of your sense of taste. TREATMENT Talk with your caregiver about ways you can quit. Quitting tobacco is a good decision for your health. Nicotine is addictive, but several options are available to help you quit including:  Nicotine replacement therapy (gum or patch).  Support and cessation programs. The following tips can help you quit:  Write down the reasons you would like to quit and look at them often.  Set a date during a low stress time to stop or cut back.  Ask family and friends for their support.  Remove all tobacco products from your home and work.  Replace the chewing tobacco with things like beef jerky, sunflower seeds, or shredded coconut.  Avoid situations that may make you want to chew tobacco.  Exercise and eat a healthy diet.  When you crave tobacco, distract yourself with drinking water, sugarless chewing gum, sugarless hard candy, exercising, or deep breathing. HOME CARE INSTRUCTIONS  See your dentist for regular oral health exams every 6 months.  Follow up with your caregiver as recommended. SEEK MEDICAL CARE OR DENTAL CARE IF:  You have bleeding or cracking lips, gums, or cheeks.  You have mouth sores, discolorations, or pain.  You have tooth pain.  You develop persistent irritation, burning, or sores in the mouth.  You have pain, tenderness, or numbness in the mouth.  You develop a lump, bumpy patch, or hardened skin inside the mouth.  The color changes inside your mouth (gray, white, or red spots).  You have difficulty chewing, swallowing, or speaking.   This information is not intended to replace advice given to you by your health care provider. Make  sure you discuss any questions you have with your health care provider.   Document Released: 07/03/2010 Document Revised: 04/23/2011 Document Reviewed: 07/03/2010 Elsevier Interactive Patient Education 2016 [employer]   

## 2015-09-28 NOTE — Telephone Encounter (Signed)
Ok to stop warfarin 5 days before surgery and restart immediately afterwords.

## 2015-09-28 NOTE — Telephone Encounter (Signed)
Jud[facility]Stephanie [facility] calling to get ok to stop pt's coumadin for his surgery next week. [facility] @ [phone].  Thanks CC

## 2015-09-28 NOTE — Telephone Encounter (Signed)
Being that I dont know this patient and with his vascular history I would recommend waiting for [name] [name] to answer in the morning or calling him today if they need immediate answer. Almost seems as he may need bridge with lovenox but again I have never seen him so I am not sure if he has done well with stopping warfarin previously.

## 2015-09-28 NOTE — Telephone Encounter (Signed)
Verbally advised Stephanie of Judeth Cornfieldorder. Allene DillonEmily Drozdowski, CMA

## 2015-09-28 NOTE — Progress Notes (Signed)
[facility] [place] HISTORY AND PHYSICAL   MRN : [id number]  History of Present Illness:   [name] [name] is a 70 y.o. male patient of Dr. Myra GianottiBrabham.  He is status post endovascular repair of abdominal aortic aneurysm in October of 2011. He has a known persistent type II endoleak from a patent lumbar artery, however the size of his aneurysm sac continues to decrease. 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 He is s/p amputation left 4th and 5th toes on 12/10/13, several attempts at revascularization until he had a left femoral popliteal BPG on 10/30/14.  He last saw Dr. Myra GianottiBrabham on 08/09/14 At that time No endoleak is identified. Maximum diameter is 5.0 cm. ABI was is 0.65 on the left (previously was 0.60). On the right it measured 0.84 The velocities in the left leg had significantly improved since his most recent intervention. He no longer had an open wound. Was to continue with close ultrasound surveillance. The next study was to be in 6 months. His aneurysm remained stable. He was to have a repeat ultrasound in one year.   Pt states he walks his a mile daily, he reports left calf "cramp" after walking 20-30 minutes, relieved with rest, this has not changed.  He denies abdominal or back pain.   He had an MI in 2007 with cardiac stent placement.  He denies any history of stroke or TIA.   Pt Diabetic: No  Pt smoker: former smoker, quit in 1988; uses oral tobacco since he quit smoking     Pt meds include: Statin :Yes Betablocker: Yes ASA: yes Other anticoagulants/antiplatelets: warfarin, taking for PAD     Current Outpatient Prescriptions  Medication Sig Dispense Refill  . atorvastatin (LIPITOR) 40 MG tablet Take 1 tablet (40 mg total) by mouth daily. Pt is overdue for an appointment. Please call and schedule for further refills or get from pcp 15 tablet 0  . diphenhydrAMINE (BENADRYL) 50  MG tablet One tablet by mouth 1 hour prior to CT scan with contrast. 1 tablet 0  . metoprolol tartrate (LOPRESSOR) 25 MG tablet TAKE ONE TABLET BY MOUTH TWICE DAILY 60 tablet 6  . Multiple Vitamin (MULTIVITAMIN WITH MINERALS) TABS tablet Take 1 tablet by mouth daily.    . Omega-3 Fatty Acids (FISH OIL TRIPLE STRENGTH) 1400 MG CAPS Take 2,800 mg by mouth 2 (two) times daily.    . Saw Palmetto 450 MG CAPS Take 900 mg by mouth 2 (two) times daily.    Marland Kitchen. warfarin (COUMADIN) 5 MG tablet Two tablets by mouth daily except 1 1/2 tablets on M,W,F. 50 tablet 3  . predniSONE (DELTASONE) 10 MG tablet Take 5 tablets by mouth every six hours starting 13 hours prior to CT scan with contrast. (Patient not taking: Reported on 09/28/2015) 15 tablet 0   No current facility-administered medications for this visit.     Past Medical History:  Diagnosis Date  . Abdominal aortic aneurysm (HCC)   . Chronic kidney disease    Right kidney stone  . Coronary artery disease   . Detached retina   . Hyperlipidemia   . Hypertension   . Myocardial infarction [facility](HCC)    2007 heart stent  . Popliteal aneurysm [facility](HCC)     Social History Social History  Substance Use Topics  . Smoking status: Former Smoker    Years: 15.00    Types: Cigarettes    Quit date: 09/21/1985  .  Smokeless tobacco: Current User    Types: Chew  . Alcohol use No    Family History Family History  Problem Relation Age of Onset  . Aneurysm Sister     possibly   . Peripheral vascular disease Brother     Surgical History Past Surgical History:  Procedure Laterality Date  . ABDOMINAL AORTIC ANEURYSM REPAIR  11/24/09   EVAR  . AMPUTATION Left 12/10/2013   Procedure: AMPUTATION LEFT  FOURTH AND FIFTH TOES;  Surgeon: Nada LibmanVance W Brabham, MD;  [facility];  Service: Vascular;  Laterality: Left;  . COLONOSCOPY    . coronary stenting     2007  . EYE SURGERY Right    cataract removed  . FEMORAL-POPLITEAL BYPASS GRAFT Left 10/29/2013   Procedure:  BYPASS GRAFT FEMORAL-POPLITEAL ARTERY;  Surgeon: Nada LibmanVance W Brabham, MD;  Location: [facility] OR;  Service: Vascular;  Laterality: Left;  Left Femoral to Posterior Tibial artery bypass graft using nonreversed saphenous vein. and thrombectomy of Tibial arterly.  . FEMORAL-TIBIAL BYPASS GRAFT Left 10/30/2013   Procedure: Reexploration of Femoral-Posterior Tibial Bypass; Thrombectomy of Bypass; Left Leg Angiogram; Posterior Tibial Vein Patch Angioplasty;  Surgeon: Nada LibmanVance W Brabham, MD;  Location: [facility] OR;  Service: Vascular;  Laterality: Left;  . LOWER EXTREMITY ANGIOGRAM N/A 05/25/2014   Procedure: LOWER EXTREMITY ANGIOGRAM;  Surgeon: Nada LibmanVance W Brabham, MD;  [facility];  Service: Cardiovascular;  Laterality: N/A;  . POPLITEAL ARTERY STENT  11/2009   left popliteal artery   . RETINAL DETACHMENT SURGERY Right 2011  . TOOTH EXTRACTION  June 2015   Wisdom Tooth Extraction    Allergies  Allergen Reactions  . Omnipaque [Iohexol] Rash    Pt requires full premeds and needs to bring a driver.  Broke out in rash on back and arms.  Ok w/ premeds  . Oxytetracycline Rash    Terramycin causes rash  . Alteplase Other (See Comments)    Internal bleeding, per RN in short stay. Patient requested this to be added to his allergy list    Current Outpatient Prescriptions  Medication Sig Dispense Refill  . atorvastatin (LIPITOR) 40 MG tablet Take 1 tablet (40 mg total) by mouth daily. Pt is overdue for an appointment. Please call and schedule for further refills or get from pcp 15 tablet 0  . diphenhydrAMINE (BENADRYL) 50 MG tablet One tablet by mouth 1 hour prior to CT scan with contrast. 1 tablet 0  . metoprolol tartrate (LOPRESSOR) 25 MG tablet TAKE ONE TABLET BY MOUTH TWICE DAILY 60 tablet 6  . Multiple Vitamin (MULTIVITAMIN WITH MINERALS) TABS tablet Take 1 tablet by mouth daily.    . Omega-3 Fatty Acids (FISH OIL TRIPLE STRENGTH) 1400 MG CAPS Take 2,800 mg by mouth 2 (two) times daily.    . Saw Palmetto 450 MG  CAPS Take 900 mg by mouth 2 (two) times daily.    Marland Kitchen. warfarin (COUMADIN) 5 MG tablet Two tablets by mouth daily except 1 1/2 tablets on M,W,F. 50 tablet 3  . predniSONE (DELTASONE) 10 MG tablet Take 5 tablets by mouth every six hours starting 13 hours prior to CT scan with contrast. (Patient not taking: Reported on 09/28/2015) 15 tablet 0   No current facility-administered medications for this visit.      REVIEW OF SYSTEMS: See HPI for pertinent positives and negatives.  Physical Examination Vitals:   09/28/15 1049  BP: 107/64  Pulse: (!) 57  Resp: 18  Temp: 97.6 F (36.4 C)  SpO2: 99%  Weight: 211 lb (95.7 kg)  Height: 6\' 2"  (1.88 m)   Body mass index is 27.09 kg/m.  General: A&O x 3, WD male  Pulmonary: Respirations are non labored, no cough. Cardiac: RRR  Vascular: Extremities: No gangrene, no open wounds. Left 4th and 5th toes are surgically absent.  Vessel  Right  Left   Radial  2+Palpable  2+Palpable   Carotid  without bruit  without bruit   Aorta  Not palpable  N/A   Femoral  2+Palpable  2+Palpable   Popliteal  Not palpable  Not palpable   PT   audible by Doppler  audible by Doppler  DP  audible by Doppler   audible by Doppler    Gastrointestinal: soft, NTND, -G/R, - HSM, - palpable masses, - CVAT B.  Musculoskeletal: M/S 5/5 throughout, see Vascular.  Neurologic: CN 2-12 grossly intact, Motor exam as listed above   07/05/15 CTA abd/pevis to evaluate LLQ abdominal pain, hx divericulsosis: Aortoiliac stent graft is stable. There is no evidence for leak. The aneurysm is unchanged. Calcifications are present within the iliac arteries bilaterally. No significant adenopathy is present.   Non-Invasive Vascular Imaging (09/28/15):   CEREBROVASCULAR DUPLEX EVALUATION    INDICATION: Peripheral arterial disease    PREVIOUS INTERVENTION(S): Left femoral to proximal posterior tibial artery bypass graft of 10/29/13 with thrombectomy on  10/31/14; history of left popliteal stent repair in 2011 with subsequent occlusion; EVAR in 2011    DUPLEX EXAM:     RIGHT  LEFT  Peak Systolic Velocities (cm/s) End Diastolic Velocities (cm/s) Plaque LOCATION Peak Systolic Velocities (cm/s) End Diastolic Velocities (cm/s) Plaque  91 21  CCA PROXIMAL 110 22   113 25  CCA MID 129 30   101 25  CCA DISTAL 121 29 HT  106 18  ECA 103 12   101 28 HT ICA PROXIMAL 68 15 CP     ICA MID 72 26   72 23  ICA DISTAL 92 35      ICA / CCA Ratio (PSV)   Antegrade Vertebral Flow Antegrade   Brachial Systolic Pressure (mmHg)   Triphasic Subclavian Artery Waveforms Triphasic    Plaque Morphology:  HM = Homogeneous, HT = Heterogeneous, CP = Calcific Plaque, SP = Smooth Plaque, IP = Irregular Plaque     ADDITIONAL FINDINGS:      1. Doppler velocities suggest a 1-39% right proximal internal carotid artery stenosis 2. Doppler velocities suggest a 1-39% left proximal internal carotid artery stenosis.    Compared to the previous exam:  No previous exam for comparison.     LOWER EXTREMITY ARTERIAL DUPLEX EVALUATION    INDICATION: Follow up bypass graft     PREVIOUS INTERVENTION(S): Left femoral to proximal posterior tibial artery bypass graft of 10/29/13 with thrombectomy on 10/31/14; history of left popliteal stent repair in 2011 with subsequent occlusion; EVAR in 2011    DUPLEX EXAM:     RIGHT  LEFT   Peak Systolic Velocity (cm/s) Ratio (if abnormal) Waveform  Peak Systolic Velocity (cm/s) Ratio (if abnormal) Waveform     Inflow Artery 74       Proximal Anastomosis 80       Proximal Graft 55       Mid Graft 29        Distal Graft 50>266>94 5.32      Distal Anastomosis 63       Outflow Artery 29    0.91/0.59 Today's ABI / TBI 0.47/0  0.8/0.66 Previous ABI /  TBI (03/29/15) 0.6/0.21    Waveform:    M - Monophasic       B - Biphasic       T - Triphasic  If Ankle Brachial Index (ABI) or Toe Brachial Index (TBI) performed, please see complete report      ADDITIONAL FINDINGS:     IMPRESSION: Patent left leg bypass graft with >70% stenosis noted below the knee.      Compared to the previous exam:  Stenosis is new since last study of 03/29/15.     ASSESSMENT:  [name] [name] is a 70 y.o. male who is status post endovascular repair of abdominal aortic aneurysm in October of 2011. He has a known persistent type II endoleak from a patent lumbar artery, however the size of his aneurysm sac continues to decrease. 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  He is s/p amputation left 4th and 5th toes on 12/10/13, several attempts at revascularization until he had a left femoral popliteal BPG on 10/30/14.  He has mild claudication in his left calf, no signs of ischemia in his feet/legs, this has not changed since his last visit.  Carotid duplex today suggests minimal bilateral ICA stenoses. He has no history of stroke or TIA.   S/P EVAR in 2011 - He had a CTA abd/pelvis in May 2017 requested by another provider to evaluate LLQ abdominal pain, hx diverticulosis - This indicated aortoiliac stent graft is stable. There is no evidence for leak. The aneurysm sac size is unchanged. Calcifications are present within the iliac arteries bilaterally.  Today's left LE arterial duplex suggests left leg bypass graft with >70% stenosis noted below the knee. Stenosis is new since last study of 03/29/15. Right ABI improved from 80% to 91% arterial perfusion, but left ABI decreased from 60% to 47%, a 22% decrease.  His atherosclerotic risk factors include prior smoking, current use of smokeless tobacco, and CAD.  Creatinine was 1.05 in May, 2017.  Face to face time with patient was 25 minutes. Over 50% of this time was spent on counseling and coordination of care.  PLAN:   The patient was counseled re smokeless tobacco cessation and given several free resources re tobacco cessation.  Based on today's  exam and non-invasive vascular lab results, and after discussing with Dr. Edilia Bo, the patient will be scheduled within the next 4 weeks for an arteriogram with bilateral runoff, possible left LE intervention by Dr. Myra Gianotti.  I discussed in depth with the patient the nature of atherosclerosis, and emphasized the importance of maximal medical management including strict control of blood pressure, blood glucose, and lipid levels, obtaining regular exercise, and cessation of smoking.  The patient is aware that without maximal medical management the underlying atherosclerotic disease process will progress, limiting the benefit of any interventions.  The patient was given information about stroke prevention and what symptoms should prompt the patient to seek immediate medical care.  The patient was given information about PAD including signs, symptoms, treatment, what symptoms should prompt the patient to seek immediate medical care, and risk reduction measures to take. Thank you for allowing [place] to participate in this patient's care.  Charisse March, RN, MSN, FNP-C [facility] Office: [phone]  Clinic MD: Edilia Bo 09/28/2015 11:22 AM

## 2015-10-03 ENCOUNTER — Other Ambulatory Visit: Payer: Self-pay

## 2015-10-04 ENCOUNTER — Encounter (HOSPITAL_COMMUNITY)
Admission: RE | Disposition: A | Discharge status: Home / Self Care | Payer: Self-pay | Source: Ambulatory Visit | Attending: Surgery

## 2015-10-04 ENCOUNTER — Encounter (HOSPITAL_COMMUNITY): Payer: Self-pay | Admitting: Surgery

## 2015-10-04 ENCOUNTER — Ambulatory Visit (HOSPITAL_COMMUNITY)
Admission: RE | Admit: 2015-10-04 | Discharge: 2015-10-04 | Disposition: A | Discharge status: Home / Self Care | Payer: Medicare Other | Source: Ambulatory Visit | Attending: Surgery | Admitting: Surgery

## 2015-10-04 DIAGNOSIS — Z955 Presence of coronary angioplasty implant and graft: Secondary | ICD-10-CM | POA: Diagnosis not present

## 2015-10-04 DIAGNOSIS — I129 Hypertensive chronic kidney disease with stage 1 through stage 4 chronic kidney disease, or unspecified chronic kidney disease: Secondary | ICD-10-CM | POA: Diagnosis not present

## 2015-10-04 DIAGNOSIS — Z89422 Acquired absence of other left toe(s): Secondary | ICD-10-CM | POA: Diagnosis not present

## 2015-10-04 DIAGNOSIS — Y812 Prosthetic and other implants, materials and accessory general- and plastic-surgery devices associated with adverse incidents: Secondary | ICD-10-CM | POA: Insufficient documentation

## 2015-10-04 DIAGNOSIS — E785 Hyperlipidemia, unspecified: Secondary | ICD-10-CM | POA: Diagnosis not present

## 2015-10-04 DIAGNOSIS — I714 Abdominal aortic aneurysm, without rupture: Secondary | ICD-10-CM | POA: Diagnosis not present

## 2015-10-04 DIAGNOSIS — Z7901 Long term (current) use of anticoagulants: Secondary | ICD-10-CM | POA: Insufficient documentation

## 2015-10-04 DIAGNOSIS — F17220 Nicotine dependence, chewing tobacco, uncomplicated: Secondary | ICD-10-CM | POA: Diagnosis not present

## 2015-10-04 DIAGNOSIS — I2510 Atherosclerotic heart disease of native coronary artery without angina pectoris: Secondary | ICD-10-CM | POA: Diagnosis not present

## 2015-10-04 DIAGNOSIS — N189 Chronic kidney disease, unspecified: Secondary | ICD-10-CM | POA: Insufficient documentation

## 2015-10-04 DIAGNOSIS — T82856A Stenosis of peripheral vascular stent, initial encounter: Secondary | ICD-10-CM | POA: Insufficient documentation

## 2015-10-04 DIAGNOSIS — I252 Old myocardial infarction: Secondary | ICD-10-CM | POA: Diagnosis not present

## 2015-10-04 DIAGNOSIS — I70212 Atherosclerosis of native arteries of extremities with intermittent claudication, left leg: Secondary | ICD-10-CM | POA: Diagnosis not present

## 2015-10-04 DIAGNOSIS — Z87442 Personal history of urinary calculi: Secondary | ICD-10-CM | POA: Diagnosis not present

## 2015-10-04 DIAGNOSIS — Z7952 Long term (current) use of systemic steroids: Secondary | ICD-10-CM | POA: Insufficient documentation

## 2015-10-04 DIAGNOSIS — T82858A Stenosis of vascular prosthetic devices, implants and grafts, initial encounter: Secondary | ICD-10-CM | POA: Diagnosis not present

## 2015-10-04 HISTORY — PX: PERIPHERAL VASCULAR CATHETERIZATION: SHX172C

## 2015-10-04 LAB — POCT ACTIVATED CLOTTING TIME
ACTIVATED CLOTTING TIME: 175 s
Activated Clotting Time: 235 seconds
Activated Clotting Time: 202 seconds

## 2015-10-04 LAB — POCT I-STAT, CHEM 8
BUN: 17 mg/dL (ref 6–20)
Calcium, Ion: 1.25 mmol/L — ABNORMAL HIGH (ref 1.12–1.23)
Chloride: 109 mmol/L (ref 101–111)
Creatinine, Ser: 1.1 mg/dL (ref 0.61–1.24)
Glucose, Bld: 102 mg/dL — ABNORMAL HIGH (ref 65–99)
HEMATOCRIT: 47 % (ref 39.0–52.0)
HEMOGLOBIN: 16 g/dL (ref 13.0–17.0)
Potassium: 3.6 mmol/L (ref 3.5–5.1)
SODIUM: 145 mmol/L (ref 135–145)
TCO2: 21 mmol/L (ref 0–100)

## 2015-10-04 LAB — PROTIME-INR
INR: 1.12
PROTHROMBIN TIME: 14.5 s (ref 11.4–15.2)

## 2015-10-04 SURGERY — LOWER EXTREMITY ANGIOGRAPHY

## 2015-10-04 MED ORDER — ACETAMINOPHEN 325 MG RE SUPP: 325.0000 mg | RECTAL | Status: DC | PRN

## 2015-10-04 MED ORDER — METHYLPREDNISOLONE SODIUM SUCC 125 MG IJ SOLR
125.0000 mg | INTRAMUSCULAR | Status: AC
  Administered 2015-10-04: 125 mg via INTRAVENOUS

## 2015-10-04 MED ORDER — DOCUSATE SODIUM 100 MG PO CAPS: 100.0000 mg | ORAL_CAPSULE | Freq: Every day | ORAL | Status: DC

## 2015-10-04 MED ORDER — SODIUM CHLORIDE 0.9 % IV SOLN
INTRAVENOUS | Status: DC
  Administered 2015-10-04: 08:00:00 via INTRAVENOUS

## 2015-10-04 MED ORDER — MORPHINE SULFATE (PF) 10 MG/ML IV SOLN: 2.0000 mg | INTRAVENOUS | Status: DC | PRN

## 2015-10-04 MED ORDER — OXYCODONE HCL 5 MG PO TABS: 5.0000 mg | ORAL_TABLET | ORAL | Status: DC | PRN

## 2015-10-04 MED ORDER — ACETAMINOPHEN 325 MG PO TABS: 325.0000 mg | ORAL_TABLET | ORAL | Status: DC | PRN

## 2015-10-04 MED ORDER — GUAIFENESIN-DM 100-10 MG/5ML PO SYRP: 15.0000 mL | ORAL_SOLUTION | ORAL | Status: DC | PRN

## 2015-10-04 MED ORDER — MIDAZOLAM HCL 2 MG/2ML IJ SOLN
INTRAMUSCULAR | Status: DC | PRN
  Administered 2015-10-04: 2 mg via INTRAVENOUS

## 2015-10-04 MED ORDER — HEPARIN SODIUM (PORCINE) 1000 UNIT/ML IJ SOLN
INTRAMUSCULAR | Status: DC | PRN
  Administered 2015-10-04: 9000 [IU] via INTRAVENOUS

## 2015-10-04 MED ORDER — MIDAZOLAM HCL 2 MG/2ML IJ SOLN
INTRAMUSCULAR | Status: AC
  Filled 2015-10-04: qty 2

## 2015-10-04 MED ORDER — SODIUM CHLORIDE 0.9 % IV SOLN: 1.0000 mL/kg/h | INTRAVENOUS | Status: DC

## 2015-10-04 MED ORDER — HYDRALAZINE HCL 20 MG/ML IJ SOLN: 5.0000 mg | INTRAMUSCULAR | Status: DC | PRN

## 2015-10-04 MED ORDER — LIDOCAINE HCL (PF) 1 % IJ SOLN
INTRAMUSCULAR | Status: AC
  Filled 2015-10-04: qty 30

## 2015-10-04 MED ORDER — DIPHENHYDRAMINE HCL 50 MG/ML IJ SOLN
25.0000 mg | INTRAMUSCULAR | Status: AC
  Administered 2015-10-04: 25 mg via INTRAVENOUS

## 2015-10-04 MED ORDER — FAMOTIDINE IN NACL 20-0.9 MG/50ML-% IV SOLN
20.0000 mg | INTRAVENOUS | Status: AC
  Administered 2015-10-04: 20 mg via INTRAVENOUS

## 2015-10-04 MED ORDER — ALUM & MAG HYDROXIDE-SIMETH 200-200-20 MG/5ML PO SUSP: 15.0000 mL | ORAL | Status: DC | PRN

## 2015-10-04 MED ORDER — FAMOTIDINE IN NACL 20-0.9 MG/50ML-% IV SOLN
INTRAVENOUS | Status: AC
  Administered 2015-10-04: 20 mg via INTRAVENOUS
  Filled 2015-10-04: qty 50

## 2015-10-04 MED ORDER — FENTANYL CITRATE (PF) 100 MCG/2ML IJ SOLN
INTRAMUSCULAR | Status: DC | PRN
  Administered 2015-10-04: 50 ug via INTRAVENOUS

## 2015-10-04 MED ORDER — METOPROLOL TARTRATE 5 MG/5ML IV SOLN: 2.0000 mg | INTRAVENOUS | Status: DC | PRN

## 2015-10-04 MED ORDER — IOPAMIDOL (ISOVUE-370) INJECTION 76%: INTRAVENOUS | Status: DC | PRN

## 2015-10-04 MED ORDER — DIPHENHYDRAMINE HCL 50 MG/ML IJ SOLN
INTRAMUSCULAR | Status: AC
  Administered 2015-10-04: 25 mg via INTRAVENOUS
  Filled 2015-10-04: qty 1

## 2015-10-04 MED ORDER — ONDANSETRON HCL 4 MG/2ML IJ SOLN: 4.0000 mg | Freq: Four times a day (QID) | INTRAMUSCULAR | Status: DC | PRN

## 2015-10-04 MED ORDER — LABETALOL HCL 5 MG/ML IV SOLN: 10.0000 mg | INTRAVENOUS | Status: DC | PRN

## 2015-10-04 MED ORDER — FENTANYL CITRATE (PF) 100 MCG/2ML IJ SOLN
INTRAMUSCULAR | Status: AC
  Filled 2015-10-04: qty 2

## 2015-10-04 MED ORDER — LIDOCAINE HCL (PF) 1 % IJ SOLN
INTRAMUSCULAR | Status: DC | PRN
  Administered 2015-10-04: 20 mL

## 2015-10-04 MED ORDER — METHYLPREDNISOLONE SODIUM SUCC 125 MG IJ SOLR
INTRAMUSCULAR | Status: AC
  Administered 2015-10-04: 125 mg via INTRAVENOUS
  Filled 2015-10-04: qty 2

## 2015-10-04 MED ORDER — IODIXANOL 320 MG/ML IV SOLN
INTRAVENOUS | Status: DC | PRN
  Administered 2015-10-04: 130 mL via INTRA_ARTERIAL

## 2015-10-04 MED ORDER — HEPARIN SODIUM (PORCINE) 1000 UNIT/ML IJ SOLN
INTRAMUSCULAR | Status: AC
  Filled 2015-10-04: qty 1

## 2015-10-04 MED ORDER — HEPARIN (PORCINE) IN NACL 2-0.9 UNIT/ML-% IJ SOLN
INTRAMUSCULAR | Status: AC
  Filled 2015-10-04: qty 1000

## 2015-10-04 MED ORDER — PHENOL 1.4 % MT LIQD: 1.0000 | OROMUCOSAL | Status: DC | PRN

## 2015-10-04 MED ORDER — HEPARIN (PORCINE) IN NACL 2-0.9 UNIT/ML-% IJ SOLN
INTRAMUSCULAR | Status: DC | PRN
  Administered 2015-10-04: 1000 mL

## 2015-10-04 SURGICAL SUPPLY — 16 items
BALLN ANGIOSCULPT OTW 3.0X20 (BALLOONS) ×4
BALLN LUTONIX DCB 4X40X130 (BALLOONS) ×4
BALLOON ANGIOSCULPT OTW 3.0X20 (BALLOONS) ×2 IMPLANT
BALLOON LUTONIX DCB 4X40X130 (BALLOONS) ×2 IMPLANT
COVER PRB 48X5XTLSCP FOLD TPE (BAG) ×2 IMPLANT
COVER PROBE 5X48 (BAG) ×2
KIT ENCORE 26 ADVANTAGE (KITS) ×4 IMPLANT
KIT MICROINTRODUCER STIFF 5F (SHEATH) ×8 IMPLANT
KIT PV (KITS) ×4 IMPLANT
SHEATH PINNACLE 5F 10CM (SHEATH) ×4 IMPLANT
SYR MEDRAD MARK V 150ML (SYRINGE) ×4 IMPLANT
TRANSDUCER W/STOPCOCK (MISCELLANEOUS) ×4 IMPLANT
TRAY PV CATH (CUSTOM PROCEDURE TRAY) ×4 IMPLANT
WIRE BENTSON .035X145CM (WIRE) ×4 IMPLANT
WIRE ROSEN-J .035X180CM (WIRE) ×4 IMPLANT
WIRE SPARTACORE .014X300CM (WIRE) ×4 IMPLANT

## 2015-10-04 NOTE — Op Note (Signed)
    Patient name: [name] [name] MRN: [id number] DOB: 05/26/1945 Sex: male  10/04/2015 Pre-operative Diagnosis: Bypass graft stenosis Post-operative diagnosis:  Same Surgeon:  Durene CalBrabham, [place] Procedure Performed:  1.  Ultrasound-guided access, left femoral artery (antegrade)  2.  Left lower extremity runoff  3.  Drug coated balloon angioplasty, left posterior tibial bypass graft  4.  Conscious sedation (61 minutes)     Indications:  The patient has a history of a left popliteal aneurysm.  This was treated with stenting which failed requiring a femoral to posterior tibial artery bypass graft with vein.  Ultrasound identified a progressive stenosis in the vein graft which has previously been treated.  He is here today for further evaluation  Procedure:  The patient was identified in the holding area and taken to room 8.  The patient was then placed supine on the table and prepped and draped in the usual sterile fashion.  A time out was called.  Conscious sedation was performed with the use of IV fentanyl and Versed under continuous physician and circulating nurse monitoring.  Heart rate blood pressure and oxygen saturations were continuously monitored.  Ultrasound was used to evaluate the left common femoral artery.  It was patent .  A digital ultrasound image was acquired.  A micropuncture needle was used to access the left common femoral artery under ultrasound guidance.  An 018 wire was advanced without resistance and a micropuncture sheath was placed.  The 018 wire was removed and a Rosen wire was placed.  This went into the profunda femoral artery.  I then buddy wired a 018 wire and was able to withdraw the sheath in navigating the 018 wire into the bypass graft.  Next, the Rosen wire was removed and a Teena DunkBenson wire was able to be advanced into the bypass graft.  The dilator for the sheath was reinserted and the sheath was advanced into the bypass graft.  Left lower extremity runoff was then  performed.  Occlusion of all tibial vessels is noted just beyond the bypass graft which is unchanged.  Findings:     Left Lower Extremity:  The left femoral-tibial bypass graft is patent.  The proximal anastomosis is widely patent.  There is a focal 95% stenosis near the distal anastomosis, likely representing a stenotic valve leaflet.  The distal anastomosis is patent throughout it's course.  Intervention:  Next, the patient was fully heparinized.  I advanced a 014 [place] core wire across the stenosis and then performed cutting balloon angioplasty of the lesion taking the balloon to profile for 2 minutes.  I then inserted a 4 x 40 junk coated Lutonix balloon across the lesion and inflated this to nominal pressure for 2 minutes.  Completion imaging revealed resolution of the stenosis, now less than 10%.  Catheters and wires were removed.  The patient be taken the holding area for sheath pull once his coag profile corrects.  Impression:  #1  successful drug coated balloon angioplasty using a 4 mm Lutonix balloon and a 3 x 20 Angiosculpt balloon  #2  occlusion of all tibial vessels   V. Durene CalWells Brabham, M.D. [facility] [place] Office: [phone] Pager:  [phone]

## 2015-10-04 NOTE — Progress Notes (Signed)
175fr antegrade sheath aspirated and removed from lfa. Manual pressure applied for 20 minutes. Groin level 0, bedrest instructions given. Tegaderm dressing applied.   Distal pulses dp and pt are doppler bilaterally. PT pulses extremely faint with doppler.  Bedrest begins at 11:50:00

## 2015-10-04 NOTE — Discharge Instructions (Signed)

## 2015-10-04 NOTE — Interval H&P Note (Signed)
History and Physical Interval Note:  10/04/2015 8:37 AM  [name] [name]  has presented today for surgery, with the diagnosis of pvd with LLE claudication  The various methods of treatment have been discussed with the patient and family. After consideration of risks, benefits and other options for treatment, the patient has consented to  Procedure(s): Abdominal Aortogram w/Lower Extremity (N/A) as a surgical intervention .  The patient's history has been reviewed, patient examined, no change in status, stable for surgery.  I have reviewed the patient's chart and labs.  Questions were answered to the patient's satisfaction.     Durene CalBrabham, [place]

## 2015-10-04 NOTE — H&P (View-Only) (Signed)
[facility] [place] HISTORY AND PHYSICAL   MRN : [id number]  History of Present Illness:   [name] [name] is a 70 y.o. male patient of Dr. Myra GianottiBrabham.  He is status post endovascular repair of abdominal aortic aneurysm in October of 2011. He has a known persistent type II endoleak from a patent lumbar artery, however the size of his aneurysm sac continues to decrease. 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 He is s/p amputation left 4th and 5th toes on 12/10/13, several attempts at revascularization until he had a left femoral popliteal BPG on 10/30/14.  He last saw Dr. Myra GianottiBrabham on 08/09/14 At that time No endoleak is identified. Maximum diameter is 5.0 cm. ABI was is 0.65 on the left (previously was 0.60). On the right it measured 0.84 The velocities in the left leg had significantly improved since his most recent intervention. He no longer had an open wound. Was to continue with close ultrasound surveillance. The next study was to be in 6 months. His aneurysm remained stable. He was to have a repeat ultrasound in one year.   Pt states he walks his a mile daily, he reports left calf "cramp" after walking 20-30 minutes, relieved with rest, this has not changed.  He denies abdominal or back pain.   He had an MI in 2007 with cardiac stent placement.  He denies any history of stroke or TIA.   Pt Diabetic: No  Pt smoker: former smoker, quit in 1988; uses oral tobacco since he quit smoking     Pt meds include: Statin :Yes Betablocker: Yes ASA: yes Other anticoagulants/antiplatelets: warfarin, taking for PAD     Current Outpatient Prescriptions  Medication Sig Dispense Refill  . atorvastatin (LIPITOR) 40 MG tablet Take 1 tablet (40 mg total) by mouth daily. Pt is overdue for an appointment. Please call and schedule for further refills or get from pcp 15 tablet 0  . diphenhydrAMINE (BENADRYL) 50  MG tablet One tablet by mouth 1 hour prior to CT scan with contrast. 1 tablet 0  . metoprolol tartrate (LOPRESSOR) 25 MG tablet TAKE ONE TABLET BY MOUTH TWICE DAILY 60 tablet 6  . Multiple Vitamin (MULTIVITAMIN WITH MINERALS) TABS tablet Take 1 tablet by mouth daily.    . Omega-3 Fatty Acids (FISH OIL TRIPLE STRENGTH) 1400 MG CAPS Take 2,800 mg by mouth 2 (two) times daily.    . Saw Palmetto 450 MG CAPS Take 900 mg by mouth 2 (two) times daily.    Marland Kitchen. warfarin (COUMADIN) 5 MG tablet Two tablets by mouth daily except 1 1/2 tablets on M,W,F. 50 tablet 3  . predniSONE (DELTASONE) 10 MG tablet Take 5 tablets by mouth every six hours starting 13 hours prior to CT scan with contrast. (Patient not taking: Reported on 09/28/2015) 15 tablet 0   No current facility-administered medications for this visit.     Past Medical History:  Diagnosis Date  . Abdominal aortic aneurysm (HCC)   . Chronic kidney disease    Right kidney stone  . Coronary artery disease   . Detached retina   . Hyperlipidemia   . Hypertension   . Myocardial infarction [facility](HCC)    2007 heart stent  . Popliteal aneurysm [facility](HCC)     Social History Social History  Substance Use Topics  . Smoking status: Former Smoker    Years: 15.00    Types: Cigarettes    Quit date: 09/21/1985  .  Smokeless tobacco: Current User    Types: Chew  . Alcohol use No    Family History Family History  Problem Relation Age of Onset  . Aneurysm Sister     possibly   . Peripheral vascular disease Brother     Surgical History Past Surgical History:  Procedure Laterality Date  . ABDOMINAL AORTIC ANEURYSM REPAIR  11/24/09   EVAR  . AMPUTATION Left 12/10/2013   Procedure: AMPUTATION LEFT  FOURTH AND FIFTH TOES;  Surgeon: Nada LibmanVance W Brabham, MD;  [facility];  Service: Vascular;  Laterality: Left;  . COLONOSCOPY    . coronary stenting     2007  . EYE SURGERY Right    cataract removed  . FEMORAL-POPLITEAL BYPASS GRAFT Left 10/29/2013   Procedure:  BYPASS GRAFT FEMORAL-POPLITEAL ARTERY;  Surgeon: Nada LibmanVance W Brabham, MD;  Location: [facility] OR;  Service: Vascular;  Laterality: Left;  Left Femoral to Posterior Tibial artery bypass graft using nonreversed saphenous vein. and thrombectomy of Tibial arterly.  . FEMORAL-TIBIAL BYPASS GRAFT Left 10/30/2013   Procedure: Reexploration of Femoral-Posterior Tibial Bypass; Thrombectomy of Bypass; Left Leg Angiogram; Posterior Tibial Vein Patch Angioplasty;  Surgeon: Nada LibmanVance W Brabham, MD;  Location: [facility] OR;  Service: Vascular;  Laterality: Left;  . LOWER EXTREMITY ANGIOGRAM N/A 05/25/2014   Procedure: LOWER EXTREMITY ANGIOGRAM;  Surgeon: Nada LibmanVance W Brabham, MD;  [facility];  Service: Cardiovascular;  Laterality: N/A;  . POPLITEAL ARTERY STENT  11/2009   left popliteal artery   . RETINAL DETACHMENT SURGERY Right 2011  . TOOTH EXTRACTION  June 2015   Wisdom Tooth Extraction    Allergies  Allergen Reactions  . Omnipaque [Iohexol] Rash    Pt requires full premeds and needs to bring a driver.  Broke out in rash on back and arms.  Ok w/ premeds  . Oxytetracycline Rash    Terramycin causes rash  . Alteplase Other (See Comments)    Internal bleeding, per RN in short stay. Patient requested this to be added to his allergy list    Current Outpatient Prescriptions  Medication Sig Dispense Refill  . atorvastatin (LIPITOR) 40 MG tablet Take 1 tablet (40 mg total) by mouth daily. Pt is overdue for an appointment. Please call and schedule for further refills or get from pcp 15 tablet 0  . diphenhydrAMINE (BENADRYL) 50 MG tablet One tablet by mouth 1 hour prior to CT scan with contrast. 1 tablet 0  . metoprolol tartrate (LOPRESSOR) 25 MG tablet TAKE ONE TABLET BY MOUTH TWICE DAILY 60 tablet 6  . Multiple Vitamin (MULTIVITAMIN WITH MINERALS) TABS tablet Take 1 tablet by mouth daily.    . Omega-3 Fatty Acids (FISH OIL TRIPLE STRENGTH) 1400 MG CAPS Take 2,800 mg by mouth 2 (two) times daily.    . Saw Palmetto 450 MG  CAPS Take 900 mg by mouth 2 (two) times daily.    Marland Kitchen. warfarin (COUMADIN) 5 MG tablet Two tablets by mouth daily except 1 1/2 tablets on M,W,F. 50 tablet 3  . predniSONE (DELTASONE) 10 MG tablet Take 5 tablets by mouth every six hours starting 13 hours prior to CT scan with contrast. (Patient not taking: Reported on 09/28/2015) 15 tablet 0   No current facility-administered medications for this visit.      REVIEW OF SYSTEMS: See HPI for pertinent positives and negatives.  Physical Examination Vitals:   09/28/15 1049  BP: 107/64  Pulse: (!) 57  Resp: 18  Temp: 97.6 F (36.4 C)  SpO2: 99%  Weight: 211 lb (95.7 kg)  Height: 6\' 2"  (1.88 m)   Body mass index is 27.09 kg/m.  General: A&O x 3, WD male  Pulmonary: Respirations are non labored, no cough. Cardiac: RRR  Vascular: Extremities: No gangrene, no open wounds. Left 4th and 5th toes are surgically absent.  Vessel  Right  Left   Radial  2+Palpable  2+Palpable   Carotid  without bruit  without bruit   Aorta  Not palpable  N/A   Femoral  2+Palpable  2+Palpable   Popliteal  Not palpable  Not palpable   PT   audible by Doppler  audible by Doppler  DP  audible by Doppler   audible by Doppler    Gastrointestinal: soft, NTND, -G/R, - HSM, - palpable masses, - CVAT B.  Musculoskeletal: M/S 5/5 throughout, see Vascular.  Neurologic: CN 2-12 grossly intact, Motor exam as listed above   07/05/15 CTA abd/pevis to evaluate LLQ abdominal pain, hx divericulsosis: Aortoiliac stent graft is stable. There is no evidence for leak. The aneurysm is unchanged. Calcifications are present within the iliac arteries bilaterally. No significant adenopathy is present.   Non-Invasive Vascular Imaging (09/28/15):   CEREBROVASCULAR DUPLEX EVALUATION    INDICATION: Peripheral arterial disease    PREVIOUS INTERVENTION(S): Left femoral to proximal posterior tibial artery bypass graft of 10/29/13 with thrombectomy on  10/31/14; history of left popliteal stent repair in 2011 with subsequent occlusion; EVAR in 2011    DUPLEX EXAM:     RIGHT  LEFT  Peak Systolic Velocities (cm/s) End Diastolic Velocities (cm/s) Plaque LOCATION Peak Systolic Velocities (cm/s) End Diastolic Velocities (cm/s) Plaque  91 21  CCA PROXIMAL 110 22   113 25  CCA MID 129 30   101 25  CCA DISTAL 121 29 HT  106 18  ECA 103 12   101 28 HT ICA PROXIMAL 68 15 CP     ICA MID 72 26   72 23  ICA DISTAL 92 35      ICA / CCA Ratio (PSV)   Antegrade Vertebral Flow Antegrade   Brachial Systolic Pressure (mmHg)   Triphasic Subclavian Artery Waveforms Triphasic    Plaque Morphology:  HM = Homogeneous, HT = Heterogeneous, CP = Calcific Plaque, SP = Smooth Plaque, IP = Irregular Plaque     ADDITIONAL FINDINGS:      1. Doppler velocities suggest a 1-39% right proximal internal carotid artery stenosis 2. Doppler velocities suggest a 1-39% left proximal internal carotid artery stenosis.    Compared to the previous exam:  No previous exam for comparison.     LOWER EXTREMITY ARTERIAL DUPLEX EVALUATION    INDICATION: Follow up bypass graft     PREVIOUS INTERVENTION(S): Left femoral to proximal posterior tibial artery bypass graft of 10/29/13 with thrombectomy on 10/31/14; history of left popliteal stent repair in 2011 with subsequent occlusion; EVAR in 2011    DUPLEX EXAM:     RIGHT  LEFT   Peak Systolic Velocity (cm/s) Ratio (if abnormal) Waveform  Peak Systolic Velocity (cm/s) Ratio (if abnormal) Waveform     Inflow Artery 74       Proximal Anastomosis 80       Proximal Graft 55       Mid Graft 29        Distal Graft 50>266>94 5.32      Distal Anastomosis 63       Outflow Artery 29    0.91/0.59 Today's ABI / TBI 0.47/0  0.8/0.66 Previous ABI /  TBI (03/29/15) 0.6/0.21    Waveform:    M - Monophasic       B - Biphasic       T - Triphasic  If Ankle Brachial Index (ABI) or Toe Brachial Index (TBI) performed, please see complete report      ADDITIONAL FINDINGS:     IMPRESSION: Patent left leg bypass graft with >70% stenosis noted below the knee.      Compared to the previous exam:  Stenosis is new since last study of 03/29/15.     ASSESSMENT:  [name] [name] is a 70 y.o. male who is status post endovascular repair of abdominal aortic aneurysm in October of 2011. He has a known persistent type II endoleak from a patent lumbar artery, however the size of his aneurysm sac continues to decrease. 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  He is s/p amputation left 4th and 5th toes on 12/10/13, several attempts at revascularization until he had a left femoral popliteal BPG on 10/30/14.  He has mild claudication in his left calf, no signs of ischemia in his feet/legs, this has not changed since his last visit.  Carotid duplex today suggests minimal bilateral ICA stenoses. He has no history of stroke or TIA.   S/P EVAR in 2011 - He had a CTA abd/pelvis in May 2017 requested by another provider to evaluate LLQ abdominal pain, hx diverticulosis - This indicated aortoiliac stent graft is stable. There is no evidence for leak. The aneurysm sac size is unchanged. Calcifications are present within the iliac arteries bilaterally.  Today's left LE arterial duplex suggests left leg bypass graft with >70% stenosis noted below the knee. Stenosis is new since last study of 03/29/15. Right ABI improved from 80% to 91% arterial perfusion, but left ABI decreased from 60% to 47%, a 22% decrease.  His atherosclerotic risk factors include prior smoking, current use of smokeless tobacco, and CAD.  Creatinine was 1.05 in May, 2017.  Face to face time with patient was 25 minutes. Over 50% of this time was spent on counseling and coordination of care.  PLAN:   The patient was counseled re smokeless tobacco cessation and given several free resources re tobacco cessation.  Based on today's  exam and non-invasive vascular lab results, and after discussing with Dr. Edilia Bo, the patient will be scheduled within the next 4 weeks for an arteriogram with bilateral runoff, possible left LE intervention by Dr. Myra Gianotti.  I discussed in depth with the patient the nature of atherosclerosis, and emphasized the importance of maximal medical management including strict control of blood pressure, blood glucose, and lipid levels, obtaining regular exercise, and cessation of smoking.  The patient is aware that without maximal medical management the underlying atherosclerotic disease process will progress, limiting the benefit of any interventions.  The patient was given information about stroke prevention and what symptoms should prompt the patient to seek immediate medical care.  The patient was given information about PAD including signs, symptoms, treatment, what symptoms should prompt the patient to seek immediate medical care, and risk reduction measures to take. Thank you for allowing [place] to participate in this patient's care.  Charisse March, RN, MSN, FNP-C [facility] Office: [phone]  Clinic MD: Edilia Bo 09/28/2015 11:22 AM

## 2015-10-04 NOTE — Progress Notes (Signed)
Up and walked and tolerated well and groin stable no bleeding or hematoma

## 2015-10-10 ENCOUNTER — Other Ambulatory Visit: Payer: Self-pay | Admitting: *Deleted

## 2015-10-10 MED ORDER — ATORVASTATIN CALCIUM 40 MG PO TABS: 40.0000 mg | ORAL_TABLET | Freq: Every day | ORAL | 0 refills | Status: DC

## 2015-10-10 NOTE — Telephone Encounter (Signed)
Okay to refill until upcoming appointment.

## 2015-10-10 NOTE — Telephone Encounter (Signed)
Patient had his lipids drawn by his pcp in March and it was recommended that he repeat those labs again in three months. He did not do so. Just wanted to be sure ok to extend refill until patients appt with Dr Excell Seltzerooper and that dose needs to remain the same. Please advise. Thanks, MI

## 2015-10-11 ENCOUNTER — Telehealth: Payer: Self-pay | Admitting: Family Medicine

## 2015-10-11 DIAGNOSIS — E785 Hyperlipidemia, unspecified: Secondary | ICD-10-CM

## 2015-10-11 NOTE — Telephone Encounter (Signed)
Please review. Thanks!  

## 2015-10-11 NOTE — Telephone Encounter (Signed)
Pt stated that it was time to have his labs done. Pt would like to get a lab slip prior to his appt Friday 10/14/15. Please advise. Thanks TNP

## 2015-10-11 NOTE — Telephone Encounter (Signed)
Future order place in chart to recheck labs regarding hyperlipidemia.

## 2015-10-12 NOTE — Telephone Encounter (Signed)
Patient advised.

## 2015-10-14 ENCOUNTER — Ambulatory Visit (INDEPENDENT_AMBULATORY_CARE_PROVIDER_SITE_OTHER): Payer: Medicare Other | Admitting: Emergency Medicine

## 2015-10-14 DIAGNOSIS — I82409 Acute embolism and thrombosis of unspecified deep veins of unspecified lower extremity: Secondary | ICD-10-CM

## 2015-10-14 DIAGNOSIS — E785 Hyperlipidemia, unspecified: Secondary | ICD-10-CM | POA: Diagnosis not present

## 2015-10-14 LAB — POCT INR
INR: 2.2
PT: 26.1

## 2015-10-14 NOTE — Patient Instructions (Signed)
Anticoagulation Dose Instructions as of 10/14/2015      [name] SmilesSun Mon Tue Wed Thu Fri Sat   New Dose 10 mg 7.5 mg 10 mg 10 mg 10 mg 7.5 mg 10 mg    Description   10 mg daily except for 7.5 mg on M and F. F/U 2 weeks

## 2015-10-15 LAB — COMPREHENSIVE METABOLIC PANEL
A/G RATIO: 1.8 (ref 1.2–2.2)
ALT: 24 IU/L (ref 0–44)
AST: 27 IU/L (ref 0–40)
Albumin: 4.3 g/dL (ref 3.5–4.8)
Alkaline Phosphatase: 68 IU/L (ref 39–117)
BILIRUBIN TOTAL: 1.9 mg/dL — AB (ref 0.0–1.2)
BUN/Creatinine Ratio: 11 (ref 10–24)
BUN: 12 mg/dL (ref 8–27)
CALCIUM: 9.4 mg/dL (ref 8.6–10.2)
CHLORIDE: 108 mmol/L — AB (ref 96–106)
CO2: 24 mmol/L (ref 18–29)
Creatinine, Ser: 1.09 mg/dL (ref 0.76–1.27)
GFR calc Af Amer: 78 mL/min/{1.73_m2} (ref >59)
GFR, EST NON AFRICAN AMERICAN: 67 mL/min/{1.73_m2} (ref >59)
GLOBULIN, TOTAL: 2.4 g/dL (ref 1.5–4.5)
Glucose: 100 mg/dL — ABNORMAL HIGH (ref 65–99)
POTASSIUM: 4.4 mmol/L (ref 3.5–5.2)
SODIUM: 144 mmol/L (ref 134–144)
Total Protein: 6.7 g/dL (ref 6.0–8.5)

## 2015-10-15 LAB — CBC WITH DIFFERENTIAL/PLATELET
BASOS: 0 %
Basophils Absolute: 0 10*3/uL (ref 0.0–0.2)
EOS (ABSOLUTE): 0.3 10*3/uL (ref 0.0–0.4)
EOS: 5 %
HEMATOCRIT: 46.1 % (ref 37.5–51.0)
Hemoglobin: 15.6 g/dL (ref 12.6–17.7)
IMMATURE GRANULOCYTES: 0 %
Immature Grans (Abs): 0 10*3/uL (ref 0.0–0.1)
LYMPHS ABS: 1.7 10*3/uL (ref 0.7–3.1)
Lymphs: 28 %
MCH: 31.6 pg (ref 26.6–33.0)
MCHC: 33.8 g/dL (ref 31.5–35.7)
MCV: 93 fL (ref 79–97)
MONOS ABS: 0.6 10*3/uL (ref 0.1–0.9)
Monocytes: 10 %
NEUTROS ABS: 3.4 10*3/uL (ref 1.4–7.0)
NEUTROS PCT: 57 %
PLATELETS: 197 10*3/uL (ref 150–379)
RBC: 4.94 x10E6/uL (ref 4.14–5.80)
RDW: 14.2 % (ref 12.3–15.4)
WBC: 6.1 10*3/uL (ref 3.4–10.8)

## 2015-10-15 LAB — LIPID PANEL
CHOL/HDL RATIO: 3.9 ratio (ref 0.0–5.0)
Cholesterol, Total: 109 mg/dL (ref 100–199)
HDL: 28 mg/dL — AB (ref >39)
LDL Calculated: 47 mg/dL (ref 0–99)
TRIGLYCERIDES: 171 mg/dL — AB (ref 0–149)
VLDL Cholesterol Cal: 34 mg/dL (ref 5–40)

## 2015-10-18 ENCOUNTER — Telehealth: Payer: Self-pay

## 2015-10-18 NOTE — Telephone Encounter (Signed)
-----   Message from Tamsen Roersennis E [name], [place] sent at 10/18/2015 12:06 AM EDT ----- All blood tests normal except HDL low. Triglycerides improved. Continue Atorvastatin and exercise as surgeon allows. Watch fats in diet.

## 2015-10-18 NOTE — Telephone Encounter (Signed)
Patient advised.

## 2015-10-21 ENCOUNTER — Ambulatory Visit: Payer: Self-pay

## 2015-10-28 ENCOUNTER — Ambulatory Visit (INDEPENDENT_AMBULATORY_CARE_PROVIDER_SITE_OTHER): Payer: Medicare Other

## 2015-10-28 DIAGNOSIS — I82409 Acute embolism and thrombosis of unspecified deep veins of unspecified lower extremity: Secondary | ICD-10-CM | POA: Diagnosis not present

## 2015-10-28 LAB — POCT INR
INR: 3
PT: 36.3

## 2015-10-28 NOTE — Patient Instructions (Signed)
Anticoagulation Dose Instructions as of 10/28/2015      [name] SmilesSun Mon Tue Wed Thu Fri Sat   New Dose 10 mg 7.5 mg 10 mg 10 mg 10 mg 7.5 mg 10 mg    Description   10 mg daily except for 7.5 mg on M and F. F/U 2 weeks

## 2015-11-11 ENCOUNTER — Telehealth: Payer: Self-pay | Admitting: Surgery

## 2015-11-11 ENCOUNTER — Other Ambulatory Visit: Payer: Self-pay

## 2015-11-11 ENCOUNTER — Ambulatory Visit (INDEPENDENT_AMBULATORY_CARE_PROVIDER_SITE_OTHER): Payer: Medicare Other

## 2015-11-11 ENCOUNTER — Other Ambulatory Visit: Payer: Self-pay | Admitting: *Deleted

## 2015-11-11 DIAGNOSIS — I82409 Acute embolism and thrombosis of unspecified deep veins of unspecified lower extremity: Secondary | ICD-10-CM | POA: Diagnosis not present

## 2015-11-11 DIAGNOSIS — I739 Peripheral vascular disease, unspecified: Secondary | ICD-10-CM

## 2015-11-11 DIAGNOSIS — Z9862 Peripheral vascular angioplasty status: Secondary | ICD-10-CM

## 2015-11-11 LAB — POCT INR
INR: 3
PT: 36

## 2015-11-11 NOTE — Patient Instructions (Signed)
Anticoagulation Dose Instructions as of 11/11/2015      [name] SmilesSun Mon Tue Wed Thu Fri Sat   New Dose 10 mg 7.5 mg 10 mg 10 mg 10 mg 7.5 mg 10 mg    Description   Continue 10 mg daily except for 7.5 mg on M and F. F/U 4 weeks

## 2015-11-11 NOTE — Telephone Encounter (Signed)
-----   Message from Sharee PimpleMarilyn K McChesney, RN sent at 11/11/2015 12:19 PM EDT ----- Regarding: RE: schedule I got my crystal ball out and put orders x2 in,  ABIs and LLE arterial duplex.  We have to schedule ABI with any of the LE scans per Lab protocols.   LE duplex ----- Message ----- From: Salvadore OxfordBarbara E Graves Sent: 11/11/2015  11:57 AM To: Sharee PimpleMarilyn K McChesney, RN Subject: RE: schedule                                   Can you tell me what kind of duplex he is wanting? Or Just put the order in so I can place the order?   Thanks Britta MccreedyBarbara  ----- Message ----- From: Sharee PimpleMarilyn K McChesney, RN Sent: 10/04/2015  11:08 AM To: [facility] [facility] Pool Subject: schedule                                         ----- Message ----- From: Nada LibmanVance W Brabham, MD Sent: 10/04/2015   9:52 AM To: [facility] Charge Pool  10/04/2015:    Surgeon:  Durene CalBrabham, [place] Procedure Performed:  1.  Ultrasound-guided access, left femoral artery (antegrade)  2.  Left lower extremity runoff  3.  Drug coated balloon angioplasty, left posterior tibial bypass graft  4.  Conscious sedation (61 minutes)  Follow-up in 3 months with duplex to see [name] ChessmanSuzanne

## 2015-11-11 NOTE — Telephone Encounter (Signed)
LVM on home # will mail letter as well, for appt on 11/27 with 2 [place]

## 2015-11-14 ENCOUNTER — Other Ambulatory Visit: Payer: Self-pay | Admitting: Family Medicine

## 2015-11-21 ENCOUNTER — Telehealth: Payer: Self-pay | Admitting: Family Medicine

## 2015-11-21 NOTE — Telephone Encounter (Signed)
lvm for pt to schedule AWV and CPE for 10/24 -knb

## 2015-11-25 ENCOUNTER — Encounter: Payer: Self-pay | Admitting: Family Medicine

## 2015-11-25 ENCOUNTER — Ambulatory Visit (INDEPENDENT_AMBULATORY_CARE_PROVIDER_SITE_OTHER): Payer: Medicare Other | Admitting: Family Medicine

## 2015-11-25 VITALS — BP 102/58 | HR 62 | Temp 98.1°F | Resp 14 | Ht 73.75 in | Wt 215.6 lb

## 2015-11-25 DIAGNOSIS — I739 Peripheral vascular disease, unspecified: Secondary | ICD-10-CM | POA: Diagnosis not present

## 2015-11-25 DIAGNOSIS — Z23 Encounter for immunization: Secondary | ICD-10-CM

## 2015-11-25 DIAGNOSIS — Z0000 Encounter for general adult medical examination without abnormal findings: Secondary | ICD-10-CM | POA: Diagnosis not present

## 2015-11-25 NOTE — Progress Notes (Signed)
Patient: [name] [name], Male    DOB: 12/12/1945, 70 y.o.   MRN: [id number] Visit Date: 11/25/2015  Today's Provider: Dortha Kernennis Deion Swift, PA   Chief Complaint  Patient presents with  . Medicare Wellness   Subjective:    Annual wellness visit [name] Heritagehomas E Snyders is a 70 y.o. male who presents today for his Subsequent Annual Wellness Visit. He feels fairly well. He reports walking daily for exercise. He reports he is sleeping average 7 hours per night.  -----------------------------------------------------------   Review of Systems  Constitutional: Negative.   HENT: Negative.   Eyes: Negative.   Respiratory: Negative.   Cardiovascular: Negative.   Gastrointestinal: Negative.   Endocrine: Negative.   Genitourinary: Negative.   Musculoskeletal: Negative.   Skin: Negative.   Allergic/Immunologic: Negative.   Neurological: Negative.   Hematological: Negative.   Psychiatric/Behavioral: Negative.     Social History   Social History  . Marital status: Married    Spouse name: N/A  . Number of children: N/A  . Years of education: N/A   Occupational History  . Retired    Social History Main Topics  . Smoking status: Former Smoker    Years: 15.00    Types: Cigarettes    Quit date: 09/21/1985  . Smokeless tobacco: Current User    Types: Chew  . Alcohol use No  . Drug use: No  . Sexual activity: Not on file   Other Topics Concern  . Not on file   Social History Narrative   Lives with family.    Patient Active Problem List   Diagnosis Date Noted  . Pseudoaphakia 08/13/2014  . Posterior vitreous detachment 08/13/2014  . Detached retina 08/13/2014  . Cataract 08/13/2014  . Ischemic foot 11/30/2013  . PAD (peripheral artery disease) (HCC) 10/29/2013  . Bradycardia 10/25/2013  . Lower limb ischemia 10/23/2013  . Popliteal artery occlusion, left (HCC) 10/23/2013  . Redness of skin-Left leg/foot 10/21/2013  . Paresthesia of left foot 10/21/2013  . Sensation of cold  in lower extremity-Left foot 10/21/2013  . Pain of left lower leg 10/21/2013  . PVD (peripheral vascular disease) (HCC) 08/03/2013  . AAA (abdominal aortic aneurysm) (HCC) 06/23/2013  . HTN (hypertension) 06/23/2013  . Popliteal aneurysm (HCC) 07/28/2012  . Aneurysm of artery of lower extremity (HCC) 12/03/2011  . Aneurysm of abdominal vessel (HCC) 05/28/2011  . Hyperlipidemia 10/11/2009  . CORONARY ATHEROSCLEROSIS NATIVE CORONARY ARTERY 10/11/2009  . ABDOMINAL BRUIT 10/11/2009  . OTHER DYSPNEA AND RESPIRATORY ABNORMALITIES 10/11/2009    Past Surgical History:  Procedure Laterality Date  . ABDOMINAL AORTIC ANEURYSM REPAIR  11/24/09   EVAR  . AMPUTATION Left 12/10/2013   Procedure: AMPUTATION LEFT  FOURTH AND FIFTH TOES;  Surgeon: Nada LibmanVance W Brabham, MD;  [facility];  Service: Vascular;  Laterality: Left;  . COLONOSCOPY    . coronary stenting     2007  . EYE SURGERY Right    cataract removed  . FEMORAL-POPLITEAL BYPASS GRAFT Left 10/29/2013   Procedure: BYPASS GRAFT FEMORAL-POPLITEAL ARTERY;  Surgeon: Nada LibmanVance W Brabham, MD;  Location: [facility] OR;  Service: Vascular;  Laterality: Left;  Left Femoral to Posterior Tibial artery bypass graft using nonreversed saphenous vein. and thrombectomy of Tibial arterly.  . FEMORAL-TIBIAL BYPASS GRAFT Left 10/30/2013   Procedure: Reexploration of Femoral-Posterior Tibial Bypass; Thrombectomy of Bypass; Left Leg Angiogram; Posterior Tibial Vein Patch Angioplasty;  Surgeon: Nada LibmanVance W Brabham, MD;  Location: [facility] OR;  Service: Vascular;  Laterality: Left;  . LOWER EXTREMITY ANGIOGRAM N/A 05/25/2014  Procedure: LOWER EXTREMITY ANGIOGRAM;  Surgeon: Nada Libman, MD;  Location: [facility] CATH LAB;  Service: Cardiovascular;  Laterality: N/A;  . PERIPHERAL VASCULAR CATHETERIZATION Left 10/04/2015   Procedure: Lower Extremity Angiography;  Surgeon: Nada Libman, MD;  Location: [facility] INVASIVE CV LAB;  Service: Cardiovascular;  Laterality: Left;  . PERIPHERAL VASCULAR  CATHETERIZATION Left 10/04/2015   Procedure: Peripheral Vascular Intervention;  Surgeon: Nada Libman, MD;  Location: [facility] INVASIVE CV LAB;  Service: Cardiovascular;  Laterality: Left;  . POPLITEAL ARTERY STENT  11/2009   left popliteal artery   . RETINAL DETACHMENT SURGERY Right 2011  . TOOTH EXTRACTION  June 2015   Wisdom Tooth Extraction    His family history includes Aneurysm in his sister; Peripheral vascular disease in his brother.    Previous Medications   ATORVASTATIN (LIPITOR) 40 MG TABLET    Take 1 tablet (40 mg total) by mouth daily. Please keep November appointment for further refills   DIPHENHYDRAMINE (BENADRYL) 50 MG TABLET    One tablet by mouth 1 hour prior to CT scan with contrast.   METOPROLOL TARTRATE (LOPRESSOR) 25 MG TABLET    TAKE ONE TABLET BY MOUTH TWICE DAILY   MULTIPLE VITAMIN (MULTIVITAMIN WITH MINERALS) TABS TABLET    Take 1 tablet by mouth daily.   OMEGA-3 FATTY ACIDS (FISH OIL TRIPLE STRENGTH) 1400 MG CAPS    Take 2,800 mg by mouth 2 (two) times daily.   SAW PALMETTO 450 MG CAPS    Take 900 mg by mouth 2 (two) times daily.   WARFARIN (COUMADIN) 5 MG TABLET    TAKE TWO TABLETS BY MOUTH ONCE DAILY EXCEPT  TAKE  ONE  AND  ONE-HALF  TABLETS  BY  MOUTH  ON  MONDAY,  WEDNESDAY,  AND  FRIDAY    Patient Care Team: Tamsen Roers, PA as PCP - General (Family Medicine) Tonny Bollman, MD (Cardiology)     Objective:   Vitals: BP (!) 102/58 (BP Location: Right Arm, Patient Position: Sitting, Cuff Size: Large)   Pulse 62   Temp 98.1 F (36.7 C) (Oral)   Resp 14   Ht 6' 1.75" (1.873 m)   Wt 215 lb 9.6 oz (97.8 kg)   BMI 27.87 kg/m   Physical Exam  Constitutional: He is oriented to person, place, and time. He appears well-developed and well-nourished.  HENT:  Head: Normocephalic and atraumatic.  Right Ear: External ear normal.  Left Ear: External ear normal.  Nose: Nose normal.  Mouth/Throat: Oropharynx is clear and moist.  Eyes: Conjunctivae and EOM  are normal. Pupils are equal, round, and reactive to light. Right eye exhibits no discharge.  Neck: Normal range of motion. Neck supple. No tracheal deviation present. No thyromegaly present.  Cardiovascular: Normal rate, regular rhythm and normal heart sounds.   No murmur heard. Weak pulses in feet and lower legs with history of PAD followed by vascular surgeon. Well healed scars left groin, inner thigh and lower leg from past vascular procedures.  Pulmonary/Chest: Effort normal and breath sounds normal. No respiratory distress. He has no wheezes. He has no rales. He exhibits no tenderness.  Abdominal: Soft. He exhibits no distension and no mass. There is no tenderness. There is no rebound and no guarding.  Genitourinary: Rectum normal and penis normal. Rectal exam shows guaiac negative stool.  Genitourinary Comments: Enlargement of prostate - right lobe.  Musculoskeletal: Normal range of motion. He exhibits no edema or tenderness.  Amputated left 4th and 5th toes.  Lymphadenopathy:    He has no cervical adenopathy.  Neurological: He is alert and oriented to person, place, and time. He has normal reflexes. No cranial nerve deficit. He exhibits normal muscle tone. Coordination normal.  Skin: Skin is warm and dry. No rash noted. No erythema.  Psychiatric: He has a normal mood and affect. His behavior is normal. Judgment and thought content normal.    Activities of Daily Living In your present state of health, do you have any difficulty performing the following activities: 11/25/2015 10/04/2015  Hearing? N N  Vision? N N  Difficulty concentrating or making decisions? N N  Walking or climbing stairs? N N  Dressing or bathing? N N  Doing errands, shopping? N -  Some recent data might be hidden    Fall Risk Assessment Fall Risk  11/25/2015  Falls in the past year? No     Depression Screen PHQ 2/9 Scores 11/25/2015  PHQ - 2 Score 0    Cognitive Testing - 6-CIT  Correct? Score   What  year is it? yes 0 0 or 4  What month is it? yes 0 0 or 3  Memorize:    Floyde Parkins,  40,  [address]  [place],      What time is it? (within 1 hour) yes 0 0 or 3  Count backwards from 20 yes 0 0, 2, or 4  Name the months of the year yes 0 0, 2, or 4  Repeat name & address above yes 4 0, 2, 4, 6, 8, or 10       TOTAL SCORE  4/28   Interpretation:  Normal  Normal (0-7) Abnormal (8-28)   Audit-C Alcohol Use Screening  Question Answer Points  How often do you have alcoholic drink? 1 times monthly 1  On days you do drink alcohol, how many drinks do you typically consume? 1 or 2 0  How oftey will you drink 6 or more in a total? never 0  Total Score:  1   A score of 3 or more in women, and 4 or more in men indicates increased risk for alcohol abuse, EXCEPT if all of the points are from question 1.      Assessment & Plan:     Annual Wellness Visit  Reviewed patient's Family Medical History Reviewed and updated list of patient's medical providers Assessment of cognitive impairment was done Assessed patient's functional ability Established a written schedule for health screening services Health Risk Assessent Completed and Reviewed  Exercise Activities and Dietary recommendations Goals    None      Immunization History  Administered Date(s) Administered  . Influenza, High Dose Seasonal PF 01/14/2015  . Tdap 12/17/2013    Health Maintenance  Topic Date Due  . Hepatitis C Screening  December 17, 1945  . ZOSTAVAX  05/16/2003  . PNA vac Low Risk Adult (1 of 2 - PCV13) 05/15/2008  . INFLUENZA VACCINE  09/13/2015  . COLONOSCOPY  02/16/2016  . TETANUS/TDAP  12/18/2023      Discussed health benefits of physical activity, and encouraged him to engage in regular exercise appropriate for his age and condition.    ------------------------------------------------------------------------------------------------------------ 1. Medicare annual wellness visit, subsequent See above  note.  2. PAD (peripheral artery disease) (HCC) Status post endovascular repair of a left popliteal aneurysm in October of 2011.  He is s/p amputation left 4th and 5th toes on 12/10/13, several attempts at revascularization until he had a left femoral popliteal BPG on  10/30/14. Continue follow up with Dr. Myra Gianotti (vascular specialist).  3. Need for influenza vaccination - Flu vaccine HIGH DOSE PF

## 2015-12-09 ENCOUNTER — Ambulatory Visit (INDEPENDENT_AMBULATORY_CARE_PROVIDER_SITE_OTHER): Payer: Medicare Other

## 2015-12-09 DIAGNOSIS — I82409 Acute embolism and thrombosis of unspecified deep veins of unspecified lower extremity: Secondary | ICD-10-CM

## 2015-12-09 LAB — POCT INR
INR: 3.1
PT: 37.6

## 2015-12-09 NOTE — Patient Instructions (Signed)
Anticoagulation Dose Instructions as of 12/09/2015      [name] SmilesSun Mon Tue Wed Thu Fri Sat   New Dose 10 mg 7.5 mg 10 mg 10 mg 10 mg 7.5 mg 10 mg    Description   Continue 10 mg daily except for 7.5 mg on M and F. F/U 4 weeks

## 2015-12-28 ENCOUNTER — Encounter: Payer: Self-pay | Admitting: Surgery

## 2015-12-30 ENCOUNTER — Encounter: Payer: Self-pay | Admitting: Cardiovascular Disease

## 2015-12-30 ENCOUNTER — Ambulatory Visit (INDEPENDENT_AMBULATORY_CARE_PROVIDER_SITE_OTHER): Payer: Medicare Other | Admitting: Cardiovascular Disease

## 2015-12-30 VITALS — BP 130/70 | HR 70 | Ht 74.0 in | Wt 211.8 lb

## 2015-12-30 DIAGNOSIS — I779 Disorder of arteries and arterioles, unspecified: Secondary | ICD-10-CM

## 2015-12-30 DIAGNOSIS — I10 Essential (primary) hypertension: Secondary | ICD-10-CM

## 2015-12-30 DIAGNOSIS — I739 Peripheral vascular disease, unspecified: Secondary | ICD-10-CM

## 2015-12-30 DIAGNOSIS — E785 Hyperlipidemia, unspecified: Secondary | ICD-10-CM | POA: Diagnosis not present

## 2015-12-30 NOTE — Progress Notes (Signed)
Cardiology Office Note Date:  12/30/2015   ID:  [name] [name], DOB 03/09/1945, MRN [id number]  PCP:  Dortha Kernennis [name], PA  Cardiologist:  Tonny Bollmanooper, Breyah Akhter, MD    Chief Complaint  Patient presents with  . Atherosclerosis of native coronary artery     History of Present Illness: [name] Heritagehomas E Mangen is a 70 y.o. male who presents for follow-up of CAD. The patient had an inferior wall MI approximately 8 years ago. He was treated with bare-metal stenting of the right coronary artery in 2007. He also has significant vascular disease and has undergone endovascular treatment of an abdominal aortic aneurysm and left popliteal artery aneurysm.  The patient is here longer. He's doing well from a cardiac perspective. He denies chest pain, chest pressure, or shortness of breath. He has had to go for another endovascular intervention for treatment of his lower extremity peripheral arterial disease. He was experiencing claudication symptoms and they have now resolved.  Past Medical History:  Diagnosis Date  . Abdominal aortic aneurysm (HCC)   . Chronic kidney disease    Right kidney stone  . Coronary artery disease   . Detached retina   . Hyperlipidemia   . Hypertension   . Myocardial infarction    2007 heart stent  . Popliteal aneurysm [facility](HCC)     Past Surgical History:  Procedure Laterality Date  . ABDOMINAL AORTIC ANEURYSM REPAIR  11/24/09   EVAR  . AMPUTATION Left 12/10/2013   Procedure: AMPUTATION LEFT  FOURTH AND FIFTH TOES;  Surgeon: Nada LibmanVance W Brabham, MD;  [facility];  Service: Vascular;  Laterality: Left;  . COLONOSCOPY    . coronary stenting     2007  . EYE SURGERY Right    cataract removed  . FEMORAL-POPLITEAL BYPASS GRAFT Left 10/29/2013   Procedure: BYPASS GRAFT FEMORAL-POPLITEAL ARTERY;  Surgeon: Nada LibmanVance W Brabham, MD;  Location: [facility] OR;  Service: Vascular;  Laterality: Left;  Left Femoral to Posterior Tibial artery bypass graft using nonreversed saphenous vein. and  thrombectomy of Tibial arterly.  . FEMORAL-TIBIAL BYPASS GRAFT Left 10/30/2013   Procedure: Reexploration of Femoral-Posterior Tibial Bypass; Thrombectomy of Bypass; Left Leg Angiogram; Posterior Tibial Vein Patch Angioplasty;  Surgeon: Nada LibmanVance W Brabham, MD;  Location: [facility] OR;  Service: Vascular;  Laterality: Left;  . LOWER EXTREMITY ANGIOGRAM N/A 05/25/2014   Procedure: LOWER EXTREMITY ANGIOGRAM;  Surgeon: Nada LibmanVance W Brabham, MD;  [facility];  Service: Cardiovascular;  Laterality: N/A;  . PERIPHERAL VASCULAR CATHETERIZATION Left 10/04/2015   Procedure: Lower Extremity Angiography;  Surgeon: Nada LibmanVance W Brabham, MD;  Location: [facility] INVASIVE CV LAB;  Service: Cardiovascular;  Laterality: Left;  . PERIPHERAL VASCULAR CATHETERIZATION Left 10/04/2015   Procedure: Peripheral Vascular Intervention;  Surgeon: Nada LibmanVance W Brabham, MD;  [facility];  Service: Cardiovascular;  Laterality: Left;  . POPLITEAL ARTERY STENT  11/2009   left popliteal artery   . RETINAL DETACHMENT SURGERY Right 2011  . TOOTH EXTRACTION  June 2015   Wisdom Tooth Extraction    Current Outpatient Prescriptions  Medication Sig Dispense Refill  . atorvastatin (LIPITOR) 40 MG tablet Take 1 tablet (40 mg total) by mouth daily. Please keep November appointment for further refills 90 tablet 0  . diphenhydrAMINE (BENADRYL) 50 MG tablet One tablet by mouth 1 hour prior to CT scan with contrast. 1 tablet 0  . metoprolol tartrate (LOPRESSOR) 25 MG tablet TAKE ONE TABLET BY MOUTH TWICE DAILY 60 tablet 6  . Multiple Vitamin (MULTIVITAMIN WITH MINERALS) TABS  tablet Take 1 tablet by mouth daily.    . Omega-3 Fatty Acids (FISH OIL TRIPLE STRENGTH) 1400 MG CAPS Take 2,800 mg by mouth 2 (two) times daily.    . Saw Palmetto 450 MG CAPS Take 900 mg by mouth 2 (two) times daily.    Marland Kitchen. warfarin (COUMADIN) 5 MG tablet TAKE TWO TABLETS BY MOUTH ONCE DAILY EXCEPT  TAKE  ONE  AND  ONE-HALF  TABLETS  BY  MOUTH  ON  MONDAY,  WEDNESDAY,  AND   FRIDAY 50 tablet 3   No current facility-administered medications for this visit.     Allergies:   Omnipaque [iohexol]; Oxytetracycline; and Alteplase   Social History:  The patient  reports that he quit smoking about 30 years ago. His smoking use included Cigarettes. He quit after 15.00 years of use. His smokeless tobacco use includes Chew. He reports that he does not drink alcohol or use drugs.   Family History:  The patient's  family history includes Aneurysm in his sister; Peripheral vascular disease in his brother.    ROS:  Please see the history of present illness. All other systems are reviewed and negative.    PHYSICAL EXAM: VS:  BP 130/70   Pulse 70   Ht 6\' 2"  (1.88 m)   Wt 211 lb 12.8 oz (96.1 kg)   BMI 27.19 kg/m  , BMI Body mass index is 27.19 kg/m. GEN: Well nourished, well developed, in no acute distress  HEENT: normal  Neck: no JVD, no masses. No carotid bruits Cardiac: RRR without murmur or gallop                Respiratory:  clear to auscultation bilaterally, normal work of breathing GI: soft, nontender, nondistended, + BS MS: no deformity or atrophy  Ext: no pretibial edema Skin: warm and dry, no rash Neuro:  Strength and sensation are intact Psych: euthymic mood, full affect  EKG:  EKG is ordered today. The ekg ordered today shows normal sinus rhythm 71 bpm, inferolateral infarct age undetermined, no change in previous tracing  Recent Labs: 10/04/2015: Hemoglobin 16.0 10/14/2015: ALT 24; BUN 12; Creatinine, Ser 1.09; Platelets 197; Potassium 4.4; Sodium 144   Lipid Panel     Component Value Date/Time   CHOL 109 10/14/2015 1014   TRIG 171 (H) 10/14/2015 1014   HDL 28 (L) 10/14/2015 1014   CHOLHDL 3.9 10/14/2015 1014   LDLCALC 47 10/14/2015 1014      Wt Readings from Last 3 Encounters:  12/30/15 211 lb 12.8 oz (96.1 kg)  11/25/15 215 lb 9.6 oz (97.8 kg)  10/04/15 210 lb (95.3 kg)    ASSESSMENT AND PLAN: 1.  CAD, native vessel: no angina.  Patient will continue on his current medical program. I will see him back in one year.  2. Hyperlipidemia: Recent lipids reviewed with LDL at goal.  3. Lower extremity peripheral arterial disease: Patient with complex history of left popliteal aneurysm, covered stenting, surgical bypass, and balloon angioplasty. He is doing well at present with no symptoms. He follows regularly with Dr. Myra GianottiBrabham. He continues on warfarin.  Current medicines are reviewed with the patient today.  The patient does not have concerns regarding medicines.  Labs/ tests ordered today include:  No orders of the defined types were placed in this encounter.   Disposition:   FU one year  Signed, Tonny Bollmanooper, Heavyn Yearsley, MD  12/30/2015 3:08 PM    [facility] [address], [place], [place]  [address] Phone: [phone]; Fax: [phone]

## 2015-12-30 NOTE — Patient Instructions (Signed)

## 2016-01-03 ENCOUNTER — Other Ambulatory Visit: Payer: Self-pay | Admitting: Cardiovascular Disease

## 2016-01-04 ENCOUNTER — Ambulatory Visit (INDEPENDENT_AMBULATORY_CARE_PROVIDER_SITE_OTHER): Payer: Medicare Other

## 2016-01-04 DIAGNOSIS — I82409 Acute embolism and thrombosis of unspecified deep veins of unspecified lower extremity: Secondary | ICD-10-CM | POA: Diagnosis not present

## 2016-01-04 DIAGNOSIS — I779 Disorder of arteries and arterioles, unspecified: Secondary | ICD-10-CM | POA: Diagnosis not present

## 2016-01-04 LAB — POCT INR
INR: 2.5
PT: 30.5

## 2016-01-04 NOTE — Patient Instructions (Signed)
Anticoagulation Dose Instructions as of 01/04/2016      [name] Mon Tue Wed Thu Fri Sat   New Dose 10 mg 7.5 mg 10 mg 10 mg 10 mg 7.5 mg 10 mg    Description   Continue 10 mg daily except for 7.5 mg on M and F. F/U 4 weeks

## 2016-01-06 ENCOUNTER — Ambulatory Visit: Payer: Self-pay

## 2016-01-09 ENCOUNTER — Encounter: Payer: Self-pay | Admitting: Surgery

## 2016-01-09 ENCOUNTER — Other Ambulatory Visit: Payer: Self-pay | Admitting: Surgery

## 2016-01-09 ENCOUNTER — Ambulatory Visit (HOSPITAL_COMMUNITY)
Admission: RE | Admit: 2016-01-09 | Discharge: 2016-01-09 | Disposition: A | Discharge status: Home / Self Care | Payer: Medicare Other | Source: Ambulatory Visit | Attending: Surgery | Admitting: Surgery

## 2016-01-09 ENCOUNTER — Ambulatory Visit (INDEPENDENT_AMBULATORY_CARE_PROVIDER_SITE_OTHER): Payer: Medicare Other | Admitting: Surgery

## 2016-01-09 VITALS — BP 103/65 | HR 55 | Temp 97.2°F | Resp 16 | Ht 74.0 in | Wt 210.2 lb

## 2016-01-09 DIAGNOSIS — I739 Peripheral vascular disease, unspecified: Secondary | ICD-10-CM | POA: Diagnosis not present

## 2016-01-09 DIAGNOSIS — Y832 Surgical operation with anastomosis, bypass or graft as the cause of abnormal reaction of the patient, or of later complication, without mention of misadventure at the time of the procedure: Secondary | ICD-10-CM | POA: Diagnosis not present

## 2016-01-09 DIAGNOSIS — Z9862 Peripheral vascular angioplasty status: Secondary | ICD-10-CM | POA: Diagnosis not present

## 2016-01-09 DIAGNOSIS — I779 Disorder of arteries and arterioles, unspecified: Secondary | ICD-10-CM

## 2016-01-09 DIAGNOSIS — R0989 Other specified symptoms and signs involving the circulatory and respiratory systems: Secondary | ICD-10-CM | POA: Diagnosis present

## 2016-01-09 DIAGNOSIS — T82858A Stenosis of vascular prosthetic devices, implants and grafts, initial encounter: Secondary | ICD-10-CM | POA: Insufficient documentation

## 2016-01-09 NOTE — Progress Notes (Signed)
[facility] [place]  Patient name: [name] [name] MRN: [id number] DOB: 08/09/1945 Sex: male  REASON FOR VISIT: follow up  HPI: The patient comes in today for followup. He initially underwent endovascular abdominal aortic aneurysm repair in October of 2011. He has a known type II endoleak, however his aneurysm continues to decrease. He is also status post endovascular repair of a left popliteal artery aneurysm on October 2011.  He presented to the office on 10/21/2013 with a six-day history of pain and coldness in his left foot. He was found to have an occluded popliteal stent by angiography the following day. An attempt at from a lysis was performed, however the patient developed some cardiac issues as well as bleeding, and this had to be discontinued. The patient continued to have an ischemic foot and therefore he was taken to the operating room for revascularization. On 10/29/2013, he underwent femoral to posterior tibial artery bypass graft. He had an excellent caliber vein. The bypass graft occluded early and he was taken back. The bypass was safe to be re-opened. I explored the posterior tibial artery behind the ankle. I passed catheters all the way into the pedal arch and injected TPA. The patient was transitioned to oral anticoagulation and ultimately discharged. While he was in the hospital .  He developed bypass graft stenosis and on 05/25/2014 he underwent drug coated balloon angioplasty to the distal portion of his bypass graft.  He had recurrent stenosis within the distal vein graft and in 8 2017 he underwent drug coated balloon angioplasty with angiasculpt.  He has known occlusion of all 3 tibial vessels.  He appears to be doing well at this time with no specific complaints.  He denies any changes to his claudication symptoms.  He does not have open wounds or rest pain.  He continues to take his Coumadin.  Past Medical History:    Diagnosis Date  . Abdominal aortic aneurysm (HCC)   . Chronic kidney disease    Right kidney stone  . Coronary artery disease   . Detached retina   . Hyperlipidemia   . Hypertension   . Myocardial infarction    2007 heart stent  . Popliteal aneurysm (HCC)     Family History  Problem Relation Age of Onset  . Aneurysm Sister     possibly   . Peripheral vascular disease Brother     SOCIAL HISTORY: Social History  Substance Use Topics  . Smoking status: Former Smoker    Years: 15.00    Types: Cigarettes    Quit date: 09/21/1985  . Smokeless tobacco: Current User    Types: Chew  . Alcohol use No    Allergies  Allergen Reactions  . Omnipaque [Iohexol] Rash    Pt requires full premeds and needs to bring a driver.  Broke out in rash on back and arms.  Ok w/ premeds  . Oxytetracycline Rash    Terramycin causes rash  . Alteplase Other (See Comments)    Internal bleeding, per RN in short stay. Patient requested this to be added to his allergy list    Current Outpatient Prescriptions  Medication Sig Dispense Refill  . atorvastatin (LIPITOR) 40 MG tablet TAKE ONE TABLET BY MOUTH ONCE DAILY. PLEASE KEEP NOVEMBER APPOINTMENT FOR FURTHER REFILLS 90 tablet 3  . diphenhydrAMINE (BENADRYL) 50 MG tablet One tablet by mouth 1 hour prior to CT scan with contrast. 1 tablet 0  . metoprolol tartrate (LOPRESSOR) 25 MG tablet TAKE  ONE TABLET BY MOUTH TWICE DAILY 60 tablet 6  . Multiple Vitamin (MULTIVITAMIN WITH MINERALS) TABS tablet Take 1 tablet by mouth daily.    . Omega-3 Fatty Acids (FISH OIL TRIPLE STRENGTH) 1400 MG CAPS Take 2,800 mg by mouth 2 (two) times daily.    . Saw Palmetto 450 MG CAPS Take 900 mg by mouth 2 (two) times daily.    Marland Kitchen. warfarin (COUMADIN) 5 MG tablet TAKE TWO TABLETS BY MOUTH ONCE DAILY EXCEPT  TAKE  ONE  AND  ONE-HALF  TABLETS  BY  MOUTH  ON  MONDAY,  WEDNESDAY,  AND  FRIDAY 50 tablet 3   No current facility-administered medications for this visit.     REVIEW  OF SYSTEMS:  [X]  denotes positive finding, [ ]  denotes negative finding Cardiac  Comments:  Chest pain or chest pressure:    Shortness of breath upon exertion:    Short of breath when lying flat:    Irregular heart rhythm:        Vascular    Pain in calf, thigh, or hip brought on by ambulation:    Pain in feet at night that wakes you up from your sleep:     Blood clot in your veins:    Leg swelling:         Pulmonary    Oxygen at home:    Productive cough:     Wheezing:         Neurologic    Sudden weakness in arms or legs:     Sudden numbness in arms or legs:     Sudden onset of difficulty speaking or slurred speech:    Temporary loss of vision in one eye:     Problems with dizziness:         Gastrointestinal    Blood in stool:     Vomited blood:         Genitourinary    Burning when urinating:     Blood in urine:        Psychiatric    Major depression:         Hematologic    Bleeding problems:    Problems with blood clotting too easily:        Skin    Rashes or ulcers:        Constitutional    Fever or chills:      PHYSICAL EXAM: There were no vitals filed for this visit.  GENERAL: The patient is a well-nourished male, in no acute distress. The vital signs are documented above. CARDIAC: There is a regular rate and rhythm.  VASCULAR: non-palpable left pedal pulse PULMONARY: There is good air exchange bilaterally without wheezing or rales. ABDOMEN: Soft and non-tender with normal pitched bowel sounds.  MUSCULOSKELETAL: There are no major deformities or cyanosis. NEUROLOGIC: No focal weakness or paresthesias are detected. SKIN: There are no ulcers or rashes noted. PSYCHIATRIC: The patient has a normal affect.  DATA:  I have reviewed his ultrasound studies today.  The right ABI is 1.0 with triphasic waveforms.  The left is 0.73 with monophasic waveforms.  His duplex of the left leg shows that the bypass graft is widely patent.  MEDICAL ISSUES: The  patient continues to remain stable after intervention to the distal bypass graft stenosis.  I have recommended every 6 month surveillance because he has occlusion of all 3 tibial vessels and if his bypass graft occludes, I suspect we will have a difficult time getting it opened  again.  He will remain on Coumadin as long as his bypass graft is patent.    Durene Cal, MD [facility] [phone] Pager [phone]

## 2016-01-10 NOTE — Addendum Note (Signed)
Addended by: Burton ApleyPETTY, Yanelle Sousa A on: 01/10/2016 08:24 AM   Modules accepted: Orders

## 2016-01-26 ENCOUNTER — Ambulatory Visit (INDEPENDENT_AMBULATORY_CARE_PROVIDER_SITE_OTHER): Payer: Medicare Other | Admitting: Family Medicine

## 2016-01-26 ENCOUNTER — Encounter: Payer: Self-pay | Admitting: Family Medicine

## 2016-01-26 VITALS — BP 114/68 | HR 97 | Temp 98.7°F | Resp 18 | Wt 208.8 lb

## 2016-01-26 DIAGNOSIS — I779 Disorder of arteries and arterioles, unspecified: Secondary | ICD-10-CM

## 2016-01-26 DIAGNOSIS — B9789 Other viral agents as the cause of diseases classified elsewhere: Secondary | ICD-10-CM | POA: Diagnosis not present

## 2016-01-26 DIAGNOSIS — J069 Acute upper respiratory infection, unspecified: Secondary | ICD-10-CM

## 2016-01-26 MED ORDER — CEFDINIR 300 MG PO CAPS: 300.0000 mg | ORAL_CAPSULE | Freq: Two times a day (BID) | ORAL | 0 refills | Status: DC

## 2016-01-26 MED ORDER — HYDROCODONE-HOMATROPINE 5-1.5 MG/5ML PO SYRP: ORAL_SOLUTION | ORAL | 0 refills | Status: DC

## 2016-01-26 NOTE — Progress Notes (Signed)
Subjective:     Patient ID: [name] [name], male   DOB: 02/25/1945, 70 y.o.   MRN: [id number]  HPI  Chief Complaint  Patient presents with  . URI    Patient states symptoms of congestion, headache, and cough started 1.5 weeks ago. He has been taking OTC allergy medication with mild relief.   [place] became [place]. Reports cough productive of purulent sputum.   Review of Systems     Objective:   Physical Exam  Constitutional: He appears well-developed and well-nourished. No distress.  Ears: T.M's intact without inflammation Throat: no tonsillar enlargement or exudate Neck: no cervical adenopathy Lungs: clear     Assessment:    1. Viral upper respiratory tract infection - HYDROcodone-homatropine (HYCODAN) 5-1.5 MG/5ML syrup; 5 ml 4-6 hours as needed for cough  Dispense: 240 mL; Refill: 0 - cefdinir (OMNICEF) 300 MG capsule; Take 1 capsule (300 mg total) by mouth 2 (two) times daily.  Dispense: 14 capsule; Refill: 0    Plan:    Discussed use of Mucinex and Delsym. If cough/sputum not improving over the next few days to start abx.

## 2016-01-26 NOTE — Patient Instructions (Signed)
Add Mucinex for an expectorant. May also use Delsym for a non-sedating cough syrup. Start the antibiotic if sputum not clearing over the next few days.

## 2016-02-03 ENCOUNTER — Ambulatory Visit (INDEPENDENT_AMBULATORY_CARE_PROVIDER_SITE_OTHER): Payer: Medicare Other

## 2016-02-03 DIAGNOSIS — I82409 Acute embolism and thrombosis of unspecified deep veins of unspecified lower extremity: Secondary | ICD-10-CM

## 2016-02-03 DIAGNOSIS — I779 Disorder of arteries and arterioles, unspecified: Secondary | ICD-10-CM | POA: Diagnosis not present

## 2016-02-03 LAB — POCT INR
INR: 2.5
PT: 29.5

## 2016-02-03 NOTE — Patient Instructions (Signed)
INR as of 02/03/2016 and Previous Dosing Information    INR Dt INR Goal Jacki ConesWkly Tot Sun Mon Tue Wed Thu Fri Sat   02/03/2016 2.5 2.5-3.5 65 mg 10 mg 7.5 mg 10 mg 10 mg 10 mg 7.5 mg 10 mg    Previous description   Continue 10 mg daily except for 7.5 mg on M and F. F/U 4 weeks   Anticoagulation Dose Instructions as of 02/03/2016      Total Sun Mon Tue Wed Thu Fri Sat   New Dose 65 mg 10 mg 7.5 mg 10 mg 10 mg 10 mg 7.5 mg 10 mg     (5 mg x 2)  (5 mg x 1.5)  (5 mg x 2)  (5 mg x 2)  (5 mg x 2)  (5 mg x 1.5)  (5 mg x 2)                         Description   Continue 10 mg daily except for 7.5 mg on M and F. F/U 4 weeks

## 2016-02-03 NOTE — Progress Notes (Signed)
Still in therapeutic range. Continue Coumadin 10 mg qd except 7.5 mg on Monday and Friday. Recheck protime in a month.

## 2016-03-02 ENCOUNTER — Ambulatory Visit (INDEPENDENT_AMBULATORY_CARE_PROVIDER_SITE_OTHER): Payer: Medicare Other | Admitting: Emergency Medicine

## 2016-03-02 DIAGNOSIS — I82409 Acute embolism and thrombosis of unspecified deep veins of unspecified lower extremity: Secondary | ICD-10-CM | POA: Diagnosis not present

## 2016-03-02 LAB — POCT INR
INR: 2.9
PT: 34.5

## 2016-03-02 NOTE — Progress Notes (Signed)
Very good results. Continue present dosage of coumadin and recheck protime in a month

## 2016-03-02 NOTE — Patient Instructions (Signed)
Anticoagulation Dose Instructions as of 03/02/2016      [name] Mon Tue Wed Thu Fri Sat   New Dose 10 mg 7.5 mg 10 mg 10 mg 10 mg 7.5 mg 10 mg    Description   Continue 10 mg daily except for 7.5 mg on M and F. F/U 4 weeks

## 2016-03-04 ENCOUNTER — Other Ambulatory Visit: Payer: Self-pay | Admitting: Family Medicine

## 2016-03-17 ENCOUNTER — Other Ambulatory Visit: Payer: Self-pay | Admitting: Family Medicine

## 2016-04-06 ENCOUNTER — Ambulatory Visit (INDEPENDENT_AMBULATORY_CARE_PROVIDER_SITE_OTHER): Payer: Medicare Other

## 2016-04-06 DIAGNOSIS — I82409 Acute embolism and thrombosis of unspecified deep veins of unspecified lower extremity: Secondary | ICD-10-CM | POA: Diagnosis not present

## 2016-04-06 LAB — POCT INR
INR: 3.4
PT: 40.9

## 2016-04-06 NOTE — Patient Instructions (Signed)
Anticoagulation Dose Instructions as of 04/06/2016      [name] Mon Tue Wed Thu Fri Sat   New Dose 10 mg 7.5 mg 10 mg 10 mg 10 mg 7.5 mg 10 mg    Description   Continue 10 mg daily except for 7.5 mg on M and F. F/U 4 weeks

## 2016-05-04 ENCOUNTER — Ambulatory Visit (INDEPENDENT_AMBULATORY_CARE_PROVIDER_SITE_OTHER): Payer: Medicare Other

## 2016-05-04 DIAGNOSIS — I82409 Acute embolism and thrombosis of unspecified deep veins of unspecified lower extremity: Secondary | ICD-10-CM | POA: Diagnosis not present

## 2016-05-04 LAB — POCT INR
INR: 3
PT: 35.6

## 2016-05-04 NOTE — Patient Instructions (Signed)
Anticoagulation Dose Instructions as of 05/04/2016      [name] Mon Tue Wed Thu Fri Sat   New Dose 10 mg 7.5 mg 10 mg 10 mg 10 mg 7.5 mg 10 mg    Description   Continue 10 mg daily except for 7.5 mg on M and F. F/U 4 weeks

## 2016-06-01 ENCOUNTER — Ambulatory Visit: Payer: Self-pay

## 2016-06-06 ENCOUNTER — Ambulatory Visit (INDEPENDENT_AMBULATORY_CARE_PROVIDER_SITE_OTHER): Payer: Medicare Other

## 2016-06-06 DIAGNOSIS — I82409 Acute embolism and thrombosis of unspecified deep veins of unspecified lower extremity: Secondary | ICD-10-CM | POA: Diagnosis not present

## 2016-06-06 LAB — POCT INR
INR: 2.9
PT: 34.6

## 2016-06-06 NOTE — Patient Instructions (Signed)
Anticoagulation Dose Instructions as of 06/06/2016      Glynis Smiles Tue Wed Thu Fri Sat   New Dose 10 mg 7.5 mg 10 mg 10 mg 10 mg 7.5 mg 10 mg    Description   Continue 10 mg daily except for 7.5 mg on M and F. F/U 4 weeks

## 2016-06-07 NOTE — Progress Notes (Signed)
Agree with documentation and instructions.

## 2016-06-13 ENCOUNTER — Other Ambulatory Visit: Payer: Self-pay | Admitting: Family Medicine

## 2016-07-02 ENCOUNTER — Telehealth: Payer: Self-pay | Admitting: Family Medicine

## 2016-07-02 NOTE — Telephone Encounter (Signed)
Lab Results  Component Value Date   CHOL 109 10/14/2015   HDL 28 (L) 10/14/2015   LDLCALC 47 10/14/2015   TRIG 171 (H) 10/14/2015   CHOLHDL 3.9 10/14/2015  All blood tests normal except HDL low. Triglycerides improved. Continue Atorvastatin and exercise as surgeon allows. Watch fats in diet.   Ok to order for pt? Allene DillonEmily Drozdowski, CMA

## 2016-07-02 NOTE — Telephone Encounter (Signed)
Pt is coming in this Friday for pt.  He wants to pick up a lab order to have his lipids done.  ThanksTeri

## 2016-07-04 ENCOUNTER — Ambulatory Visit: Payer: Self-pay

## 2016-07-05 ENCOUNTER — Other Ambulatory Visit: Payer: Self-pay | Admitting: Family Medicine

## 2016-07-05 DIAGNOSIS — E782 Mixed hyperlipidemia: Secondary | ICD-10-CM

## 2016-07-05 NOTE — Telephone Encounter (Signed)
Placed future orders for 6 month follow up of lipid panel, CBC and CMP. Print out requisition when he comes in for his protime on Friday.

## 2016-07-06 ENCOUNTER — Other Ambulatory Visit: Payer: Self-pay | Admitting: Family Medicine

## 2016-07-06 ENCOUNTER — Ambulatory Visit (INDEPENDENT_AMBULATORY_CARE_PROVIDER_SITE_OTHER): Payer: Medicare Other

## 2016-07-06 DIAGNOSIS — I82409 Acute embolism and thrombosis of unspecified deep veins of unspecified lower extremity: Secondary | ICD-10-CM

## 2016-07-06 DIAGNOSIS — E782 Mixed hyperlipidemia: Secondary | ICD-10-CM | POA: Diagnosis not present

## 2016-07-06 LAB — POCT INR
INR: 2.6
PT: 31.8

## 2016-07-07 LAB — CBC WITH DIFFERENTIAL/PLATELET
Basophils Absolute: 0 10*3/uL (ref 0.0–0.2)
Basos: 0 %
EOS (ABSOLUTE): 0.3 10*3/uL (ref 0.0–0.4)
Eos: 5 %
HEMOGLOBIN: 16.3 g/dL (ref 13.0–17.7)
Hematocrit: 48.2 % (ref 37.5–51.0)
IMMATURE GRANS (ABS): 0 10*3/uL (ref 0.0–0.1)
IMMATURE GRANULOCYTES: 0 %
LYMPHS: 25 %
Lymphocytes Absolute: 1.7 10*3/uL (ref 0.7–3.1)
MCH: 31.5 pg (ref 26.6–33.0)
MCHC: 33.8 g/dL (ref 31.5–35.7)
MCV: 93 fL (ref 79–97)
MONOCYTES: 8 %
Monocytes Absolute: 0.6 10*3/uL (ref 0.1–0.9)
NEUTROS ABS: 4.1 10*3/uL (ref 1.4–7.0)
NEUTROS PCT: 62 %
PLATELETS: 221 10*3/uL (ref 150–379)
RBC: 5.17 x10E6/uL (ref 4.14–5.80)
RDW: 13.9 % (ref 12.3–15.4)
WBC: 6.6 10*3/uL (ref 3.4–10.8)

## 2016-07-07 LAB — COMPREHENSIVE METABOLIC PANEL
ALBUMIN: 4.5 g/dL (ref 3.5–4.8)
ALT: 27 IU/L (ref 0–44)
AST: 29 IU/L (ref 0–40)
Albumin/Globulin Ratio: 1.6 (ref 1.2–2.2)
Alkaline Phosphatase: 62 IU/L (ref 39–117)
BUN / CREAT RATIO: 11 (ref 10–24)
BUN: 15 mg/dL (ref 8–27)
Bilirubin Total: 2.1 mg/dL — ABNORMAL HIGH (ref 0.0–1.2)
CALCIUM: 9.5 mg/dL (ref 8.6–10.2)
CO2: 23 mmol/L (ref 18–29)
CREATININE: 1.32 mg/dL — AB (ref 0.76–1.27)
Chloride: 106 mmol/L (ref 96–106)
GFR, EST AFRICAN AMERICAN: 61 mL/min/{1.73_m2} (ref >59)
GFR, EST NON AFRICAN AMERICAN: 53 mL/min/{1.73_m2} — AB (ref >59)
Globulin, Total: 2.8 g/dL (ref 1.5–4.5)
Glucose: 106 mg/dL — ABNORMAL HIGH (ref 65–99)
Potassium: 4.1 mmol/L (ref 3.5–5.2)
Sodium: 144 mmol/L (ref 134–144)
TOTAL PROTEIN: 7.3 g/dL (ref 6.0–8.5)

## 2016-07-07 LAB — LIPID PANEL
CHOL/HDL RATIO: 3.8 ratio (ref 0.0–5.0)
Cholesterol, Total: 114 mg/dL (ref 100–199)
HDL: 30 mg/dL — AB (ref >39)
LDL CALC: 52 mg/dL (ref 0–99)
Triglycerides: 161 mg/dL — ABNORMAL HIGH (ref 0–149)
VLDL CHOLESTEROL CAL: 32 mg/dL (ref 5–40)

## 2016-07-10 ENCOUNTER — Telehealth: Payer: Self-pay

## 2016-07-10 NOTE — Telephone Encounter (Signed)
-----   Message from Tamsen Roersennis E [name], [place] sent at 07/10/2016  7:54 AM EDT ----- Triglycerides slowly improving. Creatinine slightly elevated. Should drink more water. Remainder of tests are normal. Be sure these results are seen by Dr. Myra GianottiBrabham at your appointment this month.

## 2016-07-18 ENCOUNTER — Encounter: Payer: Self-pay | Admitting: Surgery

## 2016-07-23 ENCOUNTER — Encounter: Payer: Self-pay | Admitting: Surgery

## 2016-07-23 ENCOUNTER — Ambulatory Visit (INDEPENDENT_AMBULATORY_CARE_PROVIDER_SITE_OTHER): Payer: Medicare Other | Admitting: Surgery

## 2016-07-23 ENCOUNTER — Ambulatory Visit (HOSPITAL_COMMUNITY)
Admission: RE | Admit: 2016-07-23 | Discharge: 2016-07-23 | Disposition: A | Discharge status: Home / Self Care | Payer: Medicare Other | Source: Ambulatory Visit | Attending: Surgery | Admitting: Surgery

## 2016-07-23 VITALS — BP 106/70 | HR 54 | Temp 97.8°F | Resp 20 | Ht 74.0 in | Wt 207.0 lb

## 2016-07-23 DIAGNOSIS — I779 Disorder of arteries and arterioles, unspecified: Secondary | ICD-10-CM

## 2016-07-23 DIAGNOSIS — I70301 Unspecified atherosclerosis of unspecified type of bypass graft(s) of the extremities, right leg: Secondary | ICD-10-CM | POA: Insufficient documentation

## 2016-07-23 DIAGNOSIS — I724 Aneurysm of artery of lower extremity: Secondary | ICD-10-CM | POA: Diagnosis not present

## 2016-07-23 DIAGNOSIS — I714 Abdominal aortic aneurysm, without rupture, unspecified: Secondary | ICD-10-CM

## 2016-07-23 DIAGNOSIS — Z95820 Peripheral vascular angioplasty status with implants and grafts: Secondary | ICD-10-CM | POA: Diagnosis not present

## 2016-07-23 NOTE — Progress Notes (Signed)
[facility] [place]  Patient name: [name] [name] MRN: [id number] DOB: [birth year] Sex: male   REASON FOR VISIT:    Follow up  [name]:    The patient comes in today for followup. He initially underwent endovascular abdominal aortic aneurysm repair in October of 2011. He has a known type II endoleak, however his aneurysm continues to decrease. He is also status post endovascular repair of a left popliteal artery aneurysm on October 2011.  He presented to the office on 10/21/2013 with a six-day history of pain and coldness in his left foot. He was found to have an occluded popliteal stent by angiography the following day. An attempt at from a lysis was performed, however the patient developed some cardiac issues as well as bleeding, and this had to be discontinued. The patient continued to have an ischemic foot and therefore he was taken to the operating room for revascularization. On 10/29/2013, he underwent femoral to posterior tibial artery bypass graft. He had an excellent caliber vein. The bypass graft occluded early and he was taken back. The bypass was safe to be re-opened. I explored the posterior tibial artery behind the ankle. I passed catheters all the way into the pedal arch and injected TPA. The patient was transitioned to oral anticoagulation and ultimately discharged. While he was in the hospital . He developed bypass graft stenosis and on 05/25/2014 he underwent drug coated balloon angioplasty to the distal portion of his bypass graft.  He had recurrent stenosis within the distal vein graft and in 8 2017 he underwent drug coated balloon angioplasty with angiasculpt.  He has known occlusion of all 3 tibial vessels.  He appears to be doing well at this time with no specific complaints.  He denies any changes to his claudication symptoms.  He does not have open wounds or rest pain.  He continues to take his  Coumadin.He is medically managed for hypercholesterolemia.  His most recent labs show good control.  PAST MEDICAL HISTORY:   Past Medical History:  Diagnosis Date  . Abdominal aortic aneurysm (HCC)   . Chronic kidney disease    Right kidney stone  . Coronary artery disease   . Detached retina   . Hyperlipidemia   . Hypertension   . Myocardial infarction [facility])    2007 heart stent  . Popliteal aneurysm (HCC)      FAMILY HISTORY:   Family History  Problem Relation Age of Onset  . Aneurysm Sister        possibly   . Peripheral vascular disease Brother     SOCIAL HISTORY:   Social History  Substance Use Topics  . Smoking status: Former Smoker    Years: 15.00    Types: Cigarettes    Quit date: 09/21/1985  . Smokeless tobacco: Current User    Types: Chew  . Alcohol use No     ALLERGIES:   Allergies  Allergen Reactions  . Omnipaque [Iohexol] Rash    Pt requires full premeds and needs to bring a driver.  Broke out in rash on back and arms.  Ok w/ premeds  . Oxytetracycline Rash    Terramycin causes rash  . Alteplase Other (See Comments)    Internal bleeding, per RN in short stay. Patient requested this to be added to his allergy list     CURRENT MEDICATIONS:   Current Outpatient Prescriptions  Medication Sig Dispense Refill  . atorvastatin (LIPITOR) 40 MG tablet TAKE ONE  TABLET BY MOUTH ONCE DAILY. PLEASE KEEP NOVEMBER APPOINTMENT FOR FURTHER REFILLS 90 tablet 3  . metoprolol tartrate (LOPRESSOR) 25 MG tablet TAKE ONE TABLET BY MOUTH TWICE DAILY 60 tablet 6  . Multiple Vitamin (MULTIVITAMIN WITH MINERALS) TABS tablet Take 1 tablet by mouth daily.    . Omega-3 Fatty Acids (FISH OIL TRIPLE STRENGTH) 1400 MG CAPS Take 2,800 mg by mouth 2 (two) times daily.    . Saw Palmetto 450 MG CAPS Take 900 mg by mouth 2 (two) times daily.    Marland Kitchen. warfarin (COUMADIN) 5 MG tablet TAKE TWO TABLETS BY MOUTH ONCE DAILY EXCEPT  TAKE  ONE AND ONE-HALF  TABLET  ON  MONDAY,  WEDNESDAY  AND   FRIDAY 50 tablet 3   No current facility-administered medications for this visit.     REVIEW OF SYSTEMS:   [X]  denotes positive finding, [ ]  denotes negative finding Cardiac  Comments:  Chest pain or chest pressure:    Shortness of breath upon exertion:    Short of breath when lying flat:    Irregular heart rhythm:        Vascular    Pain in calf, thigh, or hip brought on by ambulation:    Pain in feet at night that wakes you up from your sleep:     Blood clot in your veins:    Leg swelling:         Pulmonary    Oxygen at home:    Productive cough:     Wheezing:         Neurologic    Sudden weakness in arms or legs:     Sudden numbness in arms or legs:     Sudden onset of difficulty speaking or slurred speech:    Temporary loss of vision in one eye:     Problems with dizziness:         Gastrointestinal    Blood in stool:     Vomited blood:         Genitourinary    Burning when urinating:     Blood in urine:        Psychiatric    Major depression:         Hematologic    Bleeding problems:    Problems with blood clotting too easily:        Skin    Rashes or ulcers:        Constitutional    Fever or chills:      PHYSICAL EXAM:   Vitals:   07/23/16 1152  BP: 106/70  Pulse: (!) 54  Resp: 20  Temp: 97.8 F (36.6 C)  TempSrc: Oral  SpO2: 97%  Weight: 207 lb (93.9 kg)  Height: 6\' 2"  (1.88 m)    GENERAL: The patient is a well-nourished male, in no acute distress. The vital signs are documented above. CARDIAC: There is a regular rate and rhythm.  VASCULAR: no significant edema.  Non palpable pedal pulses PULMONARY: Non-labored respirations MUSCULOSKELETAL: There are no major deformities or cyanosis. NEUROLOGIC: No focal weakness or paresthesias are detected. SKIN: There are no ulcers or rashes noted. PSYCHIATRIC: The patient has a normal affect.  STUDIES:   I have reviewed the ultrasound today which shows no significant change over the past 6  months.  This continued 50-70% stenosis within the distal graft  MEDICAL ISSUES:   Left leg bypass graft for popliteal aneurysm: The patient has stable findings and his bypass graft stenosis.  I  am having him come back in 6 months for repeat evaluation.  If there has been any change for the worse in the stenosis, I will proceed with angiography.  AAA:  When the patient returns in 6 months he will get an ultrasound for follow-up of his aneurysm repair    Durene Cal, MD [facility] [phone] Pager [phone]

## 2016-08-03 ENCOUNTER — Ambulatory Visit (INDEPENDENT_AMBULATORY_CARE_PROVIDER_SITE_OTHER): Payer: Medicare Other

## 2016-08-03 DIAGNOSIS — I82409 Acute embolism and thrombosis of unspecified deep veins of unspecified lower extremity: Secondary | ICD-10-CM

## 2016-08-03 DIAGNOSIS — I779 Disorder of arteries and arterioles, unspecified: Secondary | ICD-10-CM | POA: Diagnosis not present

## 2016-08-03 LAB — POCT INR
INR: 3.4
PT: 40.7

## 2016-08-03 NOTE — Addendum Note (Signed)
Addended by: Burton ApleyPETTY, Sindy Mccune A on: 08/03/2016 10:57 AM   Modules accepted: Orders

## 2016-08-31 ENCOUNTER — Ambulatory Visit (INDEPENDENT_AMBULATORY_CARE_PROVIDER_SITE_OTHER): Payer: Medicare Other | Admitting: Emergency Medicine

## 2016-08-31 DIAGNOSIS — I82409 Acute embolism and thrombosis of unspecified deep veins of unspecified lower extremity: Secondary | ICD-10-CM | POA: Diagnosis not present

## 2016-08-31 DIAGNOSIS — I779 Disorder of arteries and arterioles, unspecified: Secondary | ICD-10-CM

## 2016-08-31 LAB — POCT INR
INR: 3.5
PT: 42.5

## 2016-08-31 NOTE — Patient Instructions (Signed)
Anticoagulation Warfarin Dose Instructions as of 08/31/2016      [name] Mon Tue Wed Thu Fri Sat   New Dose 10 mg 7.5 mg 10 mg 10 mg 10 mg 7.5 mg 10 mg    Description   Continue 10 mg daily except for 7.5 mg on M and F. F/U 4 weeks

## 2016-09-28 ENCOUNTER — Ambulatory Visit (INDEPENDENT_AMBULATORY_CARE_PROVIDER_SITE_OTHER): Payer: Medicare Other

## 2016-09-28 DIAGNOSIS — I779 Disorder of arteries and arterioles, unspecified: Secondary | ICD-10-CM

## 2016-09-28 DIAGNOSIS — I82409 Acute embolism and thrombosis of unspecified deep veins of unspecified lower extremity: Secondary | ICD-10-CM

## 2016-09-28 LAB — POCT INR
INR: 3.4
PT: 40.5

## 2016-09-30 NOTE — Progress Notes (Signed)
Continue present dosage and recheck in a month.

## 2016-10-02 ENCOUNTER — Other Ambulatory Visit: Payer: Self-pay | Admitting: Family Medicine

## 2016-10-02 NOTE — Telephone Encounter (Signed)
[name] [name]'s patient. Last RF 06/14/16.

## 2016-10-03 ENCOUNTER — Telehealth: Payer: Self-pay | Admitting: Family Medicine

## 2016-10-03 NOTE — Telephone Encounter (Signed)
Left message regarding need for AWV

## 2016-10-10 DIAGNOSIS — M79675 Pain in left toe(s): Secondary | ICD-10-CM | POA: Diagnosis not present

## 2016-10-10 DIAGNOSIS — I739 Peripheral vascular disease, unspecified: Secondary | ICD-10-CM | POA: Diagnosis not present

## 2016-10-10 DIAGNOSIS — B351 Tinea unguium: Secondary | ICD-10-CM | POA: Diagnosis not present

## 2016-10-10 DIAGNOSIS — M79674 Pain in right toe(s): Secondary | ICD-10-CM | POA: Diagnosis not present

## 2016-10-14 ENCOUNTER — Other Ambulatory Visit: Payer: Self-pay | Admitting: Family Medicine

## 2016-10-26 ENCOUNTER — Ambulatory Visit: Payer: Medicare Other

## 2016-11-02 ENCOUNTER — Ambulatory Visit (INDEPENDENT_AMBULATORY_CARE_PROVIDER_SITE_OTHER): Payer: Medicare Other

## 2016-11-02 DIAGNOSIS — I82409 Acute embolism and thrombosis of unspecified deep veins of unspecified lower extremity: Secondary | ICD-10-CM

## 2016-11-02 DIAGNOSIS — I779 Disorder of arteries and arterioles, unspecified: Secondary | ICD-10-CM

## 2016-11-02 LAB — POCT INR
INR: 3
PT: 35.6

## 2016-11-02 NOTE — Progress Notes (Signed)
Agree with plan to recheck in a month and continue present dosage of Coumadin.

## 2016-11-02 NOTE — Patient Instructions (Signed)
Anticoagulation Warfarin Dose Instructions as of 11/02/2016      [name] [name] Tue Wed Thu Fri Sat   New Dose 10 mg 7.5 mg 10 mg 10 mg 10 mg 7.5 mg 10 mg    Description   Continue 10 mg daily except for 7.5 mg on M and F. F/U 4 weeks

## 2016-11-30 ENCOUNTER — Ambulatory Visit (INDEPENDENT_AMBULATORY_CARE_PROVIDER_SITE_OTHER): Payer: Medicare Other

## 2016-11-30 DIAGNOSIS — I779 Disorder of arteries and arterioles, unspecified: Secondary | ICD-10-CM | POA: Diagnosis not present

## 2016-11-30 DIAGNOSIS — I82409 Acute embolism and thrombosis of unspecified deep veins of unspecified lower extremity: Secondary | ICD-10-CM

## 2016-11-30 DIAGNOSIS — Z23 Encounter for immunization: Secondary | ICD-10-CM | POA: Diagnosis not present

## 2016-11-30 LAB — POCT INR
INR: 3.3
PT: 39.2

## 2016-11-30 NOTE — Patient Instructions (Signed)
Continue 10mg  daily except 7.5mg  Mondays and Fridays.  Recheck in four weeks.

## 2016-12-05 NOTE — Addendum Note (Signed)
Addended by: Kavin LeechWALSH, Doniesha Landau E on: 12/05/2016 08:28 AM   Modules accepted: Orders

## 2016-12-28 ENCOUNTER — Ambulatory Visit (INDEPENDENT_AMBULATORY_CARE_PROVIDER_SITE_OTHER): Payer: Medicare Other

## 2016-12-28 ENCOUNTER — Ambulatory Visit: Payer: Medicare Other

## 2016-12-28 ENCOUNTER — Other Ambulatory Visit: Payer: Self-pay

## 2016-12-28 VITALS — BP 118/60 | HR 64 | Temp 97.9°F | Ht 74.0 in | Wt 217.0 lb

## 2016-12-28 DIAGNOSIS — I8290 Acute embolism and thrombosis of unspecified vein: Secondary | ICD-10-CM | POA: Diagnosis not present

## 2016-12-28 DIAGNOSIS — Z0000 Encounter for general adult medical examination without abnormal findings: Secondary | ICD-10-CM | POA: Diagnosis not present

## 2016-12-28 NOTE — Patient Instructions (Signed)
Mr. [name] [name] , Thank you for taking time to come for your Medicare Wellness Visit. I appreciate your ongoing commitment to your health goals. Please review the following plan we discussed and let me know if I can assist you in the future.   Screening recommendations/referrals: Colonoscopy: due- pt declined referral today Recommended yearly ophthalmology/optometry visit for glaucoma screening and checkup Recommended yearly dental visit for hygiene and checkup  Vaccinations: Influenza vaccine: completed Pneumococcal vaccine: declined Tdap vaccine: up to date Shingles vaccine: declined  Advanced directives: Advance directive discussed with you today. I have provided a copy for you to complete at home and have notarized. Once this is complete please bring a copy in to our office so we can scan it into your chart.  Conditions/risks identified: Recommend increasing water intake to 4-6 glasses a day.  Next appointment: 01/25/17 @ 10:00 AM  Preventive Care 65 Years and Older, Male Preventive care refers to lifestyle choices and visits with your health care provider that can promote health and wellness. What does preventive care include?  A yearly physical exam. This is also called an annual well check.  Dental exams once or twice a year.  Routine eye exams. Ask your health care provider how often you should have your eyes checked.  Personal lifestyle choices, including:  Daily care of your teeth and gums.  Regular physical activity.  Eating a healthy diet.  Avoiding tobacco and drug use.  Limiting alcohol use.  Practicing safe sex.  Taking low doses of aspirin every day.  Taking vitamin and mineral supplements as recommended by your health care provider. What happens during an annual well check? The services and screenings done by your health care provider during your annual well check will depend on your age, overall health, lifestyle risk factors, and family history of  disease. Counseling  Your health care provider may ask you questions about your:  Alcohol use.  Tobacco use.  Drug use.  Emotional well-being.  Home and relationship well-being.  Sexual activity.  Eating habits.  History of falls.  Memory and ability to understand (cognition).  Work and work Astronomerenvironment. Screening  You may have the following tests or measurements:  Height, weight, and BMI.  Blood pressure.  Lipid and cholesterol levels. These may be checked every 5 years, or more frequently if you are over 73 years old.  Skin check.  Lung cancer screening. You may have this screening every year starting at age 73 if you have a 30-pack-year history of smoking and currently smoke or have quit within the past 15 years.  Fecal occult blood test (FOBT) of the stool. You may have this test every year starting at age 73.  Flexible sigmoidoscopy or colonoscopy. You may have a sigmoidoscopy every 5 years or a colonoscopy every 10 years starting at age 73.  Prostate cancer screening. Recommendations will vary depending on your family history and other risks.  Hepatitis C blood test.  Hepatitis B blood test.  Sexually transmitted disease (STD) testing.  Diabetes screening. This is done by checking your blood sugar (glucose) after you have not eaten for a while (fasting). You may have this done every 1-3 years.  Abdominal aortic aneurysm (AAA) screening. You may need this if you are a current or former smoker.  Osteoporosis. You may be screened starting at age 73 if you are at high risk. Talk with your health care provider about your test results, treatment options, and if necessary, the need for more tests. Vaccines  Your health care provider may recommend certain vaccines, such as:  Influenza vaccine. This is recommended every year.  Tetanus, diphtheria, and acellular pertussis (Tdap, Td) vaccine. You may need a Td booster every 10 years.  Zoster vaccine. You may  need this after age 54.  Pneumococcal 13-valent conjugate (PCV13) vaccine. One dose is recommended after age 45.  Pneumococcal polysaccharide (PPSV23) vaccine. One dose is recommended after age 101. Talk to your health care provider about which screenings and vaccines you need and how often you need them. This information is not intended to replace advice given to you by your health care provider. Make sure you discuss any questions you have with your health care provider. Document Released: 02/25/2015 Document Revised: 10/19/2015 Document Reviewed: 11/30/2014 Elsevier Interactive Patient Education  2017 [place] Prevention in the Home Falls can cause injuries. They can happen to people of all ages. There are many things you can do to make your home safe and to help prevent falls. What can I do on the outside of my home?  Regularly fix the edges of walkways and driveways and fix any cracks.  Remove anything that might make you trip as you walk through a door, such as a raised step or threshold.  Trim any bushes or trees on the path to your home.  Use bright outdoor lighting.  Clear any walking paths of anything that might make someone trip, such as rocks or tools.  Regularly check to see if handrails are loose or broken. Make sure that both sides of any steps have handrails.  Any raised decks and porches should have guardrails on the edges.  Have any leaves, snow, or ice cleared regularly.  Use sand or salt on walking paths during winter.  Clean up any spills in your garage right away. This includes oil or grease spills. What can I do in the bathroom?  Use night lights.  Install grab bars by the toilet and in the tub and shower. Do not use towel bars as grab bars.  Use non-skid mats or decals in the tub or shower.  If you need to sit down in the shower, use a plastic, non-slip stool.  Keep the floor dry. Clean up any water that spills on the floor as soon as it  happens.  Remove soap buildup in the tub or shower regularly.  Attach bath mats securely with double-sided non-slip rug tape.  Do not have throw rugs and other things on the floor that can make you trip. What can I do in the bedroom?  Use night lights.  Make sure that you have a light by your bed that is easy to reach.  Do not use any sheets or blankets that are too big for your bed. They should not hang down onto the floor.  Have a firm chair that has side arms. You can use this for support while you get dressed.  Do not have throw rugs and other things on the floor that can make you trip. What can I do in the kitchen?  Clean up any spills right away.  Avoid walking on wet floors.  Keep items that you use a lot in easy-to-reach places.  If you need to reach something above you, use a strong step stool that has a grab bar.  Keep electrical cords out of the way.  Do not use floor polish or wax that makes floors slippery. If you must use wax, use non-skid floor wax.  Do  not have throw rugs and other things on the floor that can make you trip. What can I do with my stairs?  Do not leave any items on the stairs.  Make sure that there are handrails on both sides of the stairs and use them. Fix handrails that are broken or loose. Make sure that handrails are as long as the stairways.  Check any carpeting to make sure that it is firmly attached to the stairs. Fix any carpet that is loose or worn.  Avoid having throw rugs at the top or bottom of the stairs. If you do have throw rugs, attach them to the floor with carpet tape.  Make sure that you have a light switch at the top of the stairs and the bottom of the stairs. If you do not have them, ask someone to add them for you. What else can I do to help prevent falls?  Wear shoes that:  Do not have high heels.  Have rubber bottoms.  Are comfortable and fit you well.  Are closed at the toe. Do not wear sandals.  If you  use a stepladder:  Make sure that it is fully opened. Do not climb a closed stepladder.  Make sure that both sides of the stepladder are locked into place.  Ask someone to hold it for you, if possible.  Clearly mark and make sure that you can see:  Any grab bars or handrails.  First and last steps.  Where the edge of each step is.  Use tools that help you move around (mobility aids) if they are needed. These include:  Canes.  Walkers.  Scooters.  Crutches.  Turn on the lights when you go into a dark area. Replace any light bulbs as soon as they burn out.  Set up your furniture so you have a clear path. Avoid moving your furniture around.  If any of your floors are uneven, fix them.  If there are any pets around you, be aware of where they are.  Review your medicines with your doctor. Some medicines can make you feel dizzy. This can increase your chance of falling. Ask your doctor what other things that you can do to help prevent falls. This information is not intended to replace advice given to you by your health care provider. Make sure you discuss any questions you have with your health care provider. Document Released: 11/25/2008 Document Revised: 07/07/2015 Document Reviewed: 03/05/2014 Elsevier Interactive Patient Education  2017 [employer]

## 2016-12-28 NOTE — Progress Notes (Signed)
Subjective:   [name] [name] is a 71 y.o. male who presents for Medicare Annual/Subsequent preventive examination.  Review of Systems:  N/A  Cardiac Risk Factors include: advanced age (>1255men, 12>65 women);dyslipidemia;hypertension;male gender;smoking/ tobacco exposure     Objective:    Vitals: BP 118/60 (BP Location: Left Arm)   Pulse 64   Temp 97.9 F (36.6 C) (Oral)   Ht 6\' 2"  (1.88 m)   Wt 217 lb (98.4 kg)   BMI 27.86 kg/m   Body mass index is 27.86 kg/m.  Tobacco Social History   Tobacco Use  Smoking Status Former Smoker  . Years: 15.00  . Types: Cigarettes  . Last attempt to quit: 09/21/1985  . Years since quitting: 31.2  Smokeless Tobacco Current User  . Types: Chew     Ready to quit: Not Answered Counseling given: Not Answered   Past Medical History:  Diagnosis Date  . Abdominal aortic aneurysm (HCC)   . Chronic kidney disease    Right kidney stone  . Coronary artery disease   . Detached retina   . Hyperlipidemia   . Hypertension   . Myocardial infarction [facility](HCC)    2007 heart stent  . Popliteal aneurysm [facility](HCC)    Past Surgical History:  Procedure Laterality Date  . ABDOMINAL AORTIC ANEURYSM REPAIR  11/24/09   EVAR  . AMPUTATION LEFT  FOURTH AND FIFTH TOES Left 12/10/2013   [facility]ada [facility]Brabham[facility]  . ANGIOGRAM EXTREMITY LEFT Left 10/22/2013   Performed by Fransisco Hertzhen, Brian L, [facility]  . BYPASS GRAFT FEMORAL-POPLITEAL ARTERY Left 10/29/2013   Performed by Nada LibmanBrabham, Vance W[facility]  . COLONOSCOPY    . coronary stenting     2007  . EYE SURGERY Right    cataract removed  . LOWER EXTREMITY ANGIOGRAM N/A 05/25/2014   Performed by Nada LibmanBrabham, Vance W[facility]  . Lower Extremity Angiography Left 10/04/2015   Performed by Nada LibmanBrabham, Vance W[facility]  . Peripheral Vascular Intervention Left 10/04/2015   Performed by Nada LibmanBrabham, Vance W, [facility]  . POPLITEAL ARTERY STENT  11/2009   left  popliteal artery   . PTA PERIPHERAL ARTERY Left 05/25/2014   Performed by Nada LibmanBrabham, Vance W[facility]  . Reexploration of Femoral-Posterior Tibial Bypass; Thrombectomy of Bypass; Left Leg Angiogram; Posterior Tibial Vein Patch Angioplasty Left 10/30/2013   Performed by Nada LibmanBrabham, Vance W, [facility]  . RETINAL DETACHMENT SURGERY Right 2011  . TOOTH EXTRACTION  June 2015   Wisdom Tooth Extraction   Family History  Problem Relation Age of Onset  . Aneurysm Sister        possibly   . Peripheral vascular disease Brother    Social History   Substance and Sexual Activity  Sexual Activity Not on file    Outpatient Encounter Medications as of 12/28/2016  Medication Sig  . aspirin 81 MG tablet Take 81 mg daily by mouth.  Marland Kitchen. atorvastatin (LIPITOR) 40 MG tablet TAKE ONE TABLET BY MOUTH ONCE DAILY. PLEASE KEEP NOVEMBER APPOINTMENT FOR FURTHER REFILLS  . metoprolol tartrate (LOPRESSOR) 25 MG tablet TAKE ONE TABLET BY MOUTH TWICE DAILY  . Multiple Vitamin (MULTIVITAMIN WITH MINERALS) TABS tablet Take 1 tablet by mouth daily.  . Omega-3 Fatty Acids (FISH OIL TRIPLE STRENGTH) 1400 MG CAPS Take 2,800 mg by mouth 2 (two) times daily.  .Marland Kitchen  Saw Palmetto 450 MG CAPS Take 900 mg by mouth 2 (two) times daily.  Marland Kitchen. warfarin (COUMADIN) 5 MG tablet TAKE TWO TABLETS BY MOUTH ON TUESDAY, THURSDAY, SATURDAY AND SUNDAY. TAKE ONE AND ONE-HALF TABLET BY MOUTH ON MONDAY, WEDNESDAY, AND FRIDAY (Patient taking differently: TAKE TWO TABLETS BY MOUTH ON TUESDAY, WEDNESDAY, THURSDAY, SATURDAY AND SUNDAY. TAKE ONE AND ONE-HALF TABLET BY MOUTH ON MONDAY AND FRIDAY)   No facility-administered encounter medications on file as of 12/28/2016.     Activities of Daily Living In your present state of health, do you have any difficulty performing the following activities: 12/28/2016  Hearing? N  Vision? N  Difficulty concentrating or making decisions? N  Walking or climbing stairs? N  Dressing or bathing? N  Doing errands,  shopping? N  Preparing Food and eating ? N  Using the Toilet? N  In the past six months, have you accidently leaked urine? N  Do you have problems with loss of bowel control? N  Managing your Medications? N  Managing your Finances? N  Housekeeping or managing your Housekeeping? N  Some recent data might be hidden    Patient Care Team: [name], Jodell Ciproennis E, PA as PCP - General (Family Medicine) Tonny Bollmanooper, Michael, MD (Cardiology) Nada LibmanBrabham, Vance W, MD as Consulting Physician (Vascular Surgery)   Assessment:     Exercise Activities and Dietary recommendations Current Exercise Habits: The patient does not participate in regular exercise at present, Exercise limited by: Other - see comments(due to cold weather)  Goals    . DIET - INCREASE WATER INTAKE     Recommend increasing water intake to 4-6 glasses a day.      Fall Risk Fall Risk  12/28/2016 11/25/2015  Falls in the past year? No No   Depression Screen PHQ 2/9 Scores 12/28/2016 11/25/2015  PHQ - 2 Score 0 0    Cognitive Function: Pt declined screening today.        Immunization History  Administered Date(s) Administered  . Influenza, High Dose Seasonal PF 01/14/2015, 11/25/2015, 11/30/2016  . Tdap 12/17/2013   Screening Tests Health Maintenance  Topic Date Due  . Hepatitis C Screening  1945/03/28  . PNA vac Low Risk Adult (1 of 2 - PCV13) 05/15/2008  . COLONOSCOPY  02/16/2016  . TETANUS/TDAP  12/18/2023  . INFLUENZA VACCINE  Completed      Plan:  I have personally reviewed and addressed the Medicare Annual Wellness questionnaire and have noted the following in the patient's chart:  A. Medical and social history B. Use of alcohol, tobacco or illicit drugs  C. Current medications and supplements D. Functional ability and status E.  Nutritional status F.  Physical activity G. Advance directives H. List of other physicians I.  Hospitalizations, surgeries, and ER visits in previous 12 months J.   Vitals K. Screenings such as hearing and vision if needed, cognitive and depression L. Referrals and appointments - none  In addition, I have reviewed and discussed with patient certain preventive protocols, quality metrics, and best practice recommendations. A written personalized care plan for preventive services as well as general preventive health recommendations were provided to patient.  See attached scanned questionnaire for additional information.   Signed,  Hyacinth MeekerMckenzie Takenya Travaglini, LPN Nurse Health Advisor   MD Recommendations: Pt declined the colonoscopy referral today. Advised pt of stool cards. Pt would like to review the information on the cologuard and will talk further about this with PCP. Pt also declined the Prevnar 13 vaccine and would like  to study up on this before receiving. Pamphlet and information sheet given on Prevnar and cologuard. Pt already had blood work drawn today and will wait on the Hepatitis C screening for next time he has blood work ordered.  Reviewed the Nurse Health Advisor's note and was available for consultation. Agree with documentation and plan. Jodell Cipro [name], PA-C

## 2016-12-29 LAB — PROTIME-INR
INR: 2.7 — ABNORMAL HIGH
Prothrombin Time: 28.8 s — ABNORMAL HIGH (ref 9.0–11.5)

## 2016-12-30 ENCOUNTER — Other Ambulatory Visit: Payer: Self-pay | Admitting: Cardiovascular Disease

## 2016-12-31 ENCOUNTER — Encounter: Payer: Self-pay | Admitting: Cardiovascular Disease

## 2016-12-31 ENCOUNTER — Ambulatory Visit (INDEPENDENT_AMBULATORY_CARE_PROVIDER_SITE_OTHER): Payer: Medicare Other | Admitting: Cardiovascular Disease

## 2016-12-31 VITALS — BP 116/70 | HR 59 | Ht 74.0 in | Wt 212.8 lb

## 2016-12-31 DIAGNOSIS — I739 Peripheral vascular disease, unspecified: Secondary | ICD-10-CM | POA: Diagnosis not present

## 2016-12-31 DIAGNOSIS — I779 Disorder of arteries and arterioles, unspecified: Secondary | ICD-10-CM

## 2016-12-31 DIAGNOSIS — E785 Hyperlipidemia, unspecified: Secondary | ICD-10-CM

## 2016-12-31 DIAGNOSIS — I2510 Atherosclerotic heart disease of native coronary artery without angina pectoris: Secondary | ICD-10-CM

## 2016-12-31 NOTE — Progress Notes (Signed)
Cardiology Office Note Date:  12/31/2016   ID:  [name] [name], DOB 10/12/1945, MRN [id number]  PCP:  Tamsen Roershrismon, Dennis E, PA  Cardiologist:  Audelia HivesMichale Lakely Elmendorf, MD    Chief Complaint  Patient presents with  . Essential Hypertension     History of Present Illness: [name] [name] is a 71 y.o. male who presents for follow-up of CAD.  He first presented in 2009 with an acute inferior wall MI treated with primary PCI of the right coronary artery with a bare-metal stent.  He is also undergone endovascular treatment of an abdominal aortic aneurysm and left popliteal aneurysm.  He ultimately required peripheral bypass.  He is here alone today. Overall doing well. Today, he denies symptoms of palpitations, chest pain, shortness of breath, orthopnea, PND, lower extremity edema, dizziness, or syncope. He has some concerns about his peripheral arterial disease but reports no recent change in symptoms and denies symptoms of claudication with his normal activities.    Past Medical History:  Diagnosis Date  . Abdominal aortic aneurysm (HCC)   . Chronic kidney disease    Right kidney stone  . Coronary artery disease   . Detached retina   . Hyperlipidemia   . Hypertension   . Myocardial infarction [facility](HCC)    2007 heart stent  . Popliteal aneurysm [facility](HCC)     Past Surgical History:  Procedure Laterality Date  . ABDOMINAL AORTIC ANEURYSM REPAIR  11/24/09   EVAR  . AMPUTATION LEFT  FOURTH AND FIFTH TOES Left 12/10/2013   Per[facility] by Nada LibmanBrabham, Vance W[facility]  . ANGIOGRAM EXTREMITY LEFT Left 10/22/2013   Performed by Fransisco Hertzhen, Brian L[facility]  . BYPASS GRAFT FEMORAL-POPLITEAL ARTERY Left 10/29/2013   Performed by Nada LibmanBrabham, Vance W[facility]  . COLONOSCOPY    . coronary stenting     2007  . EYE SURGERY Right    cataract removed  . LOWER EXTREMITY ANGIOGRAM N/A 05/25/2014   Performed by Nada LibmanBrabham, Vance W[facility]  . Lower Extremity Angiography Left 10/04/2015   Performed by Nada LibmanBrabham, Vance W, [facility]  . Peripheral Vascular Intervention Left 10/04/2015   Performed by Nada LibmanBrabham, Vance W[facility]  . POPLITEAL ARTERY STENT  11/2009   left popliteal artery   . PTA PERIPHERAL ARTERY Left 05/25/2014   [facility]formed by Nada LibmanBrabham, Vance W[facility]  . Reexploration of Femoral-Posterior Tibial Bypass; Thrombectomy of Bypass; Left Leg Angiogram; Posterior Tibial Vein Patch Angioplasty Left 10/30/2013   Performed by Nada LibmanBrabham, Vance W[facility]  . RETINAL DETACHMENT SURGERY Right 2011  . TOOTH EXTRACTION  June 2015   Wisdom Tooth Extraction    Current Outpatient Medications  Medication Sig Dispense Refill  . aspirin 81 MG tablet Take 81 mg daily by mouth.    Marland Kitchen. atorvastatin (LIPITOR) 40 MG tablet TAKE ONE TABLET BY MOUTH ONCE DAILY. PLEASE KEEP NOVEMBER APPOINTMENT FOR FURTHER REFILLS 90 tablet 3  . metoprolol tartrate (LOPRESSOR) 25 MG tablet TAKE ONE TABLET BY MOUTH TWICE DAILY 60 tablet 6  . Multiple Vitamin (MULTIVITAMIN WITH MINERALS) TABS tablet Take 1 tablet by mouth daily.    . Omega-3 Fatty Acids (FISH OIL TRIPLE STRENGTH) 1400 MG CAPS Take 2,800 mg by mouth 2 (two) times daily.    . Saw Palmetto 450 MG CAPS Take 900 mg by mouth 2 (two) times daily.    .Marland Kitchen  warfarin (COUMADIN) 5 MG tablet TAKE TWO TABLETS BY MOUTH ON TUESDAY, THURSDAY, SATURDAY AND SUNDAY. TAKE ONE AND ONE-HALF TABLET BY MOUTH ON MONDAY, WEDNESDAY, AND FRIDAY (Patient taking differently: TAKE TWO TABLETS BY MOUTH ON TUESDAY, WEDNESDAY, THURSDAY, SATURDAY AND SUNDAY. TAKE ONE AND ONE-HALF TABLET BY MOUTH ON MONDAY AND FRIDAY) 50 tablet 3   No current facility-administered medications for this visit.     Allergies:   Omnipaque [iohexol]; Oxytetracycline; and Alteplase   Social History:  The patient  reports that he quit smoking about 31 years ago. His smoking use included cigarettes. He quit after 15.00 years of use. His smokeless tobacco use includes  chew. He reports that he drinks alcohol. He reports that he does not use drugs.   Family History:  The patient's  family history includes Aneurysm in his sister; Peripheral vascular disease in his brother.    ROS:  Please see the history of present illness.  All other systems are reviewed and negative.    PHYSICAL EXAM: VS:  BP 116/70   Pulse (!) 59   Ht 6\' 2"  (1.88 m)   Wt 212 lb 12.8 oz (96.5 kg)   BMI 27.32 kg/m  , BMI Body mass index is 27.32 kg/m. GEN: Well nourished, well developed, in no acute distress  HEENT: normal  Neck: no JVD, no masses. No carotid bruits Cardiac: RRR without murmur or gallop                Respiratory:  clear to auscultation bilaterally, normal work of breathing GI: soft, nontender, nondistended, + BS MS: no deformity or atrophy  Ext: no pretibial edema Skin: warm and dry, no rash Neuro:  Strength and sensation are intact Psych: euthymic mood, full affect  EKG:  EKG is ordered today. The ekg ordered today shows NSR with an age-indeterminate inferior infarction. No change from prior.   Recent Labs: 07/06/2016: ALT 27; BUN 15; Creatinine, Ser 1.32; Hemoglobin 16.3; Platelets 221; Potassium 4.1; Sodium 144   Lipid Panel     Component Value Date/Time   CHOL 114 07/06/2016 0956   TRIG 161 (H) 07/06/2016 0956   HDL 30 (L) 07/06/2016 0956   CHOLHDL 3.8 07/06/2016 0956   LDLCALC 52 07/06/2016 0956      Wt Readings from Last 3 Encounters:  12/31/16 212 lb 12.8 oz (96.5 kg)  12/28/16 217 lb (98.4 kg)  07/23/16 207 lb (93.9 kg)    ASSESSMENT AND PLAN: 1.  CAD, native vessel, without angina: doing well on current Rx - no cardiac limitation. Will continue current Rx. Medications reviewed today.   2. Hyperlipidemia: excellent lipid panel on atorvastatin 40 mg daily.   3. PAD - hx popliteal aneurysm, now with left leg bypass grafting. Maintained on ASA 81 mg and warfarin. Followed with serial duplex and clinical FU with Dr Myra GianottiBrabham.   Current  medicines are reviewed with the patient today.  The patient does not have concerns regarding medicines.  Labs/ tests ordered today include:   Orders Placed This Encounter  Procedures  . EKG 12-Lead    Disposition:   FU one year  Signed, Audelia HivesMichale Yordin Rhoda, MD  12/31/2016 1:17 PM    [facility] [address], [place], [place]  [address] Phone: [phone]; Fax: [phone]

## 2016-12-31 NOTE — Patient Instructions (Signed)

## 2017-01-15 ENCOUNTER — Other Ambulatory Visit: Payer: Self-pay | Admitting: Physician Assistant

## 2017-01-15 MED ORDER — WARFARIN SODIUM 5 MG PO TABS: ORAL_TABLET | ORAL | 3 refills | Status: DC

## 2017-01-15 NOTE — Telephone Encounter (Signed)
[facility] pharmacy faxed a refill request for the following medication. Thanks CC  warfarin (COUMADIN) 5 MG tablet

## 2017-01-25 ENCOUNTER — Ambulatory Visit (INDEPENDENT_AMBULATORY_CARE_PROVIDER_SITE_OTHER): Payer: Medicare Other | Admitting: Family Medicine

## 2017-01-25 ENCOUNTER — Encounter: Payer: Self-pay | Admitting: Family Medicine

## 2017-01-25 VITALS — BP 108/66 | HR 58 | Temp 98.0°F | Resp 16 | Ht 74.0 in | Wt 215.2 lb

## 2017-01-25 DIAGNOSIS — Z1159 Encounter for screening for other viral diseases: Secondary | ICD-10-CM

## 2017-01-25 DIAGNOSIS — I779 Disorder of arteries and arterioles, unspecified: Secondary | ICD-10-CM

## 2017-01-25 DIAGNOSIS — E785 Hyperlipidemia, unspecified: Secondary | ICD-10-CM

## 2017-01-25 DIAGNOSIS — N400 Enlarged prostate without lower urinary tract symptoms: Secondary | ICD-10-CM

## 2017-01-25 DIAGNOSIS — Z0000 Encounter for general adult medical examination without abnormal findings: Secondary | ICD-10-CM | POA: Diagnosis not present

## 2017-01-25 DIAGNOSIS — I714 Abdominal aortic aneurysm, without rupture, unspecified: Secondary | ICD-10-CM

## 2017-01-25 DIAGNOSIS — Z1211 Encounter for screening for malignant neoplasm of colon: Secondary | ICD-10-CM

## 2017-01-25 DIAGNOSIS — Z23 Encounter for immunization: Secondary | ICD-10-CM | POA: Diagnosis not present

## 2017-01-25 DIAGNOSIS — I82409 Acute embolism and thrombosis of unspecified deep veins of unspecified lower extremity: Secondary | ICD-10-CM | POA: Diagnosis not present

## 2017-01-25 DIAGNOSIS — I10 Essential (primary) hypertension: Secondary | ICD-10-CM | POA: Diagnosis not present

## 2017-01-25 DIAGNOSIS — I739 Peripheral vascular disease, unspecified: Secondary | ICD-10-CM | POA: Diagnosis not present

## 2017-01-25 LAB — POCT INR
INR: 3.1
PT: 37.6

## 2017-01-25 NOTE — Progress Notes (Signed)
Patient: [name] [name], Male    DOB: Mar 01, 1945, 71 y.o.   MRN: [id number] Visit Date: 01/25/2017  Today's Provider: Dortha Kern, PA   Chief Complaint  Patient presents with  . Annual Exam   Subjective:    Annual physical exam [name] [name] is a 71 y.o. male who presents today for health maintenance and complete physical. He feels fairly well. He reports exercising lightly walking and house work. He reports he is sleeping well.  ----------------------------------------------------------------- Patient had a AWE on 12/28/16. Colonoscopy: 02/15/2006- Due Tetanus: 12/17/2013 Flu: 11/30/16  Review of Systems  Constitutional: Negative.   HENT: Negative.   Eyes: Negative.   Respiratory: Negative.   Cardiovascular: Negative.   Gastrointestinal: Negative.   Endocrine: Negative.   Genitourinary: Negative.   Musculoskeletal: Negative.   Skin: Negative.   Allergic/Immunologic: Negative.   Neurological: Positive for numbness.  Hematological: Negative.   Psychiatric/Behavioral: Negative.     Social History      He  reports that he quit smoking about 31 years ago. His smoking use included cigarettes. He quit after 15.00 years of use. His smokeless tobacco use includes chew. He reports that he drinks alcohol. He reports that he does not use drugs.       Social History   Socioeconomic History  . Marital status: Married    Spouse name: None  . Number of children: None  . Years of education: None  . Highest education level: None  Social Needs  . Financial resource strain: None  . Food insecurity - worry: None  . Food insecurity - inability: None  . Transportation needs - medical: None  . Transportation needs - non-medical: None  Occupational History  . Occupation: Retired  Tobacco Use  . Smoking status: Former Smoker    Years: 15.00    Types: Cigarettes    Last attempt to quit: 09/21/1985    Years since quitting: 31.3  . Smokeless tobacco: Current User      Types: Chew  Substance and Sexual Activity  . Alcohol use: Yes    Comment: 1 beer ocassionally (mostly in summer)  . Drug use: No  . Sexual activity: None  Other Topics Concern  . None  Social History Narrative   Lives with family.   Past Medical History:  Diagnosis Date  . Abdominal aortic aneurysm (HCC)   . Chronic kidney disease    Right kidney stone  . Coronary artery disease   . Detached retina   . Hyperlipidemia   . Hypertension   . Myocardial infarction [facility])    2007 heart stent  . Popliteal aneurysm [facility])    Patient Active Problem List   Diagnosis Date Noted  . Pseudoaphakia 08/13/2014  . Posterior vitreous detachment 08/13/2014  . Detached retina 08/13/2014  . Cataract 08/13/2014  . Ischemic foot 11/30/2013  . PAD (peripheral artery disease) (HCC) 10/29/2013  . Bradycardia 10/25/2013  . Lower limb ischemia 10/23/2013  . Popliteal artery occlusion, left (HCC) 10/23/2013  . Redness of skin-Left leg/foot 10/21/2013  . Paresthesia of left foot 10/21/2013  . Sensation of cold in lower extremity-Left foot 10/21/2013  . Pain of left lower leg 10/21/2013  . PVD (peripheral vascular disease) (HCC) 08/03/2013  . AAA (abdominal aortic aneurysm) (HCC) 06/23/2013  . HTN (hypertension) 06/23/2013  . Popliteal aneurysm (HCC) 07/28/2012  . Aneurysm of artery of lower extremity (HCC) 12/03/2011  . Aneurysm of abdominal vessel (HCC) 05/28/2011  . Hyperlipidemia 10/11/2009  . CORONARY  ATHEROSCLEROSIS NATIVE CORONARY ARTERY 10/11/2009  . ABDOMINAL BRUIT 10/11/2009  . OTHER DYSPNEA AND RESPIRATORY ABNORMALITIES 10/11/2009   Past Surgical History:  Procedure Laterality Date  . ABDOMINAL AORTIC ANEURYSM REPAIR  11/24/09   EVAR  . AMPUTATION Left 12/10/2013   Procedure: AMPUTATION LEFT  FOURTH AND FIFTH TOES;  Surgeon: Nada Libman, MD;  Location: [facility] OR;  Service: Vascular;  Laterality: Left;  . COLONOSCOPY    . coronary stenting     2007  . EYE SURGERY Right     cataract removed  . FEMORAL-POPLITEAL BYPASS GRAFT Left 10/29/2013   Procedure: BYPASS GRAFT FEMORAL-POPLITEAL ARTERY;  Surgeon: Nada Libman, MD;  Location: [facility] OR;  Service: Vascular;  Laterality: Left;  Left Femoral to Posterior Tibial artery bypass graft using nonreversed saphenous vein. and thrombectomy of Tibial arterly.  . FEMORAL-TIBIAL BYPASS GRAFT Left 10/30/2013   Procedure: Reexploration of Femoral-Posterior Tibial Bypass; Thrombectomy of Bypass; Left Leg Angiogram; Posterior Tibial Vein Patch Angioplasty;  Surgeon: Nada Libman, MD;  Location: [facility] OR;  Service: Vascular;  Laterality: Left;  . LOWER EXTREMITY ANGIOGRAM N/A 05/25/2014   Procedure: LOWER EXTREMITY ANGIOGRAM;  Surgeon: Nada Libman, MD;  Location: [facility] CATH LAB;  Service: Cardiovascular;  Laterality: N/A;  . PERIPHERAL VASCULAR CATHETERIZATION Left 10/04/2015   Procedure: Lower Extremity Angiography;  Surgeon: Nada Libman, MD;  Location: [facility] INVASIVE CV LAB;  Service: Cardiovascular;  Laterality: Left;  . PERIPHERAL VASCULAR CATHETERIZATION Left 10/04/2015   Procedure: Peripheral Vascular Intervention;  Surgeon: Nada Libman, MD;  Location: [facility] INVASIVE CV LAB;  Service: Cardiovascular;  Laterality: Left;  . POPLITEAL ARTERY STENT  11/2009   left popliteal artery   . RETINAL DETACHMENT SURGERY Right 2011  . TOOTH EXTRACTION  June 2015   Wisdom Tooth Extraction   Family History        Family Status  Relation Name Status  . Mother  Deceased  . Father  Deceased  . Sister  (Not Specified)  . Brother  (Not Specified)        His family history includes Aneurysm in his sister; Peripheral vascular disease in his brother.     Allergies  Allergen Reactions  . Omnipaque [Iohexol] Rash    Pt requires full premeds and needs to bring a driver.  Broke out in rash on back and arms.  Ok w/ premeds  . Oxytetracycline Rash    Terramycin causes rash  . Alteplase Other (See Comments)    Internal bleeding, per RN in short  stay. Patient requested this to be added to his allergy list    Current Outpatient Medications:  .  atorvastatin (LIPITOR) 40 MG tablet, TAKE ONE TABLET BY MOUTH ONCE DAILY *PLEASE  KEEP  NOVEMBER  APPOINTMENT  FOR  FURTHER  REFILLS*, Disp: 90 tablet, Rfl: 3 .  metoprolol tartrate (LOPRESSOR) 25 MG tablet, TAKE ONE TABLET BY MOUTH TWICE DAILY, Disp: 60 tablet, Rfl: 6 .  Multiple Vitamin (MULTIVITAMIN WITH MINERALS) TABS tablet, Take 1 tablet by mouth daily., Disp: , Rfl:  .  Omega-3 Fatty Acids (FISH OIL TRIPLE STRENGTH) 1400 MG CAPS, Take 2,800 mg by mouth 2 (two) times daily., Disp: , Rfl:  .  Saw Palmetto 450 MG CAPS, Take 900 mg by mouth 2 (two) times daily., Disp: , Rfl:  .  warfarin (COUMADIN) 5 MG tablet, Take 2 tablets by mouth daily except 1.5 tablets on Monday and Friday., Disp: 150 tablet, Rfl: 3   Patient Care Team: Tamsen Roers,  PA as PCP - General (Family Medicine) Tonny Bollmanooper, Michael, MD (Cardiology) Nada LibmanBrabham, Vance W, MD as Consulting Physician (Vascular Surgery)      Objective:   Vitals: BP 108/66 (BP Location: Right Arm, Patient Position: Sitting, Cuff Size: Normal)   Pulse (!) 58   Temp 98 F (36.7 C) (Oral)   Resp 16   Ht 6\' 2"  (1.88 m)   Wt 215 lb 3.2 oz (97.6 kg)   SpO2 98%   BMI 27.63 kg/m  Wt Readings from Last 3 Encounters:  01/25/17 215 lb 3.2 oz (97.6 kg)  12/31/16 212 lb 12.8 oz (96.5 kg)  12/28/16 217 lb (98.4 kg)    Physical Exam  Constitutional: He is oriented to person, place, and time. He appears well-developed and well-nourished.  HENT:  Head: Normocephalic and atraumatic.  Right Ear: External ear normal.  Left Ear: External ear normal.  Nose: Nose normal.  Mouth/Throat: Oropharynx is clear and moist.  Edentulous - compensated.  Eyes: Conjunctivae and EOM are normal. Pupils are equal, round, and reactive to light. Right eye exhibits no discharge.  Poor vision in right eye since retinal detachment in 2011.   Neck: Normal range of motion.  Neck supple. No tracheal deviation present. No thyromegaly present.  Cardiovascular: Normal rate, regular rhythm, normal heart sounds and intact distal pulses.  No murmur heard. Pulmonary/Chest: Effort normal and breath sounds normal. No respiratory distress. He has no wheezes. He has no rales. He exhibits no tenderness.  Abdominal: Soft. He exhibits no distension and no mass. There is no tenderness. There is no rebound and no guarding.  Musculoskeletal: Normal range of motion. He exhibits no edema or tenderness.  Warm extremities. Well healed scars right leg from past DVT and arterial occlusive disease. Well healed right foot from amputation of 4th and 5th toes. Decreased sensation/numbness in right foot. Well healed scar left inner leg secondary to arterial bypass surgery.  Lymphadenopathy:    He has no cervical adenopathy.  Neurological: He is alert and oriented to person, place, and time. He has normal reflexes. No cranial nerve deficit. He exhibits normal muscle tone. Coordination normal.  Skin: Skin is warm and dry. No rash noted. No erythema.  Psychiatric: He has a normal mood and affect. His behavior is normal. Judgment and thought content normal.    Depression Screen PHQ 2/9 Scores 12/28/2016 11/25/2015  PHQ - 2 Score 0 0   Assessment & Plan:     Routine Health Maintenance and Physical Exam  Exercise Activities and Dietary recommendations Goals    . DIET - INCREASE WATER INTAKE     Recommend increasing water intake to 4-6 glasses a day.       Immunization History  Administered Date(s) Administered  . Influenza, High Dose Seasonal PF 01/14/2015, 11/25/2015, 11/30/2016  . Tdap 12/17/2013    Health Maintenance  Topic Date Due  . Hepatitis C Screening  [birth year]  . PNA vac Low Risk Adult (1 of 2 - PCV13) 05/15/2008  . COLONOSCOPY  02/16/2016  . TETANUS/TDAP  12/18/2023  . INFLUENZA VACCINE  Completed     Discussed health benefits of physical activity, and  encouraged him to engage in regular exercise appropriate for his age and condition.    --------------------------------------------------------------------  1. Encounter for Medicare annual wellness exam General health stable. Given Prevnar-13 today. Declines Zoster vaccination. Continues to work on lowering weight with diet and exercise. (Has lost 5 lbs in the past 4 weeks). Given anticipatory counseling.   2. Need for  hepatitis C screening test - Hepatitis C Antibody  3. Colon cancer screening Had colonoscopy by Dr. Lemar LivingsByrnett (surgeon) 02-15-06 with only a few large mouthed diverticula. No polyps or growths. Declines repeat colonoscopy. Agrees to Cologuard q 3 years if no signs of occult blood or need for repeat colonoscopy. - Cologuard  4. Deep vein thrombosis (DVT) (HCC) Presently on Coumadin 10 mg qd except 7.5 mg M,W,F. Protime INR 3.1 today. May have a leafy, green salad today and continue present dosage. Recheck protime in 1 month. - POCT INR  5. Essential hypertension Well controlled BP on Metoprolol Tartrate 25 mg BID. Recheck labs and follow up pending reports.  - TSH - CBC with Differential - Comprehensive metabolic panel  6. PAD (peripheral artery disease) (HCC) Stable since femoral to posterior tibial artery bypass graft 10-29-13 that required balloon angioplasty on 05-25-14 and drug coated balloon angioplasty with angiasculpt August 2017 by Dr. Myra GianottiBrabham (vascular surgeon).  7. Hyperlipidemia, unspecified hyperlipidemia type Tolerating Atorvastatin 40 mg qd without side effects. Continue2 Omega-3 Fish Oil 1400 mg 2 BID and low fat diet. Recheck lipids today. - Lipid Profile  8. Aneurysm of abdominal vessel (HCC) Repaired 19-13-11 and followed by vascular specilist (Dr. Myra GianottiBrabham) routinely.  9. Prostate enlargement Nocturia only once a night. Still using Saw Palmetto without side effects. Recheck PSA. - PSA   Dortha Kernennis Jing Howatt, PA  [facility]

## 2017-01-26 LAB — CBC WITH DIFFERENTIAL/PLATELET
BASOS PCT: 0.6 %
Basophils Absolute: 44 cells/uL (ref 0–200)
EOS ABS: 285 {cells}/uL (ref 15–500)
Eosinophils Relative: 3.9 %
HCT: 46.3 % (ref 38.5–50.0)
Hemoglobin: 16.2 g/dL (ref 13.2–17.1)
Lymphs Abs: 1518 cells/uL (ref 850–3900)
MCH: 32.1 pg (ref 27.0–33.0)
MCHC: 35 g/dL (ref 32.0–36.0)
MCV: 91.9 fL (ref 80.0–100.0)
MONOS PCT: 8.7 %
MPV: 9.2 fL (ref 7.5–12.5)
Neutro Abs: 4818 cells/uL (ref 1500–7800)
Neutrophils Relative %: 66 %
PLATELETS: 224 10*3/uL (ref 140–400)
RBC: 5.04 10*6/uL (ref 4.20–5.80)
RDW: 13 % (ref 11.0–15.0)
TOTAL LYMPHOCYTE: 20.8 %
WBC mixed population: 635 cells/uL (ref 200–950)
WBC: 7.3 10*3/uL (ref 3.8–10.8)

## 2017-01-26 LAB — COMPLETE METABOLIC PANEL WITH GFR
AG Ratio: 1.6 (calc) (ref 1.0–2.5)
ALKALINE PHOSPHATASE (APISO): 55 U/L (ref 40–115)
ALT: 36 U/L (ref 9–46)
AST: 33 U/L (ref 10–35)
Albumin: 4.4 g/dL (ref 3.6–5.1)
BILIRUBIN TOTAL: 1.9 mg/dL — AB (ref 0.2–1.2)
BUN: 14 mg/dL (ref 7–25)
CHLORIDE: 107 mmol/L (ref 98–110)
CO2: 27 mmol/L (ref 20–32)
Calcium: 9.4 mg/dL (ref 8.6–10.3)
Creat: 1.18 mg/dL (ref 0.70–1.18)
GFR, Est African American: 71 mL/min/{1.73_m2} (ref >=60)
GFR, Est Non African American: 61 mL/min/{1.73_m2} (ref >=60)
GLUCOSE: 106 mg/dL — AB (ref 65–99)
Globulin: 2.7 g/dL (calc) (ref 1.9–3.7)
POTASSIUM: 4.7 mmol/L (ref 3.5–5.3)
Sodium: 140 mmol/L (ref 135–146)
TOTAL PROTEIN: 7.1 g/dL (ref 6.1–8.1)

## 2017-01-26 LAB — HEPATITIS C ANTIBODY
Hepatitis C Ab: NONREACTIVE
SIGNAL TO CUT-OFF: 0.05 (ref <1.00)

## 2017-01-26 LAB — TSH: TSH: 3.8 mIU/L (ref 0.40–4.50)

## 2017-01-26 LAB — LIPID PANEL
CHOL/HDL RATIO: 4 (calc) (ref <5.0)
Cholesterol: 135 mg/dL (ref <200)
HDL: 34 mg/dL — AB (ref >40)
LDL CHOLESTEROL (CALC): 73 mg/dL
NON-HDL CHOLESTEROL (CALC): 101 mg/dL (ref <130)
Triglycerides: 187 mg/dL — ABNORMAL HIGH (ref <150)

## 2017-01-26 LAB — PSA: PSA: 6.4 ng/mL — AB (ref <=4.0)

## 2017-01-28 ENCOUNTER — Other Ambulatory Visit (HOSPITAL_COMMUNITY): Payer: Medicare Other

## 2017-01-28 ENCOUNTER — Telehealth: Payer: Self-pay

## 2017-01-28 ENCOUNTER — Ambulatory Visit: Payer: Medicare Other | Admitting: Surgery

## 2017-01-28 ENCOUNTER — Encounter (HOSPITAL_COMMUNITY): Payer: Medicare Other

## 2017-01-28 NOTE — Telephone Encounter (Signed)
-----   Message from Tamsen Roersennis E [name], [place] sent at 01/26/2017  9:46 AM EST ----- HDL cholesterol improving but still low. Good LDL level - getting close to the goal of 70. Triglycerides a little higher than last check 6 months ago. Continue Atorvastatin (Lipitor) 40 mg qd and add Co-Q 10 100 mg qd to the Omega-3 Fish Oil 1000 mg BID. PSA high - if having any troubles with urinary frequency, difficulty getting urine to flow, getting up at night to urinate or leakage, will need referral to urologist.

## 2017-01-28 NOTE — Telephone Encounter (Signed)
Recheck PSA and prostate in 6 months.

## 2017-01-28 NOTE — Telephone Encounter (Signed)
Advised patient of results. Patient reports that he does have urinary frequency, but he discussed the severity of it with you when he came in the other day. He is ok with just monitoring his PSA levels. He does not want a referral right now. How often should he have this checked? Please advise. Thanks!

## 2017-01-29 NOTE — Telephone Encounter (Signed)
Left message to call back  

## 2017-01-29 NOTE — Telephone Encounter (Signed)
Patient advised as below.  

## 2017-01-30 ENCOUNTER — Ambulatory Visit: Payer: Self-pay

## 2017-01-31 ENCOUNTER — Telehealth: Payer: Self-pay | Admitting: Family Medicine

## 2017-01-31 NOTE — Telephone Encounter (Signed)
Order for cologuard faxed to [facility] °

## 2017-02-18 ENCOUNTER — Telehealth: Payer: Self-pay

## 2017-02-18 NOTE — Telephone Encounter (Signed)
Yes, he can hold the ASA. Need to recheck protime next week to be sure blood is not too thin.

## 2017-02-18 NOTE — Telephone Encounter (Signed)
Pt is taking ibuprofen 200 mg once daily for back pain, and has D/C his ASA 81 mg po qd due to taking NSAID. Can pt hold ASA while taking IBU?

## 2017-02-18 NOTE — Telephone Encounter (Signed)
Patient advised. He has a appointment for PT check on 02/22/17.

## 2017-02-20 DIAGNOSIS — Z1211 Encounter for screening for malignant neoplasm of colon: Secondary | ICD-10-CM | POA: Diagnosis not present

## 2017-02-22 ENCOUNTER — Ambulatory Visit (INDEPENDENT_AMBULATORY_CARE_PROVIDER_SITE_OTHER): Payer: Medicare Other

## 2017-02-22 DIAGNOSIS — I739 Peripheral vascular disease, unspecified: Secondary | ICD-10-CM | POA: Diagnosis not present

## 2017-02-22 LAB — POCT INR
INR: 2.6
PT: 31.6

## 2017-02-22 LAB — COLOGUARD: COLOGUARD: NEGATIVE

## 2017-02-22 NOTE — Progress Notes (Signed)
Good level. Continue present Coumadin dosage and recheck in 1 month.

## 2017-02-22 NOTE — Patient Instructions (Signed)
Description   Continue 10 mg daily except for 7.5 mg on M and F. F/U 4 weeks

## 2017-03-11 ENCOUNTER — Ambulatory Visit (INDEPENDENT_AMBULATORY_CARE_PROVIDER_SITE_OTHER): Payer: Medicare Other | Admitting: Surgery

## 2017-03-11 ENCOUNTER — Other Ambulatory Visit: Payer: Self-pay | Admitting: Surgery

## 2017-03-11 ENCOUNTER — Ambulatory Visit (HOSPITAL_COMMUNITY)
Admission: RE | Admit: 2017-03-11 | Discharge: 2017-03-11 | Disposition: A | Discharge status: Home / Self Care | Payer: Medicare Other | Source: Ambulatory Visit | Attending: Surgery | Admitting: Surgery

## 2017-03-11 ENCOUNTER — Encounter: Payer: Self-pay | Admitting: Surgery

## 2017-03-11 ENCOUNTER — Ambulatory Visit (INDEPENDENT_AMBULATORY_CARE_PROVIDER_SITE_OTHER)
Admission: RE | Admit: 2017-03-11 | Discharge: 2017-03-11 | Disposition: A | Discharge status: Home / Self Care | Payer: Medicare Other | Source: Ambulatory Visit | Attending: Surgery | Admitting: Surgery

## 2017-03-11 VITALS — BP 104/66 | HR 73 | Temp 97.6°F | Resp 16 | Ht 74.0 in | Wt 210.0 lb

## 2017-03-11 DIAGNOSIS — I714 Abdominal aortic aneurysm, without rupture, unspecified: Secondary | ICD-10-CM

## 2017-03-11 DIAGNOSIS — I724 Aneurysm of artery of lower extremity: Secondary | ICD-10-CM

## 2017-03-11 DIAGNOSIS — I739 Peripheral vascular disease, unspecified: Secondary | ICD-10-CM

## 2017-03-11 NOTE — Progress Notes (Signed)
[facility] Specialist of [place]  Patient name: [name] [name] Martha MRN: [id number] DOB: 04/16/1945 Sex: male   REASON FOR VISIT:    Follow up  [name]:   The patient comes in today for followup. He initially underwent endovascular abdominal aortic aneurysm repair in October of 2011. He has a known type II endoleak, however his aneurysm continues to decrease. He is also status post endovascular repair of a left popliteal artery aneurysm on October 2011.  He presented to the office on 10/21/2013 with a six-day history of pain and coldness in his left foot. He was found to have an occluded popliteal stent by angiography the following day. An attempt at thrombolysis was performed, however the patient developed some cardiac issues as well as bleeding, and this had to be discontinued. The patient continued to have an ischemic foot and therefore he was taken to the operating room for revascularization. On 10/29/2013, he underwent femoral to posterior tibial artery bypass graft. He had an excellent caliber vein. The bypass graft occluded early and he was taken back. The bypass was able to be re-opened. I explored the posterior tibial artery behind the ankle. I passed catheters all the way into the pedal arch and injected TPA. The patient was transitioned to oral anticoagulation and ultimately discharged. While he was in the hospital . He developed bypass graft stenosis and on 05/25/2014 he underwent drug coated balloon angioplasty to the distal portion of his bypass graft.He had recurrent stenosis within the distal vein graft and in August 2017 he underwent drug coated balloon angioplasty withangiasculpt. He has known occlusion of all 3 tibial vessels.  He has no complaints today   PAST MEDICAL HISTORY:   Past Medical History:  Diagnosis Date  . Abdominal aortic aneurysm (HCC)   . Chronic kidney disease    Right kidney stone  .  Coronary artery disease   . Detached retina   . Hyperlipidemia   . Hypertension   . Myocardial infarction [facility](HCC)    2007 heart stent  . Popliteal aneurysm (HCC)      FAMILY HISTORY:   Family History  Problem Relation Age of Onset  . Aneurysm Sister        possibly   . Peripheral vascular disease Brother     SOCIAL HISTORY:   Social History   Tobacco Use  . Smoking status: Former Smoker    Years: 15.00    Types: Cigarettes    Last attempt to quit: 09/21/1985    Years since quitting: 31.4  . Smokeless tobacco: Current User    Types: Chew  Substance Use Topics  . Alcohol use: Yes    Comment: 1 beer ocassionally (mostly in summer)     ALLERGIES:   Allergies  Allergen Reactions  . Omnipaque [Iohexol] Rash    Pt requires full premeds and needs to bring a driver.  Broke out in rash on back and arms.  Ok w/ premeds  . Oxytetracycline Rash    Terramycin causes rash  . Alteplase Other (See Comments)    Internal bleeding, per RN in short stay. Patient requested this to be added to his allergy list     CURRENT MEDICATIONS:   Current Outpatient Medications  Medication Sig Dispense Refill  . atorvastatin (LIPITOR) 40 MG tablet TAKE ONE TABLET BY MOUTH ONCE DAILY *PLEASE  KEEP  NOVEMBER  APPOINTMENT  FOR  FURTHER  REFILLS* 90 tablet 3  . metoprolol tartrate (LOPRESSOR) 25 MG tablet TAKE  ONE TABLET BY MOUTH TWICE DAILY 60 tablet 6  . Multiple Vitamin (MULTIVITAMIN WITH MINERALS) TABS tablet Take 1 tablet by mouth daily.    . Omega-3 Fatty Acids (FISH OIL TRIPLE STRENGTH) 1400 MG CAPS Take 2,800 mg by mouth 2 (two) times daily.    . Saw Palmetto 450 MG CAPS Take 900 mg by mouth 2 (two) times daily.    Marland Kitchen warfarin (COUMADIN) 5 MG tablet Take 2 tablets by mouth daily except 1.5 tablets on Monday and Friday. 150 tablet 3   No current facility-administered medications for this visit.     REVIEW OF SYSTEMS:   [X]  denotes positive finding, [ ]  denotes negative finding Cardiac   Comments:  Chest pain or chest pressure:    Shortness of breath upon exertion:    Short of breath when lying flat:    Irregular heart rhythm:        Vascular    Pain in calf, thigh, or hip brought on by ambulation:    Pain in feet at night that wakes you up from your sleep:     Blood clot in your veins:    Leg swelling:         Pulmonary    Oxygen at home:    Productive cough:     Wheezing:         Neurologic    Sudden weakness in arms or legs:     Sudden numbness in arms or legs:     Sudden onset of difficulty speaking or slurred speech:    Temporary loss of vision in one eye:     Problems with dizziness:         Gastrointestinal    Blood in stool:     Vomited blood:         Genitourinary    Burning when urinating:     Blood in urine:        Psychiatric    Major depression:         Hematologic    Bleeding problems:    Problems with blood clotting too easily:        Skin    Rashes or ulcers:        Constitutional    Fever or chills:      PHYSICAL EXAM:   Vitals:   03/11/17 0930  BP: 104/66  Pulse: 73  Resp: 16  Temp: 97.6 F (36.4 C)  TempSrc: Oral  SpO2: 97%  Weight: 210 lb (95.3 kg)  Height: 6\' 2"  (1.88 m)    GENERAL: The patient is a well-nourished male, in no acute distress. The vital signs are documented above. CARDIAC: There is a regular rate and rhythm.  VASCULAR: Palpable right dorsalis pedis pulse.  Nonpalpable left pedal pulse PULMONARY: Non-labored respirations ABDOMEN: Soft and non-tender with normal pitched bowel sounds.  MUSCULOSKELETAL: There are no major deformities or cyanosis. NEUROLOGIC: No focal weakness or paresthesias are detected. SKIN: Healed left toe amputation sites PSYCHIATRIC: The patient has a normal affect.  STUDIES:   I have ordered and reviewed his duplex studies with the following results AAA: Decrease in sac size with maximum diameter of 4.1, down from 5.1 Duplex: Patent left leg bypass graft.  Velocities  are 199 which is up from 179 6 months ago  MEDICAL ISSUES:   AAA: The patient will follow-up in 6 months for repeat ultrasound imaging Left leg bypass: I am monitoring a stenosis.  There is been minimal change over the past  6 months.  He will follow-up in 6 months with a repeat duplex.  He is contemplating getting a second opinion at [facility]  I have encouraged him to pursue this if he wants.    Durene Cal, MD [facility] [phone] Pager [phone]

## 2017-03-12 NOTE — Addendum Note (Signed)
Addended by: Burton ApleyPETTY, Mariely Mahr A on: 03/12/2017 03:23 PM   Modules accepted: Orders

## 2017-03-22 ENCOUNTER — Ambulatory Visit (INDEPENDENT_AMBULATORY_CARE_PROVIDER_SITE_OTHER): Payer: Medicare Other

## 2017-03-22 DIAGNOSIS — I739 Peripheral vascular disease, unspecified: Secondary | ICD-10-CM | POA: Diagnosis not present

## 2017-03-22 LAB — POCT INR
INR: 2.8
PT: 33.8

## 2017-03-22 NOTE — Patient Instructions (Signed)
Description   Continue 10 mg daily except for 7.5 mg on M and F. F/U 4 weeks     

## 2017-03-22 NOTE — Progress Notes (Signed)
Agree with plan to continue same Coumadin dosage (10 mg qd except 7.5 mg on M & F) and recheck protime in a month.

## 2017-04-19 ENCOUNTER — Ambulatory Visit (INDEPENDENT_AMBULATORY_CARE_PROVIDER_SITE_OTHER): Payer: Medicare Other | Admitting: *Deleted

## 2017-04-19 DIAGNOSIS — I739 Peripheral vascular disease, unspecified: Secondary | ICD-10-CM

## 2017-04-19 LAB — POCT INR
INR: 3.4
PT: 40.5

## 2017-04-19 NOTE — Patient Instructions (Signed)
Skip your next dose and then resume current dose. Take 10 mg daily except for 7.5 mg on M and F. F/U 2 weeks

## 2017-04-30 ENCOUNTER — Other Ambulatory Visit: Payer: Self-pay | Admitting: Family Medicine

## 2017-05-03 ENCOUNTER — Ambulatory Visit (INDEPENDENT_AMBULATORY_CARE_PROVIDER_SITE_OTHER): Payer: Medicare Other

## 2017-05-03 DIAGNOSIS — I739 Peripheral vascular disease, unspecified: Secondary | ICD-10-CM

## 2017-05-03 LAB — POCT INR
INR: 3
PT: 36.5

## 2017-05-03 NOTE — Progress Notes (Signed)
Good INR level. Continue present dosage schedule and recheck protime/INR in a month.

## 2017-05-03 NOTE — Progress Notes (Signed)
Pt made 4 week FU for this.

## 2017-05-31 ENCOUNTER — Ambulatory Visit (INDEPENDENT_AMBULATORY_CARE_PROVIDER_SITE_OTHER): Payer: Medicare Other

## 2017-05-31 DIAGNOSIS — I739 Peripheral vascular disease, unspecified: Secondary | ICD-10-CM | POA: Diagnosis not present

## 2017-05-31 LAB — POCT INR
INR: 3.8
PT: 45.8

## 2017-05-31 NOTE — Patient Instructions (Signed)
Description   Hold Sat dose the resume 10 mg daily except 7.5 mg on M and F. F/Y in 2 weeks

## 2017-06-14 ENCOUNTER — Other Ambulatory Visit (INDEPENDENT_AMBULATORY_CARE_PROVIDER_SITE_OTHER): Payer: Medicare Other | Admitting: Emergency Medicine

## 2017-06-14 ENCOUNTER — Ambulatory Visit (INDEPENDENT_AMBULATORY_CARE_PROVIDER_SITE_OTHER): Payer: Medicare Other | Admitting: Emergency Medicine

## 2017-06-14 DIAGNOSIS — I739 Peripheral vascular disease, unspecified: Secondary | ICD-10-CM

## 2017-06-14 LAB — POCT INR
INR: 3.3
INR: 3.3
PT: 40.2
PT: 40.2

## 2017-07-05 ENCOUNTER — Ambulatory Visit (INDEPENDENT_AMBULATORY_CARE_PROVIDER_SITE_OTHER): Payer: Medicare Other

## 2017-07-05 DIAGNOSIS — I739 Peripheral vascular disease, unspecified: Secondary | ICD-10-CM

## 2017-07-05 LAB — POCT INR
INR: 5 — AB (ref 2.0–3.0)
PT: 59.5

## 2017-07-19 ENCOUNTER — Ambulatory Visit (INDEPENDENT_AMBULATORY_CARE_PROVIDER_SITE_OTHER): Payer: Medicare Other | Admitting: Emergency Medicine

## 2017-07-19 DIAGNOSIS — I739 Peripheral vascular disease, unspecified: Secondary | ICD-10-CM | POA: Diagnosis not present

## 2017-07-19 LAB — POCT INR: INR: 3.8 — AB (ref 2.0–3.0)

## 2017-07-19 NOTE — Patient Instructions (Addendum)
Description   Hold 2 days, and continue 10 mg daily except 7.5 mg on M and F recheck in 3 weeks.

## 2017-08-02 ENCOUNTER — Ambulatory Visit (INDEPENDENT_AMBULATORY_CARE_PROVIDER_SITE_OTHER): Payer: Medicare Other

## 2017-08-02 DIAGNOSIS — I739 Peripheral vascular disease, unspecified: Secondary | ICD-10-CM | POA: Diagnosis not present

## 2017-08-02 LAB — POCT INR
INR: 3.9 — AB (ref 2.0–3.0)
PT: 46.3

## 2017-08-02 NOTE — Patient Instructions (Signed)
Description   Eat more leafy green vegatables. Hold for 2 day then 10 mg daily except 7.5 mg on M and F recheck in 2 weeks.

## 2017-08-09 ENCOUNTER — Ambulatory Visit: Payer: Self-pay | Admitting: Family Medicine

## 2017-08-16 ENCOUNTER — Other Ambulatory Visit: Payer: Self-pay | Admitting: Family Medicine

## 2017-08-16 ENCOUNTER — Ambulatory Visit (INDEPENDENT_AMBULATORY_CARE_PROVIDER_SITE_OTHER): Payer: Medicare Other

## 2017-08-16 DIAGNOSIS — I10 Essential (primary) hypertension: Secondary | ICD-10-CM

## 2017-08-16 DIAGNOSIS — E782 Mixed hyperlipidemia: Secondary | ICD-10-CM

## 2017-08-16 DIAGNOSIS — E785 Hyperlipidemia, unspecified: Secondary | ICD-10-CM

## 2017-08-16 DIAGNOSIS — I739 Peripheral vascular disease, unspecified: Secondary | ICD-10-CM

## 2017-08-16 LAB — POCT INR
INR: 3.5 — AB (ref 2.0–3.0)
PT: 42.3

## 2017-08-16 NOTE — Patient Instructions (Signed)
Description   10 mg daily except 7.5 mg on M,W and F recheck in 2 weeks.

## 2017-08-16 NOTE — Progress Notes (Signed)
Follow up labs for CMP, Lipid panel and CBC to recheck HBP, PAD and Hyperlipidemia. Schedule appointment and recheck of INR in 2 weeks.

## 2017-08-17 LAB — CBC WITH DIFFERENTIAL/PLATELET
Basophils Absolute: 0 10*3/uL (ref 0.0–0.2)
Basos: 1 %
EOS (ABSOLUTE): 0.4 10*3/uL (ref 0.0–0.4)
Eos: 6 %
Hematocrit: 48 % (ref 37.5–51.0)
Hemoglobin: 16.2 g/dL (ref 13.0–17.7)
Immature Grans (Abs): 0 10*3/uL (ref 0.0–0.1)
Immature Granulocytes: 0 %
LYMPHS ABS: 1.9 10*3/uL (ref 0.7–3.1)
Lymphs: 33 %
MCH: 31.8 pg (ref 26.6–33.0)
MCHC: 33.8 g/dL (ref 31.5–35.7)
MCV: 94 fL (ref 79–97)
MONOCYTES: 9 %
MONOS ABS: 0.5 10*3/uL (ref 0.1–0.9)
Neutrophils Absolute: 3.1 10*3/uL (ref 1.4–7.0)
Neutrophils: 51 %
Platelets: 203 10*3/uL (ref 150–450)
RBC: 5.1 x10E6/uL (ref 4.14–5.80)
RDW: 14.3 % (ref 12.3–15.4)
WBC: 5.9 10*3/uL (ref 3.4–10.8)

## 2017-08-17 LAB — COMPREHENSIVE METABOLIC PANEL
ALK PHOS: 62 IU/L (ref 39–117)
ALT: 37 IU/L (ref 0–44)
AST: 34 IU/L (ref 0–40)
Albumin/Globulin Ratio: 1.7 (ref 1.2–2.2)
Albumin: 4.3 g/dL (ref 3.5–4.8)
BUN / CREAT RATIO: 11 (ref 10–24)
BUN: 13 mg/dL (ref 8–27)
Bilirubin Total: 1.6 mg/dL — ABNORMAL HIGH (ref 0.0–1.2)
CHLORIDE: 109 mmol/L — AB (ref 96–106)
CO2: 19 mmol/L — AB (ref 20–29)
CREATININE: 1.2 mg/dL (ref 0.76–1.27)
Calcium: 9.3 mg/dL (ref 8.6–10.2)
GFR calc Af Amer: 68 mL/min/{1.73_m2} (ref >59)
GFR calc non Af Amer: 59 mL/min/{1.73_m2} — ABNORMAL LOW (ref >59)
GLUCOSE: 101 mg/dL — AB (ref 65–99)
Globulin, Total: 2.5 g/dL (ref 1.5–4.5)
Potassium: 4.1 mmol/L (ref 3.5–5.2)
SODIUM: 141 mmol/L (ref 134–144)
Total Protein: 6.8 g/dL (ref 6.0–8.5)

## 2017-08-17 LAB — LIPID PANEL
CHOLESTEROL TOTAL: 123 mg/dL (ref 100–199)
Chol/HDL Ratio: 4.4 ratio (ref 0.0–5.0)
HDL: 28 mg/dL — ABNORMAL LOW (ref >39)
LDL Calculated: 51 mg/dL (ref 0–99)
TRIGLYCERIDES: 219 mg/dL — AB (ref 0–149)
VLDL Cholesterol Cal: 44 mg/dL — ABNORMAL HIGH (ref 5–40)

## 2017-08-19 ENCOUNTER — Telehealth: Payer: Self-pay | Admitting: Family Medicine

## 2017-08-19 ENCOUNTER — Telehealth: Payer: Self-pay

## 2017-08-19 NOTE — Telephone Encounter (Signed)
Pt returned call ° °teri °

## 2017-08-19 NOTE — Telephone Encounter (Signed)
LMTCB

## 2017-08-19 NOTE — Telephone Encounter (Signed)
[name] [name] has the lab result message.

## 2017-08-19 NOTE — Telephone Encounter (Signed)
Patient advised. He verbalized understanding.  

## 2017-08-19 NOTE — Telephone Encounter (Signed)
Patient is returning [place]ikki's call

## 2017-08-19 NOTE — Telephone Encounter (Signed)
-----   Message from Tamsen Roersennis E [name], [place] sent at 08/17/2017  9:16 AM EDT ----- All blood tests essentially normal except triglyceride high and HDL low. Recommend adding Co-Q 10 100 mg qd to the Atorvastatin and Omega-3 Fish Oil with low fat diet. Follow up appointment as planned in 2 weeks.

## 2017-08-23 ENCOUNTER — Ambulatory Visit
Admission: RE | Admit: 2017-08-23 | Discharge: 2017-08-23 | Disposition: A | Discharge status: Home / Self Care | Payer: Medicare Other | Source: Ambulatory Visit | Attending: Family Medicine | Admitting: Family Medicine

## 2017-08-23 ENCOUNTER — Telehealth: Payer: Self-pay

## 2017-08-23 ENCOUNTER — Ambulatory Visit (INDEPENDENT_AMBULATORY_CARE_PROVIDER_SITE_OTHER): Payer: Medicare Other | Admitting: Family Medicine

## 2017-08-23 VITALS — BP 130/78 | HR 72 | Temp 97.9°F | Resp 16 | Wt 214.0 lb

## 2017-08-23 DIAGNOSIS — R042 Hemoptysis: Secondary | ICD-10-CM | POA: Insufficient documentation

## 2017-08-23 DIAGNOSIS — R05 Cough: Secondary | ICD-10-CM | POA: Diagnosis not present

## 2017-08-23 DIAGNOSIS — Z7901 Long term (current) use of anticoagulants: Secondary | ICD-10-CM

## 2017-08-23 LAB — POCT INR
AVAILABLE: 38.2
INR: 3.2 — AB (ref 2.0–3.0)

## 2017-08-23 NOTE — Patient Instructions (Signed)
We will call you with the x-ray results. 

## 2017-08-23 NOTE — Telephone Encounter (Signed)
Patient advised.KW 

## 2017-08-23 NOTE — Telephone Encounter (Signed)
-----   Message from Anola Gurneyobert Chauvin, [place] sent at 08/23/2017  3:31 PM EDT ----- Chest x-ray ok.

## 2017-08-23 NOTE — Progress Notes (Signed)
  Subjective:     Patient ID: [name] [name], male   DOB: 11/05/1945, 72 y.o.   MRN: [id number] Chief Complaint  Patient presents with  . Hemoptysis    Patient takes coumadin   HPI States when he awakened today he felt congested and coughed up sputum with blood in it. Also noticed a small amount of blood in his nasal congestion. Remote hx of smoking.  Review of Systems     Objective:   Physical Exam  Constitutional: He appears well-developed and well-nourished. No distress.  Pulmonary/Chest: Effort normal and breath sounds normal.       Assessment:    1. Hemoptysis: suspect due to anticoagulation only not a primary lesion. - POCT INR - DG Chest 2 View; Future  2. Chronic anticoagulation - POCT INR    Plan:    Further f/u pending x-ray result. He will be due for a schedule pro-time in one week.

## 2017-08-30 ENCOUNTER — Ambulatory Visit (INDEPENDENT_AMBULATORY_CARE_PROVIDER_SITE_OTHER): Payer: Medicare Other

## 2017-08-30 ENCOUNTER — Ambulatory Visit: Payer: Self-pay

## 2017-08-30 ENCOUNTER — Ambulatory Visit: Payer: Medicare Other | Admitting: Family Medicine

## 2017-08-30 DIAGNOSIS — I739 Peripheral vascular disease, unspecified: Secondary | ICD-10-CM | POA: Diagnosis not present

## 2017-08-30 LAB — POCT INR
INR: 3.4 — AB (ref 2.0–3.0)
PT: 40.3

## 2017-08-30 NOTE — Patient Instructions (Signed)
Description   7.5mg  daily except 10mg  on M,W and F recheck in 3 weeks.

## 2017-09-20 ENCOUNTER — Ambulatory Visit (INDEPENDENT_AMBULATORY_CARE_PROVIDER_SITE_OTHER): Payer: Medicare Other

## 2017-09-20 DIAGNOSIS — I739 Peripheral vascular disease, unspecified: Secondary | ICD-10-CM | POA: Diagnosis not present

## 2017-09-20 LAB — POCT INR
INR: 3.7 — AB (ref 2.0–3.0)
PT: 44.1

## 2017-09-20 NOTE — Patient Instructions (Signed)
Description   7.5mg  daily except 10mg  on Monday and Friday recheck in 3 weeks.    Skip dose today (09/20/17).

## 2017-09-23 ENCOUNTER — Ambulatory Visit (HOSPITAL_COMMUNITY)
Admission: RE | Admit: 2017-09-23 | Discharge: 2017-09-23 | Disposition: A | Discharge status: Home / Self Care | Payer: Medicare Other | Source: Ambulatory Visit | Attending: Surgery | Admitting: Surgery

## 2017-09-23 ENCOUNTER — Ambulatory Visit: Payer: Medicare Other | Admitting: Surgery

## 2017-09-23 ENCOUNTER — Ambulatory Visit (INDEPENDENT_AMBULATORY_CARE_PROVIDER_SITE_OTHER)
Admission: RE | Admit: 2017-09-23 | Discharge: 2017-09-23 | Disposition: A | Discharge status: Home / Self Care | Payer: Medicare Other | Source: Ambulatory Visit | Attending: Surgery | Admitting: Surgery

## 2017-09-23 DIAGNOSIS — I739 Peripheral vascular disease, unspecified: Secondary | ICD-10-CM

## 2017-09-23 DIAGNOSIS — I70302 Unspecified atherosclerosis of unspecified type of bypass graft(s) of the extremities, left leg: Secondary | ICD-10-CM | POA: Insufficient documentation

## 2017-09-23 DIAGNOSIS — I724 Aneurysm of artery of lower extremity: Secondary | ICD-10-CM | POA: Insufficient documentation

## 2017-09-23 DIAGNOSIS — Z48812 Encounter for surgical aftercare following surgery on the circulatory system: Secondary | ICD-10-CM | POA: Insufficient documentation

## 2017-09-23 DIAGNOSIS — I714 Abdominal aortic aneurysm, without rupture, unspecified: Secondary | ICD-10-CM

## 2017-09-30 ENCOUNTER — Other Ambulatory Visit: Payer: Self-pay | Admitting: *Deleted

## 2017-09-30 ENCOUNTER — Encounter: Payer: Self-pay | Admitting: Surgery

## 2017-09-30 ENCOUNTER — Telehealth: Payer: Self-pay | Admitting: *Deleted

## 2017-09-30 ENCOUNTER — Ambulatory Visit (INDEPENDENT_AMBULATORY_CARE_PROVIDER_SITE_OTHER): Payer: Medicare Other | Admitting: Surgery

## 2017-09-30 ENCOUNTER — Other Ambulatory Visit: Payer: Self-pay

## 2017-09-30 ENCOUNTER — Encounter: Payer: Self-pay | Admitting: *Deleted

## 2017-09-30 VITALS — BP 108/63 | HR 56 | Temp 97.5°F | Resp 18 | Ht 74.0 in | Wt 209.0 lb

## 2017-09-30 DIAGNOSIS — I724 Aneurysm of artery of lower extremity: Secondary | ICD-10-CM | POA: Diagnosis not present

## 2017-09-30 NOTE — H&P (View-Only) (Signed)
 [facility] [place]  Patient name: [name] [name] MRN: [id number] DOB: 02/11/1946 Sex: male   REASON FOR VISIT:    Follow up  [name]:   The patient comes in today for followup. He initially underwent endovascular abdominal aortic aneurysm repair in October of 2011. He has a known type II endoleak, however his aneurysm continues to decrease. He is also status post endovascular repair of a left popliteal artery aneurysm on October 2011.  He presented to the office on 10/21/2013 with a six-day history of pain and coldness in his left foot. He was found to have an occluded popliteal stent by angiography the following day. An attempt at thrombolysis was performed, however the patient developed some cardiac issues as well as bleeding, and this had to be discontinued. The patient continued to have an ischemic foot and therefore he was taken to the operating room for revascularization. On 10/29/2013, he underwent femoral to posterior tibial artery bypass graft. He had an excellent caliber vein. The bypass graft occluded early and he was taken back. The bypass was able to be re-opened. I explored the posterior tibial artery behind the ankle. I passed catheters all the way into the pedal arch and injected TPA. The patient was transitioned to oral anticoagulation and ultimately discharged. While he was in the hospital . He developed bypass graft stenosis and on 05/25/2014 he underwent drug coated balloon angioplasty to the distal portion of his bypass graft.He had recurrent stenosis within the distal vein graft and in August 2017 he underwent drug coated balloon angioplasty withangiasculpt. He has known occlusion of all 3 tibial vessels.   PAST MEDICAL HISTORY:   Past Medical History:  Diagnosis Date  . Abdominal aortic aneurysm (HCC)   . Chronic kidney disease    Right kidney stone  . Coronary artery disease   .  Detached retina   . Hyperlipidemia   . Hypertension   . Myocardial infarction (HCC)    2007 heart stent  . Popliteal aneurysm (HCC)      FAMILY HISTORY:   Family History  Problem Relation Age of Onset  . Aneurysm Sister        possibly   . Peripheral vascular disease Brother     SOCIAL HISTORY:   Social History   Tobacco Use  . Smoking status: Former Smoker    Years: 15.00    Types: Cigarettes    Last attempt to quit: 09/21/1985    Years since quitting: 32.0  . Smokeless tobacco: Current User    Types: Chew  Substance Use Topics  . Alcohol use: Yes    Comment: 1 beer ocassionally (mostly in summer)     ALLERGIES:   Allergies  Allergen Reactions  . Omnipaque [Iohexol] Rash    Pt requires full premeds and needs to bring a driver.  Broke out in rash on back and arms.  Ok w/ premeds  . Oxytetracycline Rash    Terramycin causes rash  . Alteplase Other (See Comments)    Internal bleeding, per RN in short stay. Patient requested this to be added to his allergy list     CURRENT MEDICATIONS:   Current Outpatient Medications  Medication Sig Dispense Refill  . atorvastatin (LIPITOR) 40 MG tablet TAKE ONE TABLET BY MOUTH ONCE DAILY *PLEASE  KEEP  NOVEMBER  APPOINTMENT  FOR  FURTHER  REFILLS* 90 tablet 3  . metoprolol tartrate (LOPRESSOR) 25 MG tablet TAKE 1 TABLET BY MOUTH TWICE A   DAY 60 tablet 11  . Multiple Vitamin (MULTIVITAMIN WITH MINERALS) TABS tablet Take 1 tablet by mouth daily.    . Omega-3 Fatty Acids (FISH OIL TRIPLE STRENGTH) 1400 MG CAPS Take 2,800 mg by mouth 2 (two) times daily.    . Saw Palmetto 450 MG CAPS Take 900 mg by mouth 2 (two) times daily.    . warfarin (COUMADIN) 5 MG tablet Take 2 tablets by mouth daily except 1.5 tablets on Monday and Friday. 150 tablet 3   No current facility-administered medications for this visit.     REVIEW OF SYSTEMS:   [X] denotes positive finding, [ ] denotes negative finding Cardiac  Comments:  Chest pain or  chest pressure:    Shortness of breath upon exertion:    Short of breath when lying flat:    Irregular heart rhythm:        Vascular    Pain in calf, thigh, or hip brought on by ambulation:    Pain in feet at night that wakes you up from your sleep:     Blood clot in your veins:    Leg swelling:         Pulmonary    Oxygen at home:    Productive cough:     Wheezing:         Neurologic    Sudden weakness in arms or legs:     Sudden numbness in arms or legs:     Sudden onset of difficulty speaking or slurred speech:    Temporary loss of vision in one eye:     Problems with dizziness:         Gastrointestinal    Blood in stool:     Vomited blood:         Genitourinary    Burning when urinating:     Blood in urine:        Psychiatric    Major depression:         Hematologic    Bleeding problems:    Problems with blood clotting too easily:        Skin    Rashes or ulcers:        Constitutional    Fever or chills:      PHYSICAL EXAM:   Vitals:   09/30/17 1312  BP: 108/63  Pulse: (!) 56  Resp: 18  Temp: (!) 97.5 F (36.4 C)  TempSrc: Oral  SpO2: 96%  Weight: 209 lb (94.8 kg)  Height: 6' 2" (1.88 m)    GENERAL: The patient is a well-nourished male, in no acute distress. The vital signs are documented above. CARDIAC: There is a regular rate and rhythm.  VASCULAR: non plapable pedal pulse on left PULMONARY: Non-labored respirations MUSCULOSKELETAL: There are no major deformities or cyanosis. NEUROLOGIC: No focal weakness or paresthesias are detected. SKIN: There are no ulcers or rashes noted. PSYCHIATRIC: The patient has a normal affect.  STUDIES:   I have ordered and reviewed his vascular lab studies with the following findings: Abdominal aorta.  Maximum diameter is 3.5 cm.  No evidence of endoleak.  Right ABI= 0.94 (1.01 Left ABI= 0.49 (0.73  MEDICAL ISSUES:   AAA: Follow-up ultrasound scheduled for 1 year  Left leg bypass: The patient's ABIs  have dropped.  There is a known stenosis within the bypass graft.  I presume this is the same area that was treated in August 2017 with a 4 mm drug-coated balloon.  Patient has very poor tibial   runoff and therefore I am very concerned about his bypass graft.  Because of the change in ABIs I have recommended that we proceed with angiogram for further evaluation and intervention.  This will need to be through a antegrade approach.  I will have this scheduled in September.  He will stop his Coumadin prior to the procedure.    Wells Brabham, MD [facility] [place] Tel [phone] Pager [phone] 

## 2017-09-30 NOTE — Progress Notes (Signed)
[facility] of [place]  Patient name: [name] [name] MRN: [id number] DOB: 1945-03-10 Sex: male   REASON FOR VISIT:    Follow up  [name]:   The patient comes in today for followup. He initially underwent endovascular abdominal aortic aneurysm repair in October of 2011. He has a known type II endoleak, however his aneurysm continues to decrease. He is also status post endovascular repair of a left popliteal artery aneurysm on October 2011.  He presented to the office on 10/21/2013 with a six-day history of pain and coldness in his left foot. He was found to have an occluded popliteal stent by angiography the following day. An attempt at thrombolysis was performed, however the patient developed some cardiac issues as well as bleeding, and this had to be discontinued. The patient continued to have an ischemic foot and therefore he was taken to the operating room for revascularization. On 10/29/2013, he underwent femoral to posterior tibial artery bypass graft. He had an excellent caliber vein. The bypass graft occluded early and he was taken back. The bypass was able to be re-opened. I explored the posterior tibial artery behind the ankle. I passed catheters all the way into the pedal arch and injected TPA. The patient was transitioned to oral anticoagulation and ultimately discharged. While he was in the hospital . He developed bypass graft stenosis and on 05/25/2014 he underwent drug coated balloon angioplasty to the distal portion of his bypass graft.He had recurrent stenosis within the distal vein graft and in August 2017 he underwent drug coated balloon angioplasty withangiasculpt. He has known occlusion of all 3 tibial vessels.   PAST MEDICAL HISTORY:   Past Medical History:  Diagnosis Date  . Abdominal aortic aneurysm (HCC)   . Chronic kidney disease    Right kidney stone  . Coronary artery disease   .  Detached retina   . Hyperlipidemia   . Hypertension   . Myocardial infarction [facility])    2007 heart stent  . Popliteal aneurysm (HCC)      FAMILY HISTORY:   Family History  Problem Relation Age of Onset  . Aneurysm Sister        possibly   . Peripheral vascular disease Brother     SOCIAL HISTORY:   Social History   Tobacco Use  . Smoking status: Former Smoker    Years: 15.00    Types: Cigarettes    Last attempt to quit: 09/21/1985    Years since quitting: 32.0  . Smokeless tobacco: Current User    Types: Chew  Substance Use Topics  . Alcohol use: Yes    Comment: 1 beer ocassionally (mostly in summer)     ALLERGIES:   Allergies  Allergen Reactions  . Omnipaque [Iohexol] Rash    Pt requires full premeds and needs to bring a driver.  Broke out in rash on back and arms.  Ok w/ premeds  . Oxytetracycline Rash    Terramycin causes rash  . Alteplase Other (See Comments)    Internal bleeding, per RN in short stay. Patient requested this to be added to his allergy list     CURRENT MEDICATIONS:   Current Outpatient Medications  Medication Sig Dispense Refill  . atorvastatin (LIPITOR) 40 MG tablet TAKE ONE TABLET BY MOUTH ONCE DAILY *PLEASE  KEEP  NOVEMBER  APPOINTMENT  FOR  FURTHER  REFILLS* 90 tablet 3  . metoprolol tartrate (LOPRESSOR) 25 MG tablet TAKE 1 TABLET BY MOUTH TWICE A  DAY 60 tablet 11  . Multiple Vitamin (MULTIVITAMIN WITH MINERALS) TABS tablet Take 1 tablet by mouth daily.    . Omega-3 Fatty Acids (FISH OIL TRIPLE STRENGTH) 1400 MG CAPS Take 2,800 mg by mouth 2 (two) times daily.    . Saw Palmetto 450 MG CAPS Take 900 mg by mouth 2 (two) times daily.    Marland Kitchen. warfarin (COUMADIN) 5 MG tablet Take 2 tablets by mouth daily except 1.5 tablets on Monday and Friday. 150 tablet 3   No current facility-administered medications for this visit.     REVIEW OF SYSTEMS:   [X]  denotes positive finding, [ ]  denotes negative finding Cardiac  Comments:  Chest pain or  chest pressure:    Shortness of breath upon exertion:    Short of breath when lying flat:    Irregular heart rhythm:        Vascular    Pain in calf, thigh, or hip brought on by ambulation:    Pain in feet at night that wakes you up from your sleep:     Blood clot in your veins:    Leg swelling:         Pulmonary    Oxygen at home:    Productive cough:     Wheezing:         Neurologic    Sudden weakness in arms or legs:     Sudden numbness in arms or legs:     Sudden onset of difficulty speaking or slurred speech:    Temporary loss of vision in one eye:     Problems with dizziness:         Gastrointestinal    Blood in stool:     Vomited blood:         Genitourinary    Burning when urinating:     Blood in urine:        Psychiatric    Major depression:         Hematologic    Bleeding problems:    Problems with blood clotting too easily:        Skin    Rashes or ulcers:        Constitutional    Fever or chills:      PHYSICAL EXAM:   Vitals:   09/30/17 1312  BP: 108/63  Pulse: (!) 56  Resp: 18  Temp: (!) 97.5 F (36.4 C)  TempSrc: Oral  SpO2: 96%  Weight: 209 lb (94.8 kg)  Height: 6\' 2"  (1.88 m)    GENERAL: The patient is a well-nourished male, in no acute distress. The vital signs are documented above. CARDIAC: There is a regular rate and rhythm.  VASCULAR: non plapable pedal pulse on left PULMONARY: Non-labored respirations MUSCULOSKELETAL: There are no major deformities or cyanosis. NEUROLOGIC: No focal weakness or paresthesias are detected. SKIN: There are no ulcers or rashes noted. PSYCHIATRIC: The patient has a normal affect.  STUDIES:   I have ordered and reviewed his vascular lab studies with the following findings: Abdominal aorta.  Maximum diameter is 3.5 cm.  No evidence of endoleak.  Right ABI= 0.94 (1.01 Left ABI= 0.49 (0.73  MEDICAL ISSUES:   AAA: Follow-up ultrasound scheduled for 1 year  Left leg bypass: The patient's ABIs  have dropped.  There is a known stenosis within the bypass graft.  I presume this is the same area that was treated in August 2017 with a 4 mm drug-coated balloon.  Patient has very poor tibial  runoff and therefore I am very concerned about his bypass graft.  Because of the change in ABIs I have recommended that we proceed with angiogram for further evaluation and intervention.  This will need to be through a antegrade approach.  I will have this scheduled in September.  He will stop his Coumadin prior to the procedure.    Durene CalWells Veverly Larimer, MD [facility] Tel [phone] Pager [phone]

## 2017-09-30 NOTE — Telephone Encounter (Signed)
-----   Message from Retta Macebecca J Tonika Eden, RN sent at 09/30/2017  3:37 PM EDT ----- Regarding: COUMADIN HOLD FOR PROCEDURE Patient is scheduled for Aortogram with bilateral runoff possible intervention for 10-22-2017 with Dr. Myra GianottiBrabham. Will need to hold Couamdin for 5 days pre-procedure. Requesting anticoagulation clearance to hold and bridging with Lovenox if indicated.   [facility] [place] [phone] fax [phone] Ferdinand LangoBecky Idona Stach RN

## 2017-10-01 ENCOUNTER — Telehealth: Payer: Self-pay | Admitting: *Deleted

## 2017-10-01 NOTE — Telephone Encounter (Signed)
Call to patient and instructed to [facility] at [facility] at 7 am on 10/22/17.

## 2017-10-06 ENCOUNTER — Other Ambulatory Visit: Payer: Self-pay | Admitting: Family Medicine

## 2017-10-07 ENCOUNTER — Encounter: Payer: Self-pay | Admitting: Surgery

## 2017-10-11 ENCOUNTER — Ambulatory Visit (INDEPENDENT_AMBULATORY_CARE_PROVIDER_SITE_OTHER): Payer: Medicare Other

## 2017-10-11 DIAGNOSIS — I739 Peripheral vascular disease, unspecified: Secondary | ICD-10-CM

## 2017-10-11 LAB — POCT INR
INR: 3.6 — AB (ref 2.0–3.0)
PT: 42.8

## 2017-10-11 NOTE — Patient Instructions (Signed)
Description   Patient reports he has not had his 10 mg today. Per Maurine Ministerennis Skip dose today.  Then continue with his current dosage.  7.5 mg qd except 10 mg on Mondays and Fridays. Return in 2 weeks.

## 2017-10-22 ENCOUNTER — Telehealth: Payer: Self-pay | Admitting: Surgery

## 2017-10-22 ENCOUNTER — Encounter (HOSPITAL_COMMUNITY): Payer: Self-pay | Admitting: Surgery

## 2017-10-22 ENCOUNTER — Ambulatory Visit (HOSPITAL_COMMUNITY)
Admission: RE | Admit: 2017-10-22 | Discharge: 2017-10-22 | Disposition: A | Discharge status: Home / Self Care | Payer: Medicare Other | Source: Ambulatory Visit | Attending: Surgery | Admitting: Surgery

## 2017-10-22 ENCOUNTER — Other Ambulatory Visit: Payer: Self-pay

## 2017-10-22 ENCOUNTER — Encounter (HOSPITAL_COMMUNITY)
Admission: RE | Disposition: A | Discharge status: Home / Self Care | Payer: Self-pay | Source: Ambulatory Visit | Attending: Surgery

## 2017-10-22 DIAGNOSIS — E785 Hyperlipidemia, unspecified: Secondary | ICD-10-CM | POA: Insufficient documentation

## 2017-10-22 DIAGNOSIS — Z7901 Long term (current) use of anticoagulants: Secondary | ICD-10-CM | POA: Insufficient documentation

## 2017-10-22 DIAGNOSIS — I714 Abdominal aortic aneurysm, without rupture, unspecified: Secondary | ICD-10-CM

## 2017-10-22 DIAGNOSIS — I2510 Atherosclerotic heart disease of native coronary artery without angina pectoris: Secondary | ICD-10-CM | POA: Insufficient documentation

## 2017-10-22 DIAGNOSIS — I129 Hypertensive chronic kidney disease with stage 1 through stage 4 chronic kidney disease, or unspecified chronic kidney disease: Secondary | ICD-10-CM | POA: Insufficient documentation

## 2017-10-22 DIAGNOSIS — Z955 Presence of coronary angioplasty implant and graft: Secondary | ICD-10-CM | POA: Diagnosis not present

## 2017-10-22 DIAGNOSIS — I70402 Unspecified atherosclerosis of autologous vein bypass graft(s) of the extremities, left leg: Secondary | ICD-10-CM | POA: Insufficient documentation

## 2017-10-22 DIAGNOSIS — N189 Chronic kidney disease, unspecified: Secondary | ICD-10-CM | POA: Diagnosis not present

## 2017-10-22 DIAGNOSIS — Z720 Tobacco use: Secondary | ICD-10-CM | POA: Diagnosis not present

## 2017-10-22 DIAGNOSIS — I739 Peripheral vascular disease, unspecified: Secondary | ICD-10-CM

## 2017-10-22 DIAGNOSIS — I252 Old myocardial infarction: Secondary | ICD-10-CM | POA: Insufficient documentation

## 2017-10-22 DIAGNOSIS — I724 Aneurysm of artery of lower extremity: Secondary | ICD-10-CM

## 2017-10-22 DIAGNOSIS — I70302 Unspecified atherosclerosis of unspecified type of bypass graft(s) of the extremities, left leg: Secondary | ICD-10-CM | POA: Diagnosis not present

## 2017-10-22 DIAGNOSIS — I779 Disorder of arteries and arterioles, unspecified: Secondary | ICD-10-CM

## 2017-10-22 HISTORY — PX: PERIPHERAL VASCULAR BALLOON ANGIOPLASTY: CATH118281

## 2017-10-22 HISTORY — PX: ABDOMINAL AORTOGRAM W/LOWER EXTREMITY: CATH118223

## 2017-10-22 LAB — POCT I-STAT, CHEM 8
BUN: 15 mg/dL (ref 8–23)
CHLORIDE: 109 mmol/L (ref 98–111)
CREATININE: 1.2 mg/dL (ref 0.61–1.24)
Calcium, Ion: 1.19 mmol/L (ref 1.15–1.40)
GLUCOSE: 102 mg/dL — AB (ref 70–99)
HCT: 44 % (ref 39.0–52.0)
Hemoglobin: 15 g/dL (ref 13.0–17.0)
POTASSIUM: 4 mmol/L (ref 3.5–5.1)
Sodium: 141 mmol/L (ref 135–145)
TCO2: 24 mmol/L (ref 22–32)

## 2017-10-22 LAB — POCT ACTIVATED CLOTTING TIME
ACTIVATED CLOTTING TIME: 197 s
ACTIVATED CLOTTING TIME: 175 s
Activated Clotting Time: 252 seconds

## 2017-10-22 LAB — PROTIME-INR
INR: 1.14
Prothrombin Time: 14.5 seconds (ref 11.4–15.2)

## 2017-10-22 SURGERY — ABDOMINAL AORTOGRAM W/LOWER EXTREMITY
Anesthesia: LOCAL

## 2017-10-22 MED ORDER — DIPHENHYDRAMINE HCL 50 MG/ML IJ SOLN
25.0000 mg | INTRAMUSCULAR | Status: AC
  Administered 2017-10-22: 25 mg via INTRAVENOUS
  Filled 2017-10-22: qty 1

## 2017-10-22 MED ORDER — ASPIRIN EC 81 MG PO TBEC: 81.0000 mg | DELAYED_RELEASE_TABLET | Freq: Every day | ORAL | Status: DC

## 2017-10-22 MED ORDER — LIDOCAINE HCL (PF) 1 % IJ SOLN
INTRAMUSCULAR | Status: AC
  Filled 2017-10-22: qty 30

## 2017-10-22 MED ORDER — MORPHINE SULFATE (PF) 2 MG/ML IV SOLN: 2.0000 mg | INTRAVENOUS | Status: DC | PRN

## 2017-10-22 MED ORDER — FAMOTIDINE IN NACL 20-0.9 MG/50ML-% IV SOLN
20.0000 mg | INTRAVENOUS | Status: AC
  Administered 2017-10-22: 20 mg via INTRAVENOUS
  Filled 2017-10-22: qty 50

## 2017-10-22 MED ORDER — ONDANSETRON HCL 4 MG/2ML IJ SOLN: 4.0000 mg | Freq: Four times a day (QID) | INTRAMUSCULAR | Status: DC | PRN

## 2017-10-22 MED ORDER — HEPARIN SODIUM (PORCINE) 1000 UNIT/ML IJ SOLN
INTRAMUSCULAR | Status: DC | PRN
  Administered 2017-10-22: 9000 [IU] via INTRAVENOUS

## 2017-10-22 MED ORDER — FENTANYL CITRATE (PF) 100 MCG/2ML IJ SOLN
INTRAMUSCULAR | Status: DC | PRN
  Administered 2017-10-22: 50 ug via INTRAVENOUS

## 2017-10-22 MED ORDER — SODIUM CHLORIDE 0.9 % WEIGHT BASED INFUSION: 1.0000 mL/kg/h | INTRAVENOUS | Status: DC

## 2017-10-22 MED ORDER — FENTANYL CITRATE (PF) 100 MCG/2ML IJ SOLN
INTRAMUSCULAR | Status: AC
  Filled 2017-10-22: qty 2

## 2017-10-22 MED ORDER — SODIUM CHLORIDE 0.9% FLUSH: 3.0000 mL | INTRAVENOUS | Status: DC | PRN

## 2017-10-22 MED ORDER — MIDAZOLAM HCL 2 MG/2ML IJ SOLN
INTRAMUSCULAR | Status: DC | PRN
  Administered 2017-10-22: 2 mg via INTRAVENOUS

## 2017-10-22 MED ORDER — METHYLPREDNISOLONE SODIUM SUCC 125 MG IJ SOLR
125.0000 mg | INTRAMUSCULAR | Status: AC
  Administered 2017-10-22: 125 mg via INTRAVENOUS
  Filled 2017-10-22: qty 2

## 2017-10-22 MED ORDER — SODIUM CHLORIDE 0.9% FLUSH: 3.0000 mL | Freq: Two times a day (BID) | INTRAVENOUS | Status: DC

## 2017-10-22 MED ORDER — OXYCODONE HCL 5 MG PO TABS: 5.0000 mg | ORAL_TABLET | ORAL | Status: DC | PRN

## 2017-10-22 MED ORDER — IODIXANOL 320 MG/ML IV SOLN
INTRAVENOUS | Status: DC | PRN
  Administered 2017-10-22: 60 mL via INTRA_ARTERIAL

## 2017-10-22 MED ORDER — MIDAZOLAM HCL 2 MG/2ML IJ SOLN
INTRAMUSCULAR | Status: AC
  Filled 2017-10-22: qty 2

## 2017-10-22 MED ORDER — ACETAMINOPHEN 325 MG PO TABS: 650.0000 mg | ORAL_TABLET | ORAL | Status: DC | PRN

## 2017-10-22 MED ORDER — LABETALOL HCL 5 MG/ML IV SOLN: 10.0000 mg | INTRAVENOUS | Status: DC | PRN

## 2017-10-22 MED ORDER — HYDRALAZINE HCL 20 MG/ML IJ SOLN: 5.0000 mg | INTRAMUSCULAR | Status: DC | PRN

## 2017-10-22 MED ORDER — HEPARIN (PORCINE) IN NACL 1000-0.9 UT/500ML-% IV SOLN
INTRAVENOUS | Status: DC | PRN
  Administered 2017-10-22 (×2): 500 mL

## 2017-10-22 MED ORDER — LIDOCAINE HCL (PF) 1 % IJ SOLN
INTRAMUSCULAR | Status: DC | PRN
  Administered 2017-10-22: 20 mL

## 2017-10-22 MED ORDER — SODIUM CHLORIDE 0.9 % IV SOLN
INTRAVENOUS | Status: DC
  Administered 2017-10-22: 08:00:00 via INTRAVENOUS

## 2017-10-22 MED ORDER — SODIUM CHLORIDE 0.9 % IV SOLN: 250.0000 mL | INTRAVENOUS | Status: DC | PRN

## 2017-10-22 MED ORDER — HEPARIN (PORCINE) IN NACL 1000-0.9 UT/500ML-% IV SOLN
INTRAVENOUS | Status: AC
  Filled 2017-10-22: qty 1000

## 2017-10-22 SURGICAL SUPPLY — 19 items
BALLN LUTONIX DCB 5X40X130 (BALLOONS) ×3
BALLN MUSTANG 5.0X40 135 (BALLOONS)
BALLOON LUTONIX DCB 5X40X130 (BALLOONS) ×2 IMPLANT
BALLOON MUSTANG 5.0X40 135 (BALLOONS) IMPLANT
CATH ANGIO 5F BER2 65CM (CATHETERS) ×3 IMPLANT
DEVICE CONTINUOUS FLUSH (MISCELLANEOUS) ×3 IMPLANT
DRAPE ZERO GRAVITY STERILE (DRAPES) ×3 IMPLANT
GUIDEWIRE ANGLED .035X150CM (WIRE) ×3 IMPLANT
KIT ENCORE 26 ADVANTAGE (KITS) ×3 IMPLANT
KIT MICROPUNCTURE NIT STIFF (SHEATH) ×3 IMPLANT
KIT PV (KITS) ×3 IMPLANT
SHEATH PINNACLE 5F 10CM (SHEATH) ×3 IMPLANT
SHEATH PINNACLE R/O II 5F 6CM (SHEATH) ×3 IMPLANT
SHEATH PROBE COVER 6X72 (BAG) ×3 IMPLANT
SYR MEDRAD MARK V 150ML (SYRINGE) ×3 IMPLANT
TRANSDUCER W/STOPCOCK (MISCELLANEOUS) ×3 IMPLANT
TRAY PV CATH (CUSTOM PROCEDURE TRAY) ×3 IMPLANT
WIRE BENTSON .035X145CM (WIRE) ×3 IMPLANT
WIRE HI TORQ VERSACORE J 260CM (WIRE) ×3 IMPLANT

## 2017-10-22 NOTE — Telephone Encounter (Signed)
sch appt lvm mld ltr 04/21/18 9am EVAR 10am ABI 1045am f/u MD

## 2017-10-22 NOTE — Progress Notes (Signed)
Site area: Left  Groin a 5 french arterial sheath was removed  Site Prior to Removal:  Level 0  Pressure Applied For 20   MINUTES    Bedrest Beginning at 1235p Manual:   Yes.    Patient Status During Pull:  stable  Post Pull Groin Site:  Level 0  Post Pull Instructions Given:  Yes.    Post Pull Pulses Present:  Yes.    Dressing Applied:  Yes.    Comments:  VS remain stable

## 2017-10-22 NOTE — Discharge Instructions (Signed)

## 2017-10-22 NOTE — Op Note (Addendum)
    Patient name: MAKALE PINDELL MRN: [id number] DOB: 03-31-1945 Sex: male   10/22/2017 Pre-operative Diagnosis: Bypass graft stenosis Post-operative diagnosis:  Same Surgeon:  Annamarie Major Procedure Performed:  1.  Ultrasound-guided access, left femoral artery (antegrade)  2.  Left lower extremity runoff  3.  Drug-coated balloon angioplasty, left leg bypass  4.  Conscious sedation (47 minutes)    Indications: Patient on surveillance ultrasound imaging was found to have a recurrent stenosis at this left leg bypass graft he comes in today for evaluation and possible intervention  Procedure:  The patient was identified in the holding area and taken to room 8.  The patient was then placed supine on the table and prepped and draped in the usual sterile fashion.  A time out was called.  Conscious sedation was administered with the use of IV fentanyl and Versed under continuous physician and nurse monitoring.  Heart rate, blood pressure, and oxygen saturation continue to monitor.  Ultrasound was used to evaluate the left common femoral artery.  It was patent .  A digital ultrasound image was acquired.  A micropuncture needle was used to access the left common femoral artery under ultrasound guidance in a antegrade fashion.  An 018 wire was advanced without resistance and a micropuncture sheath was placed.  The 018 wire was removed and a benson wire was placed.  A contrast injection was performed that showed that the wire had gone into the profundofemoral artery.  I then advanced a 5 French bright tipped short sheath over a Bentson wire and perform pullback injections to locate the origin of the bypass graft.  I then used a Berenstein 2 catheter to direct a Bentson wire into the bypass graft and the sheath was then advanced.  Contrast injection of the leg was then performed. Findings:     Left Lower Extremity: Left common femoral profundofemoral artery widely patent.  Superficial femoral artery is  occluded at its origin.  The femoral popliteal bypass graft is patent and there is approximately 70% stenosis (tandem) in the lower half of the bypass graft.  There is occlusion of all 3 tibial vessels  Intervention: After the above images were acquired the decision was made to proceed with intervention.  Patient was fully heparinized a 035 versa core wire was advanced across the lesions.  Next, a 5 x 40 drug-coated Lutonix balloon was prepared and inserted across the lesion.  Balloon angioplasty was performed for 2 minutes at 15 atm.  Follow-up imaging revealed resolution of the stenosis, less than 10%.  At this point wire was removed and the patient was taken the whole year for sheath pull once the coagulation profile corrects.  Impression:  #1  60 to 70% stenosis within the distal bypass graft successfully treated using a 5 x 40 drug-coated Lutonix balloon    V. Annamarie Major, M.D. [facility] [place] Office: [phone] Pager:  [phone]

## 2017-10-22 NOTE — Progress Notes (Signed)
Per Dr Myra Gianotti via phone ok for pt to resume coumadin tonight

## 2017-10-22 NOTE — Interval H&P Note (Signed)
History and Physical Interval Note:  10/22/2017 9:25 AM  [name] [name]  has presented today for surgery, with the diagnosis of pad  The various methods of treatment have been discussed with the patient and family. After consideration of risks, benefits and other options for treatment, the patient has consented to  Procedure(s): ABDOMINAL AORTOGRAM W/LOWER EXTREMITY (N/A) as a surgical intervention .  The patient's history has been reviewed, patient examined, no change in status, stable for surgery.  I have reviewed the patient's chart and labs.  Questions were answered to the patient's satisfaction.     Durene Cal

## 2017-10-25 ENCOUNTER — Ambulatory Visit (INDEPENDENT_AMBULATORY_CARE_PROVIDER_SITE_OTHER): Payer: Medicare Other

## 2017-10-25 DIAGNOSIS — I739 Peripheral vascular disease, unspecified: Secondary | ICD-10-CM

## 2017-10-25 DIAGNOSIS — I779 Disorder of arteries and arterioles, unspecified: Secondary | ICD-10-CM | POA: Diagnosis not present

## 2017-10-25 LAB — POCT INR
INR: 1.2 — AB (ref 2.0–3.0)
PT: 14

## 2017-10-25 NOTE — Patient Instructions (Signed)
Description   Patient had surgery recently. Per Maurine Ministerennis take 7.5 mg qd except 10 mg on Mondays, Wednesdays and Fridays. Return in 2 weeks for recheck.

## 2017-11-08 ENCOUNTER — Ambulatory Visit (INDEPENDENT_AMBULATORY_CARE_PROVIDER_SITE_OTHER): Payer: Medicare Other

## 2017-11-08 DIAGNOSIS — I739 Peripheral vascular disease, unspecified: Secondary | ICD-10-CM | POA: Diagnosis not present

## 2017-11-08 DIAGNOSIS — I779 Disorder of arteries and arterioles, unspecified: Secondary | ICD-10-CM

## 2017-11-08 LAB — POCT INR
INR: 3.4 — AB (ref 2.0–3.0)
PT: 41.2

## 2017-11-08 NOTE — Patient Instructions (Addendum)
Description   Continue 7.5mg  daily, except 10mg  on Monday, Wednesday, and Friday.

## 2017-11-28 ENCOUNTER — Telehealth: Payer: Self-pay

## 2017-11-28 NOTE — Telephone Encounter (Signed)
LMTCB and schedule AWV. Last AWV was 12/28/16. -MM

## 2017-12-06 ENCOUNTER — Ambulatory Visit (INDEPENDENT_AMBULATORY_CARE_PROVIDER_SITE_OTHER): Payer: Medicare Other

## 2017-12-06 DIAGNOSIS — Z23 Encounter for immunization: Secondary | ICD-10-CM | POA: Diagnosis not present

## 2017-12-06 DIAGNOSIS — I739 Peripheral vascular disease, unspecified: Secondary | ICD-10-CM | POA: Diagnosis not present

## 2017-12-06 LAB — POCT INR: INR: 4.3 — AB (ref 2.0–3.0)

## 2017-12-06 NOTE — Progress Notes (Signed)
Proceed with dosage as listed in comment and recheck protime in 2 weeks.

## 2017-12-18 ENCOUNTER — Other Ambulatory Visit: Payer: Self-pay | Admitting: Cardiovascular Disease

## 2017-12-24 ENCOUNTER — Ambulatory Visit (INDEPENDENT_AMBULATORY_CARE_PROVIDER_SITE_OTHER): Payer: Medicare Other

## 2017-12-24 DIAGNOSIS — I779 Disorder of arteries and arterioles, unspecified: Secondary | ICD-10-CM | POA: Diagnosis not present

## 2017-12-24 DIAGNOSIS — I739 Peripheral vascular disease, unspecified: Secondary | ICD-10-CM

## 2017-12-24 LAB — POCT INR
INR: 3.7 — AB (ref 2.0–3.0)
PT: 44.6

## 2017-12-24 NOTE — Patient Instructions (Signed)
Description   Hold todays dose then Continue 7.5mg  daily, except 10mg  on Monday and Friday.

## 2017-12-27 NOTE — Telephone Encounter (Signed)
Scheduled for 01/28/18 for his AWV. -MM

## 2017-12-29 ENCOUNTER — Other Ambulatory Visit: Payer: Self-pay | Admitting: Cardiovascular Disease

## 2018-01-14 ENCOUNTER — Ambulatory Visit (INDEPENDENT_AMBULATORY_CARE_PROVIDER_SITE_OTHER): Payer: Medicare Other

## 2018-01-14 DIAGNOSIS — I779 Disorder of arteries and arterioles, unspecified: Secondary | ICD-10-CM | POA: Diagnosis not present

## 2018-01-14 DIAGNOSIS — I739 Peripheral vascular disease, unspecified: Secondary | ICD-10-CM

## 2018-01-14 LAB — POCT INR
INR: 3.5 — AB (ref 2.0–3.0)
PT: 41.6

## 2018-01-14 NOTE — Patient Instructions (Addendum)
Description   Hold todays dose then Continue 7.5mg  daily, except 10mg  on Monday.

## 2018-01-21 DIAGNOSIS — H33051 Total retinal detachment, right eye: Secondary | ICD-10-CM | POA: Diagnosis not present

## 2018-01-21 NOTE — Progress Notes (Signed)
Cardiology Office Note    Date:  01/22/2018   ID:  [name] Heritagehomas E Eisinger, DOB 05/22/1945, MRN [id number]  PCP:  Tamsen Roershrismon, Dennis E, PA  Cardiologist: Tonny BollmanMichael Cooper, MD EPS: None  No chief complaint on file.   History of Present Illness:  [name] [name] is a 72 y.o. male with history of CAD status post acute inferior wall MI in 2009 treated with bare-metal stent to the RCA.  He also had peripheral bypass of an abdominal aortic aneurysm and left popliteal aneurysm.  Last saw Dr. Excell Seltzerooper 12/31/2016 at which time he was doing well.  Most recently underwent drug-coated balloon angioplasty of left leg bypass by Dr. Myra GianottiBrabham 10/22/2017.  Patient comes in for yearly f/u. No regular exercise since he lost his dog. Is installing a carport and raking leaves without trouble. Denies chest pain, dyspnea, dyspnea on exertion, dizziness or presyncope.   Past Medical History:  Diagnosis Date  . Abdominal aortic aneurysm (HCC)   . Chronic kidney disease    Right kidney stone  . Coronary artery disease   . Detached retina   . Hyperlipidemia   . Hypertension   . Myocardial infarction [facility](HCC)    2007 heart stent  . Popliteal aneurysm [facility](HCC)     Past Surgical History:  Procedure Laterality Date  . ABDOMINAL AORTIC ANEURYSM REPAIR  11/24/09   EVAR  . ABDOMINAL AORTOGRAM W/LOWER EXTREMITY N/A 10/22/2017   Procedure: ABDOMINAL AORTOGRAM W/LOWER EXTREMITY;  Surgeon: Nada LibmanBrabham, Vance W, MD;  [facility];  Service: Cardiovascular;  Laterality: N/A;  . AMPUTATION Left 12/10/2013   Procedure: AMPUTATION LEFT  FOURTH AND FIFTH TOES;  Surgeon: Nada LibmanVance W Brabham, MD;  Location: [facility] OR;  Service: Vascular;  Laterality: Left;  . COLONOSCOPY    . coronary stenting     2007  . EYE SURGERY Right    cataract removed  . FEMORAL-POPLITEAL BYPASS GRAFT Left 10/29/2013   Procedure: BYPASS GRAFT FEMORAL-POPLITEAL ARTERY;  Surgeon: Nada LibmanVance W Brabham, MD;  Location: [facility] OR;  Service: Vascular;  Laterality:  Left;  Left Femoral to Posterior Tibial artery bypass graft using nonreversed saphenous vein. and thrombectomy of Tibial arterly.  . FEMORAL-TIBIAL BYPASS GRAFT Left 10/30/2013   Procedure: Reexploration of Femoral-Posterior Tibial Bypass; Thrombectomy of Bypass; Left Leg Angiogram; Posterior Tibial Vein Patch Angioplasty;  Surgeon: Nada LibmanVance W Brabham, MD;  Location: [facility] OR;  Service: Vascular;  Laterality: Left;  . LOWER EXTREMITY ANGIOGRAM N/A 05/25/2014   Procedure: LOWER EXTREMITY ANGIOGRAM;  Surgeon: Nada LibmanVance W Brabham, MD;  [facility];  Service: Cardiovascular;  Laterality: N/A;  . PERIPHERAL VASCULAR BALLOON ANGIOPLASTY Left 10/22/2017   Procedure: PERIPHERAL VASCULAR BALLOON ANGIOPLASTY;  Surgeon: Nada LibmanBrabham, Vance W, MD;  [facility];  Service: Cardiovascular;  Laterality: Left;  fem pop bypass  . PERIPHERAL VASCULAR CATHETERIZATION Left 10/04/2015   Procedure: Lower Extremity Angiography;  Surgeon: Nada LibmanVance W Brabham, MD;  [facility];  Service: Cardiovascular;  Laterality: Left;  . PERIPHERAL VASCULAR CATHETERIZATION Left 10/04/2015   Procedure: Peripheral Vascular Intervention;  Surgeon: Nada LibmanVance W Brabham, MD;  [facility];  Service: Cardiovascular;  Laterality: Left;  . POPLITEAL ARTERY STENT  11/2009   left popliteal artery   . RETINAL DETACHMENT SURGERY Right 2011  . TOOTH EXTRACTION  June 2015   Wisdom Tooth Extraction    Current Medications: Current Meds  Medication Sig  . atorvastatin (LIPITOR) 40 MG tablet TAKE 1 TABLET BY MOUTH ONCE DAILY  .  metoprolol tartrate (LOPRESSOR) 25 MG tablet TAKE 1 TABLET BY MOUTH TWICE A DAY  . Multiple Vitamin (MULTIVITAMIN WITH MINERALS) TABS tablet Take 1 tablet by mouth daily.  . Omega-3 Fatty Acids (FISH OIL TRIPLE STRENGTH) 1400 MG CAPS Take 2,800 mg by mouth 2 (two) times daily.  . Saw Palmetto 450 MG CAPS Take 900 mg by mouth 2 (two) times daily.  Marland Kitchen warfarin (COUMADIN) 5 MG tablet TAKE 1.5  TABLETS BY MOUTH ONCE DAILY EXCEPT TAKE 2 TABLETS ON MONDAY AND FRIDAY (Patient taking differently: Take 7.5-10 mg by mouth See admin instructions. Take 10 mg by mouth daily on Monday and Friday. Take 7.5 mg by mouth daily on all other days.)     Allergies:   Omnipaque [iohexol]; Oxytetracycline; and Alteplase   Social History   Socioeconomic History  . Marital status: Married    Spouse name: Not on file  . Number of children: Not on file  . Years of education: Not on file  . Highest education level: Not on file  Occupational History  . Occupation: Retired  Engineer, production  . Financial resource strain: Not on file  . Food insecurity:    Worry: Not on file    Inability: Not on file  . Transportation needs:    Medical: Not on file    Non-medical: Not on file  Tobacco Use  . Smoking status: Former Smoker    Years: 15.00    Types: Cigarettes    Last attempt to quit: 09/21/1985    Years since quitting: 32.3  . Smokeless tobacco: Current User    Types: Chew  Substance and Sexual Activity  . Alcohol use: Yes    Comment: 1 beer ocassionally (mostly in summer)  . Drug use: No  . Sexual activity: Not on file  Lifestyle  . Physical activity:    Days per week: Not on file    Minutes per session: Not on file  . Stress: Not on file  Relationships  . Social connections:    Talks on phone: Not on file    Gets together: Not on file    Attends religious service: Not on file    Active member of club or organization: Not on file    Attends meetings of clubs or organizations: Not on file    Relationship status: Not on file  Other Topics Concern  . Not on file  Social History Narrative   Lives with family.     Family History:  The patient's family history includes Aneurysm in his sister; Peripheral vascular disease in his brother.   ROS:   Please see the history of present illness.    Review of Systems  Constitution: Negative.  HENT: Negative.   Cardiovascular: Negative.     Respiratory: Negative.   Endocrine: Negative.   Hematologic/Lymphatic: Negative.   Musculoskeletal: Negative.        Bilateral shoulder aching that is constant  Gastrointestinal: Negative.   Genitourinary: Negative.   Neurological: Negative.    All other systems reviewed and are negative.   PHYSICAL EXAM:   VS:  BP 114/70   Pulse 61   Ht 6\' 2"  (1.88 m)   Wt 208 lb 1.9 oz (94.4 kg)   SpO2 97%   BMI 26.72 kg/m   Physical Exam  GEN: Well nourished, well developed, in no acute distress  Neck: no JVD, carotid bruits, or masses Cardiac:RRR; no murmurs, rubs, or gallops  Respiratory:  clear to auscultation bilaterally, normal work of breathing  GI: soft, nontender, nondistended, + BS Ext: without cyanosis, clubbing, or edema, Good distal pulses bilaterally Neuro:  Alert and Oriented x 3 Psych: euthymic mood, full affect  Wt Readings from Last 3 Encounters:  01/22/18 208 lb 1.9 oz (94.4 kg)  10/22/17 210 lb (95.3 kg)  09/30/17 209 lb (94.8 kg)      Studies/Labs Reviewed:   EKG:  EKG is  ordered today.  The ekg ordered today demonstrates normal sinus rhythm, inferior Q waves, no acute change  Recent Labs: 01/25/2017: TSH 3.80 08/16/2017: ALT 37; Platelets 203 10/22/2017: BUN 15; Creatinine, Ser 1.20; Hemoglobin 15.0; Potassium 4.0; Sodium 141   Lipid Panel    Component Value Date/Time   CHOL 123 08/16/2017 1004   TRIG 219 (H) 08/16/2017 1004   HDL 28 (L) 08/16/2017 1004   CHOLHDL 4.4 08/16/2017 1004   CHOLHDL 4.0 01/25/2017 1129   LDLCALC 51 08/16/2017 1004   LDLCALC 73 01/25/2017 1129    Additional studies/ records that were reviewed today include:      ASSESSMENT:    1. Atherosclerosis of native coronary artery of native heart without angina pectoris   2. Essential hypertension   3. PAD (peripheral artery disease) (HCC)   4. Hyperlipidemia, unspecified hyperlipidemia type      PLAN:  In order of problems listed above:  CAD status post inferior MI 2009  treated with BMS to the RCA no angina.  Recommend 20 to 30 minutes of exercise daily.  Follow-up with Dr. Excell Seltzer in 1 year.  Essential hypertension pressure well controlled on metoprolol  PAD status post recent angioplasty of the left bypass graft followed closely by Dr. Myra Gianotti on Coumadin   Hyperlipidemia on Lipitor LDL at goal when checked in July triglycerides elevated now on fish oil  Medication Adjustments/Labs and Tests Ordered: Current medicines are reviewed at length with the patient today.  Concerns regarding medicines are outlined above.  Medication changes, Labs and Tests ordered today are listed in the Patient Instructions below. Patient Instructions  Medication Instructions:  Your physician recommends that you continue on your current medications as directed. Please refer to the Current Medication list given to you today.  If you need a refill on your cardiac medications before your next appointment, please call your pharmacy.   Lab work: None ordered  If you have labs (blood work) drawn today and your tests are completely normal, you will receive your results only by: Marland Kitchen MyChart Message (if you have MyChart) OR . A paper copy in the mail If you have any lab test that is abnormal or we need to change your treatment, we will call you to review the results.  Testing/Procedures: None ordered  Follow-Up: At [facility], you and your health needs are our priority.  As part of our continuing mission to provide you with exceptional heart care, we have created designated Provider Care Teams.  These Care Teams include your primary Cardiologist (physician) and Advanced Practice Providers (APPs -  Physician Assistants and Nurse Practitioners) who all work together to provide you with the care you need, when you need it. You will need a follow up appointment in: 12 months.  Please call our office 2 months in advance to schedule this appointment.  You may see Tonny Bollman, MD or  one of the following Advanced Practice Providers on your designated Care Team: Tereso Newcomer, PA-C [place], [place] . Berton Bon, NP  Any Other Special Instructions Will Be Listed Below (If Applicable). CALL IN July  TO MAKE AN APPOINTMENT WITH DR. Excell Seltzer        Signed, Jacolyn Reedy, PA-C  01/22/2018 10:28 AM    [facility] [address] [place], [place], [place]  [address] Phone: [phone]; Fax: [phone]

## 2018-01-22 ENCOUNTER — Ambulatory Visit (INDEPENDENT_AMBULATORY_CARE_PROVIDER_SITE_OTHER): Payer: Medicare Other | Admitting: Physician Assistant

## 2018-01-22 ENCOUNTER — Encounter: Payer: Self-pay | Admitting: Physician Assistant

## 2018-01-22 VITALS — BP 114/70 | HR 61 | Ht 74.0 in | Wt 208.1 lb

## 2018-01-22 DIAGNOSIS — I779 Disorder of arteries and arterioles, unspecified: Secondary | ICD-10-CM | POA: Diagnosis not present

## 2018-01-22 DIAGNOSIS — E785 Hyperlipidemia, unspecified: Secondary | ICD-10-CM | POA: Diagnosis not present

## 2018-01-22 DIAGNOSIS — I2510 Atherosclerotic heart disease of native coronary artery without angina pectoris: Secondary | ICD-10-CM

## 2018-01-22 DIAGNOSIS — I10 Essential (primary) hypertension: Secondary | ICD-10-CM | POA: Diagnosis not present

## 2018-01-22 DIAGNOSIS — I739 Peripheral vascular disease, unspecified: Secondary | ICD-10-CM | POA: Diagnosis not present

## 2018-01-22 NOTE — Patient Instructions (Addendum)
Medication Instructions:  Your physician recommends that you continue on your current medications as directed. Please refer to the Current Medication list given to you today.  If you need a refill on your cardiac medications before your next appointment, please call your pharmacy.   Lab work: None ordered  If you have labs (blood work) drawn today and your tests are completely normal, you will receive your results only by: Marland Kitchen. MyChart Message (if you have MyChart) OR . A paper copy in the mail If you have any lab test that is abnormal or we need to change your treatment, we will call you to review the results.  Testing/Procedures: None ordered  Follow-Up: At [facility], you and your health needs are our priority.  As part of our continuing mission to provide you with exceptional heart care, we have created designated Provider Care Teams.  These Care Teams include your primary Cardiologist (physician) and Advanced Practice Providers (APPs -  Physician Assistants and Nurse Practitioners) who all work together to provide you with the care you need, when you need it. You will need a follow up appointment in: 12 months.  Please call our office 2 months in advance to schedule this appointment.  You may see Tonny BollmanMichael Cooper, MD or one of the following Advanced Practice Providers on your designated Care Team: Tereso NewcomerScott Weaver, PA-C [place], [place] . Berton BonJanine Hammond, NP  Any Other Special Instructions Will Be Listed Below (If Applicable). CALL IN July TO MAKE AN APPOINTMENT WITH DR. Excell SeltzerOOPER

## 2018-01-27 NOTE — Progress Notes (Signed)
Patient: [name] [name] Male    DOB: [birth year]   72 y.o.   MRN: [id number] Visit Date: 01/27/2018  Today's Provider: Dortha Kern, PA   Chief Complaint  Patient presents with  . Follow-up  . Hypertension  . Hyperlipidemia   Subjective:    Patient saw McKenzie for AVW today at 9:20 am.  HPI    Hypertension, follow-up:  BP Readings from Last 3 Encounters:  01/22/18 114/70  10/22/17 109/63  09/30/17 108/63    He was last seen for hypertension 1 years ago.  BP at that visit was 108/66. Management since that visit includes; labs checked, no changes.He reports good compliance with treatment. He is not having side effects. none He is not exercising. He is adherent to low salt diet.   Outside blood pressures are normal. He is experiencing none.  Patient denies none.   Cardiovascular risk factors include advanced age (older than 7 for men, 79 for women).  Use of agents associated with hypertension: none.   ---------------------------------------------------------------    Lipid/Cholesterol, Follow-up:   Last seen for this 1 years ago.  Management since that visit includes; labs checked. Advised to add Co-Q 10 100 mg qd.  Last Lipid Panel:    Component Value Date/Time   CHOL 123 08/16/2017 1004   TRIG 219 (H) 08/16/2017 1004   HDL 28 (L) 08/16/2017 1004   CHOLHDL 4.4 08/16/2017 1004   CHOLHDL 4.0 01/25/2017 1129   LDLCALC 51 08/16/2017 1004   LDLCALC 73 01/25/2017 1129    He reports good compliance with treatment. He is not having side effects. none  Wt Readings from Last 3 Encounters:  01/22/18 208 lb 1.9 oz (94.4 kg)  10/22/17 210 lb (95.3 kg)  09/30/17 209 lb (94.8 kg)    ---------------------------------------------------------------    Deep vein thrombosis (DVT) (HCC) From 01/14/2018-INR 3.5. Changes made, advised to hold todays dose then Continue 7.5mg  daily, except 10mg  on Monday.   PAD (peripheral artery disease) (HCC) From  01/25/2017-Stable, no changes.   Aneurysm of abdominal vessel (HCC) From 01/25/2017-no changes. Followed by vascular specilist (Dr. Myra Gianotti) routinely.  Prostate enlargement From 01/25/2017-PSA lab checked showing elevated level. Advised if having any troubles with urinary frequency, difficulty getting urine to flow, getting up at night to urinate or leakage, will need referral to urologist.  Past Medical History:  Diagnosis Date  . Abdominal aortic aneurysm (HCC)   . Chronic kidney disease    Right kidney stone  . Coronary artery disease   . Detached retina   . Hyperlipidemia   . Hypertension   . Myocardial infarction [facility])    2007 heart stent  . Popliteal aneurysm [facility])    Past Surgical History:  Procedure Laterality Date  . ABDOMINAL AORTIC ANEURYSM REPAIR  11/24/09   EVAR  . ABDOMINAL AORTOGRAM W/LOWER EXTREMITY N/A 10/22/2017   Procedure: ABDOMINAL AORTOGRAM W/LOWER EXTREMITY;  Surgeon: Nada Libman, MD;  [facility];  Service: Cardiovascular;  Laterality: N/A;  . AMPUTATION Left 12/10/2013   Procedure: AMPUTATION LEFT  FOURTH AND FIFTH TOES;  Surgeon: Nada Libman, MD;  Location: [facility] OR;  Service: Vascular;  Laterality: Left;  . COLONOSCOPY    . coronary stenting     2007  . EYE SURGERY Right    cataract removed  . FEMORAL-POPLITEAL BYPASS GRAFT Left 10/29/2013   Procedure: BYPASS GRAFT FEMORAL-POPLITEAL ARTERY;  Surgeon: Nada Libman, MD;  Location: [facility] OR;  Service: Vascular;  Laterality:  Left;  Left Femoral to Posterior Tibial artery bypass graft using nonreversed saphenous vein. and thrombectomy of Tibial arterly.  . FEMORAL-TIBIAL BYPASS GRAFT Left 10/30/2013   Procedure: Reexploration of Femoral-Posterior Tibial Bypass; Thrombectomy of Bypass; Left Leg Angiogram; Posterior Tibial Vein Patch Angioplasty;  Surgeon: Nada Libman, MD;  [facility] OR;  Service: Vascular;  Laterality: Left;  . LOWER EXTREMITY ANGIOGRAM N/A 05/25/2014    Procedure: LOWER EXTREMITY ANGIOGRAM;  Surgeon: Nada Libman, MD;  [facility] CATH LAB;  Service: Cardiovascular;  Laterality: N/A;  . PERIPHERAL VASCULAR BALLOON ANGIOPLASTY Left 10/22/2017   Procedure: PERIPHERAL VASCULAR BALLOON ANGIOPLASTY;  Surgeon: Nada Libman, MD;  [facility];  Service: Cardiovascular;  Laterality: Left;  fem pop bypass  . PERIPHERAL VASCULAR CATHETERIZATION Left 10/04/2015   Procedure: Lower Extremity Angiography;  Surgeon: Nada Libman, MD;  Location: [facility] INVASIVE CV LAB;  Service: Cardiovascular;  Laterality: Left;  . PERIPHERAL VASCULAR CATHETERIZATION Left 10/04/2015   Procedure: Peripheral Vascular Intervention;  Surgeon: Nada Libman, MD;  Location: [facility] INVASIVE CV LAB;  Service: Cardiovascular;  Laterality: Left;  . POPLITEAL ARTERY STENT  11/2009   left popliteal artery   . RETINAL DETACHMENT SURGERY Right 2011  . TOOTH EXTRACTION  June 2015   Wisdom Tooth Extraction   Family History  Problem Relation Age of Onset  . Aneurysm Sister        possibly   . Peripheral vascular disease Brother    Allergies  Allergen Reactions  . Omnipaque [Iohexol] Rash and Other (See Comments)    Pt requires full premeds and needs to bring a driver.  Broke out in rash on back and arms.  Ok w/ premeds  . Oxytetracycline Rash and Other (See Comments)    Terramycin causes rash  . Alteplase Other (See Comments)    Internal bleeding, per RN in short stay. Patient requested this to be added to his allergy list    Current Outpatient Medications:  .  atorvastatin (LIPITOR) 40 MG tablet, TAKE 1 TABLET BY MOUTH ONCE DAILY, Disp: 90 tablet, Rfl: 0 .  metoprolol tartrate (LOPRESSOR) 25 MG tablet, TAKE 1 TABLET BY MOUTH TWICE A DAY, Disp: 60 tablet, Rfl: 11 .  Multiple Vitamin (MULTIVITAMIN WITH MINERALS) TABS tablet, Take 1 tablet by mouth daily., Disp: , Rfl:  .  Omega-3 Fatty Acids (FISH OIL TRIPLE STRENGTH) 1400 MG CAPS, Take 2,800 mg by mouth 2 (two)  times daily., Disp: , Rfl:  .  Saw Palmetto 450 MG CAPS, Take 900 mg by mouth 2 (two) times daily., Disp: , Rfl:  .  warfarin (COUMADIN) 5 MG tablet, TAKE 1.5 TABLETS BY MOUTH ONCE DAILY EXCEPT TAKE 2 TABLETS ON MONDAY AND FRIDAY (Patient taking differently: Take 7.5-10 mg by mouth See admin instructions. Take 10 mg by mouth daily on Monday and Friday. Take 7.5 mg by mouth daily on all other days.), Disp: 169 tablet, Rfl: 2  Review of Systems  All other systems reviewed and are negative.  Social History   Tobacco Use  . Smoking status: Former Smoker    Years: 15.00    Types: Cigarettes    Last attempt to quit: 09/21/1985    Years since quitting: 32.3  . Smokeless tobacco: Current User    Types: Chew  Substance Use Topics  . Alcohol use: Yes    Comment: 1 beer ocassionally (mostly in summer)     Objective:   Vitals:   BP 106/56 (BP Location: Right Arm)  Pulse 62   Temp 98.8 F (37.1 C) (Oral)   Ht 6\' 2"  (1.88 m)   Wt 214 lb 3.2 oz (97.2 kg)   BMI 27.50 kg/m   BSA 2.25 m    Wt Readings from Last 3 Encounters:  01/28/18 214 lb 3.2 oz (97.2 kg)  01/22/18 208 lb 1.9 oz (94.4 kg)  10/22/17 210 lb (95.3 kg)   Physical Exam Constitutional:      Appearance: He is well-developed.  HENT:     Head: Normocephalic and atraumatic.     Right Ear: External ear normal.     Left Ear: External ear normal.     Nose: Nose normal.  Eyes:     General:        Right eye: No discharge.     Conjunctiva/sclera: Conjunctivae normal.     Pupils: Pupils are equal, round, and reactive to light.  Neck:     Musculoskeletal: Normal range of motion and neck supple.     Thyroid: No thyromegaly.     Trachea: No tracheal deviation.  Cardiovascular:     Rate and Rhythm: Normal rate and regular rhythm.     Heart sounds: Normal heart sounds. No murmur.  Pulmonary:     Effort: Pulmonary effort is normal. No respiratory distress.     Breath sounds: Normal breath sounds. No wheezing or rales.    Chest:     Chest wall: No tenderness.  Abdominal:     General: There is no distension.     Palpations: Abdomen is soft. There is no mass.     Tenderness: There is no abdominal tenderness. There is no guarding or rebound.  Musculoskeletal: Normal range of motion.        General: No tenderness.     Comments: Well healed amputation of the left 4th and 5th toes (2015 due to infection from arterial occlusive disease). Decrease sensation near scar. No open sores/ulcerations. Good pulses now.  Lymphadenopathy:     Cervical: No cervical adenopathy.  Skin:    General: Skin is warm and dry.     Findings: No erythema or rash.  Neurological:     Mental Status: He is alert and oriented to person, place, and time.     Cranial Nerves: No cranial nerve deficit.     Motor: No abnormal muscle tone.     Coordination: Coordination normal.     Deep Tendon Reflexes: Reflexes are normal and symmetric. Reflexes normal.  Psychiatric:        Behavior: Behavior normal.        Thought Content: Thought content normal.        Judgment: Judgment normal.    [facility] 01/28/2018 12/28/2016 11/25/2015  Decreased Interest 0 0 0  Down, Depressed, Hopeless 0 0 0  PHQ - 2 Score 0 0 0       Assessment & Plan    1. PAD (peripheral artery disease) (HCC) Stable INR today at 2.6. Will continue present Coumadin 7.5 mg qd except 10 mg every Monday and recheck protime in a month. Had Fem-Pop Bypass 10-29-13, Fem-Tib bypass 10-30-13 and left leg angioplasty by Dr. Myra GianottiBrabham on 10-22-17. Occlusive disease in 2015 resulted in amputation of the left 4th and 5th toes - well healed. Recheck CBC. Completed AWV today with Nurse Health Advisor. - POCT INR - CBC with Differential/Platelet  2. Atherosclerosis of native coronary artery of native heart without angina pectoris History of MI in 2009 with bare metal stent  RCA (Dr. Excell Seltzer) and history of AAA and left popliteal aneurysm. Continue follow up with cardiologist  and vascular surgeon.  3. Essential hypertension Stable and well controlled on Metoprolol Tartrate 25 mg BID. Restrict salt intake and get follow up labs. - CBC with Differential/Platelet - Comprehensive metabolic panel - TSH  4. Hyperlipidemia, mixed Still tolerating the Lipitor 40 mg qd and trying to follow a low fat diet. Recheck Lipid Panel and TSH today.  - Lipid panel - TSH  5. Need for Streptococcus pneumoniae vaccination - Pneumococcal polysaccharide vaccine 23-valent greater than or equal to 2yo subcutaneous/IM     Dortha Kern, PA  [facility]

## 2018-01-28 ENCOUNTER — Ambulatory Visit: Payer: Medicare Other

## 2018-01-28 ENCOUNTER — Ambulatory Visit (INDEPENDENT_AMBULATORY_CARE_PROVIDER_SITE_OTHER): Payer: Medicare Other | Admitting: Family Medicine

## 2018-01-28 ENCOUNTER — Ambulatory Visit (INDEPENDENT_AMBULATORY_CARE_PROVIDER_SITE_OTHER): Payer: Medicare Other

## 2018-01-28 VITALS — BP 106/56 | HR 62 | Temp 98.8°F | Wt 214.2 lb

## 2018-01-28 VITALS — BP 106/56 | HR 62 | Temp 98.8°F | Ht 74.0 in | Wt 214.2 lb

## 2018-01-28 DIAGNOSIS — Z0000 Encounter for general adult medical examination without abnormal findings: Secondary | ICD-10-CM

## 2018-01-28 DIAGNOSIS — I10 Essential (primary) hypertension: Secondary | ICD-10-CM

## 2018-01-28 DIAGNOSIS — I779 Disorder of arteries and arterioles, unspecified: Secondary | ICD-10-CM

## 2018-01-28 DIAGNOSIS — I2510 Atherosclerotic heart disease of native coronary artery without angina pectoris: Secondary | ICD-10-CM

## 2018-01-28 DIAGNOSIS — I739 Peripheral vascular disease, unspecified: Secondary | ICD-10-CM | POA: Diagnosis not present

## 2018-01-28 DIAGNOSIS — Z23 Encounter for immunization: Secondary | ICD-10-CM

## 2018-01-28 DIAGNOSIS — E782 Mixed hyperlipidemia: Secondary | ICD-10-CM

## 2018-01-28 LAB — POCT INR
INR: 2.6 (ref 2.0–3.0)
PT: 30.1

## 2018-01-28 NOTE — Progress Notes (Addendum)
Subjective:   [name] [name] is a 72 y.o. male who presents for Medicare Annual/Subsequent preventive examination.  Review of Systems:  N/A  Cardiac Risk Factors include: advanced age (>43men, >69 women);dyslipidemia;hypertension;male gender     Objective:    Vitals: BP (!) 106/56 (BP Location: Right Arm)   Pulse 62   Temp 98.8 F (37.1 C) (Oral)   Ht 6\' 2"  (1.88 m)   Wt 214 lb 3.2 oz (97.2 kg)   BMI 27.50 kg/m   Body mass index is 27.5 kg/m.  Advanced Directives 01/28/2018 10/22/2017 12/28/2016 12/28/2016 07/23/2016 01/09/2016 10/04/2015  Does Patient Have a Medical Advance Directive? No No No No Yes No No  Does patient want to make changes to medical advance directive? - - - - - - -  Would patient like information on creating a medical advance directive? No - Patient declined No - Patient declined Yes (ED - Information included in AVS) - - No - Patient declined No - patient declined information    Tobacco Social History   Tobacco Use  Smoking Status Former Smoker  . Years: 15.00  . Types: Cigarettes  . Last attempt to quit: 09/21/1985  . Years since quitting: 32.3  Smokeless Tobacco Current User  . Types: Chew     Ready to quit: Not Answered Counseling given: Not Answered   Clinical Intake:  Pre-visit preparation completed: Yes  Pain : No/denies pain Pain Score: 0-No pain     Nutritional Status: BMI 25 -29 Overweight Nutritional Risks: None Diabetes: No  How often do you need to have someone help you when you read instructions, pamphlets, or other written materials from your doctor or pharmacy?: 1 - Never  Interpreter Needed?: No  Information entered by :: [facility], LPN  Past Medical History:  Diagnosis Date  . Abdominal aortic aneurysm (HCC)   . Chronic kidney disease    Right kidney stone  . Coronary artery disease   . Detached retina   . Hyperlipidemia   . Hypertension   . Myocardial infarction [facility])    2007 heart stent  . Popliteal  aneurysm [facility])    Past Surgical History:  Procedure Laterality Date  . ABDOMINAL AORTIC ANEURYSM REPAIR  11/24/09   EVAR  . ABDOMINAL AORTOGRAM W/LOWER EXTREMITY N/A 10/22/2017   Procedure: ABDOMINAL AORTOGRAM W/LOWER EXTREMITY;  Surgeon: Nada Libman, MD;  Location: [facility] INVASIVE CV LAB;  Service: Cardiovascular;  Laterality: N/A;  . AMPUTATION Left 12/10/2013   Procedure: AMPUTATION LEFT  FOURTH AND FIFTH TOES;  Surgeon: Nada Libman, MD;  Location: [facility] OR;  Service: Vascular;  Laterality: Left;  . COLONOSCOPY    . coronary stenting     2007  . EYE SURGERY Right    cataract removed  . FEMORAL-POPLITEAL BYPASS GRAFT Left 10/29/2013   Procedure: BYPASS GRAFT FEMORAL-POPLITEAL ARTERY;  Surgeon: Nada Libman, MD;  Location: [facility] OR;  Service: Vascular;  Laterality: Left;  Left Femoral to Posterior Tibial artery bypass graft using nonreversed saphenous vein. and thrombectomy of Tibial arterly.  . FEMORAL-TIBIAL BYPASS GRAFT Left 10/30/2013   Procedure: Reexploration of Femoral-Posterior Tibial Bypass; Thrombectomy of Bypass; Left Leg Angiogram; Posterior Tibial Vein Patch Angioplasty;  Surgeon: Nada Libman, MD;  Location: [facility] OR;  Service: Vascular;  Laterality: Left;  . LOWER EXTREMITY ANGIOGRAM N/A 05/25/2014   Procedure: LOWER EXTREMITY ANGIOGRAM;  Surgeon: Nada Libman, MD;  Location: [facility] CATH LAB;  Service: Cardiovascular;  Laterality: N/A;  . PERIPHERAL VASCULAR BALLOON ANGIOPLASTY Left  10/22/2017   Procedure: PERIPHERAL VASCULAR BALLOON ANGIOPLASTY;  Surgeon: Nada Libman, MD;  Location: [facility] INVASIVE CV LAB;  Service: Cardiovascular;  Laterality: Left;  fem pop bypass  . PERIPHERAL VASCULAR CATHETERIZATION Left 10/04/2015   Procedure: Lower Extremity Angiography;  Surgeon: Nada Libman, MD;  Location[facility] INVASIVE CV LAB;  Service: Cardiovascular;  Laterality: Left;  . PERIPHERAL VASCULAR CATHETERIZATION Left 10/04/2015   Procedure: Peripheral Vascular Intervention;  Surgeon:  Nada Libman, MD;  Location: [facility] INVASIVE CV LAB;  Service: Cardiovascular;  Laterality: Left;  . POPLITEAL ARTERY STENT  11/2009   left popliteal artery   . RETINAL DETACHMENT SURGERY Right 2011  . TOOTH EXTRACTION  June 2015   Wisdom Tooth Extraction   Family History  Problem Relation Age of Onset  . Aneurysm Sister        possibly   . Peripheral vascular disease Brother    Social History   Socioeconomic History  . Marital status: Married    Spouse name: Not on file  . Number of children: 1  . Years of education: Not on file  . Highest education level: Associate degree: occupational, Scientist, product/process development, or vocational program  Occupational History  . Occupation: Retired  Engineer, production  . Financial resource strain: Not hard at all  . Food insecurity:    Worry: Never true    Inability: Never true  . Transportation needs:    Medical: No    Non-medical: No  Tobacco Use  . Smoking status: Former Smoker    Years: 15.00    Types: Cigarettes    Last attempt to quit: 09/21/1985    Years since quitting: 32.3  . Smokeless tobacco: Current User    Types: Chew  Substance and Sexual Activity  . Alcohol use: Yes    Comment: 1 beer ocassionally (mostly in summer)  . Drug use: No  . Sexual activity: Not on file  Lifestyle  . Physical activity:    Days per week: 0 days    Minutes per session: 0 min  . Stress: Not at all  Relationships  . Social connections:    Talks on phone: Patient refused    Gets together: Patient refused    Attends religious service: Patient refused    Active member of club or organization: Patient refused    Attends meetings of clubs or organizations: Patient refused    Relationship status: Patient refused  Other Topics Concern  . Not on file  Social History Narrative   Lives with family.    Outpatient Encounter Medications as of 01/28/2018  Medication Sig  . atorvastatin (LIPITOR) 40 MG tablet TAKE 1 TABLET BY MOUTH ONCE DAILY  . metoprolol tartrate  (LOPRESSOR) 25 MG tablet TAKE 1 TABLET BY MOUTH TWICE A DAY  . Multiple Vitamin (MULTIVITAMIN WITH MINERALS) TABS tablet Take 1 tablet by mouth daily.  . Omega-3 Fatty Acids (FISH OIL TRIPLE STRENGTH) 1400 MG CAPS Take 2,800 mg by mouth 2 (two) times daily.  . Saw Palmetto 450 MG CAPS Take 900 mg by mouth 2 (two) times daily.  Marland Kitchen warfarin (COUMADIN) 5 MG tablet TAKE 1.5 TABLETS BY MOUTH ONCE DAILY EXCEPT TAKE 2 TABLETS ON MONDAY AND FRIDAY (Patient taking differently: Take 7.5-10 mg by mouth See admin instructions. Take 10 mg by mouth on Monday. Take 7.5 mg by mouth daily on all other days.)   No facility-administered encounter medications on file as of 01/28/2018.     Activities of Daily Living In your present state  of health, do you have any difficulty performing the following activities: 01/28/2018  Hearing? N  Vision? N  Difficulty concentrating or making decisions? N  Walking or climbing stairs? N  Dressing or bathing? N  Doing errands, shopping? N  Preparing Food and eating ? N  Using the Toilet? N  In the past six months, have you accidently leaked urine? N  Do you have problems with loss of bowel control? N  Managing your Medications? N  Managing your Finances? N  Housekeeping or managing your Housekeeping? N  Some recent data might be hidden    Patient Care Team: [name], Jodell Ciproennis E, PA as PCP - General (Family Medicine) Tonny Bollmanooper, Michael, MD as PCP - Cardiology (Cardiology) Nada LibmanBrabham, Vance W, MD as Consulting Physician (Vascular Surgery)   Assessment:   This is a routine wellness examination for [facility].  Exercise Activities and Dietary recommendations Current Exercise Habits: The patient does not participate in regular exercise at present, Exercise limited by: None identified  Goals    . DIET - INCREASE WATER INTAKE     Recommend increasing water intake to 4-6 glasses a day.       Fall Risk Fall Risk  01/28/2018 12/28/2016 11/25/2015  Falls in the past year? 0 No No   Number falls in past yr: 0 - -  Injury with Fall? 0 - -   FALL RISK PREVENTION PERTAINING TO THE HOME:  Any stairs in or around the home WITH handrails? Yes  Home free of loose throw rugs in walkways, pet beds, electrical cords, etc? Yes  Adequate lighting in your home to reduce risk of falls? Yes   ASSISTIVE DEVICES UTILIZED TO PREVENT FALLS:  Life alert? No  Use of a cane, walker or w/c? No  Grab bars in the bathroom? Yes  Shower chair or bench in shower? No  Elevated toilet seat or a handicapped toilet? No    TIMED UP AND GO:  Was the test performed? No .    Depression Screen PHQ 2/9 Scores 01/28/2018 12/28/2016 11/25/2015  PHQ - 2 Score 0 0 0    Cognitive Function     6CIT Screen 01/28/2018  What Year? 0 points  What month? 0 points  What time? 0 points  Count back from 20 0 points  Months in reverse 0 points  Repeat phrase 2 points  Total Score 2    Immunization History  Administered Date(s) Administered  . Influenza, High Dose Seasonal PF 01/14/2015, 11/25/2015, 11/30/2016, 12/06/2017  . Pneumococcal Conjugate-13 01/25/2017  . Tdap 12/17/2013    Qualifies for Shingles Vaccine? Yes . Due for Shingrix. Education has been provided regarding the importance of this vaccine. Pt has been advised to call insurance company to determine out of pocket expense. Advised may also receive vaccine at local pharmacy or Health Dept. Verbalized acceptance and understanding.  Tdap: Up to date  Flu Vaccine: Up to date  Pneumococcal Vaccine: Prevnar 13 up to date. Declined Pneumovax 23 today.     Screening Tests Health Maintenance  Topic Date Due  . HEMOGLOBIN A1C  04/24/2014  . PNA vac Low Risk Adult (2 of 2 - PPSV23) 01/25/2018  . Fecal DNA (Cologuard)  02/22/2020  . TETANUS/TDAP  12/18/2023  . INFLUENZA VACCINE  Completed  . Hepatitis C Screening  Completed   Cancer Screenings:  Colorectal Screening: Cologuard completed 02/21/17. Repeat every 3 years.  Lung  Cancer Screening: (Low Dose CT Chest recommended if Age 66-80 years, 30 pack-year currently smoking OR  have quit w/in 15years.) does not qualify.    Additional Screening:  Hepatitis C Screening: Up to date  Vision Screening: Recommended annual ophthalmology exams for early detection of glaucoma and other disorders of the eye.  Dental Screening: Recommended annual dental exams for proper oral hygiene  Community Resource Referral:  CRR required this visit?  No        Plan:  I have personally reviewed and addressed the Medicare Annual Wellness questionnaire and have noted the following in the patient's chart:  A. Medical and social history B. Use of alcohol, tobacco or illicit drugs  C. Current medications and supplements D. Functional ability and status E.  Nutritional status F.  Physical activity G. Advance directives H. List of other physicians I.  Hospitalizations, surgeries, and ER visits in previous 12 months J.  Vitals K. Screenings such as hearing and vision if needed, cognitive and depression L. Referrals and appointments - none  In addition, I have reviewed and discussed with patient certain preventive protocols, quality metrics, and best practice recommendations. A written personalized care plan for preventive services as well as general preventive health recommendations were provided to patient.  See attached scanned questionnaire for additional information.   Signed,  Hyacinth Meeker, LPN Nurse Health Advisor   Nurse Recommendations: Pt declined the Pneumovax 23 vaccine today. Pt needs his Hgb A1c checked today.   Reviewed documentation and recommendations from Nurse Health Advisor's screening. Was available for consultation. Agree with note and plan.

## 2018-01-28 NOTE — Patient Instructions (Addendum)
Mr. [name] [name] , Thank you for taking time to come for your Medicare Wellness Visit. I appreciate your ongoing commitment to your health goals. Please review the following plan we discussed and let me know if I can assist you in the future.   Screening recommendations/referrals: Colonoscopy: Up to date, cologuard due 02/22/20 Recommended yearly ophthalmology/optometry visit for glaucoma screening and checkup Recommended yearly dental visit for hygiene and checkup  Vaccinations: Influenza vaccine: Up to date Pneumococcal vaccine: Prevnar 13 up to date, declined Pneumovax 23 today.  Tdap vaccine: Up to date, due 12/2023 Shingles vaccine: Pt declines today.     Advanced directives: Advance directive discussed with you today. Even though you declined this today please call our office should you change your mind and we can give you the proper paperwork for you to fill out.  Conditions/risks identified: Continue increasing water intake to reach 6-8 eight oz glasses a day.   Next appointment: 10:00 AM today with Dortha Kernennis [name].  Preventive Care 9465 Years and Older, Male Preventive care refers to lifestyle choices and visits with your health care provider that can promote health and wellness. What does preventive care include?  A yearly physical exam. This is also called an annual well check.  Dental exams once or twice a year.  Routine eye exams. Ask your health care provider how often you should have your eyes checked.  Personal lifestyle choices, including:  Daily care of your teeth and gums.  Regular physical activity.  Eating a healthy diet.  Avoiding tobacco and drug use.  Limiting alcohol use.  Practicing safe sex.  Taking low doses of aspirin every day.  Taking vitamin and mineral supplements as recommended by your health care provider. What happens during an annual well check? The services and screenings done by your health care provider during your annual well check  will depend on your age, overall health, lifestyle risk factors, and family history of disease. Counseling  Your health care provider may ask you questions about your:  Alcohol use.  Tobacco use.  Drug use.  Emotional well-being.  Home and relationship well-being.  Sexual activity.  Eating habits.  History of falls.  Memory and ability to understand (cognition).  Work and work Astronomerenvironment. Screening  You may have the following tests or measurements:  Height, weight, and BMI.  Blood pressure.  Lipid and cholesterol levels. These may be checked every 5 years, or more frequently if you are over 74 years old.  Skin check.  Lung cancer screening. You may have this screening every year starting at age 74 if you have a 30-pack-year history of smoking and currently smoke or have quit within the past 15 years.  Fecal occult blood test (FOBT) of the stool. You may have this test every year starting at age 74.  Flexible sigmoidoscopy or colonoscopy. You may have a sigmoidoscopy every 5 years or a colonoscopy every 10 years starting at age 74.  Prostate cancer screening. Recommendations will vary depending on your family history and other risks.  Hepatitis C blood test.  Hepatitis B blood test.  Sexually transmitted disease (STD) testing.  Diabetes screening. This is done by checking your blood sugar (glucose) after you have not eaten for a while (fasting). You may have this done every 1-3 years.  Abdominal aortic aneurysm (AAA) screening. You may need this if you are a current or former smoker.  Osteoporosis. You may be screened starting at age 74 if you are at high risk. Talk with your  health care provider about your test results, treatment options, and if necessary, the need for more tests. Vaccines  Your health care provider may recommend certain vaccines, such as:  Influenza vaccine. This is recommended every year.  Tetanus, diphtheria, and acellular pertussis  (Tdap, Td) vaccine. You may need a Td booster every 10 years.  Zoster vaccine. You may need this after age 18.  Pneumococcal 13-valent conjugate (PCV13) vaccine. One dose is recommended after age 9.  Pneumococcal polysaccharide (PPSV23) vaccine. One dose is recommended after age 70. Talk to your health care provider about which screenings and vaccines you need and how often you need them. This information is not intended to replace advice given to you by your health care provider. Make sure you discuss any questions you have with your health care provider. Document Released: 02/25/2015 Document Revised: 10/19/2015 Document Reviewed: 11/30/2014 Elsevier Interactive Patient Education  2017 [place] Prevention in the Home Falls can cause injuries. They can happen to people of all ages. There are many things you can do to make your home safe and to help prevent falls. What can I do on the outside of my home?  Regularly fix the edges of walkways and driveways and fix any cracks.  Remove anything that might make you trip as you walk through a door, such as a raised step or threshold.  Trim any bushes or trees on the path to your home.  Use bright outdoor lighting.  Clear any walking paths of anything that might make someone trip, such as rocks or tools.  Regularly check to see if handrails are loose or broken. Make sure that both sides of any steps have handrails.  Any raised decks and porches should have guardrails on the edges.  Have any leaves, snow, or ice cleared regularly.  Use sand or salt on walking paths during winter.  Clean up any spills in your garage right away. This includes oil or grease spills. What can I do in the bathroom?  Use night lights.  Install grab bars by the toilet and in the tub and shower. Do not use towel bars as grab bars.  Use non-skid mats or decals in the tub or shower.  If you need to sit down in the shower, use a plastic, non-slip  stool.  Keep the floor dry. Clean up any water that spills on the floor as soon as it happens.  Remove soap buildup in the tub or shower regularly.  Attach bath mats securely with double-sided non-slip rug tape.  Do not have throw rugs and other things on the floor that can make you trip. What can I do in the bedroom?  Use night lights.  Make sure that you have a light by your bed that is easy to reach.  Do not use any sheets or blankets that are too big for your bed. They should not hang down onto the floor.  Have a firm chair that has side arms. You can use this for support while you get dressed.  Do not have throw rugs and other things on the floor that can make you trip. What can I do in the kitchen?  Clean up any spills right away.  Avoid walking on wet floors.  Keep items that you use a lot in easy-to-reach places.  If you need to reach something above you, use a strong step stool that has a grab bar.  Keep electrical cords out of the way.  Do not use  floor polish or wax that makes floors slippery. If you must use wax, use non-skid floor wax.  Do not have throw rugs and other things on the floor that can make you trip. What can I do with my stairs?  Do not leave any items on the stairs.  Make sure that there are handrails on both sides of the stairs and use them. Fix handrails that are broken or loose. Make sure that handrails are as long as the stairways.  Check any carpeting to make sure that it is firmly attached to the stairs. Fix any carpet that is loose or worn.  Avoid having throw rugs at the top or bottom of the stairs. If you do have throw rugs, attach them to the floor with carpet tape.  Make sure that you have a light switch at the top of the stairs and the bottom of the stairs. If you do not have them, ask someone to add them for you. What else can I do to help prevent falls?  Wear shoes that:  Do not have high heels.  Have rubber bottoms.  Are  comfortable and fit you well.  Are closed at the toe. Do not wear sandals.  If you use a stepladder:  Make sure that it is fully opened. Do not climb a closed stepladder.  Make sure that both sides of the stepladder are locked into place.  Ask someone to hold it for you, if possible.  Clearly mark and make sure that you can see:  Any grab bars or handrails.  First and last steps.  Where the edge of each step is.  Use tools that help you move around (mobility aids) if they are needed. These include:  Canes.  Walkers.  Scooters.  Crutches.  Turn on the lights when you go into a dark area. Replace any light bulbs as soon as they burn out.  Set up your furniture so you have a clear path. Avoid moving your furniture around.  If any of your floors are uneven, fix them.  If there are any pets around you, be aware of where they are.  Review your medicines with your doctor. Some medicines can make you feel dizzy. This can increase your chance of falling. Ask your doctor what other things that you can do to help prevent falls. This information is not intended to replace advice given to you by your health care provider. Make sure you discuss any questions you have with your health care provider. Document Released: 11/25/2008 Document Revised: 07/07/2015 Document Reviewed: 03/05/2014 Elsevier Interactive Patient Education  2017 [employer].

## 2018-01-28 NOTE — Patient Instructions (Signed)
Continue 7.5mg  daily, except 10mg  on Monday. Recheck in 4 weeks.

## 2018-01-31 ENCOUNTER — Encounter: Payer: Self-pay | Admitting: Family Medicine

## 2018-03-04 ENCOUNTER — Ambulatory Visit (INDEPENDENT_AMBULATORY_CARE_PROVIDER_SITE_OTHER): Payer: Medicare Other | Admitting: Family Medicine

## 2018-03-04 ENCOUNTER — Encounter: Payer: Self-pay | Admitting: Family Medicine

## 2018-03-04 DIAGNOSIS — E782 Mixed hyperlipidemia: Secondary | ICD-10-CM | POA: Diagnosis not present

## 2018-03-04 DIAGNOSIS — I739 Peripheral vascular disease, unspecified: Secondary | ICD-10-CM

## 2018-03-04 DIAGNOSIS — I10 Essential (primary) hypertension: Secondary | ICD-10-CM | POA: Diagnosis not present

## 2018-03-04 LAB — POCT INR
INR: 2.4 (ref 2.0–3.0)
PT: 29.4

## 2018-03-04 NOTE — Progress Notes (Signed)
Minor INR change. Recommend he limit Vitamin K rich foods and continue present Coumadin dosage (10 mg every Monday and 7.5 mg all other days). 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 Recheck protime/INR in 1 month.

## 2018-03-04 NOTE — Progress Notes (Deleted)
       Patient: [name] [name] Male    DOB: 27-Jul-1945   73 y.o.   MRN: [id number] Visit Date: 03/04/2018  Today's Provider: Dortha Kern, PA   No chief complaint on file.  Subjective:     HPI  Patient here for his PT/INR. Patient was seen last month for Hypertension follow-up and other issues. Patient need labs done.  Lab Results  Component Value Date   INR 2.4 03/04/2018   INR 2.6 01/28/2018   INR 3.5 (A) 01/14/2018    Allergies  Allergen Reactions  . Omnipaque [Iohexol] Rash and Other (See Comments)    Pt requires full premeds and needs to bring a driver.  Broke out in rash on back and arms.  Ok w/ premeds  . Oxytetracycline Rash and Other (See Comments)    Terramycin causes rash  . Alteplase Other (See Comments)    Internal bleeding, per RN in short stay. Patient requested this to be added to his allergy list     Current Outpatient Medications:  .  atorvastatin (LIPITOR) 40 MG tablet, TAKE 1 TABLET BY MOUTH ONCE DAILY, Disp: 90 tablet, Rfl: 0 .  metoprolol tartrate (LOPRESSOR) 25 MG tablet, TAKE 1 TABLET BY MOUTH TWICE A DAY, Disp: 60 tablet, Rfl: 11 .  Multiple Vitamin (MULTIVITAMIN WITH MINERALS) TABS tablet, Take 1 tablet by mouth daily., Disp: , Rfl:  .  Omega-3 Fatty Acids (FISH OIL TRIPLE STRENGTH) 1400 MG CAPS, Take 2,800 mg by mouth 2 (two) times daily., Disp: , Rfl:  .  Saw Palmetto 450 MG CAPS, Take 900 mg by mouth 2 (two) times daily., Disp: , Rfl:  .  warfarin (COUMADIN) 5 MG tablet, TAKE 1.5 TABLETS BY MOUTH ONCE DAILY EXCEPT TAKE 2 TABLETS ON MONDAY AND FRIDAY (Patient taking differently: Take 7.5-10 mg by mouth See admin instructions. Take 10 mg by mouth on Monday. Take 7.5 mg by mouth daily on all other days.), Disp: 169 tablet, Rfl: 2  Review of Systems  Social History   Tobacco Use  . Smoking status: Former Smoker    Years: 15.00    Types: Cigarettes    Last attempt to quit: 09/21/1985    Years since quitting: 32.4  . Smokeless tobacco:  Current User    Types: Chew  Substance Use Topics  . Alcohol use: Yes    Comment: 1 beer ocassionally (mostly in summer)      Objective:   BP 120/60 (BP Location: Right Arm, Patient Position: Sitting, Cuff Size: Large)   Pulse 62   Temp 97.6 F (36.4 C) (Oral)   Resp 16   Wt 205 lb 9.6 oz (93.3 kg)   SpO2 96%   BMI 26.40 kg/m  Vitals:   03/04/18 0931  BP: 120/60  Pulse: 62  Resp: 16  Temp: 97.6 F (36.4 C)  TempSrc: Oral  SpO2: 96%  Weight: 205 lb 9.6 oz (93.3 kg)     Physical Exam      Assessment & Plan        Dortha Kern, PA  [facility]

## 2018-03-04 NOTE — Patient Instructions (Signed)
Description   Continue 7.5mg  daily, except 10mg  on Monday. Recheck in 4 weeks.

## 2018-03-05 LAB — CBC WITH DIFFERENTIAL/PLATELET
BASOS: 1 %
Basophils Absolute: 0 10*3/uL (ref 0.0–0.2)
EOS (ABSOLUTE): 0.2 10*3/uL (ref 0.0–0.4)
Eos: 4 %
Hematocrit: 43.3 % (ref 37.5–51.0)
Hemoglobin: 15.3 g/dL (ref 13.0–17.7)
Immature Grans (Abs): 0 10*3/uL (ref 0.0–0.1)
Immature Granulocytes: 1 %
Lymphocytes Absolute: 1.1 10*3/uL (ref 0.7–3.1)
Lymphs: 21 %
MCH: 31.7 pg (ref 26.6–33.0)
MCHC: 35.3 g/dL (ref 31.5–35.7)
MCV: 90 fL (ref 79–97)
Monocytes Absolute: 0.6 10*3/uL (ref 0.1–0.9)
Monocytes: 13 %
Neutrophils Absolute: 3.1 10*3/uL (ref 1.4–7.0)
Neutrophils: 60 %
Platelets: 285 10*3/uL (ref 150–450)
RBC: 4.82 x10E6/uL (ref 4.14–5.80)
RDW: 13.1 % (ref 11.6–15.4)
WBC: 5 10*3/uL (ref 3.4–10.8)

## 2018-03-05 LAB — LIPID PANEL
Chol/HDL Ratio: 3.5 ratio (ref 0.0–5.0)
Cholesterol, Total: 108 mg/dL (ref 100–199)
HDL: 31 mg/dL — ABNORMAL LOW (ref >39)
LDL Calculated: 53 mg/dL (ref 0–99)
Triglycerides: 118 mg/dL (ref 0–149)
VLDL Cholesterol Cal: 24 mg/dL (ref 5–40)

## 2018-03-05 LAB — COMPREHENSIVE METABOLIC PANEL
ALT: 38 IU/L (ref 0–44)
AST: 39 IU/L (ref 0–40)
Albumin/Globulin Ratio: 1.8 (ref 1.2–2.2)
Albumin: 4.2 g/dL (ref 3.7–4.7)
Alkaline Phosphatase: 75 IU/L (ref 39–117)
BUN/Creatinine Ratio: 11 (ref 10–24)
BUN: 12 mg/dL (ref 8–27)
Bilirubin Total: 1.4 mg/dL — ABNORMAL HIGH (ref 0.0–1.2)
CO2: 22 mmol/L (ref 20–29)
Calcium: 9.4 mg/dL (ref 8.6–10.2)
Chloride: 106 mmol/L (ref 96–106)
Creatinine, Ser: 1.11 mg/dL (ref 0.76–1.27)
GFR calc Af Amer: 75 mL/min/{1.73_m2} (ref >59)
GFR calc non Af Amer: 65 mL/min/{1.73_m2} (ref >59)
GLUCOSE: 101 mg/dL — AB (ref 65–99)
Globulin, Total: 2.4 g/dL (ref 1.5–4.5)
Potassium: 4.6 mmol/L (ref 3.5–5.2)
Sodium: 143 mmol/L (ref 134–144)
Total Protein: 6.6 g/dL (ref 6.0–8.5)

## 2018-03-05 LAB — TSH: TSH: 4.62 u[IU]/mL — ABNORMAL HIGH (ref 0.450–4.500)

## 2018-03-06 ENCOUNTER — Other Ambulatory Visit: Payer: Self-pay | Admitting: Family Medicine

## 2018-03-07 ENCOUNTER — Telehealth: Payer: Self-pay

## 2018-03-07 NOTE — Telephone Encounter (Signed)
LMTCB

## 2018-03-07 NOTE — Telephone Encounter (Signed)
Pt returned missed call.  Please call pt back to disclose results. ° °Thanks, °[facility] °

## 2018-03-07 NOTE — Telephone Encounter (Signed)
-----   Message from Tamsen Roers, [place] sent at 03/06/2018  6:47 PM EST ----- Labs essentially normal with minor variations. HDL cholesterol slowly improving but total cholesterol and triglycerides back to normal. Continue present medications and recheck theses labs in 6 months. Check protime with INR as planned.

## 2018-03-07 NOTE — Telephone Encounter (Signed)
Patient advised as directed below. 

## 2018-04-01 ENCOUNTER — Ambulatory Visit (INDEPENDENT_AMBULATORY_CARE_PROVIDER_SITE_OTHER): Payer: Medicare Other

## 2018-04-01 DIAGNOSIS — I739 Peripheral vascular disease, unspecified: Secondary | ICD-10-CM | POA: Diagnosis not present

## 2018-04-01 LAB — POCT INR
INR: 3 (ref 2.0–3.0)
PT: 35.4

## 2018-04-01 NOTE — Patient Instructions (Signed)
Description   Continue 7.5mg daily, except 10mg on Monday. Recheck in 4 weeks.      

## 2018-04-01 NOTE — Progress Notes (Signed)
Good INR. Continue Coumadin 7.5 mg everyday except 10 mg on Mondays. Recheck protime/INR in month.

## 2018-04-15 ENCOUNTER — Other Ambulatory Visit: Payer: Self-pay

## 2018-04-21 ENCOUNTER — Ambulatory Visit: Payer: Medicare Other | Admitting: Surgery

## 2018-04-21 ENCOUNTER — Ambulatory Visit (INDEPENDENT_AMBULATORY_CARE_PROVIDER_SITE_OTHER)
Admission: RE | Admit: 2018-04-21 | Discharge: 2018-04-21 | Disposition: A | Discharge status: Home / Self Care | Payer: Medicare Other | Source: Ambulatory Visit | Attending: Family | Admitting: Family

## 2018-04-21 ENCOUNTER — Ambulatory Visit (HOSPITAL_COMMUNITY)
Admission: RE | Admit: 2018-04-21 | Discharge: 2018-04-21 | Disposition: A | Discharge status: Home / Self Care | Payer: Medicare Other | Source: Ambulatory Visit | Attending: Surgery | Admitting: Surgery

## 2018-04-21 ENCOUNTER — Other Ambulatory Visit: Payer: Self-pay | Admitting: Cardiovascular Disease

## 2018-04-21 ENCOUNTER — Ambulatory Visit (INDEPENDENT_AMBULATORY_CARE_PROVIDER_SITE_OTHER)
Admission: RE | Admit: 2018-04-21 | Discharge: 2018-04-21 | Disposition: A | Discharge status: Home / Self Care | Payer: Medicare Other | Source: Ambulatory Visit | Attending: Surgery | Admitting: Surgery

## 2018-04-21 DIAGNOSIS — I739 Peripheral vascular disease, unspecified: Secondary | ICD-10-CM | POA: Diagnosis not present

## 2018-04-21 DIAGNOSIS — I714 Abdominal aortic aneurysm, without rupture, unspecified: Secondary | ICD-10-CM

## 2018-04-21 DIAGNOSIS — I724 Aneurysm of artery of lower extremity: Secondary | ICD-10-CM | POA: Insufficient documentation

## 2018-04-21 DIAGNOSIS — I779 Disorder of arteries and arterioles, unspecified: Secondary | ICD-10-CM | POA: Diagnosis not present

## 2018-04-28 ENCOUNTER — Ambulatory Visit (INDEPENDENT_AMBULATORY_CARE_PROVIDER_SITE_OTHER): Payer: Medicare Other | Admitting: Surgery

## 2018-04-28 ENCOUNTER — Other Ambulatory Visit: Payer: Self-pay

## 2018-04-28 ENCOUNTER — Encounter: Payer: Self-pay | Admitting: Surgery

## 2018-04-28 VITALS — BP 111/68 | HR 62 | Temp 97.1°F | Resp 20 | Ht 74.0 in | Wt 210.9 lb

## 2018-04-28 DIAGNOSIS — I779 Disorder of arteries and arterioles, unspecified: Secondary | ICD-10-CM

## 2018-04-28 DIAGNOSIS — I714 Abdominal aortic aneurysm, without rupture, unspecified: Secondary | ICD-10-CM

## 2018-04-28 DIAGNOSIS — I724 Aneurysm of artery of lower extremity: Secondary | ICD-10-CM

## 2018-04-28 NOTE — Progress Notes (Signed)
[facility] [place]  Patient name: [name] [name] MRN: [id number] DOB: 04/05/1945 Sex: male   REASON FOR VISIT:    Follow up  [name]:    The patient comes in today for followup. He initially underwent endovascular abdominal aortic aneurysm repair in October of 2011. He has a known type II endoleak, however his aneurysm continues to decrease. He is also status post endovascular repair of a left popliteal artery aneurysm on October 2011.  He presented to the office on 10/21/2013 with a six-day history of pain and coldness in his left foot. He was found to have an occluded popliteal stent by angiography the following day. An attempt atthrombolysis was performed, however the patient developed some cardiac issues as well as bleeding, and this had to be discontinued. The patient continued to have an ischemic foot and therefore he was taken to the operating room for revascularization. On 10/29/2013, he underwent femoral to posterior tibial artery bypass graft. He had an excellent caliber vein. The bypass graft occluded early and he was taken back. The bypass wasableto be re-opened. I explored the posterior tibial artery behind the ankle. I passed catheters all the way into the pedal arch and injected TPA. The patient was transitioned to oral anticoagulation and ultimately discharged. While he was in the hospital . He developed bypass graft stenosis and on 05/25/2014 he underwent drug coated balloon angioplasty to the distal portion of his bypass graft.This was repeated in 10-22-2017, with DCB to the bypass   PAST MEDICAL HISTORY:   Past Medical History:  Diagnosis Date  . Abdominal aortic aneurysm (HCC)   . Chronic kidney disease    Right kidney stone  . Coronary artery disease   . Detached retina   . Hyperlipidemia   . Hypertension   . Myocardial infarction [facility](HCC)    2007 heart stent  . Popliteal aneurysm  (HCC)      FAMILY HISTORY:   Family History  Problem Relation Age of Onset  . Aneurysm Sister        possibly   . Peripheral vascular disease Brother     SOCIAL HISTORY:   Social History   Tobacco Use  . Smoking status: Former Smoker    Years: 15.00    Types: Cigarettes    Last attempt to quit: 09/21/1985    Years since quitting: 32.6  . Smokeless tobacco: Current User    Types: Chew  Substance Use Topics  . Alcohol use: Yes    Comment: 1 beer ocassionally (mostly in summer)     ALLERGIES:   Allergies  Allergen Reactions  . Omnipaque [Iohexol] Rash and Other (See Comments)    Pt requires full premeds and needs to bring a driver.  Broke out in rash on back and arms.  Ok w/ premeds  . Oxytetracycline Rash and Other (See Comments)    Terramycin causes rash  . Alteplase Other (See Comments)    Internal bleeding, per RN in short stay. Patient requested this to be added to his allergy list     CURRENT MEDICATIONS:   Current Outpatient Medications  Medication Sig Dispense Refill  . atorvastatin (LIPITOR) 40 MG tablet TAKE 1 TABLET BY MOUTH EVERY DAY 90 tablet 3  . co-enzyme Q-10 30 MG capsule Take 30 mg by mouth daily.    . metoprolol tartrate (LOPRESSOR) 25 MG tablet TAKE 1 TABLET BY MOUTH TWICE A DAY 180 tablet 3  . Multiple Vitamin (MULTIVITAMIN WITH MINERALS)  TABS tablet Take 1 tablet by mouth daily.    . Omega-3 Fatty Acids (FISH OIL TRIPLE STRENGTH) 1400 MG CAPS Take 2,800 mg by mouth 2 (two) times daily.    . Saw Palmetto 450 MG CAPS Take 900 mg by mouth 2 (two) times daily.    Marland Kitchen warfarin (COUMADIN) 5 MG tablet TAKE 1.5 TABLETS BY MOUTH ONCE DAILY EXCEPT TAKE 2 TABLETS ON MONDAY AND FRIDAY (Patient taking differently: Take 7.5-10 mg by mouth See admin instructions. Take 10 mg by mouth on Monday. Take 7.5 mg by mouth daily on all other days.) 169 tablet 2   No current facility-administered medications for this visit.     REVIEW OF SYSTEMS:   [X]  denotes  positive finding, [ ]  denotes negative finding Cardiac  Comments:  Chest pain or chest pressure:    Shortness of breath upon exertion:    Short of breath when lying flat:    Irregular heart rhythm:        Vascular    Pain in calf, thigh, or hip brought on by ambulation:    Pain in feet at night that wakes you up from your sleep:     Blood clot in your veins:    Leg swelling:         Pulmonary    Oxygen at home:    Productive cough:     Wheezing:         Neurologic    Sudden weakness in arms or legs:     Sudden numbness in arms or legs:     Sudden onset of difficulty speaking or slurred speech:    Temporary loss of vision in one eye:     Problems with dizziness:         Gastrointestinal    Blood in stool:     Vomited blood:         Genitourinary    Burning when urinating:     Blood in urine:        Psychiatric    Major depression:         Hematologic    Bleeding problems:    Problems with blood clotting too easily:        Skin    Rashes or ulcers:        Constitutional    Fever or chills:      PHYSICAL EXAM:   Vitals:   04/28/18 1340  BP: 111/68  Pulse: 62  Resp: 20  Temp: (!) 97.1 F (36.2 C)  SpO2: 96%  Weight: 95.7 kg  Height: 6\' 2"  (1.88 m)    GENERAL: The patient is a well-nourished male, in no acute distress. The vital signs are documented above. CARDIAC: There is a regular rate and rhythm.  VASCULAR: non-palpable pedal pulses PULMONARY: Non-labored respirations ABDOMEN: Soft and non-tender with normal pitched bowel sounds.  MUSCULOSKELETAL: There are no major deformities or cyanosis. NEUROLOGIC: No focal weakness or paresthesias are detected. SKIN: There are no ulcers or rashes noted. PSYCHIATRIC: The patient has a normal affect.  STUDIES:   I have ordered and reviewed his u/s with the following findings:  Abdominal Aorta: The largest aortic measurement is 3.8 cm. Patent endovascular aneurysm repair with no evidence of endoleak. The  largest aortic diameter remains essentially unchanged compared to prior exam. Previous diameter measurement was 3.5 cm   +-------+-----------+-----------+------------+------------+ ABI/TBIToday's ABIToday's TBIPrevious ABIPrevious TBI +-------+-----------+-----------+------------+------------+ Right  0.82       0.44  0.94        0.52         +-------+-----------+-----------+------------+------------+ Left   0.68       0.25       0.49        0            +-------+-----------+-----------+------------+------------+  RIght Toe= 60 Left toe = 34  Right: 50-74% stenosis noted in the posterior tibial artery.  Left Graft(s): Femoral-posterior tibial artery bypass graft patent with no evidence of stenosis noted.  MEDICAL ISSUES:   AAA:   Stable size, f/u EVAR duplex 1 year  PAD::  Left BPG patent, f/u duplex in 6 months    Charlena Cross, MD, FACS [facility] [phone] Pager [phone]

## 2018-04-29 ENCOUNTER — Ambulatory Visit (INDEPENDENT_AMBULATORY_CARE_PROVIDER_SITE_OTHER): Payer: Medicare Other

## 2018-04-29 ENCOUNTER — Other Ambulatory Visit: Payer: Self-pay | Admitting: Family Medicine

## 2018-04-29 ENCOUNTER — Other Ambulatory Visit: Payer: Self-pay

## 2018-04-29 DIAGNOSIS — I739 Peripheral vascular disease, unspecified: Secondary | ICD-10-CM | POA: Diagnosis not present

## 2018-04-29 DIAGNOSIS — I779 Disorder of arteries and arterioles, unspecified: Secondary | ICD-10-CM

## 2018-04-29 LAB — POCT INR
INR: 2.5 (ref 2.0–3.0)
PT: 30.5

## 2018-04-29 MED ORDER — WARFARIN SODIUM 5 MG PO TABS: ORAL_TABLET | ORAL | 2 refills | Status: DC

## 2018-04-29 NOTE — Progress Notes (Signed)
Good INR level. Continue Coumadin 5 mg 1.5 tablet daily except 2 tablets on Wednesday.

## 2018-04-29 NOTE — Patient Instructions (Signed)
Description   Continue 7.5mg  daily, except 10mg  on Monday. Recheck in 4 weeks.

## 2018-05-27 ENCOUNTER — Ambulatory Visit (INDEPENDENT_AMBULATORY_CARE_PROVIDER_SITE_OTHER): Payer: Medicare Other

## 2018-05-27 ENCOUNTER — Other Ambulatory Visit: Payer: Self-pay

## 2018-05-27 DIAGNOSIS — I739 Peripheral vascular disease, unspecified: Secondary | ICD-10-CM | POA: Diagnosis not present

## 2018-05-27 DIAGNOSIS — I779 Disorder of arteries and arterioles, unspecified: Secondary | ICD-10-CM

## 2018-05-27 LAB — POCT INR
INR: 2.6 (ref 2.0–3.0)
PT: 31.2

## 2018-05-27 NOTE — Patient Instructions (Signed)
Description   Continue 7.5mg  daily, except 10mg  on Monday. Recheck in 4 weeks.

## 2018-06-24 ENCOUNTER — Ambulatory Visit (INDEPENDENT_AMBULATORY_CARE_PROVIDER_SITE_OTHER): Payer: Medicare Other

## 2018-06-24 ENCOUNTER — Other Ambulatory Visit: Payer: Self-pay

## 2018-06-24 DIAGNOSIS — I779 Disorder of arteries and arterioles, unspecified: Secondary | ICD-10-CM | POA: Diagnosis not present

## 2018-06-24 DIAGNOSIS — I739 Peripheral vascular disease, unspecified: Secondary | ICD-10-CM

## 2018-06-24 LAB — POCT INR
INR: 2.6 (ref 2.0–3.0)
PT: 31.8

## 2018-06-24 NOTE — Patient Instructions (Signed)
Description   Dx: PAD Current coumadin dose: 7.5mg  daily, except 10mg  on Monday PT: 31.8 INR: 2.6 Today's Changes:  NO CHANGE Recheck: 4 weeks

## 2018-07-18 ENCOUNTER — Other Ambulatory Visit: Payer: Self-pay | Admitting: Family Medicine

## 2018-07-22 ENCOUNTER — Ambulatory Visit (INDEPENDENT_AMBULATORY_CARE_PROVIDER_SITE_OTHER): Payer: Medicare Other

## 2018-07-22 ENCOUNTER — Other Ambulatory Visit: Payer: Self-pay

## 2018-07-22 DIAGNOSIS — I739 Peripheral vascular disease, unspecified: Secondary | ICD-10-CM | POA: Diagnosis not present

## 2018-07-22 DIAGNOSIS — I779 Disorder of arteries and arterioles, unspecified: Secondary | ICD-10-CM

## 2018-07-22 LAB — POCT INR
INR: 2.4 (ref 2.0–3.0)
PT: 28.2

## 2018-07-22 NOTE — Patient Instructions (Signed)
Description   Dx: PAD Current coumadin dose: 7.5mg  daily, except 10mg  on Monday PT: 28.2 INR: 2.4 Today's Changes:  NO CHANGE Recheck: 4 weeks

## 2018-08-19 ENCOUNTER — Ambulatory Visit (INDEPENDENT_AMBULATORY_CARE_PROVIDER_SITE_OTHER): Payer: Medicare Other

## 2018-08-19 ENCOUNTER — Other Ambulatory Visit: Payer: Self-pay

## 2018-08-19 DIAGNOSIS — I779 Disorder of arteries and arterioles, unspecified: Secondary | ICD-10-CM | POA: Diagnosis not present

## 2018-08-19 DIAGNOSIS — I739 Peripheral vascular disease, unspecified: Secondary | ICD-10-CM

## 2018-08-19 LAB — POCT INR
INR: 2.3 (ref 2.0–3.0)
PT: 27.1

## 2018-08-19 NOTE — Patient Instructions (Signed)
Description   Increase Coumadin to 7.5 mg daily except Monday and Friday take 10 mg.  F/U 2 weeks

## 2018-09-01 ENCOUNTER — Other Ambulatory Visit: Payer: Self-pay

## 2018-09-01 ENCOUNTER — Ambulatory Visit (INDEPENDENT_AMBULATORY_CARE_PROVIDER_SITE_OTHER): Payer: Medicare Other | Admitting: Family Medicine

## 2018-09-01 DIAGNOSIS — I739 Peripheral vascular disease, unspecified: Secondary | ICD-10-CM | POA: Diagnosis not present

## 2018-09-01 LAB — POCT INR
INR: 2.2 (ref 2.0–3.0)
PT: 26.6

## 2018-09-01 NOTE — Patient Instructions (Signed)
Increase to 7.5mg  daily, except 10mg  M, W, F.  Recheck at follow up visit 09/08/2018

## 2018-09-02 ENCOUNTER — Telehealth: Payer: Self-pay

## 2018-09-02 NOTE — Telephone Encounter (Signed)
Left message for patient to reschedule appointment.

## 2018-09-06 IMAGING — CR DG CHEST 2V
1 series · 2 of 2 positions shown · non-contrast
Comparison: 10/26/2013 and older studies.

CLINICAL DATA: Coughed up blood this morning after clearing his
throat. Former smoker. History of myocardial infarction and cardiac
stents.

EXAM:
CHEST - 2 VIEW

[Series 1: w chest pa · 0.14mm/px · 2 of 2 slices shown]
[im 1/2]
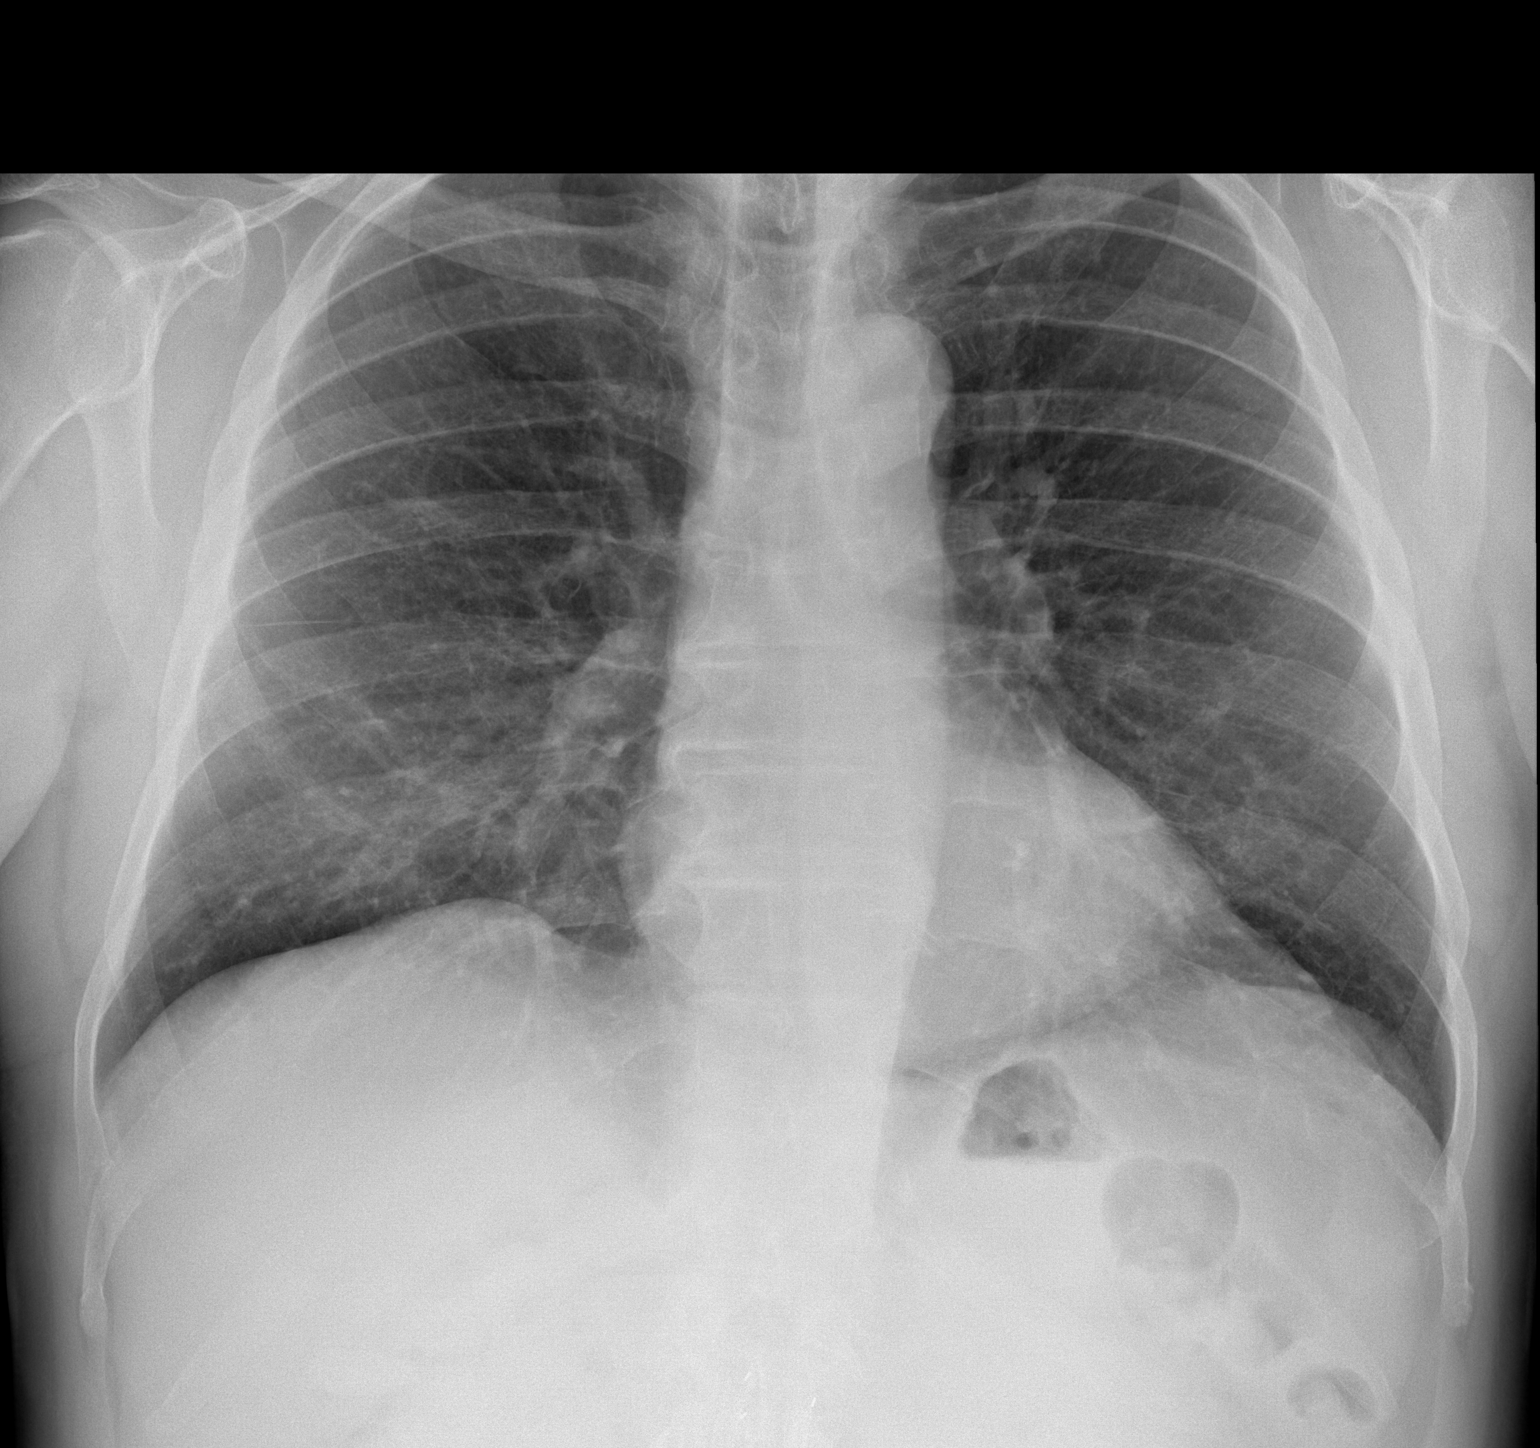
[im 2/2]
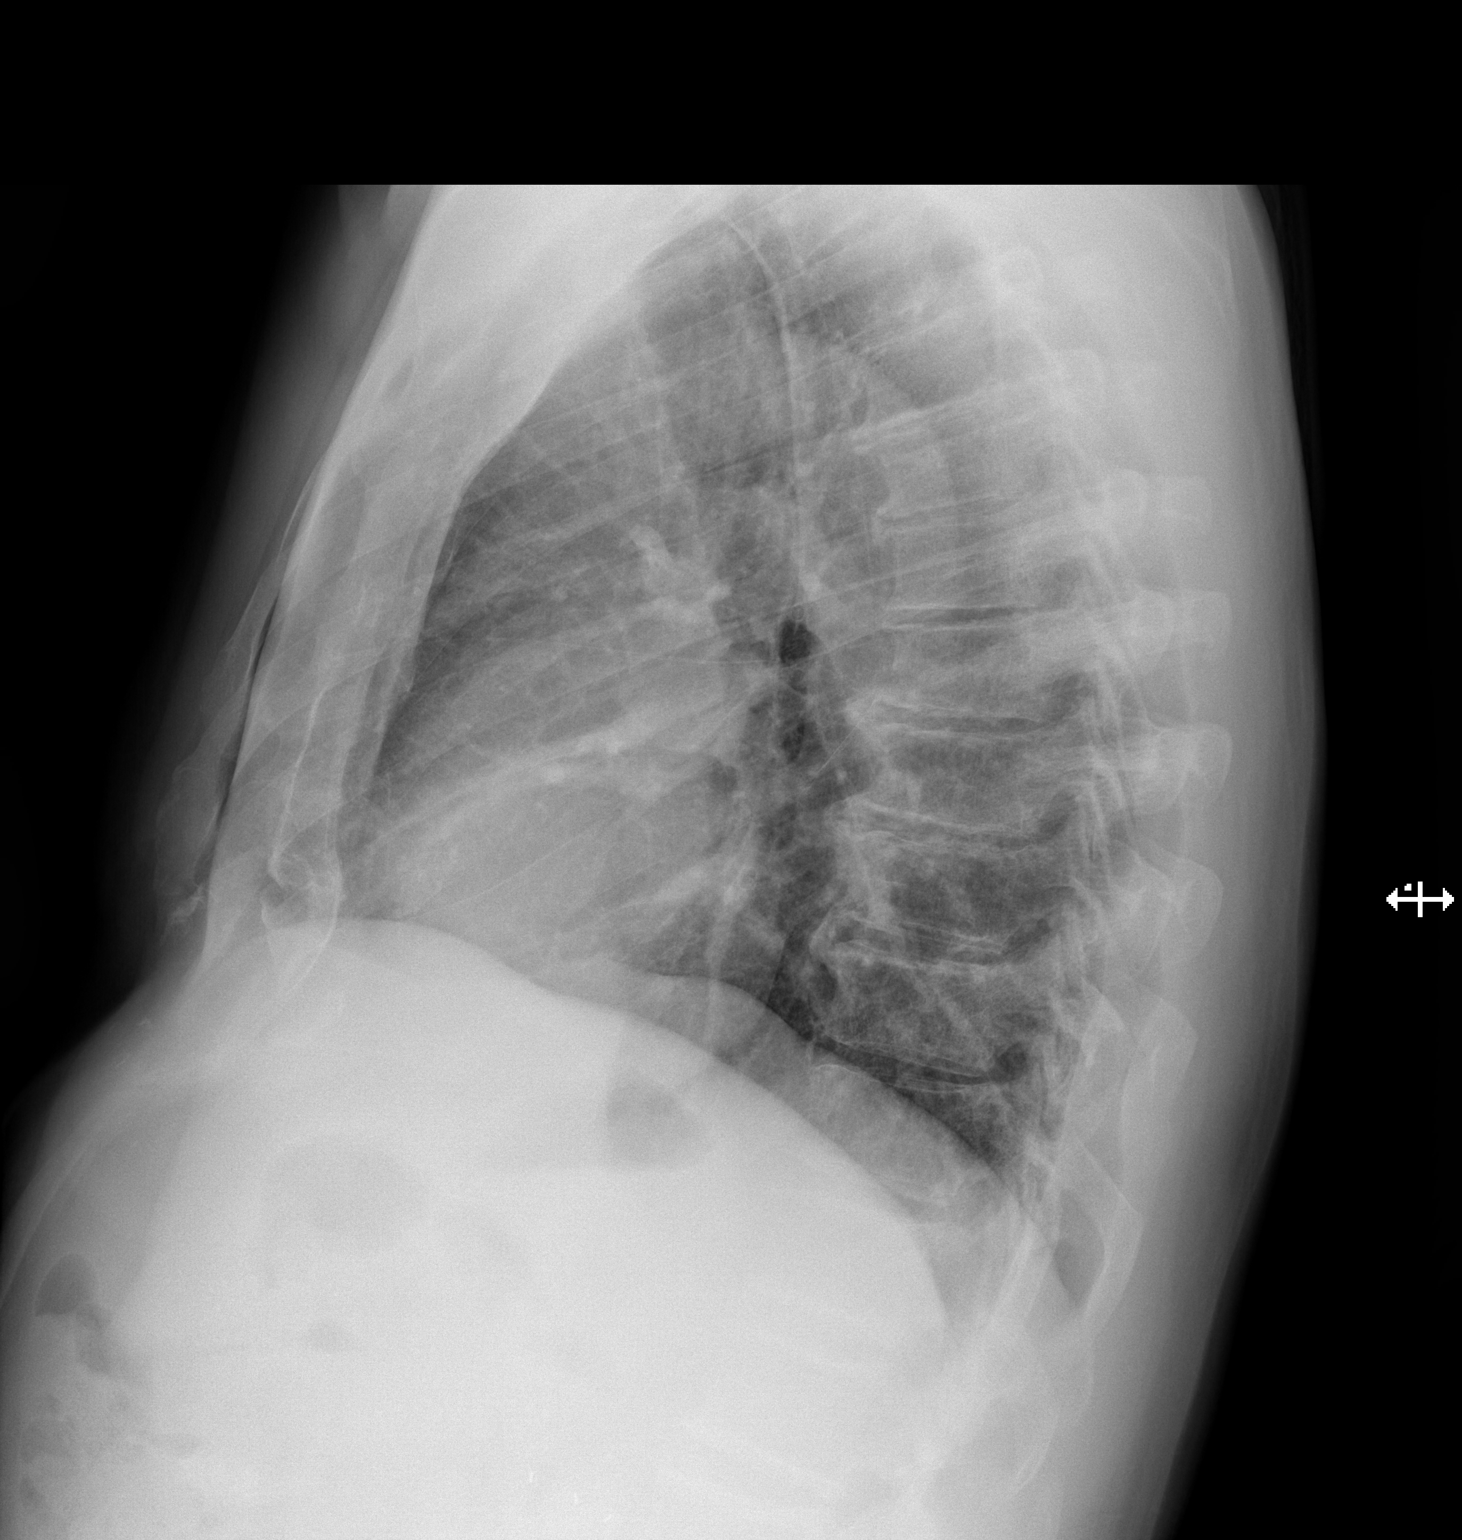

[2 of 2 positions shown; findings below may reference images not displayed]

FINDINGS: Cardiac silhouette is normal in size. Normal mediastinal and hilar
contours.

Clear lungs.  No pleural effusion or pneumothorax.

Skeletal structures are intact.
IMPRESSION: No active cardiopulmonary disease.

## 2018-09-08 ENCOUNTER — Ambulatory Visit: Payer: Self-pay | Admitting: Family Medicine

## 2018-09-08 ENCOUNTER — Telehealth: Payer: Self-pay

## 2018-09-08 DIAGNOSIS — I10 Essential (primary) hypertension: Secondary | ICD-10-CM

## 2018-09-08 DIAGNOSIS — R7989 Other specified abnormal findings of blood chemistry: Secondary | ICD-10-CM | POA: Diagnosis not present

## 2018-09-08 DIAGNOSIS — E785 Hyperlipidemia, unspecified: Secondary | ICD-10-CM | POA: Diagnosis not present

## 2018-09-08 DIAGNOSIS — I739 Peripheral vascular disease, unspecified: Secondary | ICD-10-CM | POA: Diagnosis not present

## 2018-09-09 LAB — CBC WITH DIFFERENTIAL/PLATELET
Basophils Absolute: 0 10*3/uL (ref 0.0–0.2)
Basos: 1 %
EOS (ABSOLUTE): 0.3 10*3/uL (ref 0.0–0.4)
Eos: 5 %
Hematocrit: 47.1 % (ref 37.5–51.0)
Hemoglobin: 16.4 g/dL (ref 13.0–17.7)
Immature Grans (Abs): 0 10*3/uL (ref 0.0–0.1)
Immature Granulocytes: 0 %
Lymphocytes Absolute: 1.6 10*3/uL (ref 0.7–3.1)
Lymphs: 27 %
MCH: 32.1 pg (ref 26.6–33.0)
MCHC: 34.8 g/dL (ref 31.5–35.7)
MCV: 92 fL (ref 79–97)
Monocytes Absolute: 0.5 10*3/uL (ref 0.1–0.9)
Monocytes: 9 %
Neutrophils Absolute: 3.5 10*3/uL (ref 1.4–7.0)
Neutrophils: 58 %
Platelets: 220 10*3/uL (ref 150–450)
RBC: 5.11 x10E6/uL (ref 4.14–5.80)
RDW: 13.1 % (ref 11.6–15.4)
WBC: 5.9 10*3/uL (ref 3.4–10.8)

## 2018-09-09 LAB — COMPREHENSIVE METABOLIC PANEL
ALT: 36 IU/L (ref 0–44)
AST: 34 IU/L (ref 0–40)
Albumin/Globulin Ratio: 2.3 — ABNORMAL HIGH (ref 1.2–2.2)
Albumin: 4.5 g/dL (ref 3.7–4.7)
Alkaline Phosphatase: 68 IU/L (ref 39–117)
BUN/Creatinine Ratio: 15 (ref 10–24)
BUN: 15 mg/dL (ref 8–27)
Bilirubin Total: 1.2 mg/dL (ref 0.0–1.2)
CO2: 21 mmol/L (ref 20–29)
Calcium: 9.2 mg/dL (ref 8.6–10.2)
Chloride: 106 mmol/L (ref 96–106)
Creatinine, Ser: 0.99 mg/dL (ref 0.76–1.27)
GFR calc Af Amer: 86 mL/min/{1.73_m2} (ref >59)
GFR calc non Af Amer: 74 mL/min/{1.73_m2} (ref >59)
Globulin, Total: 2 g/dL (ref 1.5–4.5)
Glucose: 103 mg/dL — ABNORMAL HIGH (ref 65–99)
Potassium: 4.3 mmol/L (ref 3.5–5.2)
Sodium: 142 mmol/L (ref 134–144)
Total Protein: 6.5 g/dL (ref 6.0–8.5)

## 2018-09-09 LAB — LIPID PANEL
Chol/HDL Ratio: 5 ratio (ref 0.0–5.0)
Cholesterol, Total: 145 mg/dL (ref 100–199)
HDL: 29 mg/dL — ABNORMAL LOW (ref >39)
LDL Calculated: 57 mg/dL (ref 0–99)
Triglycerides: 295 mg/dL — ABNORMAL HIGH (ref 0–149)
VLDL Cholesterol Cal: 59 mg/dL — ABNORMAL HIGH (ref 5–40)

## 2018-09-09 LAB — TSH: TSH: 5.51 u[IU]/mL — ABNORMAL HIGH (ref 0.450–4.500)

## 2018-09-12 ENCOUNTER — Telehealth: Payer: Self-pay

## 2018-09-12 NOTE — Telephone Encounter (Signed)
-----   Message from Margo Common, [place] sent at 09/09/2018 12:00 PM EDT ----- All blood tests essentially normal except Triglycerides high and HDL low. Be sure to take Atorvastatin 40 mg qd with Omega-3 Fish Oil and Co-Q 10 daily. Reduce fats in diet and drink plenty of water in diet. Ask lab to run T4 and T3 levels from this blood sample.

## 2018-09-12 NOTE — Telephone Encounter (Signed)
Patient notified of lab results. Additional labs have been added by [facility].

## 2018-09-16 ENCOUNTER — Telehealth: Payer: Self-pay

## 2018-09-16 NOTE — Telephone Encounter (Signed)
-----   Message from Margo Common, [place] sent at 09/15/2018  8:34 AM EDT ----- T3 and T4 levels are normal - indicates TSH elevation is a normal variation in levels. Will monitor levels once a year.

## 2018-09-16 NOTE — Telephone Encounter (Signed)
[id number] 

## 2018-09-16 NOTE — Telephone Encounter (Signed)
Advised 

## 2018-09-29 NOTE — Telephone Encounter (Signed)
Order for lab work.

## 2018-09-30 ENCOUNTER — Ambulatory Visit (INDEPENDENT_AMBULATORY_CARE_PROVIDER_SITE_OTHER): Payer: Medicare Other

## 2018-09-30 ENCOUNTER — Other Ambulatory Visit: Payer: Self-pay

## 2018-09-30 DIAGNOSIS — I739 Peripheral vascular disease, unspecified: Secondary | ICD-10-CM

## 2018-09-30 DIAGNOSIS — I779 Disorder of arteries and arterioles, unspecified: Secondary | ICD-10-CM

## 2018-09-30 LAB — POCT INR
INR: 2.3 (ref 2.0–3.0)
PT: 27.4

## 2018-09-30 NOTE — Patient Instructions (Addendum)
Description   Current dosage:7.5mg  qd,except 10 mg M. W. F. Changes: Per Dr. Caryn Section 7.5 mg M W F, except 10 mg dq Recheck: 2 weeks 10/14/2018

## 2018-10-04 LAB — SPECIMEN STATUS REPORT

## 2018-10-04 LAB — T3: T3, Total: 114 ng/dL (ref 71–180)

## 2018-10-04 LAB — T4: T4, Total: 6.7 ug/dL (ref 4.5–12.0)

## 2018-10-05 NOTE — Telephone Encounter (Signed)
Follow up of cholesterol and triglycerides in 3 months but thyroid tests don't need to be rechecked until next year.

## 2018-10-06 ENCOUNTER — Telehealth: Payer: Self-pay

## 2018-10-06 NOTE — Telephone Encounter (Signed)
Pt advised.   Thanks,   -Kenidi Elenbaas  

## 2018-10-06 NOTE — Telephone Encounter (Signed)
-----   Message from Kizzie Furnish, [place] sent at 10/06/2018 12:03 PM EDT -----  ----- Message ----- From: Margo Common, PA Sent: 10/05/2018   8:02 PM EDT To: Kizzie Furnish, CMA  Follow up of cholesterol and triglycerides in 3 months but thyroid tests don't need to be rechecked until next year.

## 2018-10-06 NOTE — Telephone Encounter (Signed)
[id number] 

## 2018-10-14 ENCOUNTER — Telehealth: Payer: Self-pay | Admitting: *Deleted

## 2018-10-14 ENCOUNTER — Ambulatory Visit (INDEPENDENT_AMBULATORY_CARE_PROVIDER_SITE_OTHER): Payer: Medicare Other | Admitting: *Deleted

## 2018-10-14 ENCOUNTER — Other Ambulatory Visit: Payer: Self-pay

## 2018-10-14 DIAGNOSIS — I739 Peripheral vascular disease, unspecified: Secondary | ICD-10-CM

## 2018-10-14 LAB — POCT INR
INR: 3.2 — AB (ref 2.0–3.0)
PT: 38.2

## 2018-10-14 NOTE — Patient Instructions (Addendum)
Current dosage:7.5 mg M,W,F ,except 10 mg qd. Changes: Per Dr. Rosanna Randy, no changes Recheck: 4 weeks 11/11/2018

## 2018-10-14 NOTE — Telephone Encounter (Addendum)
LMOVM for pt to return call. Patient is to disregard instructions given at coumadin clinic this morning. New instructions are for patient to make no changes, continue 10 mg qd and 7.5 mg M,W.F. Patient's next appt has been changed to 11/11/2018 at 10:10 am.

## 2018-10-16 NOTE — Telephone Encounter (Signed)
LMTCB

## 2018-10-16 NOTE — Telephone Encounter (Signed)
Advised, patient had not made any changes as he was instructed because he knew he was in range.

## 2018-10-28 ENCOUNTER — Ambulatory Visit: Payer: Self-pay

## 2018-10-31 ENCOUNTER — Other Ambulatory Visit: Payer: Self-pay

## 2018-10-31 DIAGNOSIS — I739 Peripheral vascular disease, unspecified: Secondary | ICD-10-CM

## 2018-11-03 ENCOUNTER — Encounter: Payer: Self-pay | Admitting: Surgery

## 2018-11-03 ENCOUNTER — Ambulatory Visit (INDEPENDENT_AMBULATORY_CARE_PROVIDER_SITE_OTHER): Payer: Medicare Other | Admitting: Surgery

## 2018-11-03 ENCOUNTER — Ambulatory Visit (INDEPENDENT_AMBULATORY_CARE_PROVIDER_SITE_OTHER)
Admission: RE | Admit: 2018-11-03 | Discharge: 2018-11-03 | Disposition: A | Discharge status: Home / Self Care | Payer: Medicare Other | Source: Ambulatory Visit | Attending: Surgery | Admitting: Surgery

## 2018-11-03 ENCOUNTER — Other Ambulatory Visit: Payer: Self-pay

## 2018-11-03 ENCOUNTER — Ambulatory Visit (HOSPITAL_COMMUNITY)
Admission: RE | Admit: 2018-11-03 | Discharge: 2018-11-03 | Disposition: A | Discharge status: Home / Self Care | Payer: Medicare Other | Source: Ambulatory Visit | Attending: Family | Admitting: Family

## 2018-11-03 VITALS — BP 118/73 | HR 61 | Temp 97.7°F | Resp 20 | Ht 74.0 in | Wt 208.9 lb

## 2018-11-03 DIAGNOSIS — I714 Abdominal aortic aneurysm, without rupture, unspecified: Secondary | ICD-10-CM

## 2018-11-03 DIAGNOSIS — I724 Aneurysm of artery of lower extremity: Secondary | ICD-10-CM

## 2018-11-03 DIAGNOSIS — I739 Peripheral vascular disease, unspecified: Secondary | ICD-10-CM

## 2018-11-03 DIAGNOSIS — I779 Disorder of arteries and arterioles, unspecified: Secondary | ICD-10-CM

## 2018-11-03 NOTE — Progress Notes (Signed)
[facility] [place]  Patient name: [name] [name] MRN: [id number] DOB: December 22, 1945 Sex: male   REASON FOR VISIT:    Follow up  [name]:   The patient comes in today for followup. He initially underwent endovascular abdominal aortic aneurysm repair in October of 2011. He has a known type II endoleak, however his aneurysm continues to decrease. He is also status post endovascular repair of a left popliteal artery aneurysm on October 2011.  He presented to the office on 10/21/2013 with a six-day history of pain and coldness in his left foot. He was found to have an occluded popliteal stent by angiography the following day. An attempt atthrombolysis was performed, however the patient developed some cardiac issues as well as bleeding, and this had to be discontinued. The patient continued to have an ischemic foot and therefore he was taken to the operating room for revascularization. On 10/29/2013, he underwent femoral to posterior tibial artery bypass graft. He had an excellent caliber vein. The bypass graft occluded early and he was taken back. The bypass wasableto be re-opened. I explored the posterior tibial artery behind the ankle. I passed catheters all the way into the pedal arch and injected TPA. The patient was transitioned to oral anticoagulation and ultimately discharged. While he was in the hospital . He developed bypass graft stenosis and on 05/25/2014 he underwent drug coated balloon angioplasty to the distal portion of his bypass graft.This was repeated in 10-22-2017, with DCB to the bypass  He reports no new complaints.  His leg symptoms have been stable.  He has developed some numbness along his right arm but feels like this may be due to a strained muscle.  He remains on a statin for hypercholesterolemia.  He continues to take Coumadin.   PAST MEDICAL HISTORY:   Past Medical History:  Diagnosis Date   . Abdominal aortic aneurysm (HCC)   . Chronic kidney disease    Right kidney stone  . Coronary artery disease   . Detached retina   . Hyperlipidemia   . Hypertension   . Myocardial infarction [facility])    2007 heart stent  . Popliteal aneurysm (HCC)      FAMILY HISTORY:   Family History  Problem Relation Age of Onset  . Aneurysm Sister        possibly   . Peripheral vascular disease Brother     SOCIAL HISTORY:   Social History   Tobacco Use  . Smoking status: Former Smoker    Years: 15.00    Types: Cigarettes    Quit date: 09/21/1985    Years since quitting: 33.1  . Smokeless tobacco: Current User    Types: Chew  Substance Use Topics  . Alcohol use: Yes    Comment: 1 beer ocassionally (mostly in summer)     ALLERGIES:   Allergies  Allergen Reactions  . Omnipaque [Iohexol] Rash and Other (See Comments)    Pt requires full premeds and needs to bring a driver.  Broke out in rash on back and arms.  Ok w/ premeds  . Oxytetracycline Rash and Other (See Comments)    Terramycin causes rash  . Alteplase Other (See Comments)    Internal bleeding, per RN in short stay. Patient requested this to be added to his allergy list     CURRENT MEDICATIONS:   Current Outpatient Medications  Medication Sig Dispense Refill  . atorvastatin (LIPITOR) 40 MG tablet TAKE 1 TABLET BY MOUTH EVERY  DAY 90 tablet 3  . co-enzyme Q-10 30 MG capsule Take 30 mg by mouth daily.    . metoprolol tartrate (LOPRESSOR) 25 MG tablet TAKE 1 TABLET BY MOUTH TWICE A DAY 180 tablet 3  . Multiple Vitamin (MULTIVITAMIN WITH MINERALS) TABS tablet Take 1 tablet by mouth daily.    . Omega-3 Fatty Acids (FISH OIL TRIPLE STRENGTH) 1400 MG CAPS Take 2,800 mg by mouth 2 (two) times daily.    . Saw Palmetto 450 MG CAPS Take 900 mg by mouth 2 (two) times daily.    Marland Kitchen warfarin (COUMADIN) 5 MG tablet TAKE 1 AND 1/2 TABLETS BY MOUTH ONCE DAILY EXCEPT TAKE 2 TABLETS ON MONDAY 150 tablet 3   No current  facility-administered medications for this visit.     REVIEW OF SYSTEMS:   [X]  denotes positive finding, [ ]  denotes negative finding Cardiac  Comments:  Chest pain or chest pressure:    Shortness of breath upon exertion:    Short of breath when lying flat:    Irregular heart rhythm:        Vascular    Pain in calf, thigh, or hip brought on by ambulation:    Pain in feet at night that wakes you up from your sleep:     Blood clot in your veins:    Leg swelling:         Pulmonary    Oxygen at home:    Productive cough:     Wheezing:         Neurologic    Sudden weakness in arms or legs:     Sudden numbness in arms or legs:     Sudden onset of difficulty speaking or slurred speech:    Temporary loss of vision in one eye:     Problems with dizziness:         Gastrointestinal    Blood in stool:     Vomited blood:         Genitourinary    Burning when urinating:     Blood in urine:        Psychiatric    Major depression:         Hematologic    Bleeding problems:    Problems with blood clotting too easily:        Skin    Rashes or ulcers:        Constitutional    Fever or chills:      PHYSICAL EXAM:   Vitals:   11/03/18 1424  BP: 118/73  Pulse: 61  Resp: 20  Temp: 97.7 F (36.5 C)  SpO2: 98%  Weight: 94.8 kg  Height: 6\' 2"  (1.88 m)    GENERAL: The patient is a well-nourished male, in no acute distress. The vital signs are documented above. CARDIAC: There is a regular rate and rhythm.  VASCULAR: Palpable right radial pulse.  Pedal pulses are nonpalpable PULMONARY: Non-labored respirations ABDOMEN: Soft and non-tender with normal pitched bowel sounds.  MUSCULOSKELETAL: There are no major deformities or cyanosis. NEUROLOGIC: No focal weakness or paresthesias are detected. SKIN: There are no ulcers or rashes noted. PSYCHIATRIC: The patient has a normal affect.  STUDIES:   I have reviewed the following studies:  +-------+-----------+-----------+------------+------------+ ABI/TBIToday's ABIToday's TBIPrevious ABIPrevious TBI +-------+-----------+-----------+------------+------------+ Right  0.86       0.55       0.82        0.44         +-------+-----------+-----------+------------+------------+ Left  0.39       0.2        0.68        0.25         +-------+-----------+-----------+------------+------------+  Right: 50-74% stenosis in the mid/distal PTA with apparent distal occlusion.  Left: Graft patent with no evidence of restenosis.  MEDICAL ISSUES:   His ABIs indicate a decrease on the left however duplex does not show any stenosis within the graft.  I spoke with the tech who did his ultrasound.  This may be technical limitations rather than an actual change.  I plan on repeating the ultrasound in 6 months.  AAA: He will get a repeat duplex in 6 months.    Leia Alf, MD, FACS [facility] [phone] Pager [phone]

## 2018-11-11 ENCOUNTER — Ambulatory Visit (INDEPENDENT_AMBULATORY_CARE_PROVIDER_SITE_OTHER): Payer: Medicare Other

## 2018-11-11 ENCOUNTER — Other Ambulatory Visit: Payer: Self-pay

## 2018-11-11 DIAGNOSIS — I739 Peripheral vascular disease, unspecified: Secondary | ICD-10-CM | POA: Diagnosis not present

## 2018-11-11 LAB — POCT INR
INR: 3.6 — AB (ref 2.0–3.0)
PT: 42.8

## 2018-11-11 NOTE — Patient Instructions (Addendum)
Description   Current dosage:7.5 mg M,W,F,except 10 mg qd. Changes: Per Simona Huh, Hold today (11/11/2018), then 7.5mg  M,W,F & 10 T,TH,Sat,Sun Recheck: 2 weeks 11/25/2018

## 2018-11-25 ENCOUNTER — Ambulatory Visit (INDEPENDENT_AMBULATORY_CARE_PROVIDER_SITE_OTHER): Payer: Medicare Other

## 2018-11-25 ENCOUNTER — Other Ambulatory Visit: Payer: Self-pay

## 2018-11-25 DIAGNOSIS — I779 Disorder of arteries and arterioles, unspecified: Secondary | ICD-10-CM

## 2018-11-25 DIAGNOSIS — I739 Peripheral vascular disease, unspecified: Secondary | ICD-10-CM | POA: Diagnosis not present

## 2018-11-25 LAB — POCT INR
INR: 3.3 — AB (ref 2.0–3.0)
PT: 39.5

## 2018-11-25 NOTE — Patient Instructions (Signed)
Continue 7.5mg  M, W, F and 10mg  the rest of the week.  Recheck in four weeks.

## 2018-12-10 ENCOUNTER — Telehealth: Payer: Self-pay | Admitting: Family Medicine

## 2018-12-10 NOTE — Telephone Encounter (Signed)
pt wants to know what mg Simona Huh wants him to take on the co Q 10.  CB#  [phone]  teri

## 2018-12-11 ENCOUNTER — Other Ambulatory Visit: Payer: Self-pay

## 2018-12-11 MED ORDER — CO Q-10 100 MG PO CAPS: 100.0000 | ORAL_CAPSULE | Freq: Every day | ORAL | 0 refills | Status: DC

## 2018-12-11 NOTE — Telephone Encounter (Signed)
Is it 30 mg?

## 2018-12-11 NOTE — Telephone Encounter (Signed)
100 mg qd

## 2018-12-12 NOTE — Telephone Encounter (Signed)
Patient advised as directed below. 

## 2018-12-23 ENCOUNTER — Other Ambulatory Visit: Payer: Self-pay

## 2018-12-23 ENCOUNTER — Ambulatory Visit (INDEPENDENT_AMBULATORY_CARE_PROVIDER_SITE_OTHER): Payer: Medicare Other

## 2018-12-23 DIAGNOSIS — I739 Peripheral vascular disease, unspecified: Secondary | ICD-10-CM | POA: Diagnosis not present

## 2018-12-23 LAB — POCT INR
INR: 4.3 — AB (ref 2.0–3.0)
Prothrombin Time: 52.2

## 2018-12-23 NOTE — Patient Instructions (Signed)
Description   Current dosage:7.5 mg M,W,F,except 10 mg qd. Changes: hold x2days then resume at 7.5mg  daily except M,W,F 10mg  Recheck: 2 weeks

## 2019-01-06 ENCOUNTER — Ambulatory Visit: Payer: Medicare Other

## 2019-01-07 ENCOUNTER — Other Ambulatory Visit: Payer: Self-pay

## 2019-01-13 ENCOUNTER — Other Ambulatory Visit: Payer: Self-pay

## 2019-01-13 ENCOUNTER — Encounter: Payer: Self-pay | Admitting: Family Medicine

## 2019-01-13 ENCOUNTER — Ambulatory Visit (INDEPENDENT_AMBULATORY_CARE_PROVIDER_SITE_OTHER): Payer: Medicare Other | Admitting: Family Medicine

## 2019-01-13 VITALS — BP 120/70 | HR 64 | Temp 96.6°F | Wt 210.0 lb

## 2019-01-13 DIAGNOSIS — I779 Disorder of arteries and arterioles, unspecified: Secondary | ICD-10-CM | POA: Diagnosis not present

## 2019-01-13 DIAGNOSIS — I739 Peripheral vascular disease, unspecified: Secondary | ICD-10-CM | POA: Diagnosis not present

## 2019-01-13 DIAGNOSIS — I10 Essential (primary) hypertension: Secondary | ICD-10-CM | POA: Diagnosis not present

## 2019-01-13 DIAGNOSIS — E785 Hyperlipidemia, unspecified: Secondary | ICD-10-CM | POA: Diagnosis not present

## 2019-01-13 LAB — POCT INR
INR: 3.6 — AB (ref 2.0–3.0)
PT: 42.6

## 2019-01-13 NOTE — Progress Notes (Signed)
[name] [name]  MRN: [id number] DOB: 28-Feb-1945  Subjective:  HPI   The patient is a 73 year old male who presents for follow up of his lipids.  He has not been seen for an office visit this year.  But he has been keeping appointments for his PT/INR.  His last lipid readings were in July.  He is compliant and tolerant of his cholesterol medications.  Patient Active Problem List   Diagnosis Date Noted  . Pseudoaphakia 08/13/2014  . Posterior vitreous detachment 08/13/2014  . Detached retina 08/13/2014  . Cataract 08/13/2014  . Ischemic foot 11/30/2013  . PAD (peripheral artery disease) ([place]) 10/29/2013  . Bradycardia 10/25/2013  . Lower limb ischemia 10/23/2013  . Popliteal artery occlusion, left ([place]) 10/23/2013  . Redness of skin-Left leg/foot 10/21/2013  . Paresthesia of left foot 10/21/2013  . Sensation of cold in lower extremity-Left foot 10/21/2013  . Pain of left lower leg 10/21/2013  . PVD (peripheral vascular disease) ([place]) 08/03/2013  . AAA (abdominal aortic aneurysm) ([place]) 06/23/2013  . HTN (hypertension) 06/23/2013  . Popliteal aneurysm ([place]) 07/28/2012  . Aneurysm of artery of lower extremity ([place]) 12/03/2011  . Aneurysm of abdominal vessel ([place]) 05/28/2011  . Hyperlipidemia 10/11/2009  . CORONARY ATHEROSCLEROSIS NATIVE CORONARY ARTERY 10/11/2009  . ABDOMINAL BRUIT 10/11/2009  . OTHER DYSPNEA AND RESPIRATORY ABNORMALITIES 10/11/2009    Past Medical History:  Diagnosis Date  . Abdominal aortic aneurysm ([place])   . Chronic kidney disease    Right kidney stone  . Coronary artery disease   . Detached retina   . Hyperlipidemia   . Hypertension   . Myocardial infarction [facility])    2007 heart stent  . Popliteal aneurysm [facility])    Past Surgical History:  Procedure Laterality Date  . ABDOMINAL AORTIC ANEURYSM REPAIR  11/24/09   EVAR  . ABDOMINAL AORTOGRAM W/LOWER EXTREMITY N/A 10/22/2017   Procedure: ABDOMINAL AORTOGRAM W/LOWER EXTREMITY;  Surgeon: Serafina Mitchell, MD;  Location: [place] [facility];  Service: Cardiovascular;  Laterality: N/A;  . AMPUTATION Left 12/10/2013   Procedure: AMPUTATION LEFT  FOURTH AND FIFTH TOES;  Surgeon: Serafina Mitchell, MD;  Location: [place];  Service: Vascular;  Laterality: Left;  . COLONOSCOPY    . coronary stenting     2007  . EYE SURGERY Right    cataract removed  . FEMORAL-POPLITEAL BYPASS GRAFT Left 10/29/2013   Procedure: BYPASS GRAFT FEMORAL-POPLITEAL ARTERY;  Surgeon: Serafina Mitchell, MD;  [facility];  Service: Vascular;  Laterality: Left;  Left Femoral to Posterior Tibial artery bypass graft using nonreversed saphenous vein. and thrombectomy of Tibial arterly.  . FEMORAL-TIBIAL BYPASS GRAFT Left 10/30/2013   Procedure: Reexploration of Femoral-Posterior Tibial Bypass; Thrombectomy of Bypass; Left Leg Angiogram; Posterior Tibial Vein Patch Angioplasty;  Surgeon: Serafina Mitchell, MD;  Location: [place];  Service: Vascular;  Laterality: Left;  . LOWER EXTREMITY ANGIOGRAM N/A 05/25/2014   Procedure: LOWER EXTREMITY ANGIOGRAM;  Surgeon: Serafina Mitchell, MD;  Location: [facility] CATH LAB;  Service: Cardiovascular;  Laterality: N/A;  . PERIPHERAL VASCULAR BALLOON ANGIOPLASTY Left 10/22/2017   Procedure: PERIPHERAL VASCULAR BALLOON ANGIOPLASTY;  Surgeon: Serafina Mitchell, MD;  Location: [place] [facility];  Service: Cardiovascular;  Laterality: Left;  fem pop bypass  . PERIPHERAL VASCULAR CATHETERIZATION Left 10/04/2015   Procedure: Lower Extremity Angiography;  Surgeon: Serafina Mitchell, MD;  Location: [place] [facility];  Service: Cardiovascular;  Laterality: Left;  . PERIPHERAL VASCULAR CATHETERIZATION Left 10/04/2015  Procedure: Peripheral Vascular Intervention;  Surgeon: Nada LibmanVance W Brabham, MD;  [facility];  Service: Cardiovascular;  Laterality: Left;  . POPLITEAL ARTERY STENT  11/2009   left popliteal artery   . RETINAL DETACHMENT SURGERY Right 2011  . TOOTH EXTRACTION  June 2015   Wisdom Tooth  Extraction   Family History  Problem Relation Age of Onset  . Aneurysm Sister        possibly   . Peripheral vascular disease Brother    Social History   Socioeconomic History  . Marital status: Married    Spouse name: Not on file  . Number of children: 1  . Years of education: Not on file  . Highest education level: Associate degree: occupational, Scientist, product/process developmenttechnical, or vocational program  Occupational History  . Occupation: Retired  Engineer, productionocial Needs  . Financial resource strain: Not hard at all  . Food insecurity    Worry: Never true    Inability: Never true  . Transportation needs    Medical: No    Non-medical: No  Tobacco Use  . Smoking status: Former Smoker    Years: 15.00    Types: Cigarettes    Quit date: 09/21/1985    Years since quitting: 33.3  . Smokeless tobacco: Current User    Types: Chew  Substance and Sexual Activity  . Alcohol use: Yes    Comment: 1 beer ocassionally (mostly in summer)  . Drug use: No  . Sexual activity: Not on file  Lifestyle  . Physical activity    Days per week: 0 days    Minutes per session: 0 min  . Stress: Not at all  Relationships  . Social Musicianconnections    Talks on phone: Patient refused    Gets together: Patient refused    Attends religious service: Patient refused    Active member of club or organization: Patient refused    Attends meetings of clubs or organizations: Patient refused    Relationship status: Patient refused  . Intimate partner violence    Fear of current or ex partner: Patient refused    Emotionally abused: Patient refused    Physically abused: Patient refused    Forced sexual activity: Patient refused  Other Topics Concern  . Not on file  Social History Narrative   Lives with family.   Outpatient Encounter Medications as of 01/13/2019  Medication Sig  . atorvastatin (LIPITOR) 40 MG tablet TAKE 1 TABLET BY MOUTH EVERY DAY  . Coenzyme Q10 (CO Q-10) 100 MG CAPS Take 100 capsules by mouth daily.  . metoprolol  tartrate (LOPRESSOR) 25 MG tablet TAKE 1 TABLET BY MOUTH TWICE A DAY  . Multiple Vitamin (MULTIVITAMIN WITH MINERALS) TABS tablet Take 1 tablet by mouth daily.  . Omega-3 Fatty Acids (FISH OIL TRIPLE STRENGTH) 1400 MG CAPS Take 2,800 mg by mouth 2 (two) times daily.  . Saw Palmetto 450 MG CAPS Take 900 mg by mouth 2 (two) times daily.  Marland Kitchen. warfarin (COUMADIN) 5 MG tablet TAKE 1 AND 1/2 TABLETS BY MOUTH ONCE DAILY EXCEPT TAKE 2 TABLETS ON MONDAY   No facility-administered encounter medications on file as of 01/13/2019.    Allergies  Allergen Reactions  . Omnipaque [Iohexol] Rash and Other (See Comments)    Pt requires full premeds and needs to bring a driver.  Broke out in rash on back and arms.  Ok w/ premeds  . Oxytetracycline Rash and Other (See Comments)    Terramycin causes rash  . Alteplase Other (  See Comments)    Internal bleeding, per RN in short stay. Patient requested this to be added to his allergy list   Review of Systems  Constitutional: Negative for chills, diaphoresis, fever and malaise/fatigue.  HENT: Negative for congestion, ear pain, sinus pain and sore throat.   Respiratory: Negative for cough and shortness of breath.   Cardiovascular: Negative for chest pain, palpitations and leg swelling.  Gastrointestinal: Negative for abdominal pain and diarrhea.  Musculoskeletal: Negative for myalgias.  Neurological: Negative for headaches.    Objective:  BP 120/70 (BP Location: Right Arm, Patient Position: Sitting, Cuff Size: Normal)   Pulse 64   Temp (!) 96.6 F (35.9 C) (Skin)   Wt 210 lb (95.3 kg)   SpO2 99%   BMI 26.96 kg/m   Physical Exam  Constitutional: He is oriented to person, place, and time and well-developed, well-nourished, and in no distress.  HENT:  Head: Normocephalic.  Eyes: Conjunctivae are normal.  Neck: Neck supple.  Cardiovascular: Normal rate and regular rhythm.  Pulmonary/Chest: Effort normal and breath sounds normal.  Abdominal: Soft. Bowel  sounds are normal.  Musculoskeletal: Normal range of motion.     Comments: Well healed amputation of the left 4th and 5th toes (2015 due to infection from arterial occlusive disease).   Neurological: He is alert and oriented to person, place, and time.  Skin: No rash noted.  Psychiatric: Mood, affect and judgment normal.    Assessment and Plan :  1. Hyperlipidemia, unspecified hyperlipidemia type Trying to follow low fat diet and continues Atorvastatin 40 mg qd with Omega-3 1400 mg BID and Co-Q 10 100 mg qd. Recheck CBC, CMP and Lipid Panel. Follow up pending reports. - CBC with Differential - Comprehensive Metabolic Panel (CMET) - Lipid Profile  2. Essential hypertension Well controlled BP on the Metoprolol Tartrate 25 mg BID. Recheck routine labs and follow up pending reports. - CBC with Differential - Comprehensive Metabolic Panel (CMET) - Lipid Profile  3. PAD (peripheral artery disease) (HCC) Protime INR 3.6 today (better than the 4.3 in November). Recommend holding Coumadin for a day and getting more leafy green vegetables in diet then resume Coumadin 7,5 mg qd except 10 mg MWF. No cyanosis or cold extremities. Continue follow up with Dr. Myra Gianotti (vascular surgeon) as planned to follow up endovascular abdominal aortic aneurysm repair done October 2011 and left popliteal artery aneurysm with bypass graft stenosis requiring angioplasty 2016 and 2019.. - CBC with Differential - Comprehensive Metabolic Panel (CMET) - Lipid Profile

## 2019-01-14 LAB — COMPREHENSIVE METABOLIC PANEL
ALT: 23 IU/L (ref 0–44)
AST: 24 IU/L (ref 0–40)
Albumin/Globulin Ratio: 1.9 (ref 1.2–2.2)
Albumin: 4.4 g/dL (ref 3.7–4.7)
Alkaline Phosphatase: 85 IU/L (ref 39–117)
BUN/Creatinine Ratio: 10 (ref 10–24)
BUN: 11 mg/dL (ref 8–27)
Bilirubin Total: 2 mg/dL — ABNORMAL HIGH (ref 0.0–1.2)
CO2: 22 mmol/L (ref 20–29)
Calcium: 9.3 mg/dL (ref 8.6–10.2)
Chloride: 109 mmol/L — ABNORMAL HIGH (ref 96–106)
Creatinine, Ser: 1.12 mg/dL (ref 0.76–1.27)
GFR calc Af Amer: 74 mL/min/{1.73_m2} (ref >59)
GFR calc non Af Amer: 64 mL/min/{1.73_m2} (ref >59)
Globulin, Total: 2.3 g/dL (ref 1.5–4.5)
Glucose: 102 mg/dL — ABNORMAL HIGH (ref 65–99)
Potassium: 4.5 mmol/L (ref 3.5–5.2)
Sodium: 144 mmol/L (ref 134–144)
Total Protein: 6.7 g/dL (ref 6.0–8.5)

## 2019-01-14 LAB — CBC WITH DIFFERENTIAL/PLATELET
Basophils Absolute: 0 10*3/uL (ref 0.0–0.2)
Basos: 0 %
EOS (ABSOLUTE): 0.3 10*3/uL (ref 0.0–0.4)
Eos: 4 %
Hematocrit: 48.2 % (ref 37.5–51.0)
Hemoglobin: 16.3 g/dL (ref 13.0–17.7)
Immature Grans (Abs): 0 10*3/uL (ref 0.0–0.1)
Immature Granulocytes: 0 %
Lymphocytes Absolute: 1.5 10*3/uL (ref 0.7–3.1)
Lymphs: 20 %
MCH: 31.9 pg (ref 26.6–33.0)
MCHC: 33.8 g/dL (ref 31.5–35.7)
MCV: 94 fL (ref 79–97)
Monocytes Absolute: 0.8 10*3/uL (ref 0.1–0.9)
Monocytes: 11 %
Neutrophils Absolute: 4.8 10*3/uL (ref 1.4–7.0)
Neutrophils: 65 %
Platelets: 224 10*3/uL (ref 150–450)
RBC: 5.11 x10E6/uL (ref 4.14–5.80)
RDW: 13.1 % (ref 11.6–15.4)
WBC: 7.5 10*3/uL (ref 3.4–10.8)

## 2019-01-14 LAB — LIPID PANEL
Chol/HDL Ratio: 4.5 ratio (ref 0.0–5.0)
Cholesterol, Total: 131 mg/dL (ref 100–199)
HDL: 29 mg/dL — ABNORMAL LOW (ref >39)
LDL Chol Calc (NIH): 59 mg/dL (ref 0–99)
Triglycerides: 270 mg/dL — ABNORMAL HIGH (ref 0–149)
VLDL Cholesterol Cal: 43 mg/dL — ABNORMAL HIGH (ref 5–40)

## 2019-01-29 NOTE — Progress Notes (Addendum)
Subjective:   [name] [name] is a 73 y.o. male who presents for Medicare Annual/Subsequent preventive examination.    This visit is being conducted through telemedicine due to the COVID-19 pandemic. This patient has given me verbal consent via doximity to conduct this visit, patient states they are participating from their home address. Some vital signs may be absent or patient reported.    Patient identification: identified by name, DOB, and current address  Review of Systems:  N/A  Cardiac Risk Factors include: advanced age (>855men, 76>65 women);hypertension;male gender;dyslipidemia     Objective:    Vitals: There were no vitals taken for this visit.  There is no height or weight on file to calculate BMI. Unable to obtain vitals due to visit being conducted via telephonically.   Advanced Directives 02/02/2019 11/03/2018 04/28/2018 01/28/2018 10/22/2017 12/28/2016 12/28/2016  Does Patient Have a Medical Advance Directive? No No No No No No No  Does patient want to make changes to medical advance directive? - - - - - - -  Would patient like information on creating a medical advance directive? No - Patient declined No - Patient declined No - Patient declined No - Patient declined No - Patient declined Yes (ED - Information included in AVS) -    Tobacco Social History   Tobacco Use  Smoking Status Former Smoker   Years: 15.00   Types: Cigarettes   Quit date: 09/21/1985   Years since quitting: 33.3  Smokeless Tobacco Current User   Types: Chew     Ready to quit: Not Answered Counseling given: Not Answered   Clinical Intake:  Pre-visit preparation completed: Yes  Pain : No/denies pain Pain Score: 0-No pain     Nutritional Risks: None Diabetes: No  How often do you need to have someone help you when you read instructions, pamphlets, or other written materials from your doctor or pharmacy?: 1 - Never  Interpreter Needed?: No  Information entered by :: [facility]geons[facility]markoski,  LPN  Past Medical History:  Diagnosis Date   Abdominal aortic aneurysm (HCC)    Chronic kidney disease    Right kidney stone   Coronary artery disease    Detached retina    Hyperlipidemia    Hypertension    Myocardial infarction [facility](HCC)    2007 heart stent   Popliteal aneurysm [facility](HCC)    Past Surgical History:  Procedure Laterality Date   ABDOMINAL AORTIC ANEURYSM REPAIR  11/24/09   EVAR   ABDOMINAL AORTOGRAM W/LOWER EXTREMITY N/A 10/22/2017   Procedure: ABDOMINAL AORTOGRAM W/LOWER EXTREMITY;  Surgeon: Nada LibmanBrabham, Vance W, MD;  [facility];  Service: Cardiovascular;  Laterality: N/A;   AMPUTATION Left 12/10/2013   Procedure: AMPUTATION LEFT  FOURTH AND FIFTH TOES;  Surgeon: Nada LibmanVance W Brabham, MD;  [facility];  Service: Vascular;  Laterality: Left;   COLONOSCOPY     coronary stenting     2007   EYE SURGERY Right    cataract removed   FEMORAL-POPLITEAL BYPASS GRAFT Left 10/29/2013   Procedure: BYPASS GRAFT FEMORAL-POPLITEAL ARTERY;  Surgeon: Nada LibmanVance W Brabham, MD;  [facility];  Service: Vascular;  Laterality: Left;  Left Femoral to Posterior Tibial artery bypass graft using nonreversed saphenous vein. and thrombectomy of Tibial arterly.   FEMORAL-TIBIAL BYPASS GRAFT Left 10/30/2013   Procedure: Reexploration of Femoral-Posterior Tibial Bypass; Thrombectomy of Bypass; Left Leg Angiogram; Posterior Tibial Vein Patch Angioplasty;  Surgeon: Nada LibmanVance W Brabham, MD;  [facility];  Service: Vascular;  Laterality: Left;  LOWER EXTREMITY ANGIOGRAM N/A 05/25/2014   Procedure: LOWER EXTREMITY ANGIOGRAM;  Surgeon: Nada Libman, MD;  Location: [facility] CATH LAB;  Service: Cardiovascular;  Laterality: N/A;   PERIPHERAL VASCULAR BALLOON ANGIOPLASTY Left 10/22/2017   Procedure: PERIPHERAL VASCULAR BALLOON ANGIOPLASTY;  Surgeon: Nada Libman, MD;  [facility];  Service: Cardiovascular;  Laterality: Left;  fem pop bypass   PERIPHERAL VASCULAR CATHETERIZATION Left  10/04/2015   Procedure: Lower Extremity Angiography;  Surgeon: Nada Libman, MD;  Location: [facility] INVASIVE CV LAB;  Service: Cardiovascular;  Laterality: Left;   PERIPHERAL VASCULAR CATHETERIZATION Left 10/04/2015   Procedure: Peripheral Vascular Intervention;  Surgeon: Nada Libman, MD;  [facility];  Service: Cardiovascular;  Laterality: Left;   POPLITEAL ARTERY STENT  11/2009   left popliteal artery    RETINAL DETACHMENT SURGERY Right 2011   TOOTH EXTRACTION  June 2015   Wisdom Tooth Extraction   Family History  Problem Relation Age of Onset   Aneurysm Sister        possibly    Peripheral vascular disease Brother    Social History   Socioeconomic History   Marital status: Married    Spouse name: Not on file   Number of children: 1   Years of education: Not on file   Highest education level: Associate degree: occupational, Scientist, product/process development, or vocational program  Occupational History   Occupation: Retired  Tobacco Use   Smoking status: Former Smoker    Years: 15.00    Types: Cigarettes    Quit date: 09/21/1985    Years since quitting: 33.3   Smokeless tobacco: Current User    Types: Chew  Substance and Sexual Activity   Alcohol use: Yes    Comment: 1 beer ocassionally (mostly in summer)   Drug use: No   Sexual activity: Not on file  Other Topics Concern   Not on file  Social History Narrative   Lives with family.   Social Determinants of Health   Financial Resource Strain: Low Risk    Difficulty of Paying Living Expenses: Not hard at all  Food Insecurity: No Food Insecurity   Worried About Programme researcher, broadcasting/film/video in the Last Year: Never true   Ran Out of Food in the Last Year: Never true  Transportation Needs: No Transportation Needs   Lack of Transportation (Medical): No   Lack of Transportation (Non-Medical): No  Physical Activity: Sufficiently Active   Days of Exercise per Week: 6 days   Minutes of Exercise per Session: 30 min  Stress: No Stress  Concern Present   Feeling of Stress : Not at all  Social Connections: Somewhat Isolated   Frequency of Communication with Friends and Family: Once a week   Frequency of Social Gatherings with Friends and Family: More than three times a week   Attends Religious Services: Never   Database administrator or Organizations: No   Attends Banker Meetings: Never   Marital Status: Married    Outpatient Encounter Medications as of 02/02/2019  Medication Sig   atorvastatin (LIPITOR) 40 MG tablet TAKE 1 TABLET BY MOUTH EVERY DAY   Coenzyme Q10 (CO Q-10) 100 MG CAPS Take 100 capsules by mouth daily.   metoprolol tartrate (LOPRESSOR) 25 MG tablet TAKE 1 TABLET BY MOUTH TWICE A DAY   Multiple Vitamin (MULTIVITAMIN WITH MINERALS) TABS tablet Take 1 tablet by mouth daily.   Omega-3 Fatty Acids (FISH OIL TRIPLE STRENGTH) 1400 MG CAPS Take 2,800  mg by mouth 2 (two) times daily.   Saw Palmetto 450 MG CAPS Take 900 mg by mouth 2 (two) times daily.   warfarin (COUMADIN) 5 MG tablet TAKE 1 AND 1/2 TABLETS BY MOUTH ONCE DAILY EXCEPT TAKE 2 TABLETS ON MONDAY (Patient taking differently: TAKE 1 AND 1/2 TABLETS BY MOUTH ONCE DAILY EXCEPT TAKE 2 TABLETS ON MONDAY, WEDNESDAY AN FRIDAY)   No facility-administered encounter medications on file as of 02/02/2019.    Activities of Daily Living In your present state of health, do you have any difficulty performing the following activities: 02/02/2019  Hearing? N  Vision? N  Difficulty concentrating or making decisions? N  Walking or climbing stairs? N  Dressing or bathing? N  Doing errands, shopping? N  Preparing Food and eating ? N  Using the Toilet? N  In the past six months, have you accidently leaked urine? N  Do you have problems with loss of bowel control? N  Managing your Medications? N  Managing your Finances? N  Housekeeping or managing your Housekeeping? N  Some recent data might be hidden    Patient Care Team: [name], Vickki Muff, PA  as PCP - General (Family Medicine) Sherren Mocha, MD as PCP - Cardiology (Cardiology) Serafina Mitchell, MD as Consulting Physician (Vascular Surgery)   Assessment:   This is a routine wellness examination for [facility].  Exercise Activities and Dietary recommendations Current Exercise Habits: Home exercise routine, Type of exercise: walking, Time (Minutes): 30, Frequency (Times/Week): 6, Weekly Exercise (Minutes/Week): 180, Intensity: Mild, Exercise limited by: None identified  Goals      DIET - INCREASE WATER INTAKE     Recommend increasing water intake to 4-6 glasses a day.        Fall Risk: Fall Risk  02/02/2019 01/28/2018 12/28/2016 11/25/2015  Falls in the past year? 0 0 No No  Number falls in past yr: 0 0 - -  Injury with Fall? 0 0 - -    FALL RISK PREVENTION PERTAINING TO THE HOME:  Any stairs in or around the home? Yes  If so, are there any without handrails? No   Home free of loose throw rugs in walkways, pet beds, electrical cords, etc? Yes  Adequate lighting in your home to reduce risk of falls? Yes   ASSISTIVE DEVICES UTILIZED TO PREVENT FALLS:  Life alert? No  Use of a cane, walker or w/c? No  Grab bars in the bathroom? Yes  Shower chair or bench in shower? No  Elevated toilet seat or a handicapped toilet? No   TIMED UP AND GO:  Was the test performed? No .    Depression Screen PHQ 2/9 Scores 02/02/2019 02/02/2019 01/28/2018 12/28/2016  PHQ - 2 Score 0 0 0 0    Cognitive Function     6CIT Screen 02/02/2019 01/28/2018  What Year? 0 points 0 points  What month? 0 points 0 points  What time? 0 points 0 points  Count back from 20 0 points 0 points  Months in reverse 0 points 0 points  Repeat phrase 4 points 2 points  Total Score 4 2    Immunization History  Administered Date(s) Administered   Influenza, High Dose Seasonal PF 01/14/2015, 11/25/2015, 11/30/2016, 12/06/2017   Pneumococcal Conjugate-13 01/25/2017   Pneumococcal Polysaccharide-23  01/28/2018   Tdap 12/17/2013    Qualifies for Shingles Vaccine? Yes . Due for Shingrix. Pt has been advised to call insurance company to determine out of pocket expense. Advised may also receive  vaccine at local pharmacy or Health Dept. Verbalized acceptance and understanding.  Tdap: Up to date  Flu Vaccine: Due for Flu vaccine. Does the patient want to receive this vaccine today?  No . Pt would like to receive this vaccine at tomorrows apt.  Pneumococcal Vaccine: Completed series  Screening Tests Health Maintenance  Topic Date Due   HEMOGLOBIN A1C  04/24/2014   INFLUENZA VACCINE  09/13/2018   Fecal DNA (Cologuard)  02/22/2020   TETANUS/TDAP  12/18/2023   Hepatitis C Screening  Completed   PNA vac Low Risk Adult  Completed   Cancer Screenings:  Colorectal Screening: Cologuard completed 02/21/17. Repeat every 3 years.  Lung Cancer Screening: (Low Dose CT Chest recommended if Age 42-80 years, 30 pack-year currently smoking OR have quit w/in 15years.) does not qualify.   Additional Screening:  Hepatitis C Screening: Up to date  Vision Screening: Recommended annual ophthalmology exams for early detection of glaucoma and other disorders of the eye.  Dental Screening: Recommended annual dental exams for proper oral hygiene  Community Resource Referral:  CRR required this visit?  No        Plan:  I have personally reviewed and addressed the Medicare Annual Wellness questionnaire and have noted the following in the patient's chart:  A. Medical and social history B. Use of alcohol, tobacco or illicit drugs  C. Current medications and supplements D. Functional ability and status E.  Nutritional status F.  Physical activity G. Advance directives H. List of other physicians I.  Hospitalizations, surgeries, and ER visits in previous 12 months J.  Vitals K. Screenings such as hearing and vision if needed, cognitive and depression L. Referrals and appointments   In addition, I  have reviewed and discussed with patient certain preventive protocols, quality metrics, and best practice recommendations. A written personalized care plan for preventive services as well as general preventive health recommendations were provided to patient.   Darrick Huntsman, [place]  09/81/1914 Nurse Health Advisor   Nurse Notes: Pt needs a flu shot and Hgb A1c check at next in office apt.   Reviewed note and plan of Nurse Health Advisor. Agree with documentation and recommendations.

## 2019-01-30 ENCOUNTER — Encounter: Payer: Medicare Other | Admitting: Family Medicine

## 2019-01-30 ENCOUNTER — Ambulatory Visit: Payer: Medicare Other

## 2019-02-02 ENCOUNTER — Ambulatory Visit (INDEPENDENT_AMBULATORY_CARE_PROVIDER_SITE_OTHER): Payer: Medicare Other

## 2019-02-02 ENCOUNTER — Other Ambulatory Visit: Payer: Self-pay

## 2019-02-02 DIAGNOSIS — Z0000 Encounter for general adult medical examination without abnormal findings: Secondary | ICD-10-CM | POA: Diagnosis not present

## 2019-02-02 NOTE — Patient Instructions (Signed)
Mr. [name] [name] , Thank you for taking time to come for your Medicare Wellness Visit. I appreciate your ongoing commitment to your health goals. Please review the following plan we discussed and let me know if I can assist you in the future.   Screening recommendations/referrals: Colonoscopy: Cologuard up to date, due 02/22/20 Recommended yearly ophthalmology/optometry visit for glaucoma screening and checkup Recommended yearly dental visit for hygiene and checkup  Vaccinations: Influenza vaccine: Currently due, pt to receive the vaccine at tomorrow apt if able to.  Pneumococcal vaccine: Completed series Tdap vaccine: Up to date, due 12/2023 Shingles vaccine: Pt declines today.     Advanced directives: Advance directive discussed with you today. Even though you declined this today please call our office should you change your mind and we can give you the proper paperwork for you to fill out.  Conditions/risks identified: Recommend increasing water intake to 6-8 8 oz glasses a day.  Next appointment: 02/03/19 @ 10:10 AM for a PT/INR check. Pt declined scheduling a follow up with PCP or an AWV for 2021 at this time.   Preventive Care 20 Years and Older, Male Preventive care refers to lifestyle choices and visits with your health care provider that can promote health and wellness. What does preventive care include?  A yearly physical exam. This is also called an annual well check.  Dental exams once or twice a year.  Routine eye exams. Ask your health care provider how often you should have your eyes checked.  Personal lifestyle choices, including:  Daily care of your teeth and gums.  Regular physical activity.  Eating a healthy diet.  Avoiding tobacco and drug use.  Limiting alcohol use.  Practicing safe sex.  Taking low doses of aspirin every day.  Taking vitamin and mineral supplements as recommended by your health care provider. What happens during an annual well  check? The services and screenings done by your health care provider during your annual well check will depend on your age, overall health, lifestyle risk factors, and family history of disease. Counseling  Your health care provider may ask you questions about your:  Alcohol use.  Tobacco use.  Drug use.  Emotional well-being.  Home and relationship well-being.  Sexual activity.  Eating habits.  History of falls.  Memory and ability to understand (cognition).  Work and work Statistician. Screening  You may have the following tests or measurements:  Height, weight, and BMI.  Blood pressure.  Lipid and cholesterol levels. These may be checked every 5 years, or more frequently if you are over 73 years old.  Skin check.  Lung cancer screening. You may have this screening every year starting at age 93 if you have a 30-pack-year history of smoking and currently smoke or have quit within the past 15 years.  Fecal occult blood test (FOBT) of the stool. You may have this test every year starting at age 77.  Flexible sigmoidoscopy or colonoscopy. You may have a sigmoidoscopy every 5 years or a colonoscopy every 10 years starting at age 49.  Prostate cancer screening. Recommendations will vary depending on your family history and other risks.  Hepatitis C blood test.  Hepatitis B blood test.  Sexually transmitted disease (STD) testing.  Diabetes screening. This is done by checking your blood sugar (glucose) after you have not eaten for a while (fasting). You may have this done every 1-3 years.  Abdominal aortic aneurysm (AAA) screening. You may need this if you are a current or former  smoker.  Osteoporosis. You may be screened starting at age 36 if you are at high risk. Talk with your health care provider about your test results, treatment options, and if necessary, the need for more tests. Vaccines  Your health care provider may recommend certain vaccines, such  as:  Influenza vaccine. This is recommended every year.  Tetanus, diphtheria, and acellular pertussis (Tdap, Td) vaccine. You may need a Td booster every 10 years.  Zoster vaccine. You may need this after age 52.  Pneumococcal 13-valent conjugate (PCV13) vaccine. One dose is recommended after age 53.  Pneumococcal polysaccharide (PPSV23) vaccine. One dose is recommended after age 73. Talk to your health care provider about which screenings and vaccines you need and how often you need them. This information is not intended to replace advice given to you by your health care provider. Make sure you discuss any questions you have with your health care provider. Document Released: 02/25/2015 Document Revised: 10/19/2015 Document Reviewed: 11/30/2014 Elsevier Interactive Patient Education  2017 [place] Prevention in the Home Falls can cause injuries. They can happen to people of all ages. There are many things you can do to make your home safe and to help prevent falls. What can I do on the outside of my home?  Regularly fix the edges of walkways and driveways and fix any cracks.  Remove anything that might make you trip as you walk through a door, such as a raised step or threshold.  Trim any bushes or trees on the path to your home.  Use bright outdoor lighting.  Clear any walking paths of anything that might make someone trip, such as rocks or tools.  Regularly check to see if handrails are loose or broken. Make sure that both sides of any steps have handrails.  Any raised decks and porches should have guardrails on the edges.  Have any leaves, snow, or ice cleared regularly.  Use sand or salt on walking paths during winter.  Clean up any spills in your garage right away. This includes oil or grease spills. What can I do in the bathroom?  Use night lights.  Install grab bars by the toilet and in the tub and shower. Do not use towel bars as grab bars.  Use  non-skid mats or decals in the tub or shower.  If you need to sit down in the shower, use a plastic, non-slip stool.  Keep the floor dry. Clean up any water that spills on the floor as soon as it happens.  Remove soap buildup in the tub or shower regularly.  Attach bath mats securely with double-sided non-slip rug tape.  Do not have throw rugs and other things on the floor that can make you trip. What can I do in the bedroom?  Use night lights.  Make sure that you have a light by your bed that is easy to reach.  Do not use any sheets or blankets that are too big for your bed. They should not hang down onto the floor.  Have a firm chair that has side arms. You can use this for support while you get dressed.  Do not have throw rugs and other things on the floor that can make you trip. What can I do in the kitchen?  Clean up any spills right away.  Avoid walking on wet floors.  Keep items that you use a lot in easy-to-reach places.  If you need to reach something above you, use a  strong step stool that has a grab bar.  Keep electrical cords out of the way.  Do not use floor polish or wax that makes floors slippery. If you must use wax, use non-skid floor wax.  Do not have throw rugs and other things on the floor that can make you trip. What can I do with my stairs?  Do not leave any items on the stairs.  Make sure that there are handrails on both sides of the stairs and use them. Fix handrails that are broken or loose. Make sure that handrails are as long as the stairways.  Check any carpeting to make sure that it is firmly attached to the stairs. Fix any carpet that is loose or worn.  Avoid having throw rugs at the top or bottom of the stairs. If you do have throw rugs, attach them to the floor with carpet tape.  Make sure that you have a light switch at the top of the stairs and the bottom of the stairs. If you do not have them, ask someone to add them for you. What  else can I do to help prevent falls?  Wear shoes that:  Do not have high heels.  Have rubber bottoms.  Are comfortable and fit you well.  Are closed at the toe. Do not wear sandals.  If you use a stepladder:  Make sure that it is fully opened. Do not climb a closed stepladder.  Make sure that both sides of the stepladder are locked into place.  Ask someone to hold it for you, if possible.  Clearly mark and make sure that you can see:  Any grab bars or handrails.  First and last steps.  Where the edge of each step is.  Use tools that help you move around (mobility aids) if they are needed. These include:  Canes.  Walkers.  Scooters.  Crutches.  Turn on the lights when you go into a dark area. Replace any light bulbs as soon as they burn out.  Set up your furniture so you have a clear path. Avoid moving your furniture around.  If any of your floors are uneven, fix them.  If there are any pets around you, be aware of where they are.  Review your medicines with your doctor. Some medicines can make you feel dizzy. This can increase your chance of falling. Ask your doctor what other things that you can do to help prevent falls. This information is not intended to replace advice given to you by your health care provider. Make sure you discuss any questions you have with your health care provider. Document Released: 11/25/2008 Document Revised: 07/07/2015 Document Reviewed: 03/05/2014 Elsevier Interactive Patient Education  2017 [employer].

## 2019-02-03 ENCOUNTER — Other Ambulatory Visit: Payer: Self-pay

## 2019-02-03 ENCOUNTER — Ambulatory Visit (INDEPENDENT_AMBULATORY_CARE_PROVIDER_SITE_OTHER): Payer: Medicare Other

## 2019-02-03 DIAGNOSIS — I779 Disorder of arteries and arterioles, unspecified: Secondary | ICD-10-CM

## 2019-02-03 DIAGNOSIS — I739 Peripheral vascular disease, unspecified: Secondary | ICD-10-CM | POA: Diagnosis not present

## 2019-02-03 DIAGNOSIS — Z23 Encounter for immunization: Secondary | ICD-10-CM

## 2019-02-03 LAB — POCT INR
INR: 3 (ref 2.0–3.0)
PT: 35.8

## 2019-02-03 NOTE — Patient Instructions (Signed)
Description    Dx: PAD Current Coumdin Dose: 10mg  on M, W, F, then 7.5mg  all other days PT: 42.6 INR: 3.6 Today's changes: Hold for 1 day, then resume 10mg  M, W, F and 7.5mg  all other days Recheck: 4 weeks

## 2019-02-11 ENCOUNTER — Telehealth: Payer: Self-pay | Admitting: Cardiovascular Disease

## 2019-02-11 NOTE — Telephone Encounter (Signed)
The patient accidentally took Lipitor 40 mg 2 tablets on Monday instead of 1. He has not taken any of the medication since.  Informed him he did not take a dangerous dose of Lipitor and he may resume since he has taken a break.  He was grateful for call back.

## 2019-02-11 NOTE — Telephone Encounter (Signed)
Pt c/o medication issue:  1. Name of Medication: atorvastatin (LIPITOR) 40 MG tablet  2. How are you currently taking this medication (dosage and times per day)? 40 MG  3. Are you having a reaction (difficulty breathing--STAT)? no  4. What is your medication issue? Pt took two doses of the medicine on Monday night. He has not taken any medication since taking two doses. He wanted to know when he should re-start taking the medicine

## 2019-02-23 ENCOUNTER — Ambulatory Visit: Payer: Medicare Other | Admitting: Cardiovascular Disease

## 2019-03-03 ENCOUNTER — Other Ambulatory Visit: Payer: Self-pay | Admitting: Family Medicine

## 2019-03-03 ENCOUNTER — Ambulatory Visit (INDEPENDENT_AMBULATORY_CARE_PROVIDER_SITE_OTHER): Payer: Medicare Other

## 2019-03-03 ENCOUNTER — Other Ambulatory Visit: Payer: Self-pay

## 2019-03-03 DIAGNOSIS — I739 Peripheral vascular disease, unspecified: Secondary | ICD-10-CM

## 2019-03-03 LAB — POCT INR
INR: 3.1 — AB (ref 2.0–3.0)
PT: 36.7

## 2019-03-03 NOTE — Telephone Encounter (Signed)
Requested Prescriptions  Pending Prescriptions Disp Refills  . metoprolol tartrate (LOPRESSOR) 25 MG tablet [Pharmacy Med Name: METOPROLOL TARTRATE 25 MG TAB] 180 tablet 3    Sig: TAKE 1 TABLET BY MOUTH TWICE A DAY     Cardiovascular:  Beta Blockers Passed - 03/03/2019 12:59 AM      Passed - Last BP in normal range    BP Readings from Last 1 Encounters:  01/13/19 120/70         Passed - Last Heart Rate in normal range    Pulse Readings from Last 1 Encounters:  01/13/19 64         Passed - Valid encounter within last 6 months    Recent Outpatient Visits          1 month ago Hyperlipidemia, unspecified hyperlipidemia type   [facility] [name], [place], [place]   12 months ago PAD (peripheral artery disease) (HCC)   [facility] [name], [place], [place]   1 year ago PAD (peripheral artery disease) [facility])   [facility] [name], [place], [place]   1 year ago Hemoptysis   [facility] [place], [place], [place]   2 years ago 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

## 2019-03-11 ENCOUNTER — Ambulatory Visit (INDEPENDENT_AMBULATORY_CARE_PROVIDER_SITE_OTHER): Payer: Medicare Other | Admitting: Cardiovascular Disease

## 2019-03-11 ENCOUNTER — Other Ambulatory Visit: Payer: Self-pay

## 2019-03-11 ENCOUNTER — Encounter: Payer: Self-pay | Admitting: Cardiovascular Disease

## 2019-03-11 VITALS — BP 102/64 | HR 56 | Ht 74.0 in | Wt 207.0 lb

## 2019-03-11 DIAGNOSIS — I2510 Atherosclerotic heart disease of native coronary artery without angina pectoris: Secondary | ICD-10-CM

## 2019-03-11 DIAGNOSIS — E782 Mixed hyperlipidemia: Secondary | ICD-10-CM | POA: Diagnosis not present

## 2019-03-11 DIAGNOSIS — I739 Peripheral vascular disease, unspecified: Secondary | ICD-10-CM

## 2019-03-11 DIAGNOSIS — I10 Essential (primary) hypertension: Secondary | ICD-10-CM | POA: Diagnosis not present

## 2019-03-11 NOTE — Progress Notes (Signed)
Cardiology Office Note:    Date:  03/11/2019   ID:  [name] [name], DOB 27-Sep-1945, MRN [id number]  PCP:  Tamsen Roers, PA  Cardiologist:  Tonny Bollman, MD  Electrophysiologist:  None   Referring MD: Tamsen Roers, PA   Chief Complaint  Patient presents with  . Coronary Artery Disease    History of Present Illness:    DOCTOR SHEAHAN is a 74 y.o. male with a hx of CAD status post acute inferior wall MI in 2009 treated with bare-metal stent to the RCA.  He also had vascular surgery for treatment of an abdominal aortic aneurysm and left popliteal aneurysm. He underwent drug-coated balloon angioplasty of his left leg bypass conduit in 2019.   The patient is here alone today.  He has not had any recent cardiac symptoms.  He specifically denies chest pain, shortness of breath, edema, or heart palpitations.  He does have some mild symptoms in the left leg that limit his walking a little.  However, he does not have any major limitation.  He follows regularly with Dr. Myra Gianotti.  He has done well on warfarin with a well-controlled INR and no bleeding problems reported.  Past Medical History:  Diagnosis Date  . Abdominal aortic aneurysm (HCC)   . Chronic kidney disease    Right kidney stone  . Coronary artery disease   . Detached retina   . Hyperlipidemia   . Hypertension   . Myocardial infarction [facility])    2007 heart stent  . Popliteal aneurysm [facility])     Past Surgical History:  Procedure Laterality Date  . ABDOMINAL AORTIC ANEURYSM REPAIR  11/24/09   EVAR  . ABDOMINAL AORTOGRAM W/LOWER EXTREMITY N/A 10/22/2017   Procedure: ABDOMINAL AORTOGRAM W/LOWER EXTREMITY;  Surgeon: Nada Libman, MD;  Location: [facility] INVASIVE CV LAB;  Service: Cardiovascular;  Laterality: N/A;  . AMPUTATION Left 12/10/2013   Procedure: AMPUTATION LEFT  FOURTH AND FIFTH TOES;  Surgeon: Nada Libman, MD;  Location: [facility] OR;  Service: Vascular;  Laterality: Left;  . COLONOSCOPY    . coronary  stenting     2007  . EYE SURGERY Right    cataract removed  . FEMORAL-POPLITEAL BYPASS GRAFT Left 10/29/2013   Procedure: BYPASS GRAFT FEMORAL-POPLITEAL ARTERY;  Surgeon: Nada Libman, MD;  Location: [facility] OR;  Service: Vascular;  Laterality: Left;  Left Femoral to Posterior Tibial artery bypass graft using nonreversed saphenous vein. and thrombectomy of Tibial arterly.  . FEMORAL-TIBIAL BYPASS GRAFT Left 10/30/2013   Procedure: Reexploration of Femoral-Posterior Tibial Bypass; Thrombectomy of Bypass; Left Leg Angiogram; Posterior Tibial Vein Patch Angioplasty;  Surgeon: Nada Libman, MD;  Location: [facility] OR;  Service: Vascular;  Laterality: Left;  . LOWER EXTREMITY ANGIOGRAM N/A 05/25/2014   Procedure: LOWER EXTREMITY ANGIOGRAM;  Surgeon: Nada Libman, MD;  Location: [facility] CATH LAB;  Service: Cardiovascular;  Laterality: N/A;  . PERIPHERAL VASCULAR BALLOON ANGIOPLASTY Left 10/22/2017   Procedure: PERIPHERAL VASCULAR BALLOON ANGIOPLASTY;  Surgeon: Nada Libman, MD;  Location: [facility] INVASIVE CV LAB;  Service: Cardiovascular;  Laterality: Left;  fem pop bypass  . PERIPHERAL VASCULAR CATHETERIZATION Left 10/04/2015   Procedure: Lower Extremity Angiography;  Surgeon: Nada Libman, MD;  Location: [facility] INVASIVE CV LAB;  Service: Cardiovascular;  Laterality: Left;  . PERIPHERAL VASCULAR CATHETERIZATION Left 10/04/2015   Procedure: Peripheral Vascular Intervention;  Surgeon: Nada Libman, MD;  Location: [facility] INVASIVE CV LAB;  Service: Cardiovascular;  Laterality: Left;  . POPLITEAL ARTERY STENT  11/2009   left popliteal artery   . RETINAL DETACHMENT SURGERY Right 2011  . TOOTH EXTRACTION  June 2015   [place]dom Tooth Extraction    Current Medications: Current Meds  Medication Sig  . atorvastatin (LIPITOR) 40 MG tablet TAKE 1 TABLET BY MOUTH EVERY DAY  . Coenzyme Q10 (CO Q-10) 100 MG CAPS Take 100 capsules by mouth daily.  . metoprolol tartrate (LOPRESSOR) 25 MG tablet TAKE 1 TABLET BY MOUTH TWICE A DAY    . Multiple Vitamin (MULTIVITAMIN WITH MINERALS) TABS tablet Take 1 tablet by mouth daily.  . Omega-3 Fatty Acids (FISH OIL TRIPLE STRENGTH) 1400 MG CAPS Take 2,800 mg by mouth 2 (two) times daily.  . Saw Palmetto 450 MG CAPS Take 900 mg by mouth 2 (two) times daily.  Marland Kitchen warfarin (COUMADIN) 5 MG tablet Take 5 mg by mouth daily. TAKE 1 AND 1/2 TABLETS BY MOUTH ONCE DAILY EXCEPT TAKE 2 TABLETS ON MONDAY, WEDNESDAY AN FRIDAY     Allergies:   Omnipaque [iohexol], Oxytetracycline, and Alteplase   Social History   Socioeconomic History  . Marital status: Married    Spouse name: Not on file  . Number of children: 1  . Years of education: Not on file  . Highest education level: Associate degree: occupational, Scientist, product/process development, or vocational program  Occupational History  . Occupation: Retired  Tobacco Use  . Smoking status: Former Smoker    Years: 15.00    Types: Cigarettes    Quit date: 09/21/1985    Years since quitting: 33.4  . Smokeless tobacco: Current User    Types: Chew  Substance and Sexual Activity  . Alcohol use: Yes    Comment: 1 beer ocassionally (mostly in summer)  . Drug use: No  . Sexual activity: Not on file  Other Topics Concern  . Not on file  Social History Narrative   Lives with family.   Social Determinants of Health   Financial Resource Strain: Low Risk   . Difficulty of Paying Living Expenses: Not hard at all  Food Insecurity: No Food Insecurity  . Worried About Programme researcher, broadcasting/film/video in the Last Year: Never true  . Ran Out of Food in the Last Year: Never true  Transportation Needs: No Transportation Needs  . Lack of Transportation (Medical): No  . Lack of Transportation (Non-Medical): No  Physical Activity: Sufficiently Active  . Days of Exercise per Week: 6 days  . Minutes of Exercise per Session: 30 min  Stress: No Stress Concern Present  . Feeling of Stress : Not at all  Social Connections: Somewhat Isolated  . Frequency of Communication with Friends and  Family: Once a week  . Frequency of Social Gatherings with Friends and Family: More than three times a week  . Attends Religious Services: Never  . Active Member of Clubs or Organizations: No  . Attends Banker Meetings: Never  . Marital Status: Married     Family History: The patient's family history includes Aneurysm in his sister; Peripheral vascular disease in his brother.  ROS:   Please see the history of present illness.    All other systems reviewed and are negative.  EKGs/Labs/Other Studies Reviewed:    EKG:  EKG is ordered today.  The ekg ordered today demonstrates sinus bradycardia 56 bpm, age-indeterminate inferior infarct. No significant change from previous.   Recent Labs: 09/08/2018: TSH 5.510 01/13/2019: ALT 23; BUN 11; Creatinine, Ser 1.12; Hemoglobin 16.3; Platelets 224; Potassium 4.5; Sodium 144  Recent  Lipid Panel    Component Value Date/Time   CHOL 131 01/13/2019 0958   TRIG 270 (H) 01/13/2019 0958   HDL 29 (L) 01/13/2019 0958   CHOLHDL 4.5 01/13/2019 0958   CHOLHDL 4.0 01/25/2017 1129   LDLCALC 59 01/13/2019 0958   LDLCALC 73 01/25/2017 1129    Physical Exam:    VS:  BP 102/64   Pulse (!) 56   Ht 6\' 2"  (1.88 m)   Wt 207 lb (93.9 kg)   SpO2 96%   BMI 26.58 kg/m     Wt Readings from Last 3 Encounters:  03/11/19 207 lb (93.9 kg)  01/13/19 210 lb (95.3 kg)  11/03/18 208 lb 14.4 oz (94.8 kg)     GEN:  Well nourished, well developed in no acute distress HEENT: Normal NECK: No JVD; No carotid bruits LYMPHATICS: No lymphadenopathy CARDIAC: RRR, no murmurs, rubs, gallops RESPIRATORY:  Clear to auscultation without rales, wheezing or rhonchi  ABDOMEN: Soft, non-tender, non-distended MUSCULOSKELETAL:  No edema; No deformity  SKIN: Warm and dry NEUROLOGIC:  Alert and oriented x 3 PSYCHIATRIC:  Normal affect   ASSESSMENT:    1. Coronary artery disease involving native coronary artery of native heart without angina pectoris   2. PAD  (peripheral artery disease) ([place])   3. Essential hypertension   4. Mixed hyperlipidemia    PLAN:    In order of problems listed above:  1. The patient is stable without symptoms of angina.  He is not on antiplatelet therapy because of chronic oral anticoagulation.  He remains on atorvastatin for lipid-lowering as well as a beta-blocker.  No changes are made in his medical regimen today. 2. Followed by vascular surgery.  Plans noted for repeat Doppler studies and ABIs at 42-month intervals. 3. Blood pressure is well controlled on metoprolol.  Most recent labs reviewed. 4. Lipids are at goal with LDL cholesterol less than 70 mg/dL.  Triglycerides are elevated.  He reports this is a chronic problem and he has had some trouble obtaining fish oil in the proper doses.  He was unable to afford Lovaza.  He will continue on his current program.  We discussed lifestyle modification   Medication Adjustments/Labs and Tests Ordered: Current medicines are reviewed at length with the patient today.  Concerns regarding medicines are outlined above.  Orders Placed This Encounter  Procedures  . EKG 12-Lead   No orders of the defined types were placed in this encounter.   Patient Instructions  Medication Instructions:  Your provider recommends that you continue on your current medications as directed. Please refer to the Current Medication list given to you today.   *If you need a refill on your cardiac medications before your next appointment, please call your pharmacy*   Follow-Up: At [facility], you and your health needs are our priority.  As part of our continuing mission to provide you with exceptional heart care, we have created designated Provider Care Teams.  These Care Teams include your primary Cardiologist (physician) and Advanced Practice Providers (APPs -  Physician Assistants and Nurse Practitioners) who all work together to provide you with the care you need, when you need it. Your next  appointment:   12 month(s) The format for your next appointment:   In Person Provider:   You may see Sherren Mocha, MD or one of the following Advanced Practice Providers on your designated Care Team:    Richardson Dopp, PA-C  [place]in [place], [place]  Daune Perch, [place]  Signed, Tonny Bollman, MD  03/11/2019 11:05 AM    [facility]

## 2019-03-11 NOTE — Patient Instructions (Signed)

## 2019-03-13 ENCOUNTER — Ambulatory Visit: Payer: Self-pay | Admitting: *Deleted

## 2019-03-13 NOTE — Telephone Encounter (Signed)
Patient's wife is calling- he had tooth extracted yesterday and the bleeding has not stopped. Patient is on blood thinner. Patient advised per protocol- go to ED- option is to reach out to dentist first.  Reason for Disposition . [1] Bleeding present > 30 minutes AND [2] using correct technique of direct pressure  Answer Assessment - Initial Assessment Questions 1. SYMPTOM: "What's the main symptom you're concerned about?" (e.g., bleeding, pain, fever)     Bleeding after tooth extraction 2. ONSET: "When did the bleeding  start?"     Tooth pulled yesterday- 10:30 3. DATE of TOOTH EXTRACTION: "When was surgery performed?"      1/28 4. BLEEDING: "Is there any bleeding?" If so, ask: "How much?"     Yes- sometimes quite a bit- will not clot 5. PAIN: "Is there any pain?" If so, ask: "How bad is it?"  (Scale 1-10; or mild, moderate, severe)     No- not bad 6. FEVER: "Do you have a fever?" If so, ask: "What is your temperature, how was it measured, and when did it start?"     no 7. OTHER SYMPTOMS: "Do you have any other symptoms?" (e.g., face swelling)     No swelling/bruising  Protocols used: TOOTH EXTRACTION-A-AH

## 2019-03-28 DIAGNOSIS — Z23 Encounter for immunization: Secondary | ICD-10-CM | POA: Diagnosis not present

## 2019-03-31 ENCOUNTER — Other Ambulatory Visit: Payer: Self-pay

## 2019-03-31 ENCOUNTER — Ambulatory Visit (INDEPENDENT_AMBULATORY_CARE_PROVIDER_SITE_OTHER): Payer: Medicare Other

## 2019-03-31 DIAGNOSIS — I739 Peripheral vascular disease, unspecified: Secondary | ICD-10-CM | POA: Diagnosis not present

## 2019-03-31 DIAGNOSIS — I2510 Atherosclerotic heart disease of native coronary artery without angina pectoris: Secondary | ICD-10-CM | POA: Diagnosis not present

## 2019-03-31 LAB — POCT INR
INR: 3.1 — AB (ref 2.0–3.0)
PT: 37.1

## 2019-03-31 NOTE — Patient Instructions (Signed)
Description    Dx: PAD Current Coumdin Dose: 10mg  on M, W, F, then 7.5mg  all other days PT: 37.1 INR: 3.1 Today's changes:No changes Recheck: 4 weeks

## 2019-04-17 ENCOUNTER — Other Ambulatory Visit: Payer: Self-pay | Admitting: Cardiovascular Disease

## 2019-04-25 DIAGNOSIS — Z23 Encounter for immunization: Secondary | ICD-10-CM | POA: Diagnosis not present

## 2019-04-28 ENCOUNTER — Other Ambulatory Visit: Payer: Self-pay

## 2019-04-28 ENCOUNTER — Ambulatory Visit (INDEPENDENT_AMBULATORY_CARE_PROVIDER_SITE_OTHER): Payer: Medicare Other

## 2019-04-28 DIAGNOSIS — I739 Peripheral vascular disease, unspecified: Secondary | ICD-10-CM

## 2019-04-28 DIAGNOSIS — I2510 Atherosclerotic heart disease of native coronary artery without angina pectoris: Secondary | ICD-10-CM

## 2019-04-28 LAB — POCT INR
INR: 3 (ref 2.0–3.0)
PT: 36.2

## 2019-04-28 NOTE — Progress Notes (Signed)
Good INR today. Continue Coumadin 10 mg MWF and 7.5 mg all other days. Recheck protime/INR in a month.

## 2019-04-28 NOTE — Patient Instructions (Signed)
Description    Dx: PAD Current Coumdin Dose: 10mg  on M, W, F, then 7.5mg  all other days PT: 36.2 INR: 3.0 Today's changes:No changes Recheck: 4 weeks

## 2019-05-14 ENCOUNTER — Telehealth (HOSPITAL_COMMUNITY): Payer: Self-pay

## 2019-05-14 ENCOUNTER — Other Ambulatory Visit: Payer: Self-pay | Admitting: *Deleted

## 2019-05-14 DIAGNOSIS — I779 Disorder of arteries and arterioles, unspecified: Secondary | ICD-10-CM

## 2019-05-14 DIAGNOSIS — I714 Abdominal aortic aneurysm, without rupture, unspecified: Secondary | ICD-10-CM

## 2019-05-14 DIAGNOSIS — I739 Peripheral vascular disease, unspecified: Secondary | ICD-10-CM

## 2019-05-14 NOTE — Telephone Encounter (Signed)

## 2019-05-18 ENCOUNTER — Encounter: Payer: Self-pay | Admitting: Surgery

## 2019-05-18 ENCOUNTER — Ambulatory Visit (INDEPENDENT_AMBULATORY_CARE_PROVIDER_SITE_OTHER): Payer: Medicare Other | Admitting: Surgery

## 2019-05-18 ENCOUNTER — Other Ambulatory Visit: Payer: Self-pay

## 2019-05-18 ENCOUNTER — Ambulatory Visit (INDEPENDENT_AMBULATORY_CARE_PROVIDER_SITE_OTHER)
Admission: RE | Admit: 2019-05-18 | Discharge: 2019-05-18 | Disposition: A | Discharge status: Home / Self Care | Payer: Medicare Other | Source: Ambulatory Visit | Attending: Surgery | Admitting: Surgery

## 2019-05-18 ENCOUNTER — Ambulatory Visit (HOSPITAL_COMMUNITY)
Admission: RE | Admit: 2019-05-18 | Discharge: 2019-05-18 | Disposition: A | Discharge status: Home / Self Care | Payer: Medicare Other | Source: Ambulatory Visit | Attending: Surgery | Admitting: Surgery

## 2019-05-18 VITALS — BP 109/67 | HR 58 | Temp 99.0°F | Resp 18 | Ht 74.0 in | Wt 207.0 lb

## 2019-05-18 DIAGNOSIS — I779 Disorder of arteries and arterioles, unspecified: Secondary | ICD-10-CM

## 2019-05-18 DIAGNOSIS — I714 Abdominal aortic aneurysm, without rupture, unspecified: Secondary | ICD-10-CM

## 2019-05-18 DIAGNOSIS — I724 Aneurysm of artery of lower extremity: Secondary | ICD-10-CM | POA: Diagnosis not present

## 2019-05-18 DIAGNOSIS — I739 Peripheral vascular disease, unspecified: Secondary | ICD-10-CM | POA: Diagnosis not present

## 2019-05-18 NOTE — Progress Notes (Signed)
[facility] [place]  Patient name: [name] [name] MRN: [id number] DOB: Apr 29, 1945 Sex: male   REASON FOR VISIT:    Follow up  [name]:    The patient comes in today for followup. He initially underwent endovascular abdominal aortic aneurysm repair in October of 2011. He has a known type II endoleak, however his aneurysm continues to decrease. He is also status post endovascular repair of a left popliteal artery aneurysm on October 2011.  He presented to the office on 10/21/2013 with a six-day history of pain and coldness in his left foot. He was found to have an occluded popliteal stent by angiography the following day. An attempt atthrombolysis was performed, however the patient developed some cardiac issues as well as bleeding, and this had to be discontinued. The patient continued to have an ischemic foot and therefore he was taken to the operating room for revascularization. On 10/29/2013, he underwent femoral to posterior tibial artery bypass graft. He had an excellent caliber vein. The bypass graft occluded early and he was taken back. The bypass wasableto be re-opened. I explored the posterior tibial artery behind the ankle. I passed catheters all the way into the pedal arch and injected TPA. The patient was transitioned to oral anticoagulation and ultimately discharged. While he was in the hospital . He developed bypass graft stenosis and on 05/25/2014 he underwent drug coated balloon angioplasty to the distal portion of his bypass graft.This was repeated in 10-22-2017, with DCB to the bypass  He has not had any significant interval change since I last saw him. He remains on a statin for hypercholesterolemia.  He continues to take Coumadin.   PAST MEDICAL HISTORY:   Past Medical History:  Diagnosis Date  . Abdominal aortic aneurysm (HCC)   . Chronic kidney disease    Right kidney stone  .  Coronary artery disease   . Detached retina   . Hyperlipidemia   . Hypertension   . Myocardial infarction [facility])    2007 heart stent  . Popliteal aneurysm (HCC)      FAMILY HISTORY:   Family History  Problem Relation Age of Onset  . Aneurysm Sister        possibly   . Peripheral vascular disease Brother     SOCIAL HISTORY:   Social History   Tobacco Use  . Smoking status: Former Smoker    Years: 15.00    Types: Cigarettes    Quit date: 09/21/1985    Years since quitting: 33.6  . Smokeless tobacco: Current User    Types: Chew  Substance Use Topics  . Alcohol use: Yes    Comment: 1 beer ocassionally (mostly in summer)     ALLERGIES:   Allergies  Allergen Reactions  . Omnipaque [Iohexol] Rash and Other (See Comments)    Pt requires full premeds and needs to bring a driver.  Broke out in rash on back and arms.  Ok w/ premeds  . Oxytetracycline Rash and Other (See Comments)    Terramycin causes rash  . Alteplase Other (See Comments)    Internal bleeding, per RN in short stay. Patient requested this to be added to his allergy list     CURRENT MEDICATIONS:   Current Outpatient Medications  Medication Sig Dispense Refill  . atorvastatin (LIPITOR) 40 MG tablet TAKE 1 TABLET BY MOUTH EVERY DAY 90 tablet 2  . Coenzyme Q10 (CO Q-10) 100 MG CAPS Take 100 capsules by mouth daily.  0  . metoprolol tartrate (LOPRESSOR) 25 MG tablet TAKE 1 TABLET BY MOUTH TWICE A DAY 180 tablet 3  . Multiple Vitamin (MULTIVITAMIN WITH MINERALS) TABS tablet Take 1 tablet by mouth daily.    . Omega-3 Fatty Acids (FISH OIL TRIPLE STRENGTH) 1400 MG CAPS Take 2,800 mg by mouth 2 (two) times daily.    . Saw Palmetto 450 MG CAPS Take 900 mg by mouth 2 (two) times daily.    Marland Kitchen warfarin (COUMADIN) 5 MG tablet Take 5 mg by mouth daily. TAKE 1 AND 1/2 TABLETS BY MOUTH ONCE DAILY EXCEPT TAKE 2 TABLETS ON MONDAY, WEDNESDAY AN FRIDAY     No current facility-administered medications for this visit.     REVIEW OF SYSTEMS:   [X]  denotes positive finding, [ ]  denotes negative finding Cardiac  Comments:  Chest pain or chest pressure:    Shortness of breath upon exertion:    Short of breath when lying flat:    Irregular heart rhythm:        Vascular    Pain in calf, thigh, or hip brought on by ambulation:    Pain in feet at night that wakes you up from your sleep:     Blood clot in your veins:    Leg swelling:         Pulmonary    Oxygen at home:    Productive cough:     Wheezing:         Neurologic    Sudden weakness in arms or legs:     Sudden numbness in arms or legs:     Sudden onset of difficulty speaking or slurred speech:    Temporary loss of vision in one eye:     Problems with dizziness:         Gastrointestinal    Blood in stool:     Vomited blood:         Genitourinary    Burning when urinating:     Blood in urine:        Psychiatric    Major depression:         Hematologic    Bleeding problems:    Problems with blood clotting too easily:        Skin    Rashes or ulcers:        Constitutional    Fever or chills:      PHYSICAL EXAM:   There were no vitals filed for this visit.  GENERAL: The patient is a well-nourished male, in no acute distress. The vital signs are documented above. CARDIAC: There is a regular rate and rhythm.  VASCULAR: I cannot palpate pedal pulses.  Both feet are warm without ulceration PULMONARY: Non-labored respirations ABDOMEN: Soft and non-tender MUSCULOSKELETAL: There are no major deformities or cyanosis. NEUROLOGIC: No focal weakness or paresthesias are detected. SKIN: There are no ulcers or rashes noted. PSYCHIATRIC: The patient has a normal affect.  STUDIES:   I have reviewed the following:  +-------+-----------+-----------+------------+------------+  ABI/TBIToday's ABIToday's TBIPrevious ABIPrevious TBI  +-------+-----------+-----------+------------+------------+  Right 0.95    0.41     0.86    0.55      +-------+-----------+-----------+------------+------------+  Left  0.71    0.00    0.39    0.20      +-------+-----------+-----------+------------+------------+   LE DUPLEX: Right: Total occlusion noted in the posterior tibial artery. Occluded  posterior tibial artery.   Left: Total occlusion noted in the posterior tibial artery. Patent left  fem-  posterior tibial artery bypass graft with no evidence of restenosis.   EVAR: Abdominal Aorta: Patent endovascular aneurysm repair with no evidence of  endoleak.  MEDICAL ISSUES:   Carotid: No significant carotid stenosis by ultrasound several years ago.  AAA: Maximum aortic diameter on ultrasound today was 3.88.  There is no evidence of endoleak.  He will need a repeat ultrasound in 1 year  Right popliteal aneurysm: This was 1.6 cm a few years ago on CT scan.  I will repeat a duplex of this when he returns in 6 months  Left leg bypass: The bypass graft is widely patent without significant stenosis.  This will be reevaluated in 6 months.    Charlena Cross, MD, FACS [facility] [phone] Pager [phone]

## 2019-05-19 ENCOUNTER — Other Ambulatory Visit: Payer: Self-pay | Admitting: *Deleted

## 2019-05-19 DIAGNOSIS — I739 Peripheral vascular disease, unspecified: Secondary | ICD-10-CM

## 2019-05-26 ENCOUNTER — Ambulatory Visit (INDEPENDENT_AMBULATORY_CARE_PROVIDER_SITE_OTHER): Payer: Medicare Other

## 2019-05-26 ENCOUNTER — Other Ambulatory Visit: Payer: Self-pay

## 2019-05-26 DIAGNOSIS — I779 Disorder of arteries and arterioles, unspecified: Secondary | ICD-10-CM | POA: Diagnosis not present

## 2019-05-26 DIAGNOSIS — I739 Peripheral vascular disease, unspecified: Secondary | ICD-10-CM

## 2019-05-26 LAB — POCT INR
INR: 3.3 — AB (ref 2.0–3.0)
PT: 40.1

## 2019-05-26 NOTE — Patient Instructions (Signed)
Description    Dx: PAD Current Coumdin Dose: 10mg  on M, W, F, then 7.5mg  all other days PT: 40.1 INR: 3.3 Today's changes:No changes Recheck: 4 weeks

## 2019-06-10 DIAGNOSIS — Z89422 Acquired absence of other left toe(s): Secondary | ICD-10-CM | POA: Diagnosis not present

## 2019-06-10 DIAGNOSIS — M79675 Pain in left toe(s): Secondary | ICD-10-CM | POA: Diagnosis not present

## 2019-06-10 DIAGNOSIS — I739 Peripheral vascular disease, unspecified: Secondary | ICD-10-CM | POA: Diagnosis not present

## 2019-06-10 DIAGNOSIS — M79674 Pain in right toe(s): Secondary | ICD-10-CM | POA: Diagnosis not present

## 2019-06-10 DIAGNOSIS — B351 Tinea unguium: Secondary | ICD-10-CM | POA: Diagnosis not present

## 2019-06-23 ENCOUNTER — Other Ambulatory Visit: Payer: Self-pay

## 2019-06-23 ENCOUNTER — Ambulatory Visit (INDEPENDENT_AMBULATORY_CARE_PROVIDER_SITE_OTHER): Payer: Medicare Other

## 2019-06-23 DIAGNOSIS — I779 Disorder of arteries and arterioles, unspecified: Secondary | ICD-10-CM | POA: Diagnosis not present

## 2019-06-23 DIAGNOSIS — I739 Peripheral vascular disease, unspecified: Secondary | ICD-10-CM

## 2019-06-23 LAB — POCT INR
INR: 3.2 — AB (ref 2.0–3.0)
PT: 38.2

## 2019-06-23 NOTE — Patient Instructions (Signed)
Continue 7.5mg  a day except 10mg  M, W, F.  Recheck in four weeks.

## 2019-07-12 ENCOUNTER — Other Ambulatory Visit: Payer: Self-pay | Admitting: Family Medicine

## 2019-07-12 NOTE — Telephone Encounter (Signed)
Requested medication (s) are due for refill today: yes  Requested medication (s) are on the active medication list: yes  Last refill:  03/11/19  Future visit scheduled: on 6/8 Nurse visit  Notes to clinic:  This refill not delegated to NT   Requested Prescriptions  Pending Prescriptions Disp Refills   warfarin (COUMADIN) 5 MG tablet [Pharmacy Med Name: WARFARIN SODIUM 5 MG TABLET] 143 tablet 4    Sig: TAKE 1 AND 1/2 TABLETS BY MOUTH ONCE DAILY EXCEPT TAKE 2 TABLETS ON MONDAY      Hematology:  Anticoagulants - warfarin Failed - 07/12/2019  4:02 PM      Failed - This refill cannot be delegated      Failed - If the patient is managed by Coumadin Clinic - route to their Pool. If not, forward to the provider.      Failed - INR in normal range and within 30 days    INR  Date Value Ref Range Status  06/23/2019 3.2 (A) 2.0 - 3.0 Final    Comment:    Continue 7.5mg  a day except 10mg  M, W, F.  Recheck in four weeks.   10/22/2017 1.14  Final    Comment:    Performed at [facility], [address], [place], [place]          Failed - Valid encounter within last 3 months    Recent Outpatient Visits           6 months ago Hyperlipidemia, unspecified hyperlipidemia type   [facility] [name], [facility], [place] [facility]   1 year ago PAD (peripheral artery disease) [facility])   [facility] [name], [facility], [place]   1 year ago PAD (peripheral artery disease) [facility])    [facility] [name], [facility], [place] [facility]   1 year ago Hemoptysis   [facility] [place], [place], [place]   2 years ago [place] for Audiological scientist annual wellness exam   [employer], [place], [place]

## 2019-07-21 ENCOUNTER — Other Ambulatory Visit: Payer: Self-pay

## 2019-07-21 ENCOUNTER — Ambulatory Visit (INDEPENDENT_AMBULATORY_CARE_PROVIDER_SITE_OTHER): Payer: Medicare Other

## 2019-07-21 DIAGNOSIS — I779 Disorder of arteries and arterioles, unspecified: Secondary | ICD-10-CM

## 2019-07-21 DIAGNOSIS — I739 Peripheral vascular disease, unspecified: Secondary | ICD-10-CM

## 2019-07-21 LAB — POCT INR
INR: 3.7 — AB (ref 2.0–3.0)
PT: 44.1

## 2019-07-21 NOTE — Patient Instructions (Signed)
Description   Dx: PAD Current Coumdin Dose: 10mg  on M, W, F, then 7.5mg  all other days PT: 44.1 INR: 3.7 Today's changes:Eat a salad with greens in it today. Continue current dosage. Recheck: 2 weeks

## 2019-08-04 ENCOUNTER — Ambulatory Visit (INDEPENDENT_AMBULATORY_CARE_PROVIDER_SITE_OTHER): Payer: Medicare Other | Admitting: *Deleted

## 2019-08-04 ENCOUNTER — Other Ambulatory Visit: Payer: Self-pay

## 2019-08-04 DIAGNOSIS — I739 Peripheral vascular disease, unspecified: Secondary | ICD-10-CM

## 2019-08-04 LAB — POCT INR
INR: 3.6 — AB (ref 2.0–3.0)
PT: 43.2

## 2019-08-04 NOTE — Patient Instructions (Signed)
Hold today's dose then continuee current dose. 10mg  on M, W, F, then 7.5mg  all other days. Recheck: 3 weeks

## 2019-08-25 ENCOUNTER — Other Ambulatory Visit: Payer: Self-pay

## 2019-08-25 ENCOUNTER — Ambulatory Visit (INDEPENDENT_AMBULATORY_CARE_PROVIDER_SITE_OTHER): Payer: Medicare Other

## 2019-08-25 DIAGNOSIS — I779 Disorder of arteries and arterioles, unspecified: Secondary | ICD-10-CM

## 2019-08-25 DIAGNOSIS — I739 Peripheral vascular disease, unspecified: Secondary | ICD-10-CM

## 2019-08-25 LAB — POCT INR
INR: 3.4 — AB (ref 2.0–3.0)
PT: 40.8

## 2019-08-25 NOTE — Patient Instructions (Signed)
Description   Dx: PAD Current Coumdin Dose: 10mg  on M, W, F, then 7.5mg  all other days PT: 40.8 INR: 3.4 Today's changes:None Recheck: 4 weeks

## 2019-09-22 ENCOUNTER — Other Ambulatory Visit: Payer: Self-pay

## 2019-09-22 ENCOUNTER — Ambulatory Visit (INDEPENDENT_AMBULATORY_CARE_PROVIDER_SITE_OTHER): Payer: Medicare Other

## 2019-09-22 DIAGNOSIS — I739 Peripheral vascular disease, unspecified: Secondary | ICD-10-CM

## 2019-09-22 LAB — POCT INR
INR: 3.7 — AB (ref 2.0–3.0)
Prothrombin Time: 44.6

## 2019-09-22 NOTE — Patient Instructions (Signed)
Description   Dx: DVT Current Coumdin Dose: 10mg  on M, W, F, then 7.5mg  all other days PT: 44.6 INR: 3.7 Today's changes:Hold x2days, then resume dosage at 7.5mg  except on Mon & Friday take 10mg  Recheck: 3 weeks

## 2019-10-13 ENCOUNTER — Other Ambulatory Visit: Payer: Self-pay

## 2019-10-13 ENCOUNTER — Ambulatory Visit (INDEPENDENT_AMBULATORY_CARE_PROVIDER_SITE_OTHER): Payer: Medicare Other

## 2019-10-13 DIAGNOSIS — I779 Disorder of arteries and arterioles, unspecified: Secondary | ICD-10-CM | POA: Diagnosis not present

## 2019-10-13 DIAGNOSIS — I739 Peripheral vascular disease, unspecified: Secondary | ICD-10-CM

## 2019-10-13 LAB — POCT INR
INR: 3.3 — AB (ref 2.0–3.0)
PT: 39.1

## 2019-10-13 NOTE — Patient Instructions (Signed)
Description   Dx: DVT Current Coumdin Dose: 10mg  on M and F, then 7.5mg  all other days PT: 39.1 INR: 3.3 Today's changes:None Recheck: 3 weeks

## 2019-11-03 ENCOUNTER — Other Ambulatory Visit: Payer: Self-pay

## 2019-11-03 ENCOUNTER — Ambulatory Visit (INDEPENDENT_AMBULATORY_CARE_PROVIDER_SITE_OTHER): Payer: Medicare Other

## 2019-11-03 DIAGNOSIS — I779 Disorder of arteries and arterioles, unspecified: Secondary | ICD-10-CM

## 2019-11-03 DIAGNOSIS — I739 Peripheral vascular disease, unspecified: Secondary | ICD-10-CM

## 2019-11-03 LAB — POCT INR
INR: 3.8 — AB (ref 2.0–3.0)
PT: 45.5

## 2019-11-03 NOTE — Patient Instructions (Signed)
Description   Dx: DVT Current Coumdin Dose: 10mg  on M and F, then 7.5mg  all other days PT: 45.5 INR: 3.8 Today's changes:hold for 2 days. Then resume current dose Recheck: 3 weeks

## 2019-11-04 ENCOUNTER — Ambulatory Visit: Payer: Self-pay

## 2019-11-25 ENCOUNTER — Ambulatory Visit (INDEPENDENT_AMBULATORY_CARE_PROVIDER_SITE_OTHER): Payer: Medicare Other

## 2019-11-25 ENCOUNTER — Other Ambulatory Visit: Payer: Self-pay

## 2019-11-25 DIAGNOSIS — I739 Peripheral vascular disease, unspecified: Secondary | ICD-10-CM | POA: Diagnosis not present

## 2019-11-25 DIAGNOSIS — I779 Disorder of arteries and arterioles, unspecified: Secondary | ICD-10-CM

## 2019-11-25 LAB — POCT INR
INR: 3.5 — AB (ref 2.0–3.0)
PT: 41.6

## 2019-11-25 NOTE — Patient Instructions (Signed)
Description   10mg  on M and F, then 7.5mg  all other days

## 2019-12-23 ENCOUNTER — Ambulatory Visit (INDEPENDENT_AMBULATORY_CARE_PROVIDER_SITE_OTHER): Payer: Medicare Other

## 2019-12-23 ENCOUNTER — Other Ambulatory Visit: Payer: Self-pay

## 2019-12-23 DIAGNOSIS — I779 Disorder of arteries and arterioles, unspecified: Secondary | ICD-10-CM | POA: Diagnosis not present

## 2019-12-23 DIAGNOSIS — I739 Peripheral vascular disease, unspecified: Secondary | ICD-10-CM

## 2019-12-23 LAB — POCT INR
INR: 4.3 — AB (ref 2.0–3.0)
PT: 52.1

## 2019-12-23 NOTE — Patient Instructions (Signed)
Description   Hold for 2 days and then 7.5 mg daily. F/U in 2 weeks

## 2020-01-06 ENCOUNTER — Other Ambulatory Visit: Payer: Self-pay

## 2020-01-06 ENCOUNTER — Ambulatory Visit (INDEPENDENT_AMBULATORY_CARE_PROVIDER_SITE_OTHER): Payer: Medicare Other

## 2020-01-06 DIAGNOSIS — I739 Peripheral vascular disease, unspecified: Secondary | ICD-10-CM | POA: Diagnosis not present

## 2020-01-06 LAB — POCT INR
INR: 2.7 (ref 2.0–3.0)
PT: 32.2

## 2020-01-06 NOTE — Patient Instructions (Signed)
Description   7.5 mg daily. F/U in 4 weeks      

## 2020-01-10 ENCOUNTER — Other Ambulatory Visit: Payer: Self-pay | Admitting: Cardiovascular Disease

## 2020-01-12 ENCOUNTER — Other Ambulatory Visit: Payer: Self-pay

## 2020-01-12 DIAGNOSIS — I714 Abdominal aortic aneurysm, without rupture, unspecified: Secondary | ICD-10-CM

## 2020-01-12 DIAGNOSIS — I739 Peripheral vascular disease, unspecified: Secondary | ICD-10-CM

## 2020-01-18 ENCOUNTER — Ambulatory Visit: Payer: Medicare Other | Admitting: Surgery

## 2020-01-18 ENCOUNTER — Encounter (HOSPITAL_COMMUNITY): Payer: Medicare Other

## 2020-01-27 ENCOUNTER — Telehealth: Payer: Self-pay | Admitting: Family Medicine

## 2020-01-27 NOTE — Telephone Encounter (Signed)
Copied from CRM [id number]. Topic: Medicare AWV >> Jan 27, 2020  3:10 PM Claudette Laws R wrote: Reason for CRM:  Left message for patient to call back and schedule Medicare Annual Wellness Visit (AWV) in office.   If not able to come in office, please offer to do virtually.   Last AWV 02/02/2019  Please schedule at anytime with [facility] Health Advisor.  If any questions, please contact me at [phone]

## 2020-02-03 ENCOUNTER — Ambulatory Visit: Payer: Medicare Other

## 2020-02-03 ENCOUNTER — Telehealth: Payer: Self-pay

## 2020-02-03 NOTE — Telephone Encounter (Signed)
Appointment rescheduled to 02/10/2020 at 10:10am.

## 2020-02-03 NOTE — Progress Notes (Addendum)
Subjective:   [name] [name] is a 74 y.o. male who presents for Medicare Annual/Subsequent preventive examination.  I connected with [name] [name] today by telephone and verified that I am speaking with the correct person using two identifiers. Location patient: home Location provider: work Persons participating in the virtual visit: patient, provider.   I discussed the limitations, risks, security and privacy concerns of performing an evaluation and management service by telephone and the availability of in person appointments. I also discussed with the patient that there may be a patient responsible charge related to this service. The patient expressed understanding and verbally consented to this telephonic visit.    Interactive audio and video telecommunications were attempted between this provider and patient, however failed, due to patient having technical difficulties OR patient did not have access to video capability.  We continued and completed visit with audio only.   Review of Systems    N/A  Cardiac Risk Factors include: advanced age (>80men, >79 women);dyslipidemia;male gender;hypertension     Objective:    There were no vitals filed for this visit. There is no height or weight on file to calculate BMI.  Advanced Directives 02/08/2020 02/02/2019 11/03/2018 04/28/2018 01/28/2018 10/22/2017 12/28/2016  Does Patient Have a Medical Advance Directive? No No No No No No No  Does patient want to make changes to medical advance directive? - - - - - - -  Would patient like information on creating a medical advance directive? No - Patient declined No - Patient declined No - Patient declined No - Patient declined No - Patient declined No - Patient declined Yes (ED - Information included in AVS)    Current Medications (verified) Outpatient Encounter Medications as of 02/08/2020  Medication Sig   Aspirin Buf,CaCarb-MgCarb-MgO, 81 MG TABS Take 1 tablet by mouth daily.    atorvastatin (LIPITOR) 40 MG tablet TAKE 1 TABLET BY MOUTH EVERY DAY   Coenzyme Q10 (CO Q-10) 100 MG CAPS Take 100 capsules by mouth daily.   metoprolol tartrate (LOPRESSOR) 25 MG tablet TAKE 1 TABLET BY MOUTH TWICE A DAY   Multiple Vitamin (MULTIVITAMIN WITH MINERALS) TABS tablet Take 1 tablet by mouth daily.   Omega-3 Fatty Acids (FISH OIL TRIPLE STRENGTH) 1400 MG CAPS Take 2,800 mg by mouth 2 (two) times daily.   Saw Palmetto 450 MG CAPS Take 900 mg by mouth 2 (two) times daily.   warfarin (COUMADIN) 5 MG tablet TAKE 1 AND 1/2 TABLETS BY MOUTH ONCE DAILY EXCEPT TAKE 2 TABLETS ON MONDAY (Patient taking differently: Take 7 mg by mouth one time only at 4 PM.)   No facility-administered encounter medications on file as of 02/08/2020.    Allergies (verified) Omnipaque [iohexol], Oxytetracycline, and Alteplase   History: Past Medical History:  Diagnosis Date   Abdominal aortic aneurysm (HCC)    Chronic kidney disease    Right kidney stone   Coronary artery disease    Detached retina    Hyperlipidemia    Hypertension    Myocardial infarction [facility])    2007 heart stent   Popliteal aneurysm [facility])    Past Surgical History:  Procedure Laterality Date   ABDOMINAL AORTIC ANEURYSM REPAIR  11/24/09   EVAR   ABDOMINAL AORTOGRAM W/LOWER EXTREMITY N/A 10/22/2017   Procedure: ABDOMINAL AORTOGRAM W/LOWER EXTREMITY;  Surgeon: Nada Libman, MD;  Location: [facility] INVASIVE CV LAB;  Service: Cardiovascular;  Laterality: N/A;   AMPUTATION Left 12/10/2013   Procedure: AMPUTATION LEFT  FOURTH AND FIFTH TOES;  Surgeon:  Nada Libman, MD;  Location: [facility] OR;  Service: Vascular;  Laterality: Left;   COLONOSCOPY     coronary stenting     2007   EYE SURGERY Right    cataract removed   FEMORAL-POPLITEAL BYPASS GRAFT Left 10/29/2013   Procedure: BYPASS GRAFT FEMORAL-POPLITEAL ARTERY;  Surgeon: Nada Libman, MD;  Location: [facility] OR;  Service: Vascular;  Laterality: Left;  Left Femoral to Posterior Tibial  artery bypass graft using nonreversed saphenous vein. and thrombectomy of Tibial arterly.   FEMORAL-TIBIAL BYPASS GRAFT Left 10/30/2013   Procedure: Reexploration of Femoral-Posterior Tibial Bypass; Thrombectomy of Bypass; Left Leg Angiogram; Posterior Tibial Vein Patch Angioplasty;  Surgeon: Nada Libman, MD;  Location: [facility] OR;  Service: Vascular;  Laterality: Left;   LOWER EXTREMITY ANGIOGRAM N/A 05/25/2014   Procedure: LOWER EXTREMITY ANGIOGRAM;  Surgeon: Nada Libman, MD;  Location: [facility] CATH LAB;  Service: Cardiovascular;  Laterality: N/A;   PERIPHERAL VASCULAR BALLOON ANGIOPLASTY Left 10/22/2017   Procedure: PERIPHERAL VASCULAR BALLOON ANGIOPLASTY;  Surgeon: Nada Libman, MD;  Location: [facility] INVASIVE CV LAB;  Service: Cardiovascular;  Laterality: Left;  fem pop bypass   PERIPHERAL VASCULAR CATHETERIZATION Left 10/04/2015   Procedure: Lower Extremity Angiography;  Surgeon: Nada Libman, MD;  Location: [facility] INVASIVE CV LAB;  Service: Cardiovascular;  Laterality: Left;   PERIPHERAL VASCULAR CATHETERIZATION Left 10/04/2015   Procedure: Peripheral Vascular Intervention;  Surgeon: Nada Libman, MD;  Location: [facility] INVASIVE CV LAB;  Service: Cardiovascular;  Laterality: Left;   POPLITEAL ARTERY STENT  11/2009   left popliteal artery    RETINAL DETACHMENT SURGERY Right 2011   TOOTH EXTRACTION  June 2015   Wisdom Tooth Extraction   Family History  Problem Relation Age of Onset   Aneurysm Sister        possibly    Peripheral vascular disease Brother    Social History   Socioeconomic History   Marital status: Married    Spouse name: Not on file   Number of children: 1   Years of education: Not on file   Highest education level: Associate degree: occupational, Scientist, product/process development, or vocational program  Occupational History   Occupation: Retired  Tobacco Use   Smoking status: Former Smoker    Years: 15.00    Types: Cigarettes    Quit date: 09/21/1985    Years since quitting: 34.4   Smokeless  tobacco: Current User    Types: Chew   Tobacco comment: once a day chews tobacco  Vaping Use   Vaping Use: Never used  Substance and Sexual Activity   Alcohol use: Yes    Alcohol/week: 0.0 - 1.0 standard drinks    Comment: 1 beer ocassionally (mostly in summer)   Drug use: No   Sexual activity: Not on file  Other Topics Concern   Not on file  Social History Narrative   Lives with family.   Social Determinants of Health   Financial Resource Strain: Low Risk    Difficulty of Paying Living Expenses: Not hard at all  Food Insecurity: No Food Insecurity   Worried About Programme researcher, broadcasting/film/video in the Last Year: Never true   Ran Out of Food in the Last Year: Never true  Transportation Needs: No Transportation Needs   Lack of Transportation (Medical): No   Lack of Transportation (Non-Medical): No  Physical Activity: Sufficiently Active   Days of Exercise per Week: 7 days   Minutes of Exercise per Session: 30 min  Stress: No Stress Concern  Present   Feeling of Stress : Not at all  Social Connections: Moderately Integrated   Frequency of Communication with Friends and Family: Three times a week   Frequency of Social Gatherings with Friends and Family: More than three times a week   Attends Religious Services: 1 to 4 times per year   Active Member of [employer] or Organizations: No   Attends Engineer, structural: Never   Marital Status: Married    Tobacco Counseling Ready to quit: Not Answered Counseling given: Not Answered Comment: once a day chews tobacco   Clinical Intake:  Pre-visit preparation completed: Yes  Pain : No/denies pain     Nutritional Risks: None Diabetes: No  How often do you need to have someone help you when you read instructions, pamphlets, or other written materials from your doctor or pharmacy?: 1 - Never  Diabetic? No  Interpreter Needed?: No  Information entered by :: [facility], LPN   Activities of Daily Living In your present state of  health, do you have any difficulty performing the following activities: 02/08/2020  Hearing? N  Vision? N  Difficulty concentrating or making decisions? N  Walking or climbing stairs? N  Dressing or bathing? N  Doing errands, shopping? N  Preparing Food and eating ? N  Using the Toilet? N  In the past six months, have you accidently leaked urine? N  Do you have problems with loss of bowel control? N  Managing your Medications? N  Managing your Finances? N  Housekeeping or managing your Housekeeping? N  Some recent data might be hidden    Patient Care Team: [name], Jodell Cipro, PA-C as PCP - General (Family Medicine) Tonny Bollman, MD as PCP - Cardiology (Cardiology) Nada Libman, MD as Consulting Physician (Vascular Surgery) Pa, [facility]  Indicate any recent Medical Services you may have received from other than [facility] providers in the past year (date may be approximate).     Assessment:   This is a routine wellness examination for [facility].  Hearing/Vision screen No exam data present  Dietary issues and exercise activities discussed: Current Exercise Habits: Home exercise routine, Type of exercise: walking, Time (Minutes): 30, Frequency (Times/Week): 7, Weekly Exercise (Minutes/Week): 210, Intensity: Mild, Exercise limited by: None identified  Goals      DIET - INCREASE WATER INTAKE     Recommend increasing water intake to 4-6 glasses a day.       Depression Screen PHQ 2/9 Scores 02/08/2020 02/02/2019 02/02/2019 01/28/2018 12/28/2016 11/25/2015  PHQ - 2 Score 0 0 0 0 0 0    Fall Risk Fall Risk  02/08/2020 02/02/2019 01/07/2019 01/28/2018 12/28/2016  Falls in the past year? 0 0 0 0 No  Comment - - Emmi Telephone Survey: data to providers prior to load - -  Number falls in past yr: 0 0 - 0 -  Injury with Fall? 0 0 - 0 -    FALL RISK PREVENTION PERTAINING TO THE HOME:  Any stairs in or around the home? Yes  If so, are there any without handrails?  No  Home free of loose throw rugs in walkways, pet beds, electrical cords, etc? Yes  Adequate lighting in your home to reduce risk of falls? Yes   ASSISTIVE DEVICES UTILIZED TO PREVENT FALLS:  Life alert? No  Use of a cane, walker or w/c? No  Grab bars in the bathroom? Yes  Shower chair or bench in shower? No  Elevated toilet seat or a  handicapped toilet? No   Cognitive Function: Normal cognitive status assessed by observation by this Nurse Health Advisor. No abnormalities found.       6CIT Screen 02/02/2019 01/28/2018  What Year? 0 points 0 points  What month? 0 points 0 points  What time? 0 points 0 points  Count back from 20 0 points 0 points  Months in reverse 0 points 0 points  Repeat phrase 4 points 2 points  Total Score 4 2    Immunizations Immunization History  Administered Date(s) Administered   Fluad Quad(high Dose 65+) 02/03/2019   Influenza, High Dose Seasonal PF 01/14/2015, 11/25/2015, 11/30/2016, 12/06/2017   Pneumococcal Conjugate-13 01/25/2017   Pneumococcal Polysaccharide-23 01/28/2018   Tdap 12/17/2013    TDAP status: Up to date  Flu Vaccine status: Due, Education has been provided regarding the importance of this vaccine. Advised may receive this vaccine at local pharmacy or Health Dept. Aware to provide a copy of the vaccination record if obtained from local pharmacy or Health Dept. Verbalized acceptance and understanding.  Pneumococcal vaccine status: Up to date  Covid-19 vaccine status: Completed vaccines  Qualifies for Shingles Vaccine? Yes   Zostavax completed No   Shingrix Completed?: No.    Education has been provided regarding the importance of this vaccine. Patient has been advised to call insurance company to determine out of pocket expense if they have not yet received this vaccine. Advised may also receive vaccine at local pharmacy or Health Dept. Verbalized acceptance and understanding.  Screening Tests Health Maintenance  Topic Date  Due   COVID-19 Vaccine (1) Never done   HEMOGLOBIN A1C  04/24/2014   INFLUENZA VACCINE  09/13/2019   TETANUS/TDAP  12/18/2023   Hepatitis C Screening  Completed   PNA vac Low Risk Adult  Completed    Health Maintenance  Health Maintenance Due  Topic Date Due   COVID-19 Vaccine (1) Never done   HEMOGLOBIN A1C  04/24/2014   INFLUENZA VACCINE  09/13/2019    Colorectal cancer screening: No longer required.   Lung Cancer Screening: (Low Dose CT Chest recommended if Age 58-80 years, 30 pack-year currently smoking OR have quit w/in 15years.) does not qualify.    Additional Screening:  Hepatitis C Screening: Up to date  Vision Screening: Recommended annual ophthalmology exams for early detection of glaucoma and other disorders of the eye. Is the patient up to date with their annual eye exam?  Yes  Who is the provider or what is the name of the office in which the patient attends annual eye exams? [facility] If pt is not established with a provider, would they like to be referred to a provider to establish care? No .   Dental Screening: Recommended annual dental exams for proper oral hygiene  Community Resource Referral / Chronic Care Management: CRR required this visit?  No   CCM required this visit?  No      Plan:     I have personally reviewed and noted the following in the patient's chart:   Medical and social history Use of alcohol, tobacco or illicit drugs  Current medications and supplements Functional ability and status Nutritional status Physical activity Advanced directives List of other physicians Hospitalizations, surgeries, and ER visits in previous 12 months Vitals Screenings to include cognitive, depression, and falls Referrals and appointments  In addition, I have reviewed and discussed with patient certain preventive protocols, quality metrics, and best practice recommendations. A written personalized care plan for preventive services as  well as general preventive health recommendations were provided to patient.     Joaopedro Eschbach [place], [place]   54/09/811912/27/2021   Nurse Notes: Pt needs a flu shot and Hgb A1c check at next in office apt. Pt has received his Covid vaccines. Requested that he bring the vaccine card into next in office apt to up date in chart.   Reviewed note and plan of Nurse Health Advisor. Was available for consultation during screening. Agree with documentation and recommendations.

## 2020-02-03 NOTE — Telephone Encounter (Signed)
Copied from CRM [id number]. Topic: General - Other >> Feb 03, 2020 12:07 PM Jaquita Rector A wrote: Reason for CRM: Patient called in to say that he overslept and missed his appointment for INR asking for a call back to reschedule. Please call Ph# [phone]

## 2020-02-08 ENCOUNTER — Other Ambulatory Visit: Payer: Self-pay

## 2020-02-08 ENCOUNTER — Ambulatory Visit (INDEPENDENT_AMBULATORY_CARE_PROVIDER_SITE_OTHER): Payer: Medicare Other

## 2020-02-08 ENCOUNTER — Other Ambulatory Visit: Payer: Self-pay | Admitting: Family Medicine

## 2020-02-08 ENCOUNTER — Telehealth: Payer: Self-pay

## 2020-02-08 DIAGNOSIS — I10 Essential (primary) hypertension: Secondary | ICD-10-CM

## 2020-02-08 DIAGNOSIS — I739 Peripheral vascular disease, unspecified: Secondary | ICD-10-CM

## 2020-02-08 DIAGNOSIS — Z0000 Encounter for general adult medical examination without abnormal findings: Secondary | ICD-10-CM | POA: Diagnosis not present

## 2020-02-08 DIAGNOSIS — E785 Hyperlipidemia, unspecified: Secondary | ICD-10-CM

## 2020-02-08 NOTE — Telephone Encounter (Signed)
Future orders for labs placed in chart to be released when ready to get tests done.

## 2020-02-08 NOTE — Patient Instructions (Signed)
[name] [name] , Thank you for taking time to come for your Medicare Wellness Visit. I appreciate your ongoing commitment to your health goals. Please review the following plan we discussed and let me know if I can assist you in the future.   Screening recommendations/referrals: Colonoscopy: No longer required.  Recommended yearly ophthalmology/optometry visit for glaucoma screening and checkup Recommended yearly dental visit for hygiene and checkup  Vaccinations: Influenza vaccine: Currently due, pt to receive at next in office apt. Pneumococcal vaccine: Completed series Tdap vaccine: Up to date, due 12/2023 Shingles vaccine: Shingrix discussed. Please contact your pharmacy for coverage information.     Advanced directives: Please bring a copy of your POA (Power of Hurley) and/or Living Will to the office, once completed.  Conditions/risks identified: Continue to increase water intake to 6-8 8 oz glasses a day. Recommend a yearly flu shot.  Next appointment: 02/18/20 @ 1:20 PM with Dortha Kern  Preventive Care 65 Years and Older, Male Preventive care refers to lifestyle choices and visits with your health care provider that can promote health and wellness. What does preventive care include?  A yearly physical exam. This is also called an annual well check.  Dental exams once or twice a year.  Routine eye exams. Ask your health care provider how often you should have your eyes checked.  Personal lifestyle choices, including:  Daily care of your teeth and gums.  Regular physical activity.  Eating a healthy diet.  Avoiding tobacco and drug use.  Limiting alcohol use.  Practicing safe sex.  Taking low doses of aspirin every day.  Taking vitamin and mineral supplements as recommended by your health care provider. What happens during an annual well check? The services and screenings done by your health care provider during your annual well check will depend on your age,  overall health, lifestyle risk factors, and family history of disease. Counseling  Your health care provider may ask you questions about your:  Alcohol use.  Tobacco use.  Drug use.  Emotional well-being.  Home and relationship well-being.  Sexual activity.  Eating habits.  History of falls.  Memory and ability to understand (cognition).  Work and work Astronomer. Screening  You may have the following tests or measurements:  Height, weight, and BMI.  Blood pressure.  Lipid and cholesterol levels. These may be checked every 5 years, or more frequently if you are over 31 years old.  Skin check.  Lung cancer screening. You may have this screening every year starting at age 46 if you have a 30-pack-year history of smoking and currently smoke or have quit within the past 15 years.  Fecal occult blood test (FOBT) of the stool. You may have this test every year starting at age 59.  Flexible sigmoidoscopy or colonoscopy. You may have a sigmoidoscopy every 5 years or a colonoscopy every 10 years starting at age 56.  Prostate cancer screening. Recommendations will vary depending on your family history and other risks.  Hepatitis C blood test.  Hepatitis B blood test.  Sexually transmitted disease (STD) testing.  Diabetes screening. This is done by checking your blood sugar (glucose) after you have not eaten for a while (fasting). You may have this done every 1-3 years.  Abdominal aortic aneurysm (AAA) screening. You may need this if you are a current or former smoker.  Osteoporosis. You may be screened starting at age 39 if you are at high risk. Talk with your health care provider about your test results, treatment  options, and if necessary, the need for more tests. Vaccines  Your health care provider may recommend certain vaccines, such as:  Influenza vaccine. This is recommended every year.  Tetanus, diphtheria, and acellular pertussis (Tdap, Td) vaccine. You may  need a Td booster every 10 years.  Zoster vaccine. You may need this after age 37.  Pneumococcal 13-valent conjugate (PCV13) vaccine. One dose is recommended after age 69.  Pneumococcal polysaccharide (PPSV23) vaccine. One dose is recommended after age 28. Talk to your health care provider about which screenings and vaccines you need and how often you need them. This information is not intended to replace advice given to you by your health care provider. Make sure you discuss any questions you have with your health care provider. Document Released: 02/25/2015 Document Revised: 10/19/2015 Document Reviewed: 11/30/2014 Elsevier Interactive Patient Education  2017 [employer] Prevention in the Home Falls can cause injuries. They can happen to people of all ages. There are many things you can do to make your home safe and to help prevent falls. What can I do on the outside of my home?  Regularly fix the edges of walkways and driveways and fix any cracks.  Remove anything that might make you trip as you walk through a door, such as a raised step or threshold.  Trim any bushes or trees on the path to your home.  Use bright outdoor lighting.  Clear any walking paths of anything that might make someone trip, such as rocks or tools.  Regularly check to see if handrails are loose or broken. Make sure that both sides of any steps have handrails.  Any raised decks and porches should have guardrails on the edges.  Have any leaves, snow, or ice cleared regularly.  Use sand or salt on walking paths during winter.  Clean up any spills in your garage right away. This includes oil or grease spills. What can I do in the bathroom?  Use night lights.  Install grab bars by the toilet and in the tub and shower. Do not use towel bars as grab bars.  Use non-skid mats or decals in the tub or shower.  If you need to sit down in the shower, use a plastic, non-slip stool.  Keep the floor  dry. Clean up any water that spills on the floor as soon as it happens.  Remove soap buildup in the tub or shower regularly.  Attach bath mats securely with double-sided non-slip rug tape.  Do not have throw rugs and other things on the floor that can make you trip. What can I do in the bedroom?  Use night lights.  Make sure that you have a light by your bed that is easy to reach.  Do not use any sheets or blankets that are too big for your bed. They should not hang down onto the floor.  Have a firm chair that has side arms. You can use this for support while you get dressed.  Do not have throw rugs and other things on the floor that can make you trip. What can I do in the kitchen?  Clean up any spills right away.  Avoid walking on wet floors.  Keep items that you use a lot in easy-to-reach places.  If you need to reach something above you, use a strong step stool that has a grab bar.  Keep electrical cords out of the way.  Do not use floor polish or wax that makes floors slippery.  If you must use wax, use non-skid floor wax.  Do not have throw rugs and other things on the floor that can make you trip. What can I do with my stairs?  Do not leave any items on the stairs.  Make sure that there are handrails on both sides of the stairs and use them. Fix handrails that are broken or loose. Make sure that handrails are as long as the stairways.  Check any carpeting to make sure that it is firmly attached to the stairs. Fix any carpet that is loose or worn.  Avoid having throw rugs at the top or bottom of the stairs. If you do have throw rugs, attach them to the floor with carpet tape.  Make sure that you have a light switch at the top of the stairs and the bottom of the stairs. If you do not have them, ask someone to add them for you. What else can I do to help prevent falls?  Wear shoes that:  Do not have high heels.  Have rubber bottoms.  Are comfortable and fit you  well.  Are closed at the toe. Do not wear sandals.  If you use a stepladder:  Make sure that it is fully opened. Do not climb a closed stepladder.  Make sure that both sides of the stepladder are locked into place.  Ask someone to hold it for you, if possible.  Clearly mark and make sure that you can see:  Any grab bars or handrails.  First and last steps.  Where the edge of each step is.  Use tools that help you move around (mobility aids) if they are needed. These include:  Canes.  Walkers.  Scooters.  Crutches.  Turn on the lights when you go into a dark area. Replace any light bulbs as soon as they burn out.  Set up your furniture so you have a clear path. Avoid moving your furniture around.  If any of your floors are uneven, fix them.  If there are any pets around you, be aware of where they are.  Review your medicines with your doctor. Some medicines can make you feel dizzy. This can increase your chance of falling. Ask your doctor what other things that you can do to help prevent falls. This information is not intended to replace advice given to you by your health care provider. Make sure you discuss any questions you have with your health care provider. Document Released: 11/25/2008 Document Revised: 07/07/2015 Document Reviewed: 03/05/2014 Elsevier Interactive Patient Education  2017 [employer].

## 2020-02-08 NOTE — Telephone Encounter (Signed)
Copied from CRM [id number]. Topic: General - Other >> Feb 08, 2020  2:10 PM Randol Kern wrote: Reason for CRM: Pt is requesting lab orders to be printed for [facility] to go [facility], he comes in Wednesday for coumadin.   Best contact: [phone]

## 2020-02-08 NOTE — Telephone Encounter (Signed)
Pt has physical on 02/18/20

## 2020-02-10 ENCOUNTER — Other Ambulatory Visit: Payer: Self-pay

## 2020-02-10 ENCOUNTER — Ambulatory Visit (INDEPENDENT_AMBULATORY_CARE_PROVIDER_SITE_OTHER): Payer: Medicare Other

## 2020-02-10 DIAGNOSIS — I739 Peripheral vascular disease, unspecified: Secondary | ICD-10-CM

## 2020-02-10 DIAGNOSIS — Z23 Encounter for immunization: Secondary | ICD-10-CM | POA: Diagnosis not present

## 2020-02-10 DIAGNOSIS — E785 Hyperlipidemia, unspecified: Secondary | ICD-10-CM

## 2020-02-10 DIAGNOSIS — I10 Essential (primary) hypertension: Secondary | ICD-10-CM | POA: Diagnosis not present

## 2020-02-10 DIAGNOSIS — I779 Disorder of arteries and arterioles, unspecified: Secondary | ICD-10-CM | POA: Diagnosis not present

## 2020-02-10 LAB — POCT INR
INR: 2.7 (ref 2.0–3.0)
PT: 32.9

## 2020-02-10 NOTE — Patient Instructions (Signed)
Description   7.5 mg daily. F/U in 4 weeks      

## 2020-02-11 ENCOUNTER — Encounter: Payer: Self-pay | Admitting: Family Medicine

## 2020-02-11 ENCOUNTER — Telehealth: Payer: Self-pay

## 2020-02-11 LAB — COMPREHENSIVE METABOLIC PANEL
ALT: 32 IU/L (ref 0–44)
AST: 31 IU/L (ref 0–40)
Albumin/Globulin Ratio: 2 (ref 1.2–2.2)
Albumin: 4.3 g/dL (ref 3.7–4.7)
Alkaline Phosphatase: 75 IU/L (ref 44–121)
BUN/Creatinine Ratio: 9 — ABNORMAL LOW (ref 10–24)
BUN: 11 mg/dL (ref 8–27)
Bilirubin Total: 2 mg/dL — ABNORMAL HIGH (ref 0.0–1.2)
CO2: 19 mmol/L — ABNORMAL LOW (ref 20–29)
Calcium: 9.5 mg/dL (ref 8.6–10.2)
Chloride: 108 mmol/L — ABNORMAL HIGH (ref 96–106)
Creatinine, Ser: 1.21 mg/dL (ref 0.76–1.27)
GFR calc Af Amer: 67 mL/min/{1.73_m2} (ref >59)
GFR calc non Af Amer: 58 mL/min/{1.73_m2} — ABNORMAL LOW (ref >59)
Globulin, Total: 2.2 g/dL (ref 1.5–4.5)
Glucose: 110 mg/dL — ABNORMAL HIGH (ref 65–99)
Potassium: 4.4 mmol/L (ref 3.5–5.2)
Sodium: 141 mmol/L (ref 134–144)
Total Protein: 6.5 g/dL (ref 6.0–8.5)

## 2020-02-11 LAB — CBC WITH DIFFERENTIAL/PLATELET
Basophils Absolute: 0.1 10*3/uL (ref 0.0–0.2)
Basos: 1 %
EOS (ABSOLUTE): 0.4 10*3/uL (ref 0.0–0.4)
Eos: 5 %
Hematocrit: 47.1 % (ref 37.5–51.0)
Hemoglobin: 16.1 g/dL (ref 13.0–17.7)
Immature Grans (Abs): 0 10*3/uL (ref 0.0–0.1)
Immature Granulocytes: 0 %
Lymphocytes Absolute: 1.8 10*3/uL (ref 0.7–3.1)
Lymphs: 26 %
MCH: 31.9 pg (ref 26.6–33.0)
MCHC: 34.2 g/dL (ref 31.5–35.7)
MCV: 93 fL (ref 79–97)
Monocytes Absolute: 0.7 10*3/uL (ref 0.1–0.9)
Monocytes: 10 %
Neutrophils Absolute: 3.8 10*3/uL (ref 1.4–7.0)
Neutrophils: 58 %
Platelets: 240 10*3/uL (ref 150–450)
RBC: 5.05 x10E6/uL (ref 4.14–5.80)
RDW: 13 % (ref 11.6–15.4)
WBC: 6.8 10*3/uL (ref 3.4–10.8)

## 2020-02-11 LAB — LIPID PANEL
Chol/HDL Ratio: 4.7 ratio (ref 0.0–5.0)
Cholesterol, Total: 123 mg/dL (ref 100–199)
HDL: 26 mg/dL — ABNORMAL LOW (ref >39)
LDL Chol Calc (NIH): 57 mg/dL (ref 0–99)
Triglycerides: 249 mg/dL — ABNORMAL HIGH (ref 0–149)
VLDL Cholesterol Cal: 40 mg/dL (ref 5–40)

## 2020-02-11 LAB — TSH: TSH: 5.04 u[IU]/mL — ABNORMAL HIGH (ref 0.450–4.500)

## 2020-02-11 NOTE — Telephone Encounter (Signed)
See result note with labs.

## 2020-02-11 NOTE — Telephone Encounter (Signed)
-----   Message from Dennis E [name], PA-C sent at 02/11/2020  1:11 PM EST ----- Triglycerides a little better but still elevated. HDL worse. Change Atorvastatin to Rosuvastatin 10 mg qd #90 & 3 RF and add Co-Q 10 100 mg qd (OTC). Recheck as planned in January. 

## 2020-02-11 NOTE — Telephone Encounter (Signed)
Copied from CRM [id number]. Topic: General - Other >> Feb 11, 2020  2:52 PM Lyn Hollingshead D wrote: PT returning call for his labs results / please advise

## 2020-02-11 NOTE — Telephone Encounter (Signed)
This encounter was created in error - please disregard.

## 2020-02-11 NOTE — Telephone Encounter (Signed)
-----   Message from Tamsen Roers, PA-C sent at 02/11/2020  1:11 PM EST ----- Triglycerides a little better but still elevated. HDL worse. Change Atorvastatin to Rosuvastatin 10 mg qd #90 & 3 RF and add Co-Q 10 100 mg qd (OTC). Recheck as planned in January.

## 2020-02-18 ENCOUNTER — Other Ambulatory Visit: Payer: Self-pay

## 2020-02-18 ENCOUNTER — Encounter: Payer: Self-pay | Admitting: Family Medicine

## 2020-02-18 ENCOUNTER — Ambulatory Visit (INDEPENDENT_AMBULATORY_CARE_PROVIDER_SITE_OTHER): Payer: Medicare Other | Admitting: Family Medicine

## 2020-02-18 VITALS — BP 109/65 | HR 60 | Temp 98.4°F | Wt 213.0 lb

## 2020-02-18 DIAGNOSIS — I10 Essential (primary) hypertension: Secondary | ICD-10-CM | POA: Diagnosis not present

## 2020-02-18 DIAGNOSIS — Z0000 Encounter for general adult medical examination without abnormal findings: Secondary | ICD-10-CM

## 2020-02-18 DIAGNOSIS — I714 Abdominal aortic aneurysm, without rupture, unspecified: Secondary | ICD-10-CM

## 2020-02-18 DIAGNOSIS — E782 Mixed hyperlipidemia: Secondary | ICD-10-CM | POA: Diagnosis not present

## 2020-02-18 DIAGNOSIS — I739 Peripheral vascular disease, unspecified: Secondary | ICD-10-CM | POA: Diagnosis not present

## 2020-02-18 NOTE — Progress Notes (Signed)
Established patient visit   Patient: [name] [name]   DOB: 30-Sep-1945   75 y.o. Male  MRN: [id number] Visit Date: 02/18/2020  Today's healthcare provider: Vernie Murders, PA-C   No chief complaint on file.  Subjective    HPI  The patient is a 75 year old male who presents for follow up to his Wellness exam done by the Nurse Health Advisor on 02/08/20.  Patient reports eating well, sleeping well and has no complaints.   Past Medical History:  Diagnosis Date   Abdominal aortic aneurysm ([place])    Chronic kidney disease    Right kidney stone   Coronary artery disease    Detached retina    Hyperlipidemia    Hypertension    Myocardial infarction [facility])    2007 heart stent   Popliteal aneurysm [facility])    Past Surgical History:  Procedure Laterality Date   ABDOMINAL AORTIC ANEURYSM REPAIR  11/24/09   EVAR   ABDOMINAL AORTOGRAM W/LOWER EXTREMITY N/A 10/22/2017   Procedure: ABDOMINAL AORTOGRAM W/LOWER EXTREMITY;  Surgeon: Serafina Mitchell, MD;  Location: [place] CV LAB;  Service: Cardiovascular;  Laterality: N/A;   AMPUTATION Left 12/10/2013   Procedure: AMPUTATION LEFT  FOURTH AND FIFTH TOES;  Surgeon: Serafina Mitchell, MD;  Location: [facility] OR;  Service: Vascular;  Laterality: Left;   COLONOSCOPY     coronary stenting     2007   EYE SURGERY Right    cataract removed   FEMORAL-POPLITEAL BYPASS GRAFT Left 10/29/2013   Procedure: BYPASS GRAFT FEMORAL-POPLITEAL ARTERY;  Surgeon: Serafina Mitchell, MD;  Location: [facility] OR;  Service: Vascular;  Laterality: Left;  Left Femoral to Posterior Tibial artery bypass graft using nonreversed saphenous vein. and thrombectomy of Tibial arterly.   FEMORAL-TIBIAL BYPASS GRAFT Left 10/30/2013   Procedure: Reexploration of Femoral-Posterior Tibial Bypass; Thrombectomy of Bypass; Left Leg Angiogram; Posterior Tibial Vein Patch Angioplasty;  Surgeon: Serafina Mitchell, MD;  Location: [place];  Service: Vascular;  Laterality: Left;   LOWER EXTREMITY  ANGIOGRAM N/A 05/25/2014   Procedure: LOWER EXTREMITY ANGIOGRAM;  Surgeon: Serafina Mitchell, MD;  Location: [facility] CATH LAB;  Service: Cardiovascular;  Laterality: N/A;   PERIPHERAL VASCULAR BALLOON ANGIOPLASTY Left 10/22/2017   Procedure: PERIPHERAL VASCULAR BALLOON ANGIOPLASTY;  Surgeon: Serafina Mitchell, MD;  Location: [place] CV LAB;  Service: Cardiovascular;  Laterality: Left;  fem pop bypass   PERIPHERAL VASCULAR CATHETERIZATION Left 10/04/2015   Procedure: Lower Extremity Angiography;  Surgeon: Serafina Mitchell, MD;  Location: [place] CV LAB;  Service: Cardiovascular;  Laterality: Left;   PERIPHERAL VASCULAR CATHETERIZATION Left 10/04/2015   Procedure: Peripheral Vascular Intervention;  Surgeon: Serafina Mitchell, MD;  Location: [place] CV LAB;  Service: Cardiovascular;  Laterality: Left;   POPLITEAL ARTERY STENT  11/2009   left popliteal artery    RETINAL DETACHMENT SURGERY Right 2011   TOOTH EXTRACTION  June 2015   Wisdom Tooth Extraction   Family History  Problem Relation Age of Onset   Aneurysm Sister        possibly    Peripheral vascular disease Brother    Allergies  Allergen Reactions   Omnipaque [Iohexol] Rash and Other (See Comments)    Pt requires full premeds and needs to bring a driver.  Broke out in rash on back and arms.  Ok w/ premeds   Oxytetracycline Rash and Other (See Comments)    Terramycin causes rash   Alteplase Other (See Comments)  Internal bleeding, per RN in short stay. Patient requested this to be added to his allergy list   Medications: Outpatient Medications Prior to Visit  Medication Sig   Aspirin Buf,CaCarb-MgCarb-MgO, 81 MG TABS Take 1 tablet by mouth daily.   atorvastatin (LIPITOR) 40 MG tablet TAKE 1 TABLET BY MOUTH EVERY DAY   Coenzyme Q10 (CO Q-10) 100 MG CAPS Take 100 capsules by mouth daily.   metoprolol tartrate (LOPRESSOR) 25 MG tablet TAKE 1 TABLET BY MOUTH TWICE A DAY   Multiple Vitamin (MULTIVITAMIN WITH MINERALS) TABS tablet  Take 1 tablet by mouth daily.   Omega-3 Fatty Acids (FISH OIL TRIPLE STRENGTH) 1400 MG CAPS Take 2,800 mg by mouth 2 (two) times daily.   Saw Palmetto 450 MG CAPS Take 900 mg by mouth 2 (two) times daily.   warfarin (COUMADIN) 5 MG tablet TAKE 1 AND 1/2 TABLETS BY MOUTH ONCE DAILY EXCEPT TAKE 2 TABLETS ON MONDAY (Patient taking differently: Take 7 mg by mouth one time only at 4 PM.)   No facility-administered medications prior to visit.   Social History   Tobacco Use   Smoking status: Former Smoker    Years: 15.00    Types: Cigarettes    Quit date: 09/21/1985    Years since quitting: 34.4   Smokeless tobacco: Current User    Types: Chew   Tobacco comment: once a day chews tobacco  Vaping Use   Vaping Use: Never used  Substance Use Topics   Alcohol use: Yes    Alcohol/week: 0.0 - 1.0 standard drinks    Comment: 1 beer ocassionally (mostly in summer)   Drug use: No    Review of Systems  Constitutional: Negative.   HENT: Negative.   Eyes: Negative.   Respiratory: Negative.   Cardiovascular: Negative.   Gastrointestinal: Negative.   Endocrine: Negative.   Genitourinary: Negative.   Musculoskeletal: Negative.   Skin: Negative.   Allergic/Immunologic: Negative.   Neurological: Negative.   Hematological: Negative.   Psychiatric/Behavioral: Negative.       Objective    BP 109/65 (BP Location: Right Arm, Patient Position: Sitting, Cuff Size: Normal)   Pulse 60   Temp 98.4 F (36.9 C) (Oral)   Wt 213 lb (96.6 kg)   SpO2 97%   BMI 27.35 kg/m  BP Readings from Last 3 Encounters:  02/18/20 109/65  05/18/19 109/67  03/11/19 102/64   Wt Readings from Last 3 Encounters:  02/18/20 213 lb (96.6 kg)  05/18/19 207 lb (93.9 kg)  03/11/19 207 lb (93.9 kg)     Physical Exam Constitutional:      Appearance: Normal appearance. He is normal weight.  HENT:     Head: Normocephalic and atraumatic.     Right Ear: Tympanic membrane, ear canal and external ear normal.     Left  Ear: Tympanic membrane, ear canal and external ear normal.     Nose: Nose normal.     Mouth/Throat:     Mouth: Mucous membranes are moist.     Pharynx: Oropharynx is clear.  Eyes:     Extraocular Movements: Extraocular movements intact.     Conjunctiva/sclera: Conjunctivae normal.     Pupils: Pupils are equal, round, and reactive to light.  Cardiovascular:     Rate and Rhythm: Normal rate and regular rhythm.     Pulses: Normal pulses.     Heart sounds: Normal heart sounds.  Pulmonary:     Effort: Pulmonary effort is normal.     Breath sounds:  Normal breath sounds.  Abdominal:     General: Abdomen is flat. Bowel sounds are normal.     Palpations: Abdomen is soft.  Musculoskeletal:        General: Normal range of motion.     Cervical back: Normal range of motion and neck supple.     Comments:  Well healed amputation of the left 4th and 5th toes (2015 due to infection from arterial occlusive disease).   Skin:    General: Skin is warm and dry.  Neurological:     General: No focal deficit present.     Mental Status: He is alert and oriented to person, place, and time. Mental status is at baseline.  Psychiatric:        Mood and Affect: Mood normal.        Behavior: Behavior normal.        Thought Content: Thought content normal.        Judgment: Judgment normal.    No results found for any visits on 02/18/20.  Assessment & Plan     1. Encounter for Medicare annual wellness exam General health stable and feeling well. Counseled regarding health maintenance.  2. Mixed hyperlipidemia Lab report from 02-10-20 showed some improvement in triglycerides but still elevated over 200 with worsening HDL. Changed Atorvastatin to Crestor 10 mg qd. Continue this regimen with Omega-3 and Co-Q 10 qd.  3. Essential hypertension BP well controlled and GFR 58 on 02-10-20. Still on Metoprolol Tartrate 25 mg BID.  4. PAD (peripheral artery disease) (HCC) Protime 2.7 and will continue Coumadin at  7.5 mg qd. Recheck level in 1 month. Continue follow up with Dr. Myra Gianotti (vascular surgeon) as planned to follow up endovascular abdominal aortic aneurysm repair done October 2011 and left popliteal artery aneurysm with bypass graft stenosis requiring angioplasty 2016 and 2019.  5. Abdominal aortic aneurysm (AAA) without rupture (HCC) Followed by vascular surgeon (Dr. Myra Gianotti) and ultrasound aortic diameter was 3.88. To recheck study in April. No pain.   No follow-ups on file.      I, Worthington Cruzan, PA-C, have reviewed all documentation for this visit. The documentation on 02/18/20 for the exam, diagnosis, procedures, and orders are all accurate and complete.    Dortha Kern, PA-C  [facility] [phone] (phone) [phone] (fax)  [facility]

## 2020-02-24 ENCOUNTER — Other Ambulatory Visit: Payer: Self-pay | Admitting: Family Medicine

## 2020-03-09 ENCOUNTER — Ambulatory Visit (INDEPENDENT_AMBULATORY_CARE_PROVIDER_SITE_OTHER): Payer: Medicare Other

## 2020-03-09 ENCOUNTER — Other Ambulatory Visit: Payer: Self-pay

## 2020-03-09 DIAGNOSIS — I739 Peripheral vascular disease, unspecified: Secondary | ICD-10-CM | POA: Diagnosis not present

## 2020-03-09 LAB — POCT INR
INR: 2.7 (ref 2.0–3.0)
PT: 32.4

## 2020-03-09 NOTE — Patient Instructions (Signed)
Description   7.5 mg daily. F/U in 4 weeks

## 2020-03-10 NOTE — Progress Notes (Signed)
Cardiology Office Note:    Date:  03/11/2020   ID:  [name] [name], DOB 06-08-1945, MRN [id number]  PCP:  Tamsen Roers, PA-C  [facility] Cardiologist:  Tonny Bollman, MD   [facility] Electrophysiologist:  None   Referring MD: Tamsen Roers, PA-C   Chief Complaint:  Follow-up (CAD)    Patient Profile:    [name] [name] is a 75 y.o. male with:   Coronary artery disease   S/p inferior STEMI in 2007 >>PCI: BMS to RCA ([facility])  Abdominal aortic aneurysm, L pop aneurysm - s/p EVAR in 2011  Peripheral arterial disease  On chronic anticoagulation w warfarin    Hx of popliteal artery stent in 2011  S/p amputation of L 4&5 toes  S/p L fem-pop bypass in 2015  S/p angioplasty of graft in 9/19  Hypertension   Hyperlipidemia   Hx of retinal detachment   Prior CV studies:   Echocardiogram 10/26/2013 Moderate LVH, EF 55-60, no RWMA, GR 1 DD  LHC (09/2005):   insig CAD in CFX and LAD, RCA occluded, EF 59% => BMS to RCA   History of Present Illness:    [name] [name] was last seen by Dr. Excell Seltzer in 1/21.  He returns for follow-up.  He is here alone.  He is doing well without chest pain, shortness of breath, syncope, leg edema.  His PCP follows his warfarin.        Past Medical History:  Diagnosis Date  . Abdominal aortic aneurysm (HCC)   . Chronic kidney disease    Right kidney stone  . Coronary artery disease   . Detached retina   . Hyperlipidemia   . Hypertension   . Myocardial infarction [facility])    2007 heart stent  . Popliteal aneurysm (HCC)     Current Medications: Current Meds  Medication Sig  . Aspirin Buf,CaCarb-MgCarb-MgO, 81 MG TABS Take 1 tablet by mouth daily.  . Coenzyme Q10 (CO Q-10) 100 MG CAPS Take 100 capsules by mouth daily.  . metoprolol tartrate (LOPRESSOR) 25 MG tablet TAKE 1 TABLET BY MOUTH TWICE A DAY  . Multiple Vitamin (MULTIVITAMIN WITH MINERALS) TABS tablet Take 1 tablet by mouth daily.  . Omega-3 Fatty Acids  (FISH OIL TRIPLE STRENGTH) 1400 MG CAPS Take 2,800 mg by mouth 2 (two) times daily.  . rosuvastatin (CRESTOR) 20 MG tablet Take 1 tablet (20 mg total) by mouth daily.  . Saw Palmetto 450 MG CAPS Take 900 mg by mouth 2 (two) times daily.  Marland Kitchen warfarin (COUMADIN) 5 MG tablet TAKE 1 AND 1/2 TABLETS BY MOUTH ONCE DAILY EXCEPT TAKE 2 TABLETS ON MONDAY  . [DISCONTINUED] atorvastatin (LIPITOR) 40 MG tablet TAKE 1 TABLET BY MOUTH EVERY DAY     Allergies:   Omnipaque [iohexol], Oxytetracycline, and Alteplase   Social History   Tobacco Use  . Smoking status: Former Smoker    Years: 15.00    Types: Cigarettes    Quit date: 09/21/1985    Years since quitting: 34.4  . Smokeless tobacco: Current User    Types: Chew  . Tobacco comment: once a day chews tobacco  Vaping Use  . Vaping Use: Never used  Substance Use Topics  . Alcohol use: Yes    Alcohol/week: 0.0 - 1.0 standard drinks    Comment: 1 beer ocassionally (mostly in summer)  . Drug use: No     Family Hx: The patient's family history includes Aneurysm in his sister; Peripheral vascular disease in his brother.  ROS   EKGs/Labs/Other Test Reviewed:    EKG:  EKG is   ordered today.  The ekg ordered today demonstrates normal sinus rhythm, HR 64, inf Q waves, no ST-TW changes, QTc 377 ms, no change from prior tracing.   Recent Labs: 02/10/2020: ALT 32; BUN 11; Creatinine, Ser 1.21; Hemoglobin 16.1; Platelets 240; Potassium 4.4; Sodium 141; TSH 5.040   Recent Lipid Panel Lab Results  Component Value Date/Time   CHOL 123 02/10/2020 09:43 AM   TRIG 249 (H) 02/10/2020 09:43 AM   HDL 26 (L) 02/10/2020 09:43 AM   CHOLHDL 4.7 02/10/2020 09:43 AM   CHOLHDL 4.0 01/25/2017 11:29 AM   LDLCALC 57 02/10/2020 09:43 AM   LDLCALC 73 01/25/2017 11:29 AM      Risk Assessment/Calculations:      Physical Exam:    VS:  BP 130/60   Pulse 64   Ht 6\' 2"  (1.88 m)   Wt 213 lb (96.6 kg)   SpO2 95%   BMI 27.35 kg/m     Wt Readings from Last 3  Encounters:  03/11/20 213 lb (96.6 kg)  02/18/20 213 lb (96.6 kg)  05/18/19 207 lb (93.9 kg)     Constitutional:      Appearance: Healthy appearance. Not in distress.  Neck:     Vascular: No JVR. JVD normal.  Pulmonary:     Effort: Pulmonary effort is normal.     Breath sounds: No wheezing. No rales.  Cardiovascular:     Normal rate. Regular rhythm. Normal S1. Normal S2.     Murmurs: There is no murmur.  Edema:    Peripheral edema absent.  Abdominal:     Palpations: Abdomen is soft. There is no hepatomegaly.  Skin:    General: Skin is warm and dry.  Neurological:     General: No focal deficit present.     Mental Status: Alert and oriented to person, place and time.     Cranial Nerves: Cranial nerves are intact.       ASSESSMENT & PLAN:    1. Coronary artery disease involving native coronary artery of native heart without angina pectoris S/p inferior STEMI in 2007 tx with BMS to the RCA.  He is not having angina.  Continue ASA, statin, beta-blocker.    2. PAD (peripheral artery disease) (HCC) S/p prior revasc surgery. He is followed by [facility] (Dr. 2008).  He is on chronic anticoagulation with warfarin.    3. AAA (abdominal aortic aneurysm) without rupture (HCC) S/p EVAR.  He is followed by vascular surgery.   4. Essential hypertension BP borderline today.  But, it is usually optimal. Continue current Rx.   5. Mixed hyperlipidemia Recent Lipid panel with optimal LDL.  Trigs are high.  His PCP suggested changing to rosuvasatin to get his triglycerides downs and HDL up.  He tells me his Trigs used to be > 1K.  So, they are much improved.  We discussed changing to rosuvastatin vs adding Vascepa in place of his fish oil.  He had trouble affording Lovaza in the past.  I recommend changing to rosuvastatin first.  If his Trigs remain > 150, we could try to get him on Vascepa if it is affordable.    -DC Atrovastatin  -Start Rosuvastatin 20 mg once daily  -Lipids, LFTs in 3 mos.        Dispo:  Return in about 1 year (around 03/11/2021) for Routine Follow Up, w/ Dr. 03/13/2021, in person.   Medication Adjustments/Labs and  Tests Ordered: Current medicines are reviewed at length with the patient today.  Concerns regarding medicines are outlined above.  Tests Ordered: Orders Placed This Encounter  Procedures  . Hepatic function panel  . Lipid panel  . EKG 12-Lead   Medication Changes: Meds ordered this encounter  Medications  . rosuvastatin (CRESTOR) 20 MG tablet    Sig: Take 1 tablet (20 mg total) by mouth daily.    Dispense:  90 tablet    Refill:  3    Signed, Tereso Newcomer, PA-C  03/11/2020 11:23 AM    [facility] [address] [place], [place], [place]  [address] Phone: [phone]; Fax: [phone]

## 2020-03-11 ENCOUNTER — Other Ambulatory Visit: Payer: Self-pay

## 2020-03-11 ENCOUNTER — Ambulatory Visit (INDEPENDENT_AMBULATORY_CARE_PROVIDER_SITE_OTHER): Payer: Medicare Other | Admitting: Physician Assistant

## 2020-03-11 ENCOUNTER — Encounter: Payer: Self-pay | Admitting: Physician Assistant

## 2020-03-11 VITALS — BP 130/60 | HR 64 | Ht 74.0 in | Wt 213.0 lb

## 2020-03-11 DIAGNOSIS — I714 Abdominal aortic aneurysm, without rupture, unspecified: Secondary | ICD-10-CM

## 2020-03-11 DIAGNOSIS — I2510 Atherosclerotic heart disease of native coronary artery without angina pectoris: Secondary | ICD-10-CM

## 2020-03-11 DIAGNOSIS — E782 Mixed hyperlipidemia: Secondary | ICD-10-CM

## 2020-03-11 DIAGNOSIS — I739 Peripheral vascular disease, unspecified: Secondary | ICD-10-CM

## 2020-03-11 DIAGNOSIS — I10 Essential (primary) hypertension: Secondary | ICD-10-CM | POA: Diagnosis not present

## 2020-03-11 MED ORDER — ROSUVASTATIN CALCIUM 20 MG PO TABS: 20.0000 mg | ORAL_TABLET | Freq: Every day | ORAL | 3 refills | Status: DC

## 2020-03-11 NOTE — Patient Instructions (Signed)
Medication Instructions:  Your physician has recommended you make the following change in your medication:   1) Stop Atorvastatin  2) Start Rosuvastatin 20 mg, 1 tablet by mouth once a day  *If you need a refill on your cardiac medications before your next appointment, please call your pharmacy*  Lab Work: Your physician recommends that you return for lab work in: 3 months, come fasting to this appointment  Testing/Procedures: None ordered today  Follow-Up: At [facility], you and your health needs are our priority.  As part of our continuing mission to provide you with exceptional heart care, we have created designated Provider Care Teams.  These Care Teams include your primary Cardiologist (physician) and Advanced Practice Providers (APPs -  Physician Assistants and Nurse Practitioners) who all work together to provide you with the care you need, when you need it.  Your next appointment:   12 month(s)  The format for your next appointment:   In Person  Provider:   Tonny Bollman, MD

## 2020-03-14 ENCOUNTER — Encounter: Payer: Self-pay | Admitting: Surgery

## 2020-03-14 ENCOUNTER — Other Ambulatory Visit (HOSPITAL_COMMUNITY): Payer: Self-pay | Admitting: Surgery

## 2020-03-14 ENCOUNTER — Ambulatory Visit (INDEPENDENT_AMBULATORY_CARE_PROVIDER_SITE_OTHER)
Admission: RE | Admit: 2020-03-14 | Discharge: 2020-03-14 | Disposition: A | Discharge status: Home / Self Care | Payer: Medicare Other | Source: Ambulatory Visit | Attending: Family Medicine | Admitting: Family Medicine

## 2020-03-14 ENCOUNTER — Ambulatory Visit (INDEPENDENT_AMBULATORY_CARE_PROVIDER_SITE_OTHER): Payer: Medicare Other | Admitting: Surgery

## 2020-03-14 ENCOUNTER — Ambulatory Visit (HOSPITAL_COMMUNITY)
Admission: RE | Admit: 2020-03-14 | Discharge: 2020-03-14 | Disposition: A | Discharge status: Home / Self Care | Payer: Medicare Other | Source: Ambulatory Visit | Attending: Surgery | Admitting: Surgery

## 2020-03-14 ENCOUNTER — Ambulatory Visit (INDEPENDENT_AMBULATORY_CARE_PROVIDER_SITE_OTHER)
Admission: RE | Admit: 2020-03-14 | Discharge: 2020-03-14 | Disposition: A | Discharge status: Home / Self Care | Payer: Medicare Other | Source: Ambulatory Visit | Attending: Surgery | Admitting: Surgery

## 2020-03-14 ENCOUNTER — Other Ambulatory Visit: Payer: Self-pay

## 2020-03-14 VITALS — BP 123/76 | HR 57 | Temp 98.0°F | Resp 20 | Ht 74.0 in | Wt 208.0 lb

## 2020-03-14 DIAGNOSIS — I714 Abdominal aortic aneurysm, without rupture, unspecified: Secondary | ICD-10-CM

## 2020-03-14 DIAGNOSIS — I2510 Atherosclerotic heart disease of native coronary artery without angina pectoris: Secondary | ICD-10-CM | POA: Diagnosis not present

## 2020-03-14 DIAGNOSIS — I739 Peripheral vascular disease, unspecified: Secondary | ICD-10-CM | POA: Diagnosis not present

## 2020-03-14 DIAGNOSIS — I724 Aneurysm of artery of lower extremity: Secondary | ICD-10-CM

## 2020-03-14 NOTE — Progress Notes (Signed)
[facility] [place]  Patient name: [name] [name] MRN: [id number] DOB: Jul 05, 1945 Sex: male   REASON FOR VISIT:    Follow-up  [name]:    [name] [name] is a 75 y.o. male who is back today for follow-up.  He has undergone the following procedures:   -October 2011: Endovascular aneurysm repair, with known type II endoleak  -October 2011: Endovascular repair of left popliteal aneurysm  -October 29, 2013: Femoral posterior tibial bypass graft: This was performed after failed attempted lytic therapy for occluded popliteal stents.  The bypass graft occluded early requiring reexploration.  The posterior tibial artery was opened behind the ankle and Fogarty catheters were able to be passed around the pedal arch.  -05/25/2014: Drug-coated balloon angioplasty to the distal anastomosis of his left leg bypass  -10/22/2017: Drug-coated balloon angioplasty, left distal anastomosis of his left leg bypass The patient continues to take a statin for hypercholesterolemia.  He is on Coumadin. He feels like he is getting more feeling back in his left foot   PAST MEDICAL HISTORY:   Past Medical History:  Diagnosis Date  . Abdominal aortic aneurysm (HCC)   . Chronic kidney disease    Right kidney stone  . Coronary artery disease   . Detached retina   . Hyperlipidemia   . Hypertension   . Myocardial infarction [facility])    2007 heart stent  . Popliteal aneurysm (HCC)      FAMILY HISTORY:   Family History  Problem Relation Age of Onset  . Aneurysm Sister        possibly   . Peripheral vascular disease Brother     SOCIAL HISTORY:   Social History   Tobacco Use  . Smoking status: Former Smoker    Years: 15.00    Types: Cigarettes    Quit date: 09/21/1985    Years since quitting: 34.5  . Smokeless tobacco: Current User    Types: Chew  . Tobacco comment: once a day chews tobacco  Substance Use Topics   . Alcohol use: Yes    Alcohol/week: 0.0 - 1.0 standard drinks    Comment: 1 beer ocassionally (mostly in summer)     ALLERGIES:   Allergies  Allergen Reactions  . Omnipaque [Iohexol] Rash and Other (See Comments)    Pt requires full premeds and needs to bring a driver.  Broke out in rash on back and arms.  Ok w/ premeds  . Oxytetracycline Rash and Other (See Comments)    Terramycin causes rash  . Alteplase Other (See Comments)    Internal bleeding, per RN in short stay. Patient requested this to be added to his allergy list     CURRENT MEDICATIONS:   Current Outpatient Medications  Medication Sig Dispense Refill  . Aspirin Buf,CaCarb-MgCarb-MgO, 81 MG TABS Take 1 tablet by mouth daily.    . Coenzyme Q10 (CO Q-10) 100 MG CAPS Take 100 capsules by mouth daily.  0  . metoprolol tartrate (LOPRESSOR) 25 MG tablet TAKE 1 TABLET BY MOUTH TWICE A DAY 180 tablet 3  . Multiple Vitamin (MULTIVITAMIN WITH MINERALS) TABS tablet Take 1 tablet by mouth daily.    . Omega-3 Fatty Acids (FISH OIL TRIPLE STRENGTH) 1400 MG CAPS Take 2,800 mg by mouth 2 (two) times daily.    . rosuvastatin (CRESTOR) 20 MG tablet Take 1 tablet (20 mg total) by mouth daily. 90 tablet 3  . Saw Palmetto 450 MG CAPS Take 900 mg by  mouth 2 (two) times daily.    Marland Kitchen warfarin (COUMADIN) 5 MG tablet TAKE 1 AND 1/2 TABLETS BY MOUTH ONCE DAILY EXCEPT TAKE 2 TABLETS ON MONDAY 143 tablet 4   No current facility-administered medications for this visit.    REVIEW OF SYSTEMS:   [X]  denotes positive finding, [ ]  denotes negative finding Cardiac  Comments:  Chest pain or chest pressure:    Shortness of breath upon exertion:    Short of breath when lying flat:    Irregular heart rhythm:        Vascular    Pain in calf, thigh, or hip brought on by ambulation:    Pain in feet at night that wakes you up from your sleep:     Blood clot in your veins:    Leg swelling:         Pulmonary    Oxygen at home:    Productive cough:      Wheezing:         Neurologic    Sudden weakness in arms or legs:     Sudden numbness in arms or legs:     Sudden onset of difficulty speaking or slurred speech:    Temporary loss of vision in one eye:     Problems with dizziness:         Gastrointestinal    Blood in stool:     Vomited blood:         Genitourinary    Burning when urinating:     Blood in urine:        Psychiatric    Major depression:         Hematologic    Bleeding problems:    Problems with blood clotting too easily:        Skin    Rashes or ulcers:        Constitutional    Fever or chills:      PHYSICAL EXAM:   Vitals:   03/14/20 0853  BP: 123/76  Pulse: (!) 57  Resp: 20  Temp: 98 F (36.7 C)  SpO2: 95%  Weight: 208 lb (94.3 kg)  Height: 6\' 2"  (1.88 m)    GENERAL: The patient is a well-nourished male, in no acute distress. The vital signs are documented above. CARDIAC: There is a regular rate and rhythm.  VASCULAR: Nonpalpable pedal pulse on the left PULMONARY: Non-labored respirations MUSCULOSKELETAL: There are no major deformities or cyanosis. NEUROLOGIC: No focal weakness or paresthesias are detected. SKIN: There are no ulcers or rashes noted. PSYCHIATRIC: The patient has a normal affect.  STUDIES:   I have reviewed the following vascular studies: +-------+-----------+-----------+------------+------------+  ABI/TBIToday's ABIToday's TBIPrevious ABIPrevious TBI  +-------+-----------+-----------+------------+------------+  Right 0.84    0.39    0.95    0.41      +-------+-----------+-----------+------------+------------+  Left  0.63    0.28    0.71    0        +-------+-----------+-----------+------------+------------+  Left: Total occlusion noted in the posterior tibial artery. Patent left  fem- posterior tibial artery bypass graft.  AAA:  Abdominal Aorta: Patent endovascular aneurysm repair with no evidence of  endoleak.    MEDICAL ISSUES:   AAA: Maximum aortic diameter is 3 cm.  No endoleak was visualized.  I will repeat his ultrasound in 1 year  Left leg bypass: No stenosis was seen on his bypass graft today.  He has known tibial vessel occlusion.  His bypass stays patent via collaterals.  He remains on Coumadin.  This will be reimaged and 1 year  Right leg: No significant change in his popliteal aneurysm.  This will be further evaluated in 1 year with a repeat ultrasound    Durene Cal, IV, MD, FACS [facility] [phone] Pager [phone]

## 2020-04-06 ENCOUNTER — Ambulatory Visit (INDEPENDENT_AMBULATORY_CARE_PROVIDER_SITE_OTHER): Payer: Medicare Other

## 2020-04-06 ENCOUNTER — Other Ambulatory Visit: Payer: Self-pay

## 2020-04-06 DIAGNOSIS — I2510 Atherosclerotic heart disease of native coronary artery without angina pectoris: Secondary | ICD-10-CM | POA: Diagnosis not present

## 2020-04-06 DIAGNOSIS — I739 Peripheral vascular disease, unspecified: Secondary | ICD-10-CM

## 2020-04-06 LAB — POCT INR
INR: 3.6 — AB (ref 2.0–3.0)
INR: 3.6 — AB (ref 2.0–3.0)
PT: 43.1
PT: 43.1

## 2020-04-06 NOTE — Patient Instructions (Signed)
Description   Hold for 2 days then resume 7.5 mg daily

## 2020-04-27 ENCOUNTER — Ambulatory Visit (INDEPENDENT_AMBULATORY_CARE_PROVIDER_SITE_OTHER): Payer: Medicare Other

## 2020-04-27 ENCOUNTER — Other Ambulatory Visit: Payer: Self-pay

## 2020-04-27 DIAGNOSIS — I739 Peripheral vascular disease, unspecified: Secondary | ICD-10-CM | POA: Diagnosis not present

## 2020-04-27 DIAGNOSIS — I2510 Atherosclerotic heart disease of native coronary artery without angina pectoris: Secondary | ICD-10-CM

## 2020-04-27 LAB — POCT INR
INR: 3.3 — AB (ref 2.0–3.0)
PT: 39.6

## 2020-04-27 NOTE — Patient Instructions (Signed)
Continue 7.5mg  daily.  Recheck in four weeks.

## 2020-05-25 ENCOUNTER — Other Ambulatory Visit: Payer: Self-pay

## 2020-05-25 ENCOUNTER — Ambulatory Visit (INDEPENDENT_AMBULATORY_CARE_PROVIDER_SITE_OTHER): Payer: Medicare Other | Admitting: Adult Health

## 2020-05-25 DIAGNOSIS — I739 Peripheral vascular disease, unspecified: Secondary | ICD-10-CM | POA: Diagnosis not present

## 2020-05-25 DIAGNOSIS — I2510 Atherosclerotic heart disease of native coronary artery without angina pectoris: Secondary | ICD-10-CM | POA: Diagnosis not present

## 2020-05-25 LAB — POCT INR
INR: 2.8 (ref 2.0–3.0)
PT: 33.6

## 2020-05-25 NOTE — Patient Instructions (Signed)
Description   7.5 mg daily

## 2020-06-09 ENCOUNTER — Other Ambulatory Visit: Payer: Self-pay | Admitting: Physician Assistant

## 2020-06-09 ENCOUNTER — Other Ambulatory Visit: Payer: Medicare Other

## 2020-06-09 DIAGNOSIS — I739 Peripheral vascular disease, unspecified: Secondary | ICD-10-CM | POA: Diagnosis not present

## 2020-06-09 DIAGNOSIS — I10 Essential (primary) hypertension: Secondary | ICD-10-CM | POA: Diagnosis not present

## 2020-06-09 DIAGNOSIS — E782 Mixed hyperlipidemia: Secondary | ICD-10-CM | POA: Diagnosis not present

## 2020-06-09 DIAGNOSIS — I2510 Atherosclerotic heart disease of native coronary artery without angina pectoris: Secondary | ICD-10-CM | POA: Diagnosis not present

## 2020-06-09 DIAGNOSIS — I714 Abdominal aortic aneurysm, without rupture: Secondary | ICD-10-CM | POA: Diagnosis not present

## 2020-06-10 LAB — HEPATIC FUNCTION PANEL
ALT: 29 IU/L (ref 0–44)
AST: 30 IU/L (ref 0–40)
Albumin: 4.2 g/dL (ref 3.7–4.7)
Alkaline Phosphatase: 74 IU/L (ref 44–121)
Bilirubin Total: 1.9 mg/dL — ABNORMAL HIGH (ref 0.0–1.2)
Bilirubin, Direct: 0.35 mg/dL (ref 0.00–0.40)
Total Protein: 6.9 g/dL (ref 6.0–8.5)

## 2020-06-10 LAB — LIPID PANEL
Chol/HDL Ratio: 5.8 ratio — ABNORMAL HIGH (ref 0.0–5.0)
Cholesterol, Total: 138 mg/dL (ref 100–199)
HDL: 24 mg/dL — ABNORMAL LOW (ref >39)
LDL Chol Calc (NIH): 35 mg/dL (ref 0–99)
Triglycerides: 563 mg/dL (ref 0–149)
VLDL Cholesterol Cal: 79 mg/dL — ABNORMAL HIGH (ref 5–40)

## 2020-06-16 ENCOUNTER — Encounter: Payer: Self-pay | Admitting: *Deleted

## 2020-06-16 ENCOUNTER — Telehealth: Payer: Self-pay

## 2020-06-16 ENCOUNTER — Telehealth: Payer: Self-pay | Admitting: *Deleted

## 2020-06-16 DIAGNOSIS — R945 Abnormal results of liver function studies: Secondary | ICD-10-CM

## 2020-06-16 DIAGNOSIS — I2510 Atherosclerotic heart disease of native coronary artery without angina pectoris: Secondary | ICD-10-CM

## 2020-06-16 DIAGNOSIS — R7989 Other specified abnormal findings of blood chemistry: Secondary | ICD-10-CM

## 2020-06-16 DIAGNOSIS — Z79899 Other long term (current) drug therapy: Secondary | ICD-10-CM

## 2020-06-16 DIAGNOSIS — R17 Unspecified jaundice: Secondary | ICD-10-CM

## 2020-06-16 DIAGNOSIS — E782 Mixed hyperlipidemia: Secondary | ICD-10-CM

## 2020-06-16 MED ORDER — ROSUVASTATIN CALCIUM 10 MG PO TABS: 10.0000 mg | ORAL_TABLET | Freq: Every day | ORAL | 3 refills | Status: DC

## 2020-06-16 MED ORDER — FENOFIBRATE 54 MG PO TABS: 54.0000 mg | ORAL_TABLET | Freq: Every day | ORAL | 1 refills | Status: DC

## 2020-06-16 NOTE — Telephone Encounter (Signed)
Spoke with patient about medication changes and advised will send to his pharmacy listed.  Advised labs will need to be redrawn around June 9 and he requested order be sent to [facility].  Lab will be scheduled.  Diet information to reduce cholesterol will be mailed to patient.

## 2020-06-16 NOTE — Telephone Encounter (Signed)
-----   Message from Beatrice Lecher, [place] sent at 06/16/2020  9:40 AM EDT ----- Start fenofibrate 54 mg once daily for triglycerides Decrease Rosuvastatin to 10 mg once daily (ok to cut 20 mg tab in 1/2) LFTs 4 weeks after med change Lipids and LFTs 8 weeks after med change Send diet info to lower triglycerides  Tereso Newcomer, PA-C    06/16/2020 9:38 AM

## 2020-06-16 NOTE — Telephone Encounter (Signed)
Copied from CRM [id number]. Topic: General - Inquiry >> Jun 16, 2020 10:56 AM Crist Infante wrote: Reason for CRM: pt wants ta copy of the labs done by dr weaver office and wants to pick up today.  Wants dennis to ook over his

## 2020-06-17 ENCOUNTER — Telehealth: Payer: Self-pay | Admitting: Cardiovascular Disease

## 2020-06-17 NOTE — Telephone Encounter (Signed)
Saw the severe increase in triglycerides on the cardiology lab report of lipids. Agree with the change to Rosuvastatin and recheck as they recommended.

## 2020-06-17 NOTE — Telephone Encounter (Signed)
Returned call to patient. He would like a copy of his blood work.  He will have LFTs drawn in [facility] June 1.  He will have fasting labs in [facility] July 1. Those labs will need to be released AFTER June 1 to prevent further lab issues.

## 2020-06-17 NOTE — Telephone Encounter (Signed)
Pt calling in regards to obtaining  results for his most recent labwork. Please advise

## 2020-06-22 ENCOUNTER — Ambulatory Visit (INDEPENDENT_AMBULATORY_CARE_PROVIDER_SITE_OTHER): Payer: Medicare Other

## 2020-06-22 ENCOUNTER — Other Ambulatory Visit: Payer: Self-pay

## 2020-06-22 DIAGNOSIS — I2510 Atherosclerotic heart disease of native coronary artery without angina pectoris: Secondary | ICD-10-CM | POA: Diagnosis not present

## 2020-06-22 DIAGNOSIS — I739 Peripheral vascular disease, unspecified: Secondary | ICD-10-CM | POA: Diagnosis not present

## 2020-06-22 LAB — POCT INR
INR: 3.4 — AB (ref 2.0–3.0)
PT: 41.1

## 2020-06-22 NOTE — Patient Instructions (Signed)
Continue 7.5mg  daily.  Recheck in four weeks.

## 2020-07-07 ENCOUNTER — Telehealth: Payer: Self-pay | Admitting: Physician Assistant

## 2020-07-07 ENCOUNTER — Other Ambulatory Visit: Payer: Self-pay

## 2020-07-07 DIAGNOSIS — I2510 Atherosclerotic heart disease of native coronary artery without angina pectoris: Secondary | ICD-10-CM

## 2020-07-07 DIAGNOSIS — E782 Mixed hyperlipidemia: Secondary | ICD-10-CM

## 2020-07-07 DIAGNOSIS — I10 Essential (primary) hypertension: Secondary | ICD-10-CM

## 2020-07-07 DIAGNOSIS — I714 Abdominal aortic aneurysm, without rupture, unspecified: Secondary | ICD-10-CM

## 2020-07-07 DIAGNOSIS — I739 Peripheral vascular disease, unspecified: Secondary | ICD-10-CM

## 2020-07-07 NOTE — Telephone Encounter (Signed)
New Message:    Pt says he is going to have his lab work at [facility] in [place]. He wants to know if he will need an order?

## 2020-07-07 NOTE — Telephone Encounter (Signed)
Informed the patient that his orders should be available at [facility] when he goes to have his blood drawn.

## 2020-07-13 DIAGNOSIS — I714 Abdominal aortic aneurysm, without rupture: Secondary | ICD-10-CM | POA: Diagnosis not present

## 2020-07-13 DIAGNOSIS — I739 Peripheral vascular disease, unspecified: Secondary | ICD-10-CM | POA: Diagnosis not present

## 2020-07-13 DIAGNOSIS — E782 Mixed hyperlipidemia: Secondary | ICD-10-CM | POA: Diagnosis not present

## 2020-07-13 DIAGNOSIS — I10 Essential (primary) hypertension: Secondary | ICD-10-CM | POA: Diagnosis not present

## 2020-07-13 DIAGNOSIS — I2510 Atherosclerotic heart disease of native coronary artery without angina pectoris: Secondary | ICD-10-CM | POA: Diagnosis not present

## 2020-07-13 LAB — HEPATIC FUNCTION PANEL
ALT: 22 IU/L (ref 0–44)
AST: 29 IU/L (ref 0–40)
Albumin: 4.5 g/dL (ref 3.7–4.7)
Alkaline Phosphatase: 70 IU/L (ref 44–121)
Bilirubin Total: 1.5 mg/dL — ABNORMAL HIGH (ref 0.0–1.2)
Bilirubin, Direct: 0.31 mg/dL (ref 0.00–0.40)
Total Protein: 7 g/dL (ref 6.0–8.5)

## 2020-07-16 ENCOUNTER — Other Ambulatory Visit: Payer: Self-pay | Admitting: Family Medicine

## 2020-07-16 NOTE — Telephone Encounter (Signed)
Requested medication (s) are due for refill today: yes  Requested medication (s) are on the active medication list: yes  Last refill:  07/14/19  Future visit scheduled: no  Notes to clinic:  med not delegated to NT to RF- overdue INR   Requested Prescriptions  Pending Prescriptions Disp Refills   warfarin (COUMADIN) 5 MG tablet [Pharmacy Med Name: WARFARIN SODIUM 5 MG TABLET] 143 tablet 4    Sig: TAKE 1 AND 1/2 TABLETS BY MOUTH ONCE DAILY EXCEPT TAKE 2 TABLETS ON MONDAY      Hematology:  Anticoagulants - warfarin Failed - 07/16/2020  9:52 AM      Failed - This refill cannot be delegated      Failed - If the patient is managed by Coumadin Clinic - route to their Pool. If not, forward to the provider.      Failed - INR in normal range and within 30 days    INR  Date Value Ref Range Status  06/22/2020 3.4 (A) 2.0 - 3.0 Final    Comment:    Continue 7.5mg  daily.  Recheck in four weeks.   10/22/2017 1.14  Final    Comment:    Performed at [facility], [address] [place], [place] [address]          Failed - Valid encounter within last 3 months    Recent Outpatient Visits           1 year ago Hyperlipidemia, unspecified hyperlipidemia type   [facility] [name], Jodell Cipro, PA-C   2 years ago PAD (peripheral artery disease) [facility])   [facility] [name], Jodell Cipro, PA-C   2 years ago PAD (peripheral artery disease) (HCC)   [facility] [name], Jodell Cipro, PA-C   2 years ago Hemoptysis   [facility] [place], [place], [place]   3 years ago 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

## 2020-07-20 ENCOUNTER — Ambulatory Visit (INDEPENDENT_AMBULATORY_CARE_PROVIDER_SITE_OTHER): Payer: Medicare Other | Admitting: *Deleted

## 2020-07-20 DIAGNOSIS — I2510 Atherosclerotic heart disease of native coronary artery without angina pectoris: Secondary | ICD-10-CM | POA: Diagnosis not present

## 2020-07-20 DIAGNOSIS — I739 Peripheral vascular disease, unspecified: Secondary | ICD-10-CM

## 2020-07-20 LAB — POCT INR
INR: 3.7 — AB (ref 2.0–3.0)
PT: 44

## 2020-07-20 NOTE — Patient Instructions (Signed)
Hold dose today and tomorrow. Than resume 7.5mg  daily.  Recheck in three weeks.

## 2020-08-10 ENCOUNTER — Ambulatory Visit (INDEPENDENT_AMBULATORY_CARE_PROVIDER_SITE_OTHER): Payer: Medicare Other

## 2020-08-10 ENCOUNTER — Other Ambulatory Visit: Payer: Self-pay

## 2020-08-10 ENCOUNTER — Telehealth: Payer: Self-pay

## 2020-08-10 DIAGNOSIS — I739 Peripheral vascular disease, unspecified: Secondary | ICD-10-CM | POA: Diagnosis not present

## 2020-08-10 DIAGNOSIS — I2510 Atherosclerotic heart disease of native coronary artery without angina pectoris: Secondary | ICD-10-CM

## 2020-08-10 LAB — POCT INR
INR: 4.1 — AB (ref 2.0–3.0)
PT: 49.1

## 2020-08-10 NOTE — Telephone Encounter (Signed)
Pt. Given message and medication instructions. Verbalizes understanding.

## 2020-08-10 NOTE — Patient Instructions (Addendum)
Hold x 1 day then 5 mg on Fridays and Mondays, 7.5 mg the rest of the days.  Recheck in two weeks.

## 2020-08-10 NOTE — Telephone Encounter (Signed)
Pt was in today for a coumadin check.   His INR was: 4.1       PT: 49.1  Pt is currently taking 7.5mg  daily.    Per Dr. Beryle Flock:  Mr. Capri it to hold is coumadin for one day then start back 5 mg on Fridays and Mondays, 7.5 mg the rest of the days.  Recheck in two weeks.  (This is already scheduled for 08/24/2020)  I left a message on pt's answering machine to call back so we can give him the instructions.     PEC please advise pt new coumadin instructions when he calls back.   Thanks,   -Vernona Rieger

## 2020-08-10 NOTE — Progress Notes (Signed)
Thanks, agree with this plan.

## 2020-08-12 DIAGNOSIS — E782 Mixed hyperlipidemia: Secondary | ICD-10-CM | POA: Diagnosis not present

## 2020-08-12 DIAGNOSIS — I10 Essential (primary) hypertension: Secondary | ICD-10-CM | POA: Diagnosis not present

## 2020-08-12 DIAGNOSIS — I739 Peripheral vascular disease, unspecified: Secondary | ICD-10-CM | POA: Diagnosis not present

## 2020-08-12 DIAGNOSIS — I714 Abdominal aortic aneurysm, without rupture: Secondary | ICD-10-CM | POA: Diagnosis not present

## 2020-08-12 DIAGNOSIS — I2510 Atherosclerotic heart disease of native coronary artery without angina pectoris: Secondary | ICD-10-CM | POA: Diagnosis not present

## 2020-08-12 LAB — LIPID PANEL
Chol/HDL Ratio: 5 ratio (ref 0.0–5.0)
Cholesterol, Total: 134 mg/dL (ref 100–199)
HDL: 27 mg/dL — ABNORMAL LOW (ref >39)
LDL Chol Calc (NIH): 59 mg/dL (ref 0–99)
Triglycerides: 306 mg/dL — ABNORMAL HIGH (ref 0–149)
VLDL Cholesterol Cal: 48 mg/dL — ABNORMAL HIGH (ref 5–40)

## 2020-08-16 ENCOUNTER — Other Ambulatory Visit: Payer: Self-pay | Admitting: *Deleted

## 2020-08-16 ENCOUNTER — Telehealth: Payer: Self-pay | Admitting: *Deleted

## 2020-08-16 DIAGNOSIS — Z79899 Other long term (current) drug therapy: Secondary | ICD-10-CM

## 2020-08-16 NOTE — Telephone Encounter (Signed)
-----   Message from Beatrice Lecher, [place] sent at 08/14/2020  9:59 PM EDT ----- LFTs were not done (as best as I can tell).  Can we check on that and schedule LFTs if they were not done? Tereso Newcomer, PA-C    08/14/2020 9:59 PM

## 2020-08-17 LAB — HEPATIC FUNCTION PANEL
ALT: 22 IU/L (ref 0–44)
AST: 25 IU/L (ref 0–40)
Albumin: 4.4 g/dL (ref 3.7–4.7)
Alkaline Phosphatase: 87 IU/L (ref 44–121)
Bilirubin Total: 1.1 mg/dL (ref 0.0–1.2)
Bilirubin, Direct: 0.29 mg/dL (ref 0.00–0.40)
Total Protein: 6.6 g/dL (ref 6.0–8.5)

## 2020-08-24 ENCOUNTER — Ambulatory Visit (INDEPENDENT_AMBULATORY_CARE_PROVIDER_SITE_OTHER): Payer: Medicare Other

## 2020-08-24 ENCOUNTER — Other Ambulatory Visit: Payer: Self-pay

## 2020-08-24 DIAGNOSIS — I2510 Atherosclerotic heart disease of native coronary artery without angina pectoris: Secondary | ICD-10-CM | POA: Diagnosis not present

## 2020-08-24 DIAGNOSIS — I739 Peripheral vascular disease, unspecified: Secondary | ICD-10-CM | POA: Diagnosis not present

## 2020-08-24 LAB — POCT INR
INR: 3.3 — AB (ref 2.0–3.0)
PT: 39.3

## 2020-08-24 NOTE — Patient Instructions (Signed)
Description   5 mg on Fridays and Mondays, 7.5 mg the rest of the days.  Recheck in 4 weeks.       

## 2020-09-06 ENCOUNTER — Other Ambulatory Visit: Payer: Self-pay

## 2020-09-06 ENCOUNTER — Encounter: Payer: Self-pay | Admitting: Family Medicine

## 2020-09-06 ENCOUNTER — Ambulatory Visit (INDEPENDENT_AMBULATORY_CARE_PROVIDER_SITE_OTHER): Payer: Medicare Other | Admitting: Family Medicine

## 2020-09-06 VITALS — BP 101/65 | HR 55 | Temp 98.2°F | Ht 74.0 in | Wt 204.0 lb

## 2020-09-06 DIAGNOSIS — I10 Essential (primary) hypertension: Secondary | ICD-10-CM

## 2020-09-06 DIAGNOSIS — I2510 Atherosclerotic heart disease of native coronary artery without angina pectoris: Secondary | ICD-10-CM | POA: Diagnosis not present

## 2020-09-06 DIAGNOSIS — I714 Abdominal aortic aneurysm, without rupture, unspecified: Secondary | ICD-10-CM

## 2020-09-06 DIAGNOSIS — E782 Mixed hyperlipidemia: Secondary | ICD-10-CM

## 2020-09-06 NOTE — Progress Notes (Deleted)
Established patient visit   Patient: [name] [name]   DOB: 25-May-1945   75 y.o. Male  MRN: [id number] Visit Date: 09/06/2020  Today's healthcare provider: Dortha Kern, PA-C   No chief complaint on file.  Subjective    HPI  Pt has concerns about medications he is currently taking based off his last lab results from 08/12/20 and previous as well.  Patient Active Problem List   Diagnosis Date Noted   Pseudoaphakia 08/13/2014   Posterior vitreous detachment 08/13/2014   Detached retina 08/13/2014   Cataract 08/13/2014   Ischemic foot 11/30/2013   PAD (peripheral artery disease) (HCC) 10/29/2013   Bradycardia 10/25/2013   Lower limb ischemia 10/23/2013   Popliteal artery occlusion, left (HCC) 10/23/2013   Redness of skin-Left leg/foot 10/21/2013   Paresthesia of left foot 10/21/2013   Sensation of cold in lower extremity-Left foot 10/21/2013   Pain of left lower leg 10/21/2013   PVD (peripheral vascular disease) (HCC) 08/03/2013   AAA (abdominal aortic aneurysm) (HCC) 06/23/2013   HTN (hypertension) 06/23/2013   Popliteal aneurysm (HCC) 07/28/2012   Aneurysm of artery of lower extremity (HCC) 12/03/2011   Aneurysm of abdominal vessel (HCC) 05/28/2011   Hyperlipidemia 10/11/2009   CORONARY ATHEROSCLEROSIS NATIVE CORONARY ARTERY 10/11/2009   ABDOMINAL BRUIT 10/11/2009   OTHER DYSPNEA AND RESPIRATORY ABNORMALITIES 10/11/2009   Past Medical History:  Diagnosis Date   Abdominal aortic aneurysm (HCC)    Chronic kidney disease    Right kidney stone   Coronary artery disease    Detached retina    Hyperlipidemia    Hypertension    Myocardial infarction [facility])    2007 heart stent   Popliteal aneurysm (HCC)    Allergies  Allergen Reactions   Omnipaque [Iohexol] Rash and Other (See Comments)    Pt requires full premeds and needs to bring a driver.  Broke out in rash on back and arms.  Ok w/ premeds   Oxytetracycline Rash and Other (See Comments)    Terramycin  causes rash   Alteplase Other (See Comments)    Internal bleeding, per RN in short stay. Patient requested this to be added to his allergy list   Medications: Outpatient Medications Prior to Visit  Medication Sig   Aspirin Buf,CaCarb-MgCarb-MgO, 81 MG TABS Take 1 tablet by mouth daily.   Coenzyme Q10 (CO Q-10) 100 MG CAPS Take 100 capsules by mouth daily.   fenofibrate 54 MG tablet Take 1 tablet (54 mg total) by mouth daily.   metoprolol tartrate (LOPRESSOR) 25 MG tablet TAKE 1 TABLET BY MOUTH TWICE A DAY   Multiple Vitamin (MULTIVITAMIN WITH MINERALS) TABS tablet Take 1 tablet by mouth daily.   Omega-3 Fatty Acids (FISH OIL TRIPLE STRENGTH) 1400 MG CAPS Take 2,800 mg by mouth 2 (two) times daily.   rosuvastatin (CRESTOR) 10 MG tablet Take 1 tablet (10 mg total) by mouth daily.   Saw Palmetto 450 MG CAPS Take 900 mg by mouth 2 (two) times daily.   warfarin (COUMADIN) 5 MG tablet TAKE 1 AND 1/2 TABLETS BY MOUTH ONCE DAILY EXCEPT TAKE 2 TABLETS ON MONDAY   No facility-administered medications prior to visit.    Review of Systems  Constitutional: Negative.   HENT: Negative.    Respiratory: Negative.    Cardiovascular: Negative.   Gastrointestinal: Negative.   Musculoskeletal: Negative.   All other systems reviewed and are negative.  Last lipids Lab Results  Component Value Date   CHOL 134 08/12/2020  HDL 27 (L) 08/12/2020   LDLCALC 59 08/12/2020   TRIG 306 (H) 08/12/2020   CHOLHDL 5.0 08/12/2020       Objective    BP 101/65   Pulse (!) 55   Temp 98.2 F (36.8 C) (Oral)   Ht 6\' 2"  (1.88 m)   Wt 204 lb (92.5 kg)   SpO2 98%   BMI 26.19 kg/m  BP Readings from Last 3 Encounters:  09/06/20 101/65  03/14/20 123/76  03/11/20 130/60   Wt Readings from Last 3 Encounters:  09/06/20 204 lb (92.5 kg)  03/14/20 208 lb (94.3 kg)  03/11/20 213 lb (96.6 kg)   Physical Exam Constitutional:      General: He is not in acute distress.    Appearance: He is well-developed.   HENT:     Head: Normocephalic and atraumatic.     Right Ear: Hearing normal.     Left Ear: Hearing normal.     Nose: Nose normal.  Eyes:     General: Lids are normal. No scleral icterus.       Right eye: No discharge.        Left eye: No discharge.     Conjunctiva/sclera: Conjunctivae normal.  Cardiovascular:     Rate and Rhythm: Normal rate and regular rhythm.     Heart sounds: Normal heart sounds.  Pulmonary:     Effort: Pulmonary effort is normal. No respiratory distress.  Musculoskeletal:        General: Normal range of motion.  Skin:    Findings: No lesion or rash.  Neurological:     Mental Status: He is alert and oriented to person, place, and time.  Psychiatric:        Speech: Speech normal.        Behavior: Behavior normal.        Thought Content: Thought content normal.     No results found for any visits on 09/06/20.  Assessment & Plan     1. Mixed hyperlipidemia Tolerating Fenofibrate with Crestor without significant side effects. Triglycerides in April 2022 went up to 563 and started the Fenofibrate at that time. On 08-12-20, triglycerides back down to 306, HDl 27 with LDL 59. Continue present regimen and recheck lipid panel in 2 months.  2. Essential hypertension BP well controlled with Lopressor 25 mg BID. No chest pains or palpitations. Recheck in 3-6 months.  3. Abdominal aortic aneurysm (AAA) without rupture (HCC) Followed by Dr. 12-17-1975 (vascular surgeon) on 03-14-20 showed maximum aortic diameter at 3 cm and no endo leak seen. Plans to repeat ultrasound in 1 year.   No follow-ups on file.      {provider attestation***:1}   10-06-1999, Dortha Kern  [facility] [phone] (phone) [phone] (fax)  [facility]

## 2020-09-07 ENCOUNTER — Telehealth: Payer: Self-pay | Admitting: Cardiovascular Disease

## 2020-09-07 NOTE — Telephone Encounter (Signed)
Pt c/o medication issue:  1. Name of Medication:  rosuvastatin (CRESTOR) 10 MG tablet  2. How are you currently taking this medication (dosage and times per day)?  As prescribed  3. Are you having a reaction (difficulty breathing--STAT)?  No   4. What is your medication issue?  Patient states he is stopping this medication and going back on Atorvastatin.

## 2020-09-07 NOTE — Telephone Encounter (Signed)
Spoke with the patient who states that he saw his PCP and has decided to stop taking rosuvastatin and start back on atorvastatin 40 mg daily.  Patient's med list has been updated.

## 2020-09-21 ENCOUNTER — Other Ambulatory Visit: Payer: Self-pay

## 2020-09-21 ENCOUNTER — Ambulatory Visit (INDEPENDENT_AMBULATORY_CARE_PROVIDER_SITE_OTHER): Payer: Medicare Other

## 2020-09-21 DIAGNOSIS — I739 Peripheral vascular disease, unspecified: Secondary | ICD-10-CM | POA: Diagnosis not present

## 2020-09-21 DIAGNOSIS — I2510 Atherosclerotic heart disease of native coronary artery without angina pectoris: Secondary | ICD-10-CM | POA: Diagnosis not present

## 2020-09-21 LAB — POCT INR
INR: 2.9 (ref 2.0–3.0)
PT: 34.8

## 2020-09-21 NOTE — Patient Instructions (Signed)
Description   5 mg on Fridays and Mondays, 7.5 mg the rest of the days.  Recheck in 4 weeks.

## 2020-10-19 ENCOUNTER — Ambulatory Visit (INDEPENDENT_AMBULATORY_CARE_PROVIDER_SITE_OTHER): Payer: Medicare Other

## 2020-10-19 ENCOUNTER — Other Ambulatory Visit: Payer: Self-pay

## 2020-10-19 DIAGNOSIS — I739 Peripheral vascular disease, unspecified: Secondary | ICD-10-CM | POA: Diagnosis not present

## 2020-10-19 LAB — POCT INR
INR: 2.5 (ref 2.0–3.0)
Prothrombin Time: 29.8

## 2020-10-19 NOTE — Patient Instructions (Signed)
Description   5 mg on Fridays and Mondays, 7.5 mg the rest of the days.  Recheck in 4 weeks.       

## 2020-11-02 ENCOUNTER — Other Ambulatory Visit: Payer: Self-pay

## 2020-11-02 ENCOUNTER — Ambulatory Visit: Payer: Self-pay | Admitting: *Deleted

## 2020-11-02 DIAGNOSIS — E782 Mixed hyperlipidemia: Secondary | ICD-10-CM

## 2020-11-02 NOTE — Telephone Encounter (Signed)
Please review for Dennis.    Thanks,   -Reita Shindler  

## 2020-11-02 NOTE — Telephone Encounter (Signed)
Reason for Disposition  [1] Caller has NON-URGENT medicine question about med that PCP prescribed AND [2] triager unable to answer question  Answer Assessment - Initial Assessment Questions 1. NAME of MEDICATION: "What medicine are you calling about?"     I saw Maurine Minister a couple of months ago.  I was taken off the rosuvastatin and started the atorvastatin 40 mg.     2. QUESTION: "What is your question?" (e.g., double dose of medicine, side effect)     Maurine Minister said I needed my lipids checked in Oct.     On Oct 5th can I have those labs checked at [facility].   Is that ok ?   The change over was done July 26 from the rosuvastatin to the atorvastatin.   Is that timeframe ok with Maurine Minister for having my lipids rechecked on Oct. 5th?  3. PRESCRIBING HCP: "Who prescribed it?" Reason: if prescribed by specialist, call should be referred to that group.     Dennis [name], PA-C 4. SYMPTOMS: "Do you have any symptoms?"     N/A 5. SEVERITY: If symptoms are present, ask "Are they mild, moderate or severe?"     N/A 6. PREGNANCY:  "Is there any chance that you are pregnant?" "When was your last menstrual period?"     N/A  Protocols used: Medication Question Call-A-AH

## 2020-11-02 NOTE — Telephone Encounter (Signed)
I returned pt's call.   He had called in to ask if his Lipid panel (blood work) could be scheduled for Oct 5th.   He was switched from rosuvastatin 10 mg to atorvastatin 40 mg on September 06, 2020.  Is Maurine Minister ok with the timeframe between the switch over and having his lipids checked on Oct 5th? If so he is asking that Maurine Minister please put the lab orders in for the lipid panel for Oct. 5th 2022.   Thanks.  I sent my notes to [facility] with his request.

## 2020-11-05 ENCOUNTER — Encounter: Payer: Self-pay | Admitting: Family Medicine

## 2020-11-05 NOTE — Progress Notes (Signed)
Established patient visit   Patient: [name] [name]   DOB: 11-26-1945   75 y.o. Male  MRN: [id number] Visit Date: 09/06/2020  Today's healthcare provider: Dortha Kern, PA-C   No chief complaint on file.  Subjective    HPI  Pt has concerns about medications he is currently taking based off his last lab results from 08/12/20 and previous as well.  Patient Active Problem List   Diagnosis Date Noted   Pseudoaphakia 08/13/2014   Posterior vitreous detachment 08/13/2014   Detached retina 08/13/2014   Cataract 08/13/2014   Ischemic foot 11/30/2013   PAD (peripheral artery disease) (HCC) 10/29/2013   Bradycardia 10/25/2013   Lower limb ischemia 10/23/2013   Popliteal artery occlusion, left (HCC) 10/23/2013   Redness of skin-Left leg/foot 10/21/2013   Paresthesia of left foot 10/21/2013   Sensation of cold in lower extremity-Left foot 10/21/2013   Pain of left lower leg 10/21/2013   PVD (peripheral vascular disease) (HCC) 08/03/2013   AAA (abdominal aortic aneurysm) (HCC) 06/23/2013   HTN (hypertension) 06/23/2013   Popliteal aneurysm (HCC) 07/28/2012   Aneurysm of artery of lower extremity (HCC) 12/03/2011   Aneurysm of abdominal vessel (HCC) 05/28/2011   Hyperlipidemia 10/11/2009   CORONARY ATHEROSCLEROSIS NATIVE CORONARY ARTERY 10/11/2009   ABDOMINAL BRUIT 10/11/2009   OTHER DYSPNEA AND RESPIRATORY ABNORMALITIES 10/11/2009   Past Medical History:  Diagnosis Date   Abdominal aortic aneurysm (HCC)    Chronic kidney disease    Right kidney stone   Coronary artery disease    Detached retina    Hyperlipidemia    Hypertension    Myocardial infarction [facility])    2007 heart stent   Popliteal aneurysm (HCC)    Social History   Tobacco Use   Smoking status: Former    Years: 15.00    Types: Cigarettes    Quit date: 09/21/1985    Years since quitting: 35.1   Smokeless tobacco: Current    Types: Chew   Tobacco comments:    once a day chews tobacco  Vaping Use    Vaping Use: Never used  Substance Use Topics   Alcohol use: Yes    Alcohol/week: 0.0 - 1.0 standard drinks    Comment: 1 beer ocassionally (mostly in summer)   Drug use: No    Allergies  Allergen Reactions   Omnipaque [Iohexol] Rash and Other (See Comments)    Pt requires full premeds and needs to bring a driver.  Broke out in rash on back and arms.  Ok w/ premeds   Oxytetracycline Rash and Other (See Comments)    Terramycin causes rash   Alteplase Other (See Comments)    Internal bleeding, per RN in short stay. Patient requested this to be added to his allergy list   Medications:  Outpatient Medications Prior to Visit  Medication Sig   Aspirin Buf,CaCarb-MgCarb-MgO, 81 MG TABS Take 1 tablet by mouth daily.   Coenzyme Q10 (CO Q-10) 100 MG CAPS Take 100 capsules by mouth daily.   fenofibrate 54 MG tablet Take 1 tablet (54 mg total) by mouth daily.   metoprolol tartrate (LOPRESSOR) 25 MG tablet TAKE 1 TABLET BY MOUTH TWICE A DAY   Multiple Vitamin (MULTIVITAMIN WITH MINERALS) TABS tablet Take 1 tablet by mouth daily.   Omega-3 Fatty Acids (FISH OIL TRIPLE STRENGTH) 1400 MG CAPS Take 2,800 mg by mouth 2 (two) times daily.   rosuvastatin (CRESTOR) 10 MG tablet Take 1 tablet (10 mg total) by mouth daily. (  Patient not taking: Reported on 09/07/2020)   Saw Palmetto 450 MG CAPS Take 900 mg by mouth 2 (two) times daily.   warfarin (COUMADIN) 5 MG tablet TAKE 1 AND 1/2 TABLETS BY MOUTH ONCE DAILY EXCEPT TAKE 2 TABLETS ON MONDAY   No facility-administered medications prior to visit.    Review of Systems  Constitutional: Negative.   HENT: Negative.    Respiratory: Negative.    Cardiovascular: Negative.   Gastrointestinal: Negative.   Musculoskeletal: Negative.   All other systems reviewed and are negative.  Last lipids Lab Results  Component Value Date   CHOL 134 08/12/2020   HDL 27 (L) 08/12/2020   LDLCALC 59 08/12/2020   TRIG 306 (H) 08/12/2020   CHOLHDL 5.0 08/12/2020        Objective    BP 101/65   Pulse (!) 55   Temp 98.2 F (36.8 C) (Oral)   Ht 6\' 2"  (1.88 m)   Wt 204 lb (92.5 kg)   SpO2 98%   BMI 26.19 kg/m  BP Readings from Last 3 Encounters:  09/06/20 101/65  03/14/20 123/76  03/11/20 130/60    Wt Readings from Last 3 Encounters:  09/06/20 204 lb (92.5 kg)  03/14/20 208 lb (94.3 kg)  03/11/20 213 lb (96.6 kg)    Physical Exam Constitutional:      General: He is not in acute distress.    Appearance: He is well-developed.  HENT:     Head: Normocephalic and atraumatic.     Right Ear: Hearing normal.     Left Ear: Hearing normal.     Nose: Nose normal.  Eyes:     General: Lids are normal. No scleral icterus.       Right eye: No discharge.        Left eye: No discharge.     Conjunctiva/sclera: Conjunctivae normal.  Cardiovascular:     Rate and Rhythm: Normal rate and regular rhythm.     Heart sounds: Normal heart sounds.  Pulmonary:     Effort: Pulmonary effort is normal. No respiratory distress.  Musculoskeletal:        General: Normal range of motion.  Skin:    Findings: No lesion or rash.  Neurological:     Mental Status: He is alert and oriented to person, place, and time.  Psychiatric:        Speech: Speech normal.        Behavior: Behavior normal.        Thought Content: Thought content normal.     No results found for any visits on 09/06/20.  Assessment & Plan     1. Mixed hyperlipidemia Tolerating Fenofibrate with Crestor without significant side effects. Triglycerides in April 2022 went up to 563 and started the Fenofibrate at that time. On 08-12-20, triglycerides back down to 306, HDl 27 with LDL 59. Continue present regimen and recheck lipid panel in 2 months.  2. Essential hypertension BP well controlled with Lopressor 25 mg BID. No chest pains or palpitations. Recheck in 3-6 months.  3. Abdominal aortic aneurysm (AAA) without rupture (HCC) Followed by Dr. 12-17-1975 (vascular surgeon) on 03-14-20 showed maximum  aortic diameter at 3 cm and no endo leak seen. Plans to repeat ultrasound in 1 year.   Return if symptoms worsen or fail to improve.      I, Hinton Luellen, PA-C, have reviewed all documentation for this visit. The documentation on 11/05/20 for the exam, diagnosis, procedures, and orders are all accurate and complete.  Dortha Kern, PA-C  [facility] [phone] (phone) [phone] (fax)  [facility]

## 2020-11-16 ENCOUNTER — Ambulatory Visit (INDEPENDENT_AMBULATORY_CARE_PROVIDER_SITE_OTHER): Payer: Medicare Other

## 2020-11-16 ENCOUNTER — Other Ambulatory Visit: Payer: Self-pay

## 2020-11-16 DIAGNOSIS — I739 Peripheral vascular disease, unspecified: Secondary | ICD-10-CM | POA: Diagnosis not present

## 2020-11-16 DIAGNOSIS — E782 Mixed hyperlipidemia: Secondary | ICD-10-CM | POA: Diagnosis not present

## 2020-11-16 DIAGNOSIS — I2510 Atherosclerotic heart disease of native coronary artery without angina pectoris: Secondary | ICD-10-CM

## 2020-11-16 LAB — POCT INR
INR: 2.8 (ref 2.0–3.0)
PT: 33.6

## 2020-11-16 NOTE — Patient Instructions (Signed)
Description   5 mg on Fridays and Mondays, 7.5 mg the rest of the days.  Recheck in 4 weeks.

## 2020-11-17 LAB — LIPID PANEL
Chol/HDL Ratio: 4.2 ratio (ref 0.0–5.0)
Cholesterol, Total: 130 mg/dL (ref 100–199)
HDL: 31 mg/dL — ABNORMAL LOW (ref >39)
LDL Chol Calc (NIH): 71 mg/dL (ref 0–99)
Triglycerides: 160 mg/dL — ABNORMAL HIGH (ref 0–149)
VLDL Cholesterol Cal: 28 mg/dL (ref 5–40)

## 2020-12-06 ENCOUNTER — Other Ambulatory Visit: Payer: Self-pay | Admitting: Physician Assistant

## 2020-12-12 ENCOUNTER — Telehealth: Payer: Self-pay | Admitting: Family Medicine

## 2020-12-12 ENCOUNTER — Other Ambulatory Visit: Payer: Self-pay | Admitting: Cardiovascular Disease

## 2020-12-12 DIAGNOSIS — I10 Essential (primary) hypertension: Secondary | ICD-10-CM

## 2020-12-12 MED ORDER — METOPROLOL TARTRATE 25 MG PO TABS: 25.0000 mg | ORAL_TABLET | Freq: Two times a day (BID) | ORAL | 0 refills | Status: DC

## 2020-12-12 NOTE — Telephone Encounter (Signed)
[facility] faxed refill request for the following medications:  metoprolol tartrate (LOPRESSOR) 25 MG tablet   Please advise.  

## 2020-12-14 ENCOUNTER — Other Ambulatory Visit: Payer: Self-pay

## 2020-12-14 ENCOUNTER — Ambulatory Visit (INDEPENDENT_AMBULATORY_CARE_PROVIDER_SITE_OTHER): Payer: Medicare Other

## 2020-12-14 DIAGNOSIS — I739 Peripheral vascular disease, unspecified: Secondary | ICD-10-CM | POA: Diagnosis not present

## 2020-12-14 LAB — POCT INR
INR: 2.9 (ref 2.0–3.0)
PT: 35.3

## 2020-12-14 NOTE — Patient Instructions (Signed)
Description   5 mg on Fridays and Mondays, 7.5 mg the rest of the days.  Recheck in 4 weeks.       

## 2020-12-26 DIAGNOSIS — H33051 Total retinal detachment, right eye: Secondary | ICD-10-CM | POA: Diagnosis not present

## 2021-01-08 ENCOUNTER — Other Ambulatory Visit: Payer: Self-pay | Admitting: Cardiovascular Disease

## 2021-01-11 ENCOUNTER — Ambulatory Visit (INDEPENDENT_AMBULATORY_CARE_PROVIDER_SITE_OTHER): Payer: Medicare Other

## 2021-01-11 ENCOUNTER — Other Ambulatory Visit: Payer: Self-pay

## 2021-01-11 DIAGNOSIS — I739 Peripheral vascular disease, unspecified: Secondary | ICD-10-CM

## 2021-01-11 LAB — POCT INR
INR: 2.8 (ref 2.0–3.0)
PT: 33.4

## 2021-01-11 NOTE — Patient Instructions (Signed)
Description   5 mg on Fridays and Mondays, 7.5 mg the rest of the days.  Recheck in 4 weeks.

## 2021-02-08 ENCOUNTER — Other Ambulatory Visit: Payer: Self-pay

## 2021-02-08 ENCOUNTER — Ambulatory Visit (INDEPENDENT_AMBULATORY_CARE_PROVIDER_SITE_OTHER): Payer: Medicare Other

## 2021-02-08 DIAGNOSIS — I739 Peripheral vascular disease, unspecified: Secondary | ICD-10-CM | POA: Diagnosis not present

## 2021-02-08 LAB — POCT INR
INR: 3.2 — AB (ref 2.0–3.0)
Prothromin Time: 38.7

## 2021-02-08 NOTE — Patient Instructions (Signed)
Description    No change 5 mg on Fridays and Mondays, 7.5 mg the rest of the days.  Recheck in 4 weeks.

## 2021-02-10 ENCOUNTER — Other Ambulatory Visit: Payer: Self-pay

## 2021-02-10 ENCOUNTER — Emergency Department: Payer: No Typology Code available for payment source

## 2021-02-10 ENCOUNTER — Emergency Department
Admission: EM | Admit: 2021-02-10 | Discharge: 2021-02-10 | Disposition: A | Discharge status: Home / Self Care | Payer: No Typology Code available for payment source | Attending: Emergency Medicine | Admitting: Emergency Medicine

## 2021-02-10 ENCOUNTER — Encounter: Payer: Self-pay | Admitting: Emergency Medicine

## 2021-02-10 DIAGNOSIS — Z7901 Long term (current) use of anticoagulants: Secondary | ICD-10-CM | POA: Diagnosis not present

## 2021-02-10 DIAGNOSIS — Z79899 Other long term (current) drug therapy: Secondary | ICD-10-CM | POA: Insufficient documentation

## 2021-02-10 DIAGNOSIS — N189 Chronic kidney disease, unspecified: Secondary | ICD-10-CM | POA: Diagnosis not present

## 2021-02-10 DIAGNOSIS — M791 Myalgia, unspecified site: Secondary | ICD-10-CM | POA: Insufficient documentation

## 2021-02-10 DIAGNOSIS — K8080 Other cholelithiasis without obstruction: Secondary | ICD-10-CM | POA: Diagnosis not present

## 2021-02-10 DIAGNOSIS — I700 Atherosclerosis of aorta: Secondary | ICD-10-CM | POA: Insufficient documentation

## 2021-02-10 DIAGNOSIS — Y92410 Unspecified street and highway as the place of occurrence of the external cause: Secondary | ICD-10-CM | POA: Insufficient documentation

## 2021-02-10 DIAGNOSIS — I129 Hypertensive chronic kidney disease with stage 1 through stage 4 chronic kidney disease, or unspecified chronic kidney disease: Secondary | ICD-10-CM | POA: Insufficient documentation

## 2021-02-10 DIAGNOSIS — Z7982 Long term (current) use of aspirin: Secondary | ICD-10-CM | POA: Insufficient documentation

## 2021-02-10 DIAGNOSIS — I10 Essential (primary) hypertension: Secondary | ICD-10-CM | POA: Diagnosis not present

## 2021-02-10 DIAGNOSIS — I2510 Atherosclerotic heart disease of native coronary artery without angina pectoris: Secondary | ICD-10-CM | POA: Diagnosis not present

## 2021-02-10 DIAGNOSIS — Z87891 Personal history of nicotine dependence: Secondary | ICD-10-CM | POA: Diagnosis not present

## 2021-02-10 DIAGNOSIS — R52 Pain, unspecified: Secondary | ICD-10-CM | POA: Diagnosis not present

## 2021-02-10 DIAGNOSIS — G3101 Pick's disease: Secondary | ICD-10-CM | POA: Insufficient documentation

## 2021-02-10 DIAGNOSIS — Z043 Encounter for examination and observation following other accident: Secondary | ICD-10-CM | POA: Diagnosis not present

## 2021-02-10 MED ORDER — TRAMADOL HCL 50 MG PO TABS: 50.0000 mg | ORAL_TABLET | Freq: Four times a day (QID) | ORAL | 0 refills | Status: DC | PRN
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
## 2021-02-16 ENCOUNTER — Telehealth: Payer: Self-pay

## 2021-02-16 NOTE — Telephone Encounter (Signed)
Lmtcb I reviewed message below patient does not have an appt on 02/21/21 it was already cancelled, patient has a scheduled appt with his PCP on 03/01/21 and has a nurse visit scheduled for PT/INR check on 03/08/21. If patient has concerns of a past bill he can contact our billing department at [phone]. KW

## 2021-02-16 NOTE — Telephone Encounter (Signed)
Copied from CRM [id number]. Topic: Complaint - Billing/Coding >> Feb 16, 2021 12:45 PM Laural Benes, [place] C wrote: Pt called in for assistance. Pt says that he had an apt scheduled for 1/10 and also 1/18. Pt is unsure if both visits are needed? ALSO, pt is upset because he says that this happened last year (he was scheduled for the same visits) pt says that he received a bill for 295.00. pt doesn't feel that his wellness visit last year was billed incorrectly.   Pt would like further assistance.   Route to Research officer, political party.

## 2021-02-16 NOTE — Telephone Encounter (Signed)
Copied from CRM #[id number]. Topic: Complaint - Billing/Coding °>> Feb 16, 2021 12:45 PM [name], [name] wrote: °Pt called in for assistance. Pt says that he had an apt scheduled for 1/10 and also 1/18. Pt is unsure if both visits are needed? ALSO, pt is upset because he says that this happened last year (he was scheduled for the same visits) pt says that he received a bill for 295.00. pt doesn't feel that his wellness visit last year was billed incorrectly.  ° °Pt would like further assistance.  ° °Route to Practice Administrator. °

## 2021-02-21 NOTE — Telephone Encounter (Signed)
Sent an email to [employer] about 02/18/20 and 02/08/20 regarding incorrect charges.

## 2021-02-22 ENCOUNTER — Other Ambulatory Visit: Payer: Self-pay

## 2021-02-22 DIAGNOSIS — I739 Peripheral vascular disease, unspecified: Secondary | ICD-10-CM

## 2021-02-22 DIAGNOSIS — I714 Abdominal aortic aneurysm, without rupture, unspecified: Secondary | ICD-10-CM

## 2021-03-01 ENCOUNTER — Other Ambulatory Visit: Payer: Self-pay

## 2021-03-01 ENCOUNTER — Ambulatory Visit (INDEPENDENT_AMBULATORY_CARE_PROVIDER_SITE_OTHER): Payer: Medicare Other | Admitting: Family Medicine

## 2021-03-01 ENCOUNTER — Encounter: Payer: Self-pay | Admitting: Family Medicine

## 2021-03-01 ENCOUNTER — Telehealth: Payer: Self-pay

## 2021-03-01 VITALS — BP 105/59 | HR 56 | Resp 16 | Ht 74.0 in | Wt 203.1 lb

## 2021-03-01 DIAGNOSIS — R001 Bradycardia, unspecified: Secondary | ICD-10-CM

## 2021-03-01 DIAGNOSIS — N2889 Other specified disorders of kidney and ureter: Secondary | ICD-10-CM | POA: Diagnosis not present

## 2021-03-01 DIAGNOSIS — Z0000 Encounter for general adult medical examination without abnormal findings: Secondary | ICD-10-CM

## 2021-03-01 DIAGNOSIS — I739 Peripheral vascular disease, unspecified: Secondary | ICD-10-CM | POA: Diagnosis not present

## 2021-03-01 DIAGNOSIS — E782 Mixed hyperlipidemia: Secondary | ICD-10-CM

## 2021-03-01 DIAGNOSIS — R209 Unspecified disturbances of skin sensation: Secondary | ICD-10-CM

## 2021-03-01 DIAGNOSIS — M791 Myalgia, unspecified site: Secondary | ICD-10-CM

## 2021-03-01 DIAGNOSIS — I714 Abdominal aortic aneurysm, without rupture, unspecified: Secondary | ICD-10-CM | POA: Diagnosis not present

## 2021-03-01 DIAGNOSIS — F17220 Nicotine dependence, chewing tobacco, uncomplicated: Secondary | ICD-10-CM

## 2021-03-01 DIAGNOSIS — E785 Hyperlipidemia, unspecified: Secondary | ICD-10-CM

## 2021-03-01 NOTE — Assessment & Plan Note (Signed)
Advise pt to address concern with cards and vascular to address HR in 50s

## 2021-03-01 NOTE — Assessment & Plan Note (Signed)
Encourage reduction Reports use when 'outside' Unsure if dentist checks for oral lesions

## 2021-03-01 NOTE — Assessment & Plan Note (Signed)
F/b vascular with routine [place] BP controlled

## 2021-03-01 NOTE — Assessment & Plan Note (Signed)
Complaints of numbness and coolness s/p bypass in LLE BP controlled

## 2021-03-01 NOTE — Assessment & Plan Note (Signed)
D/t PAD- stable per pt report

## 2021-03-01 NOTE — Assessment & Plan Note (Signed)
Known 3 vessel dz Continue statin and bp control Repeat lipid panel

## 2021-03-01 NOTE — Patient Instructions (Signed)
Check with cardiology and vascular team about decrease of beta blocker to 12.5 mg to reflect on bradycardia and hypotension.

## 2021-03-01 NOTE — Assessment & Plan Note (Signed)
MRI ordered; insurance pending

## 2021-03-01 NOTE — Assessment & Plan Note (Signed)
UTD on dental and eye exams Does not want any vaccines at this time

## 2021-03-01 NOTE — Telephone Encounter (Signed)
Copied from CRM [id number]. Topic: General - Other >> Mar 01, 2021  1:35 PM [name] [name] wrote: Reason for CRM: Pt states that MRI contrast makes him break out in a rash.Please advise
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
## 2021-03-02 ENCOUNTER — Telehealth: Payer: Self-pay

## 2021-03-02 ENCOUNTER — Telehealth: Payer: Self-pay | Admitting: Cardiovascular Disease

## 2021-03-02 NOTE — Telephone Encounter (Signed)
That sounds fine. OK to cut his metoprolol in half. thanks

## 2021-03-02 NOTE — Telephone Encounter (Signed)
Patient advised, he states that they normally give a benadryl before MRI and states that he should be fine. Patient states that he was not contacted back yesterday from [facility] and states that he does not know time and date of MRI. Can you reach back out to patient with this information? KW

## 2021-03-02 NOTE — Telephone Encounter (Signed)
The patient is calling in complaining of low heart rate and wanting to decrease his Metoprolol tartrate 25 mg BID. Patient's Family Medicine doctor with [facility] in [place] prescribes this medication. He went and saw [name], FNP yesterday (03/02/21) with [facility] and she noted that his HR has been running low for the last few visits with them. She suggested that the patient check with his cardiologist to see if Dr. Excell Seltzer thought the BB should be decreased.   Pulled reading from OV's, patient does not have a way to check VS at home.  This year 03/01/21- 105/59 56  Last year 09/06/20- 101/65 55  03/14/20- 123/76 57  03/11/20- 130/60  64  Patient states he is lightheaded on occasion but not often. No symptoms otherwise. ED precautions given. Patient verbalized understanding.   Will forward to MD for advisement.

## 2021-03-02 NOTE — Telephone Encounter (Signed)
Patient advised.KW 

## 2021-03-02 NOTE — Telephone Encounter (Signed)
Pt c/o medication issue:  1. Name of Medication: metoprolol tartrate (LOPRESSOR) 25 MG tablet  2. How are you currently taking this medication (dosage and times per day)? Take 1 tablet (25 mg total) by mouth 2 (two) times daily.  3. Are you having a reaction (difficulty breathing--STAT)? Low hr   4. What is your medication issue? Pts pcp is requesting he cut down on his metoprolol due to a low hr... please advise.

## 2021-03-03 MED ORDER — METOPROLOL TARTRATE 25 MG PO TABS: 12.5000 mg | ORAL_TABLET | Freq: Two times a day (BID) | ORAL | 3 refills | Status: DC

## 2021-03-03 NOTE — Telephone Encounter (Signed)
Left message to call back  

## 2021-03-03 NOTE — Telephone Encounter (Signed)
Agree.  He is currently taking Metoprolol Tartrate 25 mg twice daily.   Decrease to Metoprolol tartrate 12.5 mg twice daily. Tereso Newcomer, PA-C    03/03/2021 7:57 AM

## 2021-03-03 NOTE — Telephone Encounter (Signed)
Notified patient of recommendation. Send in reduced medication.  Patient verbalized understanding.

## 2021-03-06 ENCOUNTER — Other Ambulatory Visit: Payer: Self-pay | Admitting: Cardiovascular Disease

## 2021-03-08 ENCOUNTER — Other Ambulatory Visit: Payer: Self-pay

## 2021-03-08 ENCOUNTER — Ambulatory Visit (INDEPENDENT_AMBULATORY_CARE_PROVIDER_SITE_OTHER): Payer: Medicare Other

## 2021-03-08 DIAGNOSIS — I739 Peripheral vascular disease, unspecified: Secondary | ICD-10-CM

## 2021-03-08 LAB — POCT INR
INR: 2.4 (ref 2.0–3.0)
PT: 28.5

## 2021-03-08 NOTE — Patient Instructions (Signed)
Description   ° No change 5 mg on Fridays and Mondays, 7.5 mg the rest of the days.  Recheck in 4 weeks.  ° °  °  °

## 2021-03-11 ENCOUNTER — Ambulatory Visit
Admission: RE | Admit: 2021-03-11 | Discharge: 2021-03-11 | Disposition: A | Discharge status: Home / Self Care | Payer: Medicare Other | Source: Ambulatory Visit | Attending: Family Medicine | Admitting: Family Medicine

## 2021-03-11 ENCOUNTER — Other Ambulatory Visit: Payer: Self-pay

## 2021-03-11 DIAGNOSIS — N2889 Other specified disorders of kidney and ureter: Secondary | ICD-10-CM | POA: Diagnosis present

## 2021-03-11 MED ORDER — GADOBUTROL 1 MMOL/ML IV SOLN
9.0000 mL | Freq: Once | INTRAVENOUS | Status: AC | PRN
  Administered 2021-03-11: 10 mL via INTRAVENOUS

## 2021-03-13 ENCOUNTER — Ambulatory Visit: Payer: Medicare Other | Admitting: Surgery

## 2021-03-13 ENCOUNTER — Ambulatory Visit (INDEPENDENT_AMBULATORY_CARE_PROVIDER_SITE_OTHER)
Admission: RE | Admit: 2021-03-13 | Discharge: 2021-03-13 | Disposition: A | Discharge status: Home / Self Care | Payer: Medicare Other | Source: Ambulatory Visit | Attending: Surgery | Admitting: Surgery

## 2021-03-13 ENCOUNTER — Other Ambulatory Visit: Payer: Self-pay

## 2021-03-13 ENCOUNTER — Ambulatory Visit (HOSPITAL_COMMUNITY)
Admission: RE | Admit: 2021-03-13 | Discharge: 2021-03-13 | Disposition: A | Discharge status: Home / Self Care | Payer: Medicare Other | Source: Ambulatory Visit | Attending: Surgery | Admitting: Surgery

## 2021-03-13 DIAGNOSIS — I714 Abdominal aortic aneurysm, without rupture, unspecified: Secondary | ICD-10-CM | POA: Diagnosis present

## 2021-03-13 DIAGNOSIS — I739 Peripheral vascular disease, unspecified: Secondary | ICD-10-CM

## 2021-03-15 ENCOUNTER — Telehealth: Payer: Self-pay | Admitting: Family Medicine

## 2021-03-15 NOTE — Telephone Encounter (Signed)
Please review results and advise. KW

## 2021-03-15 NOTE — Telephone Encounter (Signed)
Copied from CRM [id number]. Topic: General - Other >> Mar 15, 2021  2:49 PM Jaquita Rector A wrote: Reason for CRM: Patient called in to inquire of [name] about results from an MRI from Saturday 03/11/21. Per patient he is concerned and waiting for a call with the result. Per patient he's concerned can be reached at  Ph# [phone]

## 2021-03-16 ENCOUNTER — Other Ambulatory Visit: Payer: Self-pay | Admitting: Family Medicine

## 2021-03-16 DIAGNOSIS — N281 Cyst of kidney, acquired: Secondary | ICD-10-CM

## 2021-03-16 NOTE — Telephone Encounter (Signed)
Confirmation of concern re- Benign cyst seen on L kidney. Bosniak II. Benign in nature. No additional follow up needed.  Also, multiple cysts in both kidneys.  Recommend MRI for 6 month follow up for concern R kidney cyst, however, not clear, and may have been movement concern on imaging and a mis-registration. <1 cm. This could be a small area that had bleed which is why they recommend 6 month f/u to check for growth/resolve.  1.7 cm L liver cyst; no additional f/u needed d/t small size.

## 2021-03-17 ENCOUNTER — Ambulatory Visit: Payer: Medicare Other | Admitting: Cardiovascular Disease

## 2021-03-17 ENCOUNTER — Encounter: Payer: Self-pay | Admitting: Cardiovascular Disease

## 2021-03-17 ENCOUNTER — Other Ambulatory Visit: Payer: Self-pay

## 2021-03-17 VITALS — BP 108/80 | HR 60 | Ht 74.0 in | Wt 201.4 lb

## 2021-03-17 DIAGNOSIS — E782 Mixed hyperlipidemia: Secondary | ICD-10-CM

## 2021-03-17 DIAGNOSIS — I2510 Atherosclerotic heart disease of native coronary artery without angina pectoris: Secondary | ICD-10-CM

## 2021-03-17 DIAGNOSIS — I10 Essential (primary) hypertension: Secondary | ICD-10-CM | POA: Diagnosis not present

## 2021-03-17 DIAGNOSIS — I739 Peripheral vascular disease, unspecified: Secondary | ICD-10-CM | POA: Diagnosis not present

## 2021-03-17 MED ORDER — METOPROLOL SUCCINATE ER 25 MG PO TB24: 25.0000 mg | ORAL_TABLET | Freq: Every day | ORAL | 3 refills | Status: DC

## 2021-03-17 NOTE — Patient Instructions (Signed)
Medication Instructions:  START Metoprolol Succinate (TOPROL XL) 25mg  once daily *If you need a refill on your cardiac medications before your next appointment, please call your pharmacy*   Lab Work: NONE If you have labs (blood work) drawn today and your tests are completely normal, you will receive your results only by: MyChart Message (if you have MyChart) OR A paper copy in the mail If you have any lab test that is abnormal or we need to change your treatment, we will call you to review the results.   Testing/Procedures: NONE   Follow-Up: At [facility], you and your health needs are our priority.  As part of our continuing mission to provide you with exceptional heart care, we have created designated Provider Care Teams.  These Care Teams include your primary Cardiologist (physician) and Advanced Practice Providers (APPs -  Physician Assistants and Nurse Practitioners) who all work together to provide you with the care you need, when you need it.  We recommend signing up for the patient portal called "MyChart".  Sign up information is provided on this After Visit Summary.  MyChart is used to connect with patients for Virtual Visits (Telemedicine).  Patients are able to view lab/test results, encounter notes, upcoming appointments, etc.  Non-urgent messages can be sent to your provider as well.   To learn more about what you can do with MyChart, go to [facility].    Your next appointment:   1 year(s)  The format for your next appointment:   In Person  Provider:   [email or web address], MD    Thank you for allowing myself and [facility] to serve you, God Bless!!
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
## 2021-03-20 ENCOUNTER — Other Ambulatory Visit: Payer: Self-pay

## 2021-03-20 ENCOUNTER — Encounter: Payer: Self-pay | Admitting: Surgery

## 2021-03-20 ENCOUNTER — Ambulatory Visit: Payer: Medicare Other | Admitting: Surgery

## 2021-03-20 VITALS — BP 125/73 | HR 62 | Temp 97.7°F | Resp 16 | Ht 74.0 in | Wt 200.0 lb

## 2021-03-20 DIAGNOSIS — I7143 Infrarenal abdominal aortic aneurysm, without rupture: Secondary | ICD-10-CM | POA: Diagnosis not present

## 2021-03-20 DIAGNOSIS — I739 Peripheral vascular disease, unspecified: Secondary | ICD-10-CM

## 2021-03-20 NOTE — Progress Notes (Signed)
[facility] [place]  Patient name: [name] [name] MRN: [id number] DOB: 12-07-1945 Sex: male   REASON FOR VISIT:    Follow up  [name]:    [name] [name] is a 76 y.o. male who is back today for follow-up.  He has undergone the following procedures:               -October 2011: Endovascular aneurysm repair, with known type II endoleak             -October 2011: Endovascular repair of left popliteal aneurysm             -October 29, 2013: Femoral posterior tibial bypass graft: This was performed after failed attempted lytic therapy for occluded popliteal stents.  The bypass graft occluded early requiring reexploration.  The posterior tibial artery was opened behind the ankle and Fogarty catheters were able to be passed around the pedal arch.             -05/25/2014: Drug-coated balloon angioplasty to the distal anastomosis of his left leg bypass             -10/22/2017: Drug-coated balloon angioplasty, left distal anastomosis of his left leg bypass The patient continues to take a statin for hypercholesterolemia.  He is on Coumadin. He feels like he is getting more feeling back in his left foot   PAST MEDICAL HISTORY:   Past Medical History:  Diagnosis Date   Abdominal aortic aneurysm    Chronic kidney disease    Right kidney stone   Coronary artery disease    Detached retina    Hyperlipidemia    Hypertension    Myocardial infarction [facility])    2007 heart stent   Popliteal aneurysm ([place])      FAMILY HISTORY:   Family History  Problem Relation Age of Onset   Aneurysm Sister        possibly    Peripheral vascular disease Brother     SOCIAL HISTORY:   Social History   Tobacco Use   Smoking status: Former    Years: 15.00    Types: Cigarettes    Quit date: 09/21/1985    Years since quitting: 35.5   Smokeless tobacco: Current    Types: Chew   Tobacco comments:    once a day chews  tobacco  Substance Use Topics   Alcohol use: Yes    Alcohol/week: 0.0 - 1.0 standard drinks    Comment: 1 beer ocassionally (mostly in summer)     ALLERGIES:   Allergies  Allergen Reactions   Omnipaque [Iohexol] Rash and Other (See Comments)    Pt requires full premeds and needs to bring a driver.  Broke out in rash on back and arms.  Ok w/ premeds   Oxytetracycline Rash and Other (See Comments)    Terramycin causes rash   Alteplase Other (See Comments)    Internal bleeding, per RN in short stay. Patient requested this to be added to his allergy list     CURRENT MEDICATIONS:   Current Outpatient Medications  Medication Sig Dispense Refill   Aspirin Buf,CaCarb-MgCarb-MgO, 81 MG TABS Take 1 tablet by mouth daily.     atorvastatin (LIPITOR) 40 MG tablet TAKE 1 TABLET BY MOUTH DAILY. PLEASE KEEP UPCOMING APPT IN FEBRUARY 2023 90 tablet 2   Coenzyme Q10 (CO Q-10) 100 MG CAPS Take 100 capsules by mouth daily.  0   fenofibrate 54 MG tablet TAKE 1 TABLET  BY MOUTH DAILY. PLEASE SCHEDULE AN APPT.WITH DR. Burt Knack FOR MORE REFILLS 90 tablet 2   metoprolol succinate (TOPROL XL) 25 MG 24 hr tablet Take 1 tablet (25 mg total) by mouth daily. 90 tablet 3   Multiple Vitamin (MULTIVITAMIN WITH MINERALS) TABS tablet Take 1 tablet by mouth daily.     Omega-3 Fatty Acids (FISH OIL TRIPLE STRENGTH) 1400 MG CAPS Take 2,800 mg by mouth 2 (two) times daily.     Saw Palmetto 450 MG CAPS Take 900 mg by mouth 2 (two) times daily.     warfarin (COUMADIN) 5 MG tablet TAKE 1 AND 1/2 TABLETS BY MOUTH ONCE DAILY EXCEPT TAKE 2 TABLETS ON MONDAY 143 tablet 4   No current facility-administered medications for this visit.    REVIEW OF SYSTEMS:   [X]  denotes positive finding, [ ]  denotes negative finding Cardiac  Comments:  Chest pain or chest pressure:    Shortness of breath upon exertion:    Short of breath when lying flat:    Irregular heart rhythm:        Vascular    Pain in calf, thigh, or hip brought  on by ambulation:    Pain in feet at night that wakes you up from your sleep:     Blood clot in your veins:    Leg swelling:         Pulmonary    Oxygen at home:    Productive cough:     Wheezing:         Neurologic    Sudden weakness in arms or legs:     Sudden numbness in arms or legs:     Sudden onset of difficulty speaking or slurred speech:    Temporary loss of vision in one eye:     Problems with dizziness:         Gastrointestinal    Blood in stool:     Vomited blood:         Genitourinary    Burning when urinating:     Blood in urine:        Psychiatric    Major depression:         Hematologic    Bleeding problems:    Problems with blood clotting too easily:        Skin    Rashes or ulcers:        Constitutional    Fever or chills:      PHYSICAL EXAM:   Vitals:   03/20/21 1047  BP: 125/73  Pulse: 62  Resp: 16  Temp: 97.7 F (36.5 C)  TempSrc: Temporal  SpO2: 95%  Weight: 90.7 kg  Height: 6\' 2"  (1.88 m)    GENERAL: The patient is a well-nourished male, in no acute distress. The vital signs are documented above. CARDIAC: There is a regular rate and rhythm.  VASCULAR: Nonpalpable left pedal pulse prominent varicosities on bilateral lower extremity PULMONARY: Non-labored respirations ABDOMEN: Soft and non-tender with normal pitched bowel sounds.  MUSCULOSKELETAL: There are no major deformities or cyanosis. NEUROLOGIC: No focal weakness or paresthesias are detected. SKIN: There are no ulcers or rashes noted. PSYCHIATRIC: The patient has a normal affect.  STUDIES:   I have reviewed the following studies: EVAR: Previous diameter was 3.96 cm.  Today's diameter is 3.52 cm  +-----------+--------+-----+--------+----------+--------+   RIGHT       PSV cm/s Ratio Stenosis Waveform   Comments   +-----------+--------+-----+--------+----------+--------+   CFA Distal  107  biphasic               +-----------+--------+-----+--------+----------+--------+   DFA         89                      biphasic              +-----------+--------+-----+--------+----------+--------+   SFA Prox    79                      biphasic              +-----------+--------+-----+--------+----------+--------+   SFA Mid     90                      biphasic              +-----------+--------+-----+--------+----------+--------+   SFA Distal  48                      biphasic              +-----------+--------+-----+--------+----------+--------+   POP Prox    31                      biphasic              +-----------+--------+-----+--------+----------+--------+   POP Distal  49                      biphasic              +-----------+--------+-----+--------+----------+--------+   ATA Distal  36                      monophasic            +-----------+--------+-----+--------+----------+--------+   PTA Distal  37                      monophasic            +-----------+--------+-----+--------+----------+--------+   PERO Distal                         absent                +-----------+--------+-----+--------+----------+--------+    +---------------+-------+-----------+--------+--------+-----+--------------  +   Right Popliteal AP (cm) Transv (cm) Waveform Stenosis Shape Comments           +---------------+-------+-----------+--------+--------+-----+--------------  +   Proximal        1.74    1.73                                Mid/distal  SFA   +---------------+-------+-----------+--------+--------+-----+--------------  +        +----------+--------+-----+--------+----------+--------+   LEFT       PSV cm/s Ratio Stenosis Waveform   Comments   +----------+--------+-----+--------+----------+--------+   PTA Distal 26                      monophasic            +----------+--------+-----+--------+----------+--------+      Left Graft #1: fem- PTA   +--------------------+--------+--------+----------+--------------+                        PSV cm/s Stenosis Waveform  Comments         +--------------------+--------+--------+----------+--------------+   Inflow               63                biphasic                    +--------------------+--------+--------+----------+--------------+   Proximal Anastomosis 31                monophasic                  +--------------------+--------+--------+----------+--------------+   Proximal Graft       53                biphasic                    +--------------------+--------+--------+----------+--------------+   Mid Graft            41                monophasic                  +--------------------+--------+--------+----------+--------------+   Distal Graft         68                monophasic                  +--------------------+--------+--------+----------+--------------+   Distal Anastomosis                                Not visualized   +--------------------+--------+--------+----------+--------------+   Outflow                                           Not visualized   +--------------------+--------+--------+----------+--------------+      Summary:  Right: No significant stenosis in the right lower extremity arteries.  Mid/distal SFA aneurysm 1.74 x 1.73 cm.   Left: Graft patent to the distal end. Distal anastomosis and outflow not  visualized. Dampened distal PTA waveform and velocity.  MEDICAL ISSUES:   AAA: The patient is aneurysm continues to decrease in size.  He will follow-up in 1 year for surveillance  Right leg: Mid SFA aneurysm 1.74 cm.  Continue with annual surveillance  Left leg: I have reviewed duplex studies from the past several years.  There does not appear to be any significant change.  He has a known occlusion of his posterior tibial artery distal to the bypass.  I have previously treated a stenosis in the distal bypass, which does not appear to be present  currently.  I Georgina Peer bring him back in 6 months to repeat his ultrasound to make sure.  He will then continue his annual surveillance.    Leia Alf, MD, FACS [facility] [phone] Pager [phone]

## 2021-03-23 ENCOUNTER — Other Ambulatory Visit: Payer: Self-pay | Admitting: *Deleted

## 2021-03-23 DIAGNOSIS — I739 Peripheral vascular disease, unspecified: Secondary | ICD-10-CM

## 2021-03-24 ENCOUNTER — Telehealth: Payer: Self-pay | Admitting: Family Medicine

## 2021-03-24 NOTE — Telephone Encounter (Signed)
Called and advised patient of Daneil Dan result message below in regards to MRI.KW   Confirmation of concern re- Benign cyst seen on L kidney. Bosniak II. Benign in nature. No additional follow up needed.   Also, multiple cysts in both kidneys.   Recommend MRI for 6 month follow up for concern R kidney cyst, however, not clear, and may have been movement concern on imaging and a mis-registration. <1 cm. This could be a small area that had bleed which is why they recommend 6 month f/u to check for growth/resolve.   1.7 cm L liver cyst; no additional f/u needed d/t small size.

## 2021-03-24 NOTE — Telephone Encounter (Signed)
Pt is calling for his results of  MR Abdomen W Wo Contrast (Accession [id number]) (Order [id number]) From 03/11/2021  [phone]-

## 2021-03-29 ENCOUNTER — Ambulatory Visit: Payer: Medicare Other

## 2021-04-04 ENCOUNTER — Ambulatory Visit: Payer: Medicare Other | Admitting: Physician Assistant

## 2021-04-05 ENCOUNTER — Ambulatory Visit (INDEPENDENT_AMBULATORY_CARE_PROVIDER_SITE_OTHER): Payer: Medicare Other

## 2021-04-05 ENCOUNTER — Other Ambulatory Visit: Payer: Self-pay

## 2021-04-05 DIAGNOSIS — I739 Peripheral vascular disease, unspecified: Secondary | ICD-10-CM | POA: Diagnosis not present

## 2021-04-05 LAB — POCT INR
INR: 2.4 (ref 2.0–3.0)
INR: 2.7 (ref 2.0–3.0)
PT: 28.5
PT: 32.2

## 2021-04-05 NOTE — Patient Instructions (Signed)
Description   ° No change 5 mg on Fridays and Mondays, 7.5 mg the rest of the days.  Recheck in 4 weeks.  ° °  °  °

## 2021-04-07 NOTE — Telephone Encounter (Signed)
Opened in error. KW °

## 2021-04-09 ENCOUNTER — Other Ambulatory Visit: Payer: Self-pay | Admitting: Physician Assistant

## 2021-05-03 ENCOUNTER — Ambulatory Visit (INDEPENDENT_AMBULATORY_CARE_PROVIDER_SITE_OTHER): Payer: Medicare Other

## 2021-05-03 ENCOUNTER — Other Ambulatory Visit: Payer: Self-pay

## 2021-05-03 DIAGNOSIS — I739 Peripheral vascular disease, unspecified: Secondary | ICD-10-CM

## 2021-05-03 LAB — POCT INR: INR: 2.6 (ref 2.0–3.0)

## 2021-05-03 NOTE — Patient Instructions (Signed)
Description   ° No change 5 mg on Fridays and Mondays, 7.5 mg the rest of the days.  Recheck in 4 weeks.  ° °  °  °

## 2021-05-31 ENCOUNTER — Ambulatory Visit: Payer: Medicare Other

## 2021-05-31 DIAGNOSIS — I739 Peripheral vascular disease, unspecified: Secondary | ICD-10-CM

## 2021-05-31 LAB — POCT INR
INR: 1.8 — AB (ref 2.0–3.0)
PT: 21.7

## 2021-05-31 NOTE — Patient Instructions (Signed)
Description   ?7.5 mg daily  Recheck in 1 week. ? ?  ?  ?

## 2021-06-07 ENCOUNTER — Encounter: Payer: Self-pay | Admitting: Family Medicine

## 2021-06-07 ENCOUNTER — Other Ambulatory Visit: Payer: Self-pay | Admitting: Family Medicine

## 2021-06-07 ENCOUNTER — Ambulatory Visit: Payer: Self-pay

## 2021-06-07 ENCOUNTER — Ambulatory Visit: Payer: Medicare Other

## 2021-06-07 ENCOUNTER — Ambulatory Visit (INDEPENDENT_AMBULATORY_CARE_PROVIDER_SITE_OTHER): Payer: Medicare Other | Admitting: Family Medicine

## 2021-06-07 VITALS — BP 100/58 | HR 62 | Temp 98.3°F | Resp 16 | Wt 200.0 lb

## 2021-06-07 DIAGNOSIS — J351 Hypertrophy of tonsils: Secondary | ICD-10-CM

## 2021-06-07 DIAGNOSIS — I739 Peripheral vascular disease, unspecified: Secondary | ICD-10-CM | POA: Diagnosis not present

## 2021-06-07 DIAGNOSIS — R051 Acute cough: Secondary | ICD-10-CM | POA: Diagnosis not present

## 2021-06-07 DIAGNOSIS — H65111 Acute and subacute allergic otitis media (mucoid) (sanguinous) (serous), right ear: Secondary | ICD-10-CM

## 2021-06-07 LAB — POCT RAPID STREP A (OFFICE): Rapid Strep A Screen: NEGATIVE

## 2021-06-07 LAB — POCT INR
INR: 2.4 (ref 2.0–3.0)
PT: 28.3

## 2021-06-07 MED ORDER — PREDNISONE 10 MG (21) PO TBPK: ORAL_TABLET | ORAL | 0 refills | Status: DC

## 2021-06-07 MED ORDER — WARFARIN SODIUM 5 MG PO TABS: 5.0000 mg | ORAL_TABLET | Freq: Every day | ORAL | 1 refills | Status: DC

## 2021-06-07 MED ORDER — LORATADINE 10 MG PO TABS: 10.0000 mg | ORAL_TABLET | Freq: Every day | ORAL | 11 refills | Status: DC

## 2021-06-07 MED ORDER — FLUTICASONE PROPIONATE 50 MCG/ACT NA SUSP: 2.0000 | Freq: Every day | NASAL | 6 refills | Status: DC

## 2021-06-07 MED ORDER — GUAIFENESIN-DM 100-10 MG/5ML PO SYRP
5.0000 mL | ORAL_SOLUTION | ORAL | 1 refills | Status: DC | PRN
Start: 2021-06-07 — End: 2021-10-30

## 2021-06-07 MED ORDER — AMOXICILLIN-POT CLAVULANATE 875-125 MG PO TABS: 1.0000 | ORAL_TABLET | Freq: Two times a day (BID) | ORAL | 0 refills | Status: DC

## 2021-06-07 NOTE — Assessment & Plan Note (Signed)
Acute, > 1 week hx ?Swelling noted to R glands and R tonsils ?Will treat today ?Advise continue antihistamines, adding Flonase to assist Claritin; avoid additional decongestants with use of prednisone ?RTC in 2 wks  ?Refer at that time to ENT if continues with no improvement  ?

## 2021-06-07 NOTE — Addendum Note (Signed)
Addended by: [name] T on: 06/07/2021 11:37 AM ? ? Modules accepted: Level of Service ? ?

## 2021-06-07 NOTE — Assessment & Plan Note (Signed)
Acute on chronic, stable ?Hx of tobacco use/abuse ?Non productive ?Not worse in AM ?Recommend use of medication PRN q4 hrs to assist ?LCTAB ?

## 2021-06-07 NOTE — Progress Notes (Signed)
?  ? ?I,Sulibeya S Dimas,acting as a scribe for Gwyneth Sprout, FNP.,have documented all relevant documentation on the behalf of Gwyneth Sprout, FNP,as directed by  Gwyneth Sprout, FNP while in the presence of Gwyneth Sprout, FNP. ? ? ?Established patient visit ? ? ?Patient: [name] [name]   DOB: 01/09/1946   76 y.o. Male  MRN: [id number] ?Visit Date: 06/07/2021 ? ?Today's healthcare provider: Gwyneth Sprout, FNP  ?Re Introduced to nurse practitioner role and practice setting.  All questions answered.  Discussed provider/patient relationship and expectations. ? ?Chief Complaint  ?Patient presents with  ? Sore Throat  ? ?Subjective  ?  ?HPI  ?Pattient here today C/O or sore throat, cough and feels like tonsil is swollen on the right side only x one week. Patient reports taking OTC Claritin D, reports mild symptom control. Patient denies any contact with strep. Also,due for INR repeat. ? ?Medications: ?Outpatient Medications Prior to Visit  ?Medication Sig  ? Aspirin Buf,CaCarb-MgCarb-MgO, 81 MG TABS Take 1 tablet by mouth daily.  ? atorvastatin (LIPITOR) 40 MG tablet TAKE 1 TABLET BY MOUTH DAILY. PLEASE KEEP UPCOMING APPT IN FEBRUARY 2023  ? Coenzyme Q10 (CO Q-10) 100 MG CAPS Take 100 capsules by mouth daily.  ? fenofibrate 54 MG tablet TAKE 1 TABLET BY MOUTH DAILY. PLEASE SCHEDULE AN APPT.WITH DR. Burt Knack FOR MORE REFILLS  ? metoprolol succinate (TOPROL XL) 25 MG 24 hr tablet Take 1 tablet (25 mg total) by mouth daily.  ? Multiple Vitamin (MULTIVITAMIN WITH MINERALS) TABS tablet Take 1 tablet by mouth daily.  ? Omega-3 Fatty Acids (FISH OIL TRIPLE STRENGTH) 1400 MG CAPS Take 2,800 mg by mouth 2 (two) times daily.  ? Saw Palmetto 450 MG CAPS Take 900 mg by mouth 2 (two) times daily.  ? [DISCONTINUED] warfarin (COUMADIN) 5 MG tablet TAKE 1 AND 1/2 TABLETS BY MOUTH ONCE DAILY EXCEPT TAKE 2 TABLETS ON MONDAY  ? ?No facility-administered medications prior to visit.  ? ? ?Review of Systems  ?Constitutional:  Negative for  chills and fever.  ?HENT:  Positive for sore throat and voice change. Negative for congestion, ear pain, postnasal drip, rhinorrhea, sinus pressure, sinus pain, sneezing and trouble swallowing.   ?Respiratory:  Positive for cough. Negative for shortness of breath and wheezing.   ?Cardiovascular:  Negative for chest pain.  ? ? ?  Objective  ?  ?BP (!) 100/58 (BP Location: Left Arm, Patient Position: Sitting, Cuff Size: Large)   Pulse 62   Temp 98.3 ?F (36.8 ?C) (Oral)   Resp 16   Wt 200 lb (90.7 kg)   SpO2 96%   BMI 25.68 kg/m?  ? ? ?Physical Exam ?Vitals and nursing note reviewed.  ?Constitutional:   ?   Appearance: Normal appearance. He is well-developed and overweight.  ?HENT:  ?   Head: Normocephalic and atraumatic.  ?   Right Ear: Swelling present. No drainage or tenderness. A middle ear effusion is present. Tympanic membrane is erythematous.  ?   Left Ear: Tympanic membrane and ear canal normal.  ?   Nose: Congestion present. No rhinorrhea.  ?   Mouth/Throat:  ?   Mouth: Mucous membranes are moist. No oral lesions.  ?   Pharynx: Pharyngeal swelling present. No oropharyngeal exudate or posterior oropharyngeal erythema.  ?   Tonsils: No tonsillar exudate or tonsillar abscesses. 2+ on the right. 1+ on the left.  ?Eyes:  ?   Pupils: Pupils are equal, round, and reactive to light.  ?  Neck:  ?   Thyroid: No thyromegaly.  ?Cardiovascular:  ?   Rate and Rhythm: Normal rate and regular rhythm.  ?   Pulses: Normal pulses.  ?   Heart sounds: Normal heart sounds.  ?Pulmonary:  ?   Effort: Pulmonary effort is normal.  ?   Breath sounds: Normal breath sounds.  ?Musculoskeletal:     ?   General: Normal range of motion.  ?   Cervical back: Normal range of motion and neck supple.  ?Skin: ?   General: Skin is warm and dry.  ?   Capillary Refill: Capillary refill takes less than 2 seconds.  ?Neurological:  ?   General: No focal deficit present.  ?   Mental Status: He is alert and oriented to person, place, and time. Mental  status is at baseline.  ?Psychiatric:     ?   Mood and Affect: Mood normal.     ?   Behavior: Behavior normal.  ?  ? ?Results for orders placed or performed in visit on 06/07/21  ?POCT rapid strep A  ?Result Value Ref Range  ? Rapid Strep A Screen Negative Negative  ?Results for orders placed or performed in visit on 06/07/21  ?POCT INR  ?Result Value Ref Range  ? INR 2.4 2.0 - 3.0  ? PT 28.3   ? ? Assessment & Plan  ?  ? ?Problem List Items Addressed This Visit   ? ?  ? Cardiovascular and Mediastinum  ? PAD (peripheral artery disease) ([place]) - Primary  ?  Chronic, stable ?INR close to range today, 2.4 ?Continue 7.5 mg daily Warfarin ?RTC in 2 wks ? ? ?  ?  ? Relevant Medications  ? warfarin (COUMADIN) 5 MG tablet  ?  ? Nervous and Auditory  ? Non-recurrent acute allergic otitis media of right ear  ?  Acute, > 1 week hx ?Swelling noted to R glands and R tonsils ?Will treat today ?Advise continue antihistamines, adding Flonase to assist Claritin; avoid additional decongestants with use of prednisone ?RTC in 2 wks  ?Refer at that time to ENT if continues with no improvement  ? ?  ?  ? Relevant Medications  ? amoxicillin-clavulanate (AUGMENTIN) 875-125 MG tablet  ? predniSONE (STERAPRED UNI-PAK 21 TAB) 10 MG (21) TBPK tablet  ? guaiFENesin-dextromethorphan (ROBITUSSIN DM) 100-10 MG/5ML syrup  ? loratadine (CLARITIN) 10 MG tablet  ? fluticasone (FLONASE) 50 MCG/ACT nasal spray  ?  ? Other  ? Acute cough  ?  Acute on chronic, stable ?Hx of tobacco use/abuse ?Non productive ?Not worse in AM ?Recommend use of medication PRN q4 hrs to assist ?LCTAB ? ?  ?  ? Relevant Medications  ? guaiFENesin-dextromethorphan (ROBITUSSIN DM) 100-10 MG/5ML syrup  ? Swollen tonsil  ?  Associated with R ear infection; co-treat with pred taper ?Negative rapid strep ?No culture at this time given exudate absence ?Acute, stable ? ?  ?  ? Relevant Medications  ? predniSONE (STERAPRED UNI-PAK 21 TAB) 10 MG (21) TBPK tablet  ? Other Relevant Orders   ? POCT rapid strep A (Completed)  ? ? ? ?Return in about 2 weeks (around 06/21/2021), or if symptoms worsen or fail to improve, for chonic disease management.  ?   ? ?I, Gwyneth Sprout, FNP, have reviewed all documentation for this visit. The documentation on 06/07/21 for the exam, diagnosis, procedures, and orders are all accurate and complete. ? ? ? ?Gwyneth Sprout, FNP  ?[place] ?[phone] (phone) ?  [phone] (fax) ? ?[facility] ?

## 2021-06-07 NOTE — Assessment & Plan Note (Signed)
Chronic, stable ?INR close to range today, 2.4 ?Continue 7.5 mg daily Warfarin ?RTC in 2 wks ? ?

## 2021-06-07 NOTE — Assessment & Plan Note (Signed)
Associated with R ear infection; co-treat with pred taper ?Negative rapid strep ?No culture at this time given exudate absence ?Acute, stable ?

## 2021-06-07 NOTE — Patient Instructions (Signed)
Description   7.5 mg daily  Recheck in 2 week.      

## 2021-06-07 NOTE — Telephone Encounter (Signed)
predniSONE (STERAPRED UNI-PAK 21 TAB) 10 MG (21) TBPK tablet 1 each 0 06/07/2021  ?Sig: With morning meal; take as written on package.  ?Sent to pharmacy as: predniSONE (STERAPRED UNI-PAK 21 TAB) 10 MG (21) Tablet Therapy Pack tablet  ? ?Alex from Pharm[facility] states fu as "with morning meal" is NOT as is written on Pack, which do you prefer for him to list? [phone]  ? ?Pharmacist, Alex, verifying instructions on Prednisone. "Take as written as written on package." ? ?Answer Assessment - Initial Assessment Questions ?1. REASON FOR CALL: "What is your main concern right now?" ?    Pharmacy verifying Prednisone instructions. ?2. ONSET: "When did the  start?" ?    N/a ?3. SEVERITY: "How bad is the ?" ?    N/a ?4. FEVER: "Do you have a fever?" ?    No ?5. OTHER SYMPTOMS: "Do you have any other new symptoms?" ?    N/a ?6. TREATMENTS AND RESPONSE: "What have you done so far to try to make this better? What medicines have you used?" ?    N/a ?7. PREGNANCY: "Is there any chance you are pregnant?" "When was your last menstrual period?" ?    N/a ? ?Protocols used: No Guideline Available-A-AH ? ?

## 2021-06-07 NOTE — Progress Notes (Signed)
PT was managed by Provider [name]. ?

## 2021-06-21 ENCOUNTER — Ambulatory Visit: Payer: Medicare Other

## 2021-06-21 DIAGNOSIS — I739 Peripheral vascular disease, unspecified: Secondary | ICD-10-CM

## 2021-06-21 LAB — POCT INR
INR: 3.5 — AB (ref 2.0–3.0)
PT: 42.1

## 2021-06-21 NOTE — Patient Instructions (Signed)
Description   7.5 mg daily  Recheck in 2 week.      

## 2021-07-05 ENCOUNTER — Ambulatory Visit: Payer: Medicare Other

## 2021-07-05 DIAGNOSIS — I739 Peripheral vascular disease, unspecified: Secondary | ICD-10-CM

## 2021-07-05 LAB — POCT INR
INR: 3.3 — AB (ref 2.0–3.0)
PT: 39.5

## 2021-07-05 NOTE — Patient Instructions (Signed)
Description   7.5 mg daily  Recheck in 4 week.      

## 2021-08-02 ENCOUNTER — Ambulatory Visit: Payer: Medicare Other

## 2021-08-02 DIAGNOSIS — I739 Peripheral vascular disease, unspecified: Secondary | ICD-10-CM | POA: Diagnosis not present

## 2021-08-02 LAB — POCT INR
INR: 3 (ref 2.0–3.0)
PT: 36.4

## 2021-08-02 NOTE — Patient Instructions (Signed)
Description   7.5 mg daily  Recheck in 4 week.      

## 2021-08-30 ENCOUNTER — Ambulatory Visit: Payer: Medicare Other

## 2021-08-30 DIAGNOSIS — I739 Peripheral vascular disease, unspecified: Secondary | ICD-10-CM

## 2021-08-30 LAB — POCT INR
INR: 3.2 — AB (ref 2.0–3.0)
PT: 38.2

## 2021-08-30 NOTE — Patient Instructions (Signed)
Description   7.5 mg daily  Recheck in 4 week.      

## 2021-09-27 ENCOUNTER — Ambulatory Visit: Payer: Medicare Other

## 2021-09-27 DIAGNOSIS — I739 Peripheral vascular disease, unspecified: Secondary | ICD-10-CM

## 2021-09-27 LAB — POCT INR
INR: 3.3 — AB (ref 2.0–3.0)
PT: 39.1

## 2021-09-27 NOTE — Patient Instructions (Signed)
Description   7.5 mg daily  Recheck in 4 week.      

## 2021-10-16 ENCOUNTER — Other Ambulatory Visit: Payer: Self-pay | Admitting: Family Medicine

## 2021-10-16 DIAGNOSIS — I739 Peripheral vascular disease, unspecified: Secondary | ICD-10-CM

## 2021-10-17 NOTE — Telephone Encounter (Signed)
Pt called to report that his Rx order is wrong, he says he takes 1.5 tablets a day (7.5 MG total). Pt says that he is completely out of his current supply today.  [facility] [address], [place] - [address]  [address] [place] [address]  Phone: [phone] Fax: [phone]

## 2021-10-20 ENCOUNTER — Other Ambulatory Visit: Payer: Self-pay | Admitting: *Deleted

## 2021-10-20 DIAGNOSIS — I739 Peripheral vascular disease, unspecified: Secondary | ICD-10-CM

## 2021-10-25 ENCOUNTER — Ambulatory Visit (INDEPENDENT_AMBULATORY_CARE_PROVIDER_SITE_OTHER): Payer: Medicare Other

## 2021-10-25 DIAGNOSIS — I739 Peripheral vascular disease, unspecified: Secondary | ICD-10-CM

## 2021-10-25 LAB — POCT INR
INR: 2.9 (ref 2.0–3.0)
PT: 34.5

## 2021-10-25 NOTE — Patient Instructions (Signed)
Description   7.5 mg daily  Recheck in 4 week.      

## 2021-10-30 ENCOUNTER — Ambulatory Visit (HOSPITAL_COMMUNITY)
Admission: RE | Admit: 2021-10-30 | Discharge: 2021-10-30 | Disposition: A | Discharge status: Home / Self Care | Payer: Medicare Other | Source: Ambulatory Visit | Attending: Surgery | Admitting: Surgery

## 2021-10-30 ENCOUNTER — Encounter: Payer: Self-pay | Admitting: Surgery

## 2021-10-30 ENCOUNTER — Ambulatory Visit: Payer: Medicare Other | Admitting: Surgery

## 2021-10-30 ENCOUNTER — Ambulatory Visit (INDEPENDENT_AMBULATORY_CARE_PROVIDER_SITE_OTHER)
Admission: RE | Admit: 2021-10-30 | Discharge: 2021-10-30 | Disposition: A | Discharge status: Home / Self Care | Payer: Medicare Other | Source: Ambulatory Visit | Attending: Surgery | Admitting: Surgery

## 2021-10-30 VITALS — BP 126/75 | HR 65 | Temp 98.1°F | Resp 20 | Ht 74.0 in | Wt 192.8 lb

## 2021-10-30 DIAGNOSIS — I739 Peripheral vascular disease, unspecified: Secondary | ICD-10-CM | POA: Diagnosis present

## 2021-10-30 DIAGNOSIS — I724 Aneurysm of artery of lower extremity: Secondary | ICD-10-CM | POA: Diagnosis not present

## 2021-10-30 NOTE — Progress Notes (Signed)
[facility] [place]  Patient name: [name] [name] MRN: [id number] DOB: 07-04-1945 Sex: male   REASON FOR VISIT:    Follow up  [name]:    [name] [name] is a 76 y.o. male who is back today for follow-up.  He has undergone the following procedures:               -October 2011: Endovascular aneurysm repair, with known type II endoleak             -October 2011: Endovascular repair of left popliteal aneurysm             -October 29, 2013: Femoral posterior tibial bypass graft: This was performed after failed attempted lytic therapy for occluded popliteal stents.  The bypass graft occluded early requiring reexploration.  The posterior tibial artery was opened behind the ankle and Fogarty catheters were able to be passed around the pedal arch.             -05/25/2014: Drug-coated balloon angioplasty to the distal anastomosis of his left leg bypass             -10/22/2017: Drug-coated balloon angioplasty, left distal anastomosis of his left leg bypass   The patient continues to take a statin for hypercholesterolemia.  He is on Coumadin. He feels like he is getting more feeling back in his left foot   PAST MEDICAL HISTORY:   Past Medical History:  Diagnosis Date   Abdominal aortic aneurysm ([place])    Chronic kidney disease    Right kidney stone   Coronary artery disease    Detached retina    Hyperlipidemia    Hypertension    Myocardial infarction [facility])    2007 heart stent   Popliteal aneurysm ([place])      FAMILY HISTORY:   Family History  Problem Relation Age of Onset   Aneurysm Sister        possibly    Peripheral vascular disease Brother     SOCIAL HISTORY:   Social History   Tobacco Use   Smoking status: Former    Years: 15.00    Types: Cigarettes    Quit date: 09/21/1985    Years since quitting: 36.1   Smokeless tobacco: Current    Types: Chew   Tobacco comments:    once a day  chews tobacco  Substance Use Topics   Alcohol use: Yes    Alcohol/week: 0.0 - 1.0 standard drinks of alcohol    Comment: 1 beer ocassionally (mostly in summer)     ALLERGIES:   Allergies  Allergen Reactions   Omnipaque [Iohexol] Rash and Other (See Comments)    Pt requires full premeds and needs to bring a driver.  Broke out in rash on back and arms.  Ok w/ premeds   Oxytetracycline Rash and Other (See Comments)    Terramycin causes rash   Alteplase Other (See Comments)    Internal bleeding, per RN in short stay. Patient requested this to be added to his allergy list     CURRENT MEDICATIONS:   Current Outpatient Medications  Medication Sig Dispense Refill   amoxicillin-clavulanate (AUGMENTIN) 875-125 MG tablet Take 1 tablet by mouth 2 (two) times daily. 14 tablet 0   Aspirin Buf,CaCarb-MgCarb-MgO, 81 MG TABS Take 1 tablet by mouth daily.     atorvastatin (LIPITOR) 40 MG tablet TAKE 1 TABLET BY MOUTH DAILY. PLEASE KEEP UPCOMING APPT IN FEBRUARY 2023 90 tablet 2  Coenzyme Q10 (CO Q-10) 100 MG CAPS Take 100 capsules by mouth daily.  0   fenofibrate 54 MG tablet TAKE 1 TABLET BY MOUTH DAILY. PLEASE SCHEDULE AN APPT.WITH DR. Burt Knack FOR MORE REFILLS 90 tablet 2   fluticasone (FLONASE) 50 MCG/ACT nasal spray Place 2 sprays into both nostrils daily. 16 g 6   guaiFENesin-dextromethorphan (ROBITUSSIN DM) 100-10 MG/5ML syrup Take 5 mLs by mouth every 4 (four) hours as needed for cough. 118 mL 1   loratadine (CLARITIN) 10 MG tablet Take 1 tablet (10 mg total) by mouth daily. 30 tablet 11   metoprolol succinate (TOPROL XL) 25 MG 24 hr tablet Take 1 tablet (25 mg total) by mouth daily. 90 tablet 3   Multiple Vitamin (MULTIVITAMIN WITH MINERALS) TABS tablet Take 1 tablet by mouth daily.     Omega-3 Fatty Acids (FISH OIL TRIPLE STRENGTH) 1400 MG CAPS Take 2,800 mg by mouth 2 (two) times daily.     predniSONE (STERAPRED UNI-PAK 21 TAB) 10 MG (21) TBPK tablet TAKE AS DIRECTED ON PKG 21 each 0    Saw Palmetto 450 MG CAPS Take 900 mg by mouth 2 (two) times daily.     warfarin (COUMADIN) 5 MG tablet Take 1.5 tablet every day 150 tablet 1   No current facility-administered medications for this visit.    REVIEW OF SYSTEMS:   [X]  denotes positive finding, [ ]  denotes negative finding Cardiac  Comments:  Chest pain or chest pressure:    Shortness of breath upon exertion:    Short of breath when lying flat:    Irregular heart rhythm:        Vascular    Pain in calf, thigh, or hip brought on by ambulation:    Pain in feet at night that wakes you up from your sleep:     Blood clot in your veins:    Leg swelling:         Pulmonary    Oxygen at home:    Productive cough:     Wheezing:         Neurologic    Sudden weakness in arms or legs:     Sudden numbness in arms or legs:     Sudden onset of difficulty speaking or slurred speech:    Temporary loss of vision in one eye:     Problems with dizziness:         Gastrointestinal    Blood in stool:     Vomited blood:         Genitourinary    Burning when urinating:     Blood in urine:        Psychiatric    Major depression:         Hematologic    Bleeding problems:    Problems with blood clotting too easily:        Skin    Rashes or ulcers:        Constitutional    Fever or chills:      PHYSICAL EXAM:   There were no vitals filed for this visit.  GENERAL: The patient is a well-nourished male, in no acute distress. The vital signs are documented above. CARDIAC: There is a regular rate and rhythm.  VASCULAR: non palpable pedal pulses PULMONARY: Non-labored respirations MUSCULOSKELETAL: There are no major deformities or cyanosis. NEUROLOGIC: No focal weakness or paresthesias are detected. SKIN: There are no ulcers or rashes noted. PSYCHIATRIC: The patient has a normal affect.  STUDIES:  I have reviewed the following: +-------+-----------+-----------+------------+------------+  ABI/TBIToday's ABIToday's  TBIPrevious ABIPrevious TBI  +-------+-----------+-----------+------------+------------+  Right  0.84       0.42       0.93        0.41          +-------+-----------+-----------+------------+------------+  Left   0.65       0.25       0.00        0.27          +-------+-----------+-----------+------------+------------+  Right toe pressure:63 Left toe pressure:37  Left: Widely patent femoral to posterior tibial artery bypass graft.  Velocities in the distal anastomosis suggest a 50-70% stenosis; however,  minimal disease is observed.  Unable to compare velocities to prior exam (distal anastomosis and outflow  artery were not visualized).     MEDICAL ISSUES:   Left leg bypass: Ultrasound today shows a distal anastomotic stenosis in the 50% range.  Max velocity was 217 cm/s.  There is been a slight drop in the ABIs.  I discussed following up on this in 6 months and if there has been further deterioration, he will need to undergo angiography.  AAA: He will get a ultrasound follow-up in 6 months for his EVAR.  It was last measured at 3 cm without endoleak  Right leg: He has a known popliteal aneurysm with maximum diameter of 1.7 cm.  This will be evaluated again in 6 months  Leia Alf, MD, FACS [facility] [phone] Pager [phone]

## 2021-11-01 ENCOUNTER — Other Ambulatory Visit: Payer: Self-pay

## 2021-11-01 DIAGNOSIS — I739 Peripheral vascular disease, unspecified: Secondary | ICD-10-CM

## 2021-11-01 DIAGNOSIS — I714 Abdominal aortic aneurysm, without rupture, unspecified: Secondary | ICD-10-CM

## 2021-11-22 ENCOUNTER — Ambulatory Visit: Payer: Medicare Other | Admitting: *Deleted

## 2021-11-22 DIAGNOSIS — I739 Peripheral vascular disease, unspecified: Secondary | ICD-10-CM | POA: Diagnosis not present

## 2021-11-22 LAB — POCT INR
INR: 3 (ref 2.0–3.0)
PT: 36.2

## 2021-11-22 NOTE — Patient Instructions (Signed)
Description   7.5 mg daily  Recheck in 4 week.      

## 2021-11-30 ENCOUNTER — Other Ambulatory Visit: Payer: Self-pay | Admitting: Cardiovascular Disease

## 2021-12-20 ENCOUNTER — Ambulatory Visit: Payer: Medicare Other | Admitting: Physician Assistant

## 2021-12-20 DIAGNOSIS — I739 Peripheral vascular disease, unspecified: Secondary | ICD-10-CM

## 2021-12-20 LAB — POCT INR
POC INR: 3
PT: 36.4

## 2021-12-20 NOTE — Patient Instructions (Signed)
Description   7.5 mg daily  Recheck in 4 week.      

## 2021-12-21 ENCOUNTER — Encounter: Payer: Self-pay | Admitting: Family Medicine

## 2021-12-21 ENCOUNTER — Ambulatory Visit: Payer: Medicare Other | Admitting: Family Medicine

## 2021-12-21 VITALS — BP 118/65 | HR 72 | Resp 17 | Ht 74.0 in | Wt 193.0 lb

## 2021-12-21 DIAGNOSIS — R7303 Prediabetes: Secondary | ICD-10-CM

## 2021-12-21 DIAGNOSIS — I739 Peripheral vascular disease, unspecified: Secondary | ICD-10-CM | POA: Diagnosis not present

## 2021-12-21 DIAGNOSIS — E785 Hyperlipidemia, unspecified: Secondary | ICD-10-CM

## 2021-12-21 DIAGNOSIS — I10 Essential (primary) hypertension: Secondary | ICD-10-CM | POA: Insufficient documentation

## 2021-12-21 DIAGNOSIS — R972 Elevated prostate specific antigen [PSA]: Secondary | ICD-10-CM

## 2021-12-21 DIAGNOSIS — R7989 Other specified abnormal findings of blood chemistry: Secondary | ICD-10-CM | POA: Insufficient documentation

## 2021-12-21 NOTE — Assessment & Plan Note (Signed)
Repeat PSA; on saw palmetto 900 mg BID to assist with LUTS

## 2021-12-21 NOTE — Progress Notes (Signed)
Established patient visit   Patient: [name] [name]   DOB: 1946-01-20   76 y.o. Male  MRN: [id number] Visit Date: 12/21/2021  Today's healthcare provider: Jacky Kindle, FNP  Re Introduced to nurse practitioner role and practice setting.  All questions answered.  Discussed provider/patient relationship and expectations.  I,Tiffany J Bragg,acting as a scribe for Jacky Kindle, FNP.,have documented all relevant documentation on the behalf of Jacky Kindle, FNP,as directed by  Jacky Kindle, FNP while in the presence of Jacky Kindle, FNP.  Subjective    HPI  Follow up for PAD  The patient was last seen for this 7 months ago. Changes made at last visit include continue 7.5 daily warfarin.  He reports excellent compliance with treatment. He feels that condition is Unchanged. He is not having side effects.   Lipid/Cholesterol, Follow-up  Last lipid panel Other pertinent labs  Lab Results  Component Value Date   CHOL 130 11/16/2020   HDL 31 (L) 11/16/2020   LDLCALC 71 11/16/2020   TRIG 160 (H) 11/16/2020   CHOLHDL 4.2 11/16/2020   Lab Results  Component Value Date   ALT 22 08/16/2020   AST 25 08/16/2020   PLT 240 02/10/2020   TSH 5.040 (H) 02/10/2020     He was last seen for this 7 months ago.  Management since that visit includes atorvastatin and fenofibrate.  He reports excellent compliance with treatment. He is not having side effects.   Symptoms: No chest pain No chest pressure/discomfort  No dyspnea No lower extremity edema  No numbness or tingling of extremity No orthopnea  No palpitations No paroxysmal nocturnal dyspnea  No speech difficulty No syncope   Current diet: well balanced Current exercise: housecleaning and walking  The 10-year ASCVD risk score (Arnett DK, et al., 2019) is: 30.5%  ---------------------------------------------------------------------------------------------------   -----------------------------------------------------------------------------------------   Medications: Outpatient Medications Prior to Visit  Medication Sig   Aspirin Buf,CaCarb-MgCarb-MgO, 81 MG TABS Take 1 tablet by mouth daily.   atorvastatin (LIPITOR) 40 MG tablet TAKE 1 TABLET BY MOUTH DAILY. PLEASE KEEP UPCOMING APPT IN FEBRUARY 2023   Coenzyme Q10 (CO Q-10) 100 MG CAPS Take 100 capsules by mouth daily.   fenofibrate 54 MG tablet TAKE 1 TABLET BY MOUTH DAILY. PLEASE SCHEDULE AN APPT.WITH DR. Excell Seltzer FOR MORE REFILLS   fluticasone (FLONASE) 50 MCG/ACT nasal spray Place 2 sprays into both nostrils daily.   metoprolol succinate (TOPROL XL) 25 MG 24 hr tablet Take 1 tablet (25 mg total) by mouth daily.   Multiple Vitamin (MULTIVITAMIN WITH MINERALS) TABS tablet Take 1 tablet by mouth daily.   Omega-3 Fatty Acids (FISH OIL TRIPLE STRENGTH) 1400 MG CAPS Take 2,800 mg by mouth 2 (two) times daily.   Saw Palmetto 450 MG CAPS Take 900 mg by mouth 2 (two) times daily.   warfarin (COUMADIN) 5 MG tablet Take 1.5 tablet every day   No facility-administered medications prior to visit.    Review of Systems    Objective    BP 118/65 (BP Location: Left Arm, Patient Position: Sitting, Cuff Size: Large)   Pulse 72   Resp 17   Ht 6\' 2"  (1.88 m)   Wt 193 lb (87.5 kg)   SpO2 97%   BMI 24.78 kg/m   Physical Exam Vitals and nursing note reviewed.  Constitutional:      Appearance: Normal appearance. He is normal weight.  HENT:     Head: Normocephalic and atraumatic.  Eyes:     Pupils: Pupils are equal, round, and reactive to light.  Cardiovascular:     Rate and Rhythm: Normal rate and regular rhythm.     Pulses: Normal pulses.     Heart sounds: Normal heart sounds.  Pulmonary:     Effort: Pulmonary effort is normal.     Breath sounds: Normal breath sounds.  Musculoskeletal:        General: No swelling. Normal range of motion.     Cervical back: Normal range of motion.     Right  lower leg: No edema.     Left lower leg: No edema.  Skin:    General: Skin is warm and dry.     Capillary Refill: Capillary refill takes less than 2 seconds.  Neurological:     General: No focal deficit present.     Mental Status: He is alert and oriented to person, place, and time. Mental status is at baseline.  Psychiatric:        Mood and Affect: Mood normal.        Behavior: Behavior normal.        Thought Content: Thought content normal.        Judgment: Judgment normal.     No results found for any visits on 12/21/21.  Assessment & Plan     Problem List Items Addressed This Visit       Cardiovascular and Mediastinum   Essential hypertension    Chronic, stable Goal <130/<80 Continue toprol 25 mg daily At goal      Relevant Orders   CBC   Comprehensive Metabolic Panel (CMET)   PAD (peripheral artery disease) ([facility]) - Primary    Chronic, stable Continue to recommend LDL goal of 55-70 Denies numbness, tingling Remains on atorvastatin 40 mg and fenofibrate 54 mg Body mass index is 24.78 kg/m.       Relevant Orders   Lipid Panel With LDL/HDL Ratio     Other   Elevated PSA    Repeat PSA; on saw palmetto 900 mg BID to assist with LUTS      Relevant Orders   PSA   Elevated TSH    Denies symptoms at this time Repeat labs, TSH only       Relevant Orders   TSH   Hyperlipidemia    Chronic, previously borderline Hx of PAD  Repeat LP      Relevant Orders   Lipid Panel With LDL/HDL Ratio   Prediabetes    Chronic, controlled with lifestyle Repeat A1c      Relevant Orders   Hemoglobin A1c   Return in about 10 weeks (around 03/01/2022) for annual examination.     Leilani Merl, FNP, have reviewed all documentation for this visit. The documentation on 12/21/21 for the exam, diagnosis, procedures, and orders are all accurate and complete.  Jacky Kindle, FNP  [facility] [phone] (phone) [phone] (fax)  [facility]

## 2021-12-21 NOTE — Assessment & Plan Note (Signed)
Chronic, previously borderline Hx of PAD  Repeat LP

## 2021-12-21 NOTE — Assessment & Plan Note (Signed)
Chronic, controlled with lifestyle Repeat A1c

## 2021-12-21 NOTE — Assessment & Plan Note (Signed)
Denies symptoms at this time Repeat labs, TSH only

## 2021-12-21 NOTE — Assessment & Plan Note (Signed)
Chronic, stable Goal <130/<80 Continue toprol 25 mg daily At goal

## 2021-12-21 NOTE — Assessment & Plan Note (Signed)
Chronic, stable Continue to recommend LDL goal of 55-70 Denies numbness, tingling Remains on atorvastatin 40 mg and fenofibrate 54 mg Body mass index is 24.78 kg/m.

## 2021-12-26 LAB — COMPREHENSIVE METABOLIC PANEL
ALT: 15 IU/L (ref 0–44)
AST: 18 IU/L (ref 0–40)
Albumin/Globulin Ratio: 1.5 (ref 1.2–2.2)
Albumin: 4.2 g/dL (ref 3.8–4.8)
Alkaline Phosphatase: 103 IU/L (ref 44–121)
BUN/Creatinine Ratio: 12 (ref 10–24)
BUN: 16 mg/dL (ref 8–27)
Bilirubin Total: 0.9 mg/dL (ref 0.0–1.2)
CO2: 22 mmol/L (ref 20–29)
Calcium: 9.4 mg/dL (ref 8.6–10.2)
Chloride: 109 mmol/L — ABNORMAL HIGH (ref 96–106)
Creatinine, Ser: 1.29 mg/dL — ABNORMAL HIGH (ref 0.76–1.27)
Globulin, Total: 2.8 g/dL (ref 1.5–4.5)
Glucose: 104 mg/dL — ABNORMAL HIGH (ref 70–99)
Potassium: 4.4 mmol/L (ref 3.5–5.2)
Sodium: 142 mmol/L (ref 134–144)
Total Protein: 7 g/dL (ref 6.0–8.5)
eGFR: 57 mL/min/{1.73_m2} — ABNORMAL LOW (ref >59)

## 2021-12-26 LAB — CBC
Hematocrit: 42.8 % (ref 37.5–51.0)
Hemoglobin: 14.5 g/dL (ref 13.0–17.7)
MCH: 29.6 pg (ref 26.6–33.0)
MCHC: 33.9 g/dL (ref 31.5–35.7)
MCV: 87 fL (ref 79–97)
Platelets: 322 10*3/uL (ref 150–450)
RBC: 4.9 x10E6/uL (ref 4.14–5.80)
RDW: 14.3 % (ref 11.6–15.4)
WBC: 7.4 10*3/uL (ref 3.4–10.8)

## 2021-12-26 LAB — LIPID PANEL WITH LDL/HDL RATIO
Cholesterol, Total: 122 mg/dL (ref 100–199)
HDL: 29 mg/dL — ABNORMAL LOW (ref >39)
LDL Chol Calc (NIH): 67 mg/dL (ref 0–99)
LDL/HDL Ratio: 2.3 ratio (ref 0.0–3.6)
Triglycerides: 145 mg/dL (ref 0–149)
VLDL Cholesterol Cal: 26 mg/dL (ref 5–40)

## 2021-12-26 LAB — TSH: TSH: 4.43 u[IU]/mL (ref 0.450–4.500)

## 2021-12-26 LAB — PSA: Prostate Specific Ag, Serum: 10.2 ng/mL — ABNORMAL HIGH (ref 0.0–4.0)

## 2021-12-27 ENCOUNTER — Other Ambulatory Visit: Payer: Self-pay | Admitting: Family Medicine

## 2021-12-27 DIAGNOSIS — R972 Elevated prostate specific antigen [PSA]: Secondary | ICD-10-CM

## 2021-12-27 NOTE — Progress Notes (Signed)
Cholesterol shows improved fats, stable low "good" cholesterol and stable "bad cholesterol" I continue to recommend diet low in saturated fat and regular exercise - 30 min at least 5 times per week  Slight elevation noted in creatinine; ensure proper hydration.   PSA remains quite elevated; recommend repeat labs and referral to urology.

## 2021-12-30 ENCOUNTER — Other Ambulatory Visit: Payer: Self-pay | Admitting: Cardiovascular Disease

## 2022-01-11 ENCOUNTER — Encounter: Payer: Self-pay | Admitting: Urology

## 2022-01-11 ENCOUNTER — Ambulatory Visit: Payer: Medicare Other | Admitting: Urology

## 2022-01-11 VITALS — BP 137/72 | HR 80 | Ht 74.0 in | Wt 195.0 lb

## 2022-01-11 DIAGNOSIS — R972 Elevated prostate specific antigen [PSA]: Secondary | ICD-10-CM

## 2022-01-11 DIAGNOSIS — R3911 Hesitancy of micturition: Secondary | ICD-10-CM | POA: Diagnosis not present

## 2022-01-11 DIAGNOSIS — N401 Enlarged prostate with lower urinary tract symptoms: Secondary | ICD-10-CM

## 2022-01-11 NOTE — Progress Notes (Signed)
01/11/2022 10:29 AM   [name] [birth year] [id number]  Referring provider: Jacky Kindle, FNP [address] [place],  [place] [address]  Chief Complaint  Patient presents with   Elevated PSA    HPI: [name] [name] is a 76 y.o. male referred for evaluation of an elevated PSA.  PSA 12/25/2021 elevated at 10.2 Record review remarkable for prior PSA results of 6.15 January 2017  Mild urinary hesitancy and weak urinary stream on saw palmetto No family history prostate cancer CT without contrast 01/2021 showed an indeterminate 1.6 cm left renal lesion.  A follow-up renal mass protocol MRI showed this to be a Bosniak 2 cyst.  Incidentally noted was significant prostate enlargement Prior history stone disease   PMH: Past Medical History:  Diagnosis Date   Abdominal aortic aneurysm (HCC)    Chronic kidney disease    Right kidney stone   Coronary artery disease    Detached retina    Hyperlipidemia    Hypertension    Myocardial infarction [facility])    2007 heart stent   Popliteal aneurysm [facility])     Surgical History: Past Surgical History:  Procedure Laterality Date   ABDOMINAL AORTIC ANEURYSM REPAIR  11/24/09   EVAR   ABDOMINAL AORTOGRAM W/LOWER EXTREMITY N/A 10/22/2017   Procedure: ABDOMINAL AORTOGRAM W/LOWER EXTREMITY;  Surgeon: Nada Libman, MD;  Location: [facility] INVASIVE CV LAB;  Service: Cardiovascular;  Laterality: N/A;   AMPUTATION Left 12/10/2013   Procedure: AMPUTATION LEFT  FOURTH AND FIFTH TOES;  Surgeon: Nada Libman, MD;  Location: [facility] OR;  Service: Vascular;  Laterality: Left;   COLONOSCOPY     coronary stenting     2007   EYE SURGERY Right    cataract removed   FEMORAL-POPLITEAL BYPASS GRAFT Left 10/29/2013   Procedure: BYPASS GRAFT FEMORAL-POPLITEAL ARTERY;  Surgeon: Nada Libman, MD;  Location: [facility] OR;  Service: Vascular;  Laterality: Left;  Left Femoral to Posterior Tibial artery bypass graft using nonreversed saphenous vein. and  thrombectomy of Tibial arterly.   FEMORAL-TIBIAL BYPASS GRAFT Left 10/30/2013   Procedure: Reexploration of Femoral-Posterior Tibial Bypass; Thrombectomy of Bypass; Left Leg Angiogram; Posterior Tibial Vein Patch Angioplasty;  Surgeon: Nada Libman, MD;  [facility];  Service: Vascular;  Laterality: Left;   LOWER EXTREMITY ANGIOGRAM N/A 05/25/2014   Procedure: LOWER EXTREMITY ANGIOGRAM;  Surgeon: Nada Libman, MD;  Location: [facility] CATH LAB;  Service: Cardiovascular;  Laterality: N/A;   PERIPHERAL VASCULAR BALLOON ANGIOPLASTY Left 10/22/2017   Procedure: PERIPHERAL VASCULAR BALLOON ANGIOPLASTY;  Surgeon: Nada Libman, MD;  Location: [facility] INV[facility] CV LAB;  Service: Cardiovascular;  Laterality: Left;  fem pop bypass   PERIPHERAL VASCULAR CATHETERIZATION Left 10/04/2015   Procedure: Lower Extremity Angiography;  Surgeon: Nada Libman, MD;  Location: [facility] INVASIVE CV LAB;  Service: Cardiovascular;  Laterality: Left;   PERIPHERAL VASCULAR CATHETERIZATION Left 10/04/2015   Procedure: Peripheral Vascular Intervention;  Surgeon: Nada Libman, MD;  Location: [facility] INVASIVE CV LAB;  Service: Cardiovascular;  Laterality: Left;   POPLITEAL ARTERY STENT  11/2009   left popliteal artery    RETINAL DETACHMENT SURGERY Right 2011   TOOTH EXTRACTION  June 2015   Wisdom Tooth Extraction    Home Medications:  Allergies as of 01/11/2022       Reactions   Omnipaque [iohexol] Rash, Other (See Comments)   Pt requires full premeds and needs to bring a driver.  Broke out in rash on back and arms.  Ok  w/ premeds   Oxytetracycline Rash, Other (See Comments)   Terramycin causes rash   Alteplase Other (See Comments)   Internal bleeding, per RN in short stay. Patient requested this to be added to his allergy list        Medication List        Accurate as of January 11, 2022 10:29 AM. If you have any questions, ask your nurse or doctor.          Aspirin Buf(CaCarb-MgCarb-MgO) 81 MG Tabs Take 1 tablet  by mouth daily.   atorvastatin 40 MG tablet Commonly known as: LIPITOR Take 1 tablet (40 mg total) by mouth daily.   Co Q-10 100 MG Caps Take 100 capsules by mouth daily.   fenofibrate 54 MG tablet TAKE 1 TABLET BY MOUTH DAILY. PLEASE SCHEDULE AN APPT.WITH DR. Excell Seltzer FOR MORE REFILLS   Fish Oil Triple Strength 1400 MG Caps Take 2,800 mg by mouth 2 (two) times daily.   fluticasone 50 MCG/ACT nasal spray Commonly known as: FLONASE Place 2 sprays into both nostrils daily.   metoprolol succinate 25 MG 24 hr tablet Commonly known as: Toprol XL Take 1 tablet (25 mg total) by mouth daily.   multivitamin with minerals Tabs tablet Take 1 tablet by mouth daily.   Saw Palmetto 450 MG Caps Take 900 mg by mouth 2 (two) times daily.   warfarin 5 MG tablet Commonly known as: COUMADIN Take as directed by the anticoagulation clinic. If you are unsure how to take this medication, talk to your nurse or doctor. Original instructions: Take 1.5 tablet every day        Allergies:  Allergies  Allergen Reactions   Omnipaque [Iohexol] Rash and Other (See Comments)    Pt requires full premeds and needs to bring a driver.  Broke out in rash on back and arms.  Ok w/ premeds   Oxytetracycline Rash and Other (See Comments)    Terramycin causes rash   Alteplase Other (See Comments)    Internal bleeding, per RN in short stay. Patient requested this to be added to his allergy list    Family History: Family History  Problem Relation Age of Onset   Aneurysm Sister        possibly    Peripheral vascular disease Brother     Social History:  reports that he quit smoking about 36 years ago. His smoking use included cigarettes. His smokeless tobacco use includes chew. He reports current alcohol use. He reports that he does not use drugs.   Physical Exam: BP 137/72   Pulse 80   Ht 6\' 2"  (1.88 m)   Wt 195 lb (88.5 kg)   BMI 25.04 kg/m   Constitutional:  Alert and oriented, No acute  distress. HEENT: [place] AT Respiratory: Normal respiratory effort, no increased work of breathing. GI: Abdomen is soft, nontender, nondistended, no abdominal masses GU: Prostate 80+ cc, smooth without nodules.  Asymmetric prostate L >R the consistency is normal and uniform throughout Skin: No rashes, bruises or suspicious lesions. Psychiatric: Normal mood and affect.   Pertinent Imaging: Images of CT from 02/10/2021 were personally reviewed and interpreted.  Prostate volume calculated at 245 g   Assessment & Plan:    1.  Elevated PSA Benign DRE Although PSA is a prostate cancer screening test he was informed that cancer is not the most common cause of an elevated PSA. Other potential causes including BPH and inflammation were discussed.  He has significant prostate enlargement which  could certainly be the cause of his elevated PSA.  He was informed that the only way to adequately diagnose prostate cancer would be a transrectal ultrasound and biopsy of the prostate. The procedure was discussed including potential risks of bleeding and infection/sepsis. He was also informed that a negative biopsy does not conclusively rule out the possibility that prostate cancer may be present and that continued monitoring is required. The use of multiparametric prostate MRI to evaluate for lesions suspicious for high-grade prostate cancer and to aid in targeted biopsy was reviewed. Continued periodic surveillance was also discussed. He would initially like to have his PSA repeated and offered to schedule him a follow-up lab visit in 1 month though he would prefer to have this drawn at [facility]    Riki Altes, MD  [facility] [address], [address] [place], [place] [address] [phone]

## 2022-01-17 ENCOUNTER — Ambulatory Visit (INDEPENDENT_AMBULATORY_CARE_PROVIDER_SITE_OTHER): Payer: Medicare Other

## 2022-01-17 DIAGNOSIS — I739 Peripheral vascular disease, unspecified: Secondary | ICD-10-CM

## 2022-01-17 LAB — POCT INR: INR: 3.2 — AB (ref 2.0–3.0)

## 2022-01-26 LAB — PSA: Prostate Specific Ag, Serum: 8.9 ng/mL — ABNORMAL HIGH (ref 0.0–4.0)

## 2022-01-26 NOTE — Progress Notes (Signed)
PSA remains elevated; continue to recommend consult with urology.

## 2022-01-29 ENCOUNTER — Telehealth: Payer: Self-pay | Admitting: Urology

## 2022-01-29 DIAGNOSIS — R972 Elevated prostate specific antigen [PSA]: Secondary | ICD-10-CM

## 2022-01-29 NOTE — Telephone Encounter (Signed)
Notified patient as instructed, patient would like to have the prostate mri done.

## 2022-01-29 NOTE — Telephone Encounter (Signed)
Repeat PSA remains persistently elevated at 8.9.  We had discussed prostate biopsy, MRI and surveillance.  Please see if he has any questions regarding these options.

## 2022-01-30 NOTE — Addendum Note (Signed)
Addended by: Riki Altes on: 01/30/2022 07:38 AM   Modules accepted: Orders

## 2022-02-09 ENCOUNTER — Telehealth: Payer: Self-pay

## 2022-02-09 ENCOUNTER — Other Ambulatory Visit: Payer: Self-pay | Admitting: Family Medicine

## 2022-02-09 MED ORDER — PREDNISONE 50 MG PO TABS: ORAL_TABLET | ORAL | 0 refills | Status: DC

## 2022-02-09 MED ORDER — DIPHENHYDRAMINE HCL 25 MG PO CAPS: 25.0000 mg | ORAL_CAPSULE | Freq: Once | ORAL | 0 refills | Status: DC

## 2022-02-09 NOTE — Telephone Encounter (Signed)
Copied from CRM [id number]. Topic: General - Inquiry >> Feb 09, 2022  2:33 PM [name] wrote: Pt called saying [facility] wants [name] to recommend a dye for pt's test he will be having for prostate imaging.  He told them he had an allergic reaction to the dye the last time the imaging gave it to him.  CB#  [phone]  or [phone]

## 2022-02-14 ENCOUNTER — Ambulatory Visit: Payer: Medicare Other

## 2022-02-14 DIAGNOSIS — I739 Peripheral vascular disease, unspecified: Secondary | ICD-10-CM | POA: Diagnosis not present

## 2022-02-14 LAB — POCT INR
INR: 2.4 (ref 2.0–3.0)
POC INR: 2.4
PT: 29.1

## 2022-02-14 NOTE — Patient Instructions (Signed)
Description   7.5 mg daily  Recheck in 2 week.      

## 2022-02-21 ENCOUNTER — Telehealth: Payer: Self-pay | Admitting: Urology
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
## 2022-02-26 ENCOUNTER — Telehealth: Payer: Self-pay | Admitting: Urology

## 2022-02-26 NOTE — Telephone Encounter (Signed)
Spoke to pt about him make an appt for he's MRI. Pt sttd that he didn't want to get it done because he was allergic to dye but sttd to pt that it was two different kinds of dye.Pt would like to speak to provider in reference to this please.

## 2022-03-02 ENCOUNTER — Ambulatory Visit (INDEPENDENT_AMBULATORY_CARE_PROVIDER_SITE_OTHER): Payer: Medicare Other | Admitting: Family Medicine

## 2022-03-02 ENCOUNTER — Encounter: Payer: Self-pay | Admitting: Family Medicine

## 2022-03-02 VITALS — BP 108/62 | HR 85 | Temp 98.4°F | Ht 74.0 in | Wt 196.6 lb

## 2022-03-02 DIAGNOSIS — F17220 Nicotine dependence, chewing tobacco, uncomplicated: Secondary | ICD-10-CM

## 2022-03-02 DIAGNOSIS — I10 Essential (primary) hypertension: Secondary | ICD-10-CM

## 2022-03-02 DIAGNOSIS — Z0000 Encounter for general adult medical examination without abnormal findings: Secondary | ICD-10-CM

## 2022-03-02 DIAGNOSIS — I739 Peripheral vascular disease, unspecified: Secondary | ICD-10-CM

## 2022-03-02 DIAGNOSIS — R972 Elevated prostate specific antigen [PSA]: Secondary | ICD-10-CM

## 2022-03-02 DIAGNOSIS — E785 Hyperlipidemia, unspecified: Secondary | ICD-10-CM

## 2022-03-02 DIAGNOSIS — R7303 Prediabetes: Secondary | ICD-10-CM

## 2022-03-02 DIAGNOSIS — I714 Abdominal aortic aneurysm, without rupture, unspecified: Secondary | ICD-10-CM | POA: Diagnosis not present

## 2022-03-02 DIAGNOSIS — Z0001 Encounter for general adult medical examination with abnormal findings: Secondary | ICD-10-CM

## 2022-03-02 NOTE — Assessment & Plan Note (Signed)
Chronic, stable Followed by vascular and cardiac specialist On statin- lipitor at 40 mg; LP reviewed; at goal Continues to use tobacco, chewing; declines cessation HTN well controlled- continue on toprol 25

## 2022-03-02 NOTE — Assessment & Plan Note (Signed)
Chronic, stable Denies current concerns Continues to follow with vascular; remains on statin, pre-diabetic, good BP control  

## 2022-03-02 NOTE — Assessment & Plan Note (Signed)
Due for vision UTD on dentist Reports glasses are well functioning Denies falls or injuries Continues to follow up with multiple specialists No concern for neuro/cognitive deficits Denies CCM

## 2022-03-02 NOTE — Assessment & Plan Note (Signed)
Chronic, stable, at goal <130/<80 Continue Toprol 25  F/b cardiology

## 2022-03-02 NOTE — Assessment & Plan Note (Signed)
Chronic, stable Denies current concerns Continues to follow with vascular; remains on statin, pre-diabetic, good BP control

## 2022-03-02 NOTE — Progress Notes (Signed)
I,Connie R Striblin,acting as a Neurosurgeonscribe for Jacky KindleElise T Raimundo Corbit, FNP.,have documented all relevant documentation on the behalf of Jacky Kindlelise T Captola Teschner, FNP,as directed by  Jacky KindleElise T Sabastian Raimondi, FNP while in the presence of Jacky KindleElise T Malkia Nippert, FNP.  Annual Wellness Visit  Patient: [name] [name], Male    DOB: 09/04/1945, 77 y.o.   MRN: [id number] Visit Date: 03/02/2022  Today's Provider: Jacky KindleElise T Myya Meenach, FNP  Re Introduced to nurse practitioner role and practice setting.  All questions answered.  Discussed provider/patient relationship and expectations.  Chief Complaint  Patient presents with   Annual Exam   Subjective    [name] Heritagehomas E Mendonsa is a 77 y.o. male who presents today for his Annual Wellness Visit. He reports consuming a general diet. The patient does not participate in regular exercise at present. He generally feels fairly well. He reports sleeping well. He does have additional problems to discuss today.   HPI Pt would like to discuss his previous referral to the urologist    Medications: Outpatient Medications Prior to Visit  Medication Sig   Aspirin Buf,CaCarb-MgCarb-MgO, 81 MG TABS Take 1 tablet by mouth daily.   atorvastatin (LIPITOR) 40 MG tablet Take 1 tablet (40 mg total) by mouth daily.   Coenzyme Q10 (CO Q-10) 100 MG CAPS Take 100 capsules by mouth daily.   diphenhydrAMINE (BENADRYL ALLERGY) 25 mg capsule Take 1 capsule (25 mg total) by mouth once for 1 dose. Take with 50 mg prednisone (3rd dose) 1 hour prior to imaging.   fenofibrate 54 MG tablet TAKE 1 TABLET BY MOUTH DAILY. PLEASE SCHEDULE AN APPT.WITH DR. Excell SeltzerOOPER FOR MORE REFILLS   fluticasone (FLONASE) 50 MCG/ACT nasal spray Place 2 sprays into both nostrils daily.   metoprolol succinate (TOPROL XL) 25 MG 24 hr tablet Take 1 tablet (25 mg total) by mouth daily.   Multiple Vitamin (MULTIVITAMIN WITH MINERALS) TABS tablet Take 1 tablet by mouth daily.   Omega-3 Fatty Acids (FISH OIL TRIPLE STRENGTH) 1400 MG CAPS Take 2,800 mg by mouth  2 (two) times daily.   predniSONE (DELTASONE) 50 MG tablet Take 1st tablet PO 13 hours prior to imaging, take 2nd tablet PO 7 hours prior to imaging, and take 3rd tablet PO 1 hour prior to imaging.   Saw Palmetto 450 MG CAPS Take 900 mg by mouth 2 (two) times daily.   warfarin (COUMADIN) 5 MG tablet Take 1.5 tablet every day   No facility-administered medications prior to visit.    Allergies  Allergen Reactions   Omnipaque [Iohexol] Rash and Other (See Comments)    Pt requires full premeds and needs to bring a driver.  Broke out in rash on back and arms.  Ok w/ premeds   Oxytetracycline Rash and Other (See Comments)    Terramycin causes rash   Alteplase Other (See Comments)    Internal bleeding, per RN in short stay. Patient requested this to be added to his allergy list    Patient Care Team: Jacky KindlePayne, Aiyla Baucom T, FNP as PCP - General (Family Medicine) Tonny Bollmanooper, Michael, MD as PCP - Cardiology (Cardiology) Nada LibmanBrabham, Vance W, MD as Consulting Physician (Vascular Surgery) [facility], [facility]  Review of Systems      Objective    Vitals: BP 108/62 (BP Location: Right Arm, Patient Position: Sitting, Cuff Size: Normal)   Pulse 85   Temp 98.4 F (36.9 C) (Oral)   Ht 6\' 2"  (1.88 m)   Wt 196 lb 9.6 oz (89.2 kg)   SpO2  96%   BMI 25.24 kg/m     Physical Exam Vitals and nursing note reviewed.  Constitutional:      General: He is awake. He is not in acute distress.    Appearance: Normal appearance. He is well-developed, well-groomed and overweight. He is not ill-appearing, toxic-appearing or diaphoretic.  HENT:     Head: Normocephalic and atraumatic.     Jaw: There is normal jaw occlusion. No trismus, tenderness, swelling or pain on movement.     Salivary Glands: Right salivary gland is not diffusely enlarged or tender. Left salivary gland is not diffusely enlarged or tender.     Right Ear: Hearing, tympanic membrane, ear canal and external ear normal. There is no impacted cerumen.      Left Ear: Hearing, tympanic membrane, ear canal and external ear normal. There is no impacted cerumen.     Nose: Nose normal. No congestion or rhinorrhea.     Right Turbinates: Not enlarged, swollen or pale.     Left Turbinates: Not enlarged, swollen or pale.     Right Sinus: No maxillary sinus tenderness or frontal sinus tenderness.     Left Sinus: No maxillary sinus tenderness or frontal sinus tenderness.     Mouth/Throat:     Lips: Pink.     Mouth: Mucous membranes are moist. No injury, lacerations, oral lesions or angioedema.     Pharynx: Oropharynx is clear. Uvula midline. No pharyngeal swelling, oropharyngeal exudate or posterior oropharyngeal erythema.     Tonsils: No tonsillar exudate or tonsillar abscesses.  Eyes:     General: Lids are normal. Vision grossly intact. Gaze aligned appropriately.        Right eye: No discharge.        Left eye: No discharge.     Extraocular Movements: Extraocular movements intact.     Conjunctiva/sclera: Conjunctivae normal.     Pupils: Pupils are equal, round, and reactive to light.  Neck:     Thyroid: No thyroid mass, thyromegaly or thyroid tenderness.     Vascular: No carotid bruit.     Trachea: Trachea normal. No tracheal tenderness.  Cardiovascular:     Rate and Rhythm: Normal rate and regular rhythm.     Pulses: Normal pulses.          Carotid pulses are 2+ on the right side and 2+ on the left side.      Radial pulses are 2+ on the right side and 2+ on the left side.       Femoral pulses are 2+ on the right side and 2+ on the left side.      Popliteal pulses are 2+ on the right side and 2+ on the left side.       Dorsalis pedis pulses are 2+ on the right side and 2+ on the left side.       Posterior tibial pulses are 2+ on the right side and 2+ on the left side.     Heart sounds: Normal heart sounds, S1 normal and S2 normal. No murmur heard.    No friction rub. No gallop.  Pulmonary:     Effort: Pulmonary effort is normal. No  respiratory distress.     Breath sounds: Normal breath sounds and air entry. No stridor. No wheezing, rhonchi or rales.  Chest:     Chest wall: No tenderness.  Abdominal:     General: Abdomen is flat. Bowel sounds are normal. There is no distension.     Palpations: Abdomen  is soft. There is no mass.     Tenderness: There is no abdominal tenderness. There is no guarding or rebound.     Hernia: No hernia is present.  Genitourinary:    Comments: Exam deferred; denies complaints Musculoskeletal:        General: No swelling, tenderness, deformity or signs of injury. Normal range of motion.     Cervical back: Normal range of motion and neck supple. No rigidity or tenderness.     Right lower leg: No edema.     Left lower leg: No edema.  Lymphadenopathy:     Cervical: No cervical adenopathy.     Right cervical: No superficial, deep or posterior cervical adenopathy.    Left cervical: No superficial, deep or posterior cervical adenopathy.  Skin:    General: Skin is warm and dry.     Capillary Refill: Capillary refill takes less than 2 seconds.     Coloration: Skin is not jaundiced or pale.     Findings: No bruising, erythema, lesion or rash.  Neurological:     General: No focal deficit present.     Mental Status: He is alert and oriented to person, place, and time. Mental status is at baseline.     GCS: GCS eye subscore is 4. GCS verbal subscore is 5. GCS motor subscore is 6.     Sensory: Sensation is intact. No sensory deficit.     Motor: Motor function is intact. No weakness.     Coordination: Coordination is intact.     Gait: Gait is intact.  Psychiatric:        Attention and Perception: Attention and perception normal.        Mood and Affect: Mood and affect normal.        Speech: Speech normal.        Behavior: Behavior normal. Behavior is cooperative.        Thought Content: Thought content normal.        Cognition and Memory: Cognition normal.        Judgment: Judgment normal.      Most recent functional status assessment:    12/21/2021    1:35 PM  In your present state of health, do you have any difficulty performing the following activities:  Hearing? 0  Vision? 1  Difficulty concentrating or making decisions? 0  Walking or climbing stairs? 0  Dressing or bathing? 0  Doing errands, shopping? 0   Most recent fall risk assessment:    12/21/2021    1:35 PM  Fall Risk   Falls in the past year? 0  Number falls in past yr: 0  Injury with Fall? 0  Risk for fall due to : No Fall Risks  Follow up Falls evaluation completed    Most recent depression screenings:    12/21/2021    1:35 PM 03/01/2021   10:34 AM  PHQ 2/9 Scores  PHQ - 2 Score 0 0  PHQ- 9 Score 1 2   Most recent cognitive screening:    02/02/2019    1:26 PM  6CIT Screen  What Year? 0 points  What month? 0 points  What time? 0 points  Count back from 20 0 points  Months in reverse 0 points  Repeat phrase 4 points  Total Score 4 points   Most recent Audit-C alcohol use screening    12/21/2021    1:35 PM  Alcohol Use Disorder Test (AUDIT)  1. How often do you have a drink  containing alcohol? 1  2. How many drinks containing alcohol do you have on a typical day when you are drinking? 0  3. How often do you have six or more drinks on one occasion? 0  AUDIT-C Score 1   A score of 3 or more in women, and 4 or more in men indicates increased risk for alcohol abuse, EXCEPT if all of the points are from question 1   No results found for any visits on 03/02/22.  Assessment & Plan     Annual wellness visit done today including the all of the following: Reviewed patient's Family Medical History Reviewed and updated list of patient's medical providers Assessment of cognitive impairment was done Assessed patient's functional ability Established a written schedule for health screening services Health Risk Assessent Completed and Reviewed  Exercise Activities and Dietary  recommendations  Goals      DIET - INCREASE WATER INTAKE     Recommend increasing water intake to 4-6 glasses a day.        Immunization History  Administered Date(s) Administered   Fluad Quad(high Dose 65+) 02/03/2019, 02/10/2020   Influenza, High Dose Seasonal PF 01/14/2015, 11/25/2015, 11/30/2016, 12/06/2017   Moderna Sars-Covid-2 Vaccination 03/28/2019, 04/25/2019   Pneumococcal Conjugate-13 01/25/2017   Pneumococcal Polysaccharide-23 01/28/2018   Tdap 12/17/2013    Health Maintenance  Topic Date Due   HEMOGLOBIN A1C  04/24/2014   COVID-19 Vaccine (3 - Moderna risk series) 03/18/2022 (Originally 05/23/2019)   Zoster Vaccines- Shingrix (1 of 2) 03/23/2022 (Originally 05/16/1964)   INFLUENZA VACCINE  05/13/2022 (Originally 09/12/2021)   Medicare Annual Wellness (AWV)  03/03/2023   DTaP/Tdap/Td (2 - Td or Tdap) 12/18/2023   Pneumonia Vaccine 40+ Years old  Completed   Hepatitis C Screening  Completed   HPV VACCINES  Aged Out   Fecal DNA (Cologuard)  Discontinued     Discussed health benefits of physical activity, and encouraged him to engage in regular exercise appropriate for his age and condition.    Problem List Items Addressed This Visit       Cardiovascular and Mediastinum   Abdominal aortic aneurysm (AAA) without rupture (HCC)    Chronic, stable Followed by vascular and cardiac specialist On statin- lipitor at 40 mg; LP reviewed; at goal Continues to use tobacco, chewing; declines cessation HTN well controlled- continue on toprol 25      Essential hypertension    Chronic, stable, at goal <130/<80 Continue Toprol 25  F/b cardiology       PAD (peripheral artery disease) (HCC)    Chronic, stable Denies current concerns Continues to follow with vascular; remains on statin, pre-diabetic, good BP control       PVD (peripheral vascular disease) (HCC)    Chronic, stable Denies current concerns Continues to follow with vascular; remains on statin,  pre-diabetic, good BP control         Other   Chewing tobacco nicotine dependence without complication    Chronic, stable Denies assistance with tobacco resources for cessation       Elevated PSA    Initial elevation noted 11/23; repeat slightly improved 12/23 Pt has seen urology; I have Rx'd steroid and anti-histamine to assist with previous skin rash following use of IV dye Secure chat sent to urology and consult team for scheduling as pt has not heard from teams      Encounter for routine adult physical exam with abnormal findings    No acute concerns; well controlled chronic concerns Things to do  to keep yourself healthy  - Exercise at least 30-45 minutes a day, 3-4 days a week.  - Eat a low-fat diet with lots of fruits and vegetables, up to 7-9 servings per day.  - Seatbelts can save your life. Wear them always.  - Smoke detectors on every level of your home, check batteries every year.  - Eye Doctor - have an eye exam every 1-2 years  - Safe sex - if you may be exposed to STDs, use a condom.  - Alcohol -  If you drink, do it moderately, less than 2 drinks per day.  - [place]. Choose someone to speak for you if you are not able.  - Depression is common in our stressful world.If you're feeling down or losing interest in things you normally enjoy, please come in for a visit.  - Violence - If anyone is threatening or hurting you, please call immediately.       Hyperlipidemia    Chronic, stable Continue lipitor 40 recommend diet low in saturated fat and regular exercise - 30 min at least 5 times per week       Medicare annual wellness visit, subsequent - Primary    Due for vision UTD on dentist Reports glasses are well functioning Denies falls or injuries Continues to follow up with multiple specialists No concern for neuro/cognitive deficits Denies CCM      Prediabetes    Chronic, stable Continue to recommend balanced, lower carb meals.  Smaller meal size, adding snacks. Choosing water as drink of choice and increasing purposeful exercise.       Return in about 6 months (around 08/31/2022) for chonic disease management.    Vonna Kotyk, FNP, have reviewed all documentation for this visit. The documentation on 03/02/22 for the exam, diagnosis, procedures, and orders are all accurate and complete.  Gwyneth Sprout, [place] [phone] (phone) [phone] (fax)  [place]

## 2022-03-02 NOTE — Assessment & Plan Note (Signed)
Chronic, stable Denies assistance with tobacco resources for cessation

## 2022-03-02 NOTE — Assessment & Plan Note (Signed)
Chronic, stable Continue lipitor 40 recommend diet low in saturated fat and regular exercise - 30 min at least 5 times per week

## 2022-03-02 NOTE — Assessment & Plan Note (Signed)
No acute concerns; well controlled chronic concerns Things to do to keep yourself healthy  - Exercise at least 30-45 minutes a day, 3-4 days a week.  - Eat a low-fat diet with lots of fruits and vegetables, up to 7-9 servings per day.  - Seatbelts can save your life. Wear them always.  - Smoke detectors on every level of your home, check batteries every year.  - Eye Doctor - have an eye exam every 1-2 years  - Safe sex - if you may be exposed to STDs, use a condom.  - Alcohol -  If you drink, do it moderately, less than 2 drinks per day.  - [place]. Choose someone to speak for you if you are not able.  - Depression is common in our stressful world.If you're feeling down or losing interest in things you normally enjoy, please come in for a visit.  - Violence - If anyone is threatening or hurting you, please call immediately.

## 2022-03-02 NOTE — Assessment & Plan Note (Signed)
Initial elevation noted 11/23; repeat slightly improved 12/23 Pt has seen urology; I have Rx'd steroid and anti-histamine to assist with previous skin rash following use of IV dye Secure chat sent to urology and consult team for scheduling as pt has not heard from teams

## 2022-03-02 NOTE — Assessment & Plan Note (Signed)
Chronic, stable Continue to recommend balanced, lower carb meals. Smaller meal size, adding snacks. Choosing water as drink of choice and increasing purposeful exercise.  

## 2022-03-04 NOTE — Telephone Encounter (Signed)
He is scheduled for the MRI 03/09/2022.  He was not aware that he was scheduled.  I do not think he has any information on the pre-MRI instructions.  Please contact him to make sure he does not have any questions and may need to send him the info on MyChart.  Thanks

## 2022-03-05 NOTE — Telephone Encounter (Signed)
Went over instruction over phone can't get on my chart   Prostate MRI Prep:  1- No ejaculation 48 hours prior to exam  2- No caffeine or carbonated beverages on day of the exam  3- Eat light diet evening prior and day of exam  4- Avoid eating 4 hours prior to exam  5- Fleets enema needs to be done 4 hours prior to exam -See below. Can be purchased at the drug store.

## 2022-03-06 ENCOUNTER — Telehealth: Payer: Self-pay | Admitting: Family Medicine

## 2022-03-06 NOTE — Telephone Encounter (Signed)
LVM

## 2022-03-07 ENCOUNTER — Ambulatory Visit: Payer: Medicare Other | Admitting: Family Medicine

## 2022-03-07 DIAGNOSIS — I739 Peripheral vascular disease, unspecified: Secondary | ICD-10-CM

## 2022-03-07 LAB — POCT INR
INR: 2.4 (ref 2.0–3.0)
PT: 29.3

## 2022-03-07 NOTE — Patient Instructions (Signed)
Description   10 tonight and tomorrow then continue 7.5 mg daily  Recheck in 2 week.

## 2022-03-09 ENCOUNTER — Ambulatory Visit
Admission: RE | Admit: 2022-03-09 | Discharge: 2022-03-09 | Disposition: A | Discharge status: Home / Self Care | Payer: Medicare Other | Source: Ambulatory Visit | Attending: Urology | Admitting: Urology

## 2022-03-09 DIAGNOSIS — R972 Elevated prostate specific antigen [PSA]: Secondary | ICD-10-CM | POA: Insufficient documentation

## 2022-03-09 MED ORDER — GADOBUTROL 1 MMOL/ML IV SOLN
9.0000 mL | Freq: Once | INTRAVENOUS | Status: AC | PRN
  Administered 2022-03-09: 9 mL via INTRAVENOUS

## 2022-03-21 ENCOUNTER — Ambulatory Visit: Payer: Medicare Other

## 2022-03-21 DIAGNOSIS — I739 Peripheral vascular disease, unspecified: Secondary | ICD-10-CM

## 2022-03-21 LAB — POCT INR
INR: 3.9 — AB (ref 2.0–3.0)
POC INR: 3.9
PT: 46.9

## 2022-03-21 NOTE — Patient Instructions (Signed)
Description   7.5 mg daily  Recheck in 2 week.

## 2022-03-25 ENCOUNTER — Encounter: Payer: Self-pay | Admitting: Urology

## 2022-03-28 ENCOUNTER — Ambulatory Visit: Payer: Medicare Other | Attending: Physician Assistant | Admitting: Physician Assistant

## 2022-03-28 ENCOUNTER — Encounter: Payer: Self-pay | Admitting: Physician Assistant

## 2022-03-28 VITALS — BP 122/70 | HR 75 | Ht 74.0 in | Wt 198.6 lb

## 2022-03-28 DIAGNOSIS — I2510 Atherosclerotic heart disease of native coronary artery without angina pectoris: Secondary | ICD-10-CM | POA: Diagnosis not present

## 2022-03-28 DIAGNOSIS — I739 Peripheral vascular disease, unspecified: Secondary | ICD-10-CM

## 2022-03-28 DIAGNOSIS — I714 Abdominal aortic aneurysm, without rupture, unspecified: Secondary | ICD-10-CM | POA: Diagnosis not present

## 2022-03-28 DIAGNOSIS — E782 Mixed hyperlipidemia: Secondary | ICD-10-CM

## 2022-03-28 DIAGNOSIS — I10 Essential (primary) hypertension: Secondary | ICD-10-CM

## 2022-03-28 NOTE — Assessment & Plan Note (Signed)
The patient's blood pressure is controlled on his current regimen.  Continue current therapy.  

## 2022-03-28 NOTE — Progress Notes (Signed)
Cardiology Office Note:    Date:  03/28/2022   ID:  [name] [name], DOB May 01, 1945, MRN [id number]  PCP:  Gwyneth Sprout, [place] Providers Cardiologist:  Sherren Mocha, MD    Referring MD: Gwyneth Sprout, FNP   Patient Profile: Coronary artery disease  S/p inferior STEMI in 2007 >>PCI: BMS to RCA ([facility]) No significant dz in LCx, LAD TTE 10/26/13: Moderate LVH, EF 55-60, no RWMA, GR 1 DD  Abdominal aortic aneurysm, L pop aneurysm - s/p EVAR in 2011 Peripheral arterial disease (followed by Dr. Trula Slade w [facility]) On chronic anticoagulation w warfarin   Hx of popliteal artery stent in 2011 S/p amputation of L 4&5 toes S/p L fem-pop bypass in 2015 S/p angioplasty of graft in 9/19 Hypertension  Hyperlipidemia  Hx of retinal detachment      History of Present Illness:   [name] [name] is a 77 y.o. male with the above problem list.  He was last seen by Dr. Burt Knack 03/17/21. He returns for f/u on CAD. He is here alone. He denies chest pain, shortness of breath, syncope, orthopnea, PND or significant pedal edema. He walks about a mile a day with his wife. He had an enlarged prostate noted recently. An MRI was obtained which did not show evidence of CA.   EKG: NSR, HR 75, inf Q waves, TW inversions in III, aVF, QTc 393, no change from prior EKG     Subjective    Reviewed and updated this encounter:   Tobacco  Allergies  Meds  Problems  Med Hx  Surg Hx  Fam Hx     Review of Systems  Gastrointestinal:  Negative for hematochezia.  Genitourinary:  Negative for hematuria.    Objective   Labs/Other Test Reviewed:   Recent Labs: 12/25/2021: ALT 15; BUN 16; Creatinine, Ser 1.29; Hemoglobin 14.5; Platelets 322; Potassium 4.4; Sodium 142; TSH 4.430   Recent Lipid Panel Recent Labs    12/25/21 0954  CHOL 122  TRIG 145  HDL 29*  LDLCALC 67     Risk Assessment/Calculations/Metrics:             Physical Exam:   VS:  BP 122/70   Pulse 75   Ht 6' 2"$  (1.88  m)   Wt 198 lb 9.6 oz (90.1 kg)   SpO2 99%   BMI 25.50 kg/m    Wt Readings from Last 3 Encounters:  03/28/22 198 lb 9.6 oz (90.1 kg)  03/02/22 196 lb 9.6 oz (89.2 kg)  01/11/22 195 lb (88.5 kg)    Constitutional:      Appearance: Healthy appearance. Not in distress.  Neck:     Vascular: No carotid bruit. JVD normal.  Pulmonary:     Breath sounds: Normal breath sounds. No wheezing. No rales.  Cardiovascular:     Normal rate. Regular rhythm. Normal S1. Normal S2.      Murmurs: There is no murmur.  Edema:    Peripheral edema absent.  Abdominal:     Palpations: Abdomen is soft.      Assessment & Plan    ASSESSMENT & PLAN:   Coronary artery disease involving native coronary artery of native heart without angina pectoris Inferior STEMI s/p BMS at [facility] in 2007. Echocardiogram in 2015 with normal EF. He is doing well w/o angina. Continue ASA 81 mg, Lipitor 40 mg, Toprol XL 25 mg. F/u 1 year.   Hyperlipidemia LDL optimal in 12/2021. ALT normal. Continue Lipitor 40 mg  once daily, Fenofibrate 54 mg once daily.  PAD (peripheral artery disease) He is followed annually by vascular surgery (Dr. Trula Slade). He is maintained on Warfarin which is followed by his PCP.   AAA (abdominal aortic aneurysm) S/p EVAR in 2011. He is followed by primary care.   Essential hypertension The patient's blood pressure is controlled on his current regimen.  Continue current therapy.               Dispo:  Return in about 1 year (around 03/29/2023) for Routine Follow Up, w/ Dr. Burt Knack, or Richardson Dopp, PA-C.   Signed, Richardson Dopp, PA-C  03/28/2022 [address], [place], [place]  [address] Phone: [phone]; Fax: [phone]

## 2022-03-28 NOTE — Patient Instructions (Signed)
Medication Instructions:  Your physician recommends that you continue on your current medications as directed. Please refer to the Current Medication list given to you today.  *If you need a refill on your cardiac medications before your next appointment, please call your pharmacy*   Lab Work: None ordered  If you have labs (blood work) drawn today and your tests are completely normal, you will receive your results only by: [place] (if you have MyChart) OR A paper copy in the mail If you have any lab test that is abnormal or we need to change your treatment, we will call you to review the results.   Testing/Procedures: None ordered   Follow-Up: At [facility], you and your health needs are our priority.  As part of our continuing mission to provide you with exceptional heart care, we have created designated Provider Care Teams.  These Care Teams include your primary Cardiologist (physician) and Advanced Practice Providers (APPs -  Physician Assistants and Nurse Practitioners) who all work together to provide you with the care you need, when you need it.  We recommend signing up for the patient portal called "MyChart".  Sign up information is provided on this After Visit Summary.  MyChart is used to connect with patients for Virtual Visits (Telemedicine).  Patients are able to view lab/test results, encounter notes, upcoming appointments, etc.  Non-urgent messages can be sent to your provider as well.   To learn more about what you can do with MyChart, go to [email or web address].    Your next appointment:   1 year(s)  Provider:   Sherren Mocha, MD     Other Instructions

## 2022-03-28 NOTE — Assessment & Plan Note (Signed)
Inferior STEMI s/p BMS at [facility] in 2007. Echocardiogram in 2015 with normal EF. He is doing well w/o angina. Continue ASA 81 mg, Lipitor 40 mg, Toprol XL 25 mg. F/u 1 year.

## 2022-03-28 NOTE — Assessment & Plan Note (Signed)
LDL optimal in 12/2021. ALT normal. Continue Lipitor 40 mg once daily, Fenofibrate 54 mg once daily.

## 2022-03-28 NOTE — Assessment & Plan Note (Signed)
S/p EVAR in 2011. He is followed by primary care.

## 2022-03-28 NOTE — Assessment & Plan Note (Signed)
He is followed annually by vascular surgery (Dr. Trula Slade). He is maintained on Warfarin which is followed by his PCP.

## 2022-03-30 ENCOUNTER — Other Ambulatory Visit: Payer: Self-pay | Admitting: Cardiovascular Disease

## 2022-04-04 ENCOUNTER — Ambulatory Visit: Payer: Medicare Other

## 2022-04-04 DIAGNOSIS — I739 Peripheral vascular disease, unspecified: Secondary | ICD-10-CM | POA: Diagnosis not present

## 2022-04-04 LAB — POCT INR
INR: 3.2 — AB (ref 2.0–3.0)
POC INR: 3.2
PT: 38.8

## 2022-04-04 NOTE — Patient Instructions (Signed)
Description   7.5 mg daily  Recheck in 4 week.

## 2022-04-11 ENCOUNTER — Other Ambulatory Visit: Payer: Self-pay | Admitting: Family Medicine

## 2022-04-11 DIAGNOSIS — I739 Peripheral vascular disease, unspecified: Secondary | ICD-10-CM

## 2022-04-16 ENCOUNTER — Other Ambulatory Visit: Payer: Self-pay | Admitting: Cardiovascular Disease

## 2022-04-30 ENCOUNTER — Ambulatory Visit (HOSPITAL_COMMUNITY)
Admission: RE | Admit: 2022-04-30 | Discharge: 2022-04-30 | Disposition: A | Discharge status: Home / Self Care | Payer: Medicare Other | Source: Ambulatory Visit | Attending: Surgery | Admitting: Surgery

## 2022-04-30 ENCOUNTER — Other Ambulatory Visit: Payer: Self-pay | Admitting: Surgery

## 2022-04-30 ENCOUNTER — Ambulatory Visit: Payer: Medicare Other | Admitting: Surgery

## 2022-04-30 ENCOUNTER — Ambulatory Visit (INDEPENDENT_AMBULATORY_CARE_PROVIDER_SITE_OTHER)
Admission: RE | Admit: 2022-04-30 | Discharge: 2022-04-30 | Disposition: A | Discharge status: Home / Self Care | Payer: Medicare Other | Source: Ambulatory Visit | Attending: Surgery | Admitting: Surgery

## 2022-04-30 ENCOUNTER — Encounter: Payer: Self-pay | Admitting: Surgery

## 2022-04-30 VITALS — BP 122/65 | HR 60 | Temp 98.2°F | Resp 20 | Ht 74.0 in | Wt 197.0 lb

## 2022-04-30 DIAGNOSIS — I714 Abdominal aortic aneurysm, without rupture, unspecified: Secondary | ICD-10-CM | POA: Diagnosis present

## 2022-04-30 DIAGNOSIS — I739 Peripheral vascular disease, unspecified: Secondary | ICD-10-CM

## 2022-04-30 DIAGNOSIS — I724 Aneurysm of artery of lower extremity: Secondary | ICD-10-CM

## 2022-04-30 DIAGNOSIS — I7143 Infrarenal abdominal aortic aneurysm, without rupture: Secondary | ICD-10-CM

## 2022-04-30 LAB — VAS US ABI WITH/WO TBI
Left ABI: 0.58
Right ABI: 1.12

## 2022-04-30 NOTE — H&P (View-Only) (Signed)
 [facility] [place]  Patient name: [name] [name] MRN: [id number] DOB: 05/15/1945 Sex: male   REASON FOR VISIT:    Follow up  [name]:    [name] [name] is a 77 y.o. male who is back today for follow-up.  He has undergone the following procedures:               -October 2011: Endovascular aneurysm repair, with known type II endoleak             -October 2011: Endovascular repair of left popliteal aneurysm             -October 29, 2013: Femoral posterior tibial bypass graft: This was performed after failed attempted lytic therapy for occluded popliteal stents.  The bypass graft occluded early requiring reexploration.  The posterior tibial artery was opened behind the ankle and Fogarty catheters were able to be passed around the pedal arch.             -05/25/2014: Drug-coated balloon angioplasty to the distal anastomosis of his left leg bypass             -10/22/2017: Drug-coated balloon angioplasty, left distal anastomosis of his left leg bypass     The patient continues to take a statin for hypercholesterolemia.  He is on Coumadin. At his last visit, there was a distal anastomotic stenosis in the 50% range with a maximum velocity of 217 cm/s.  This was associated with a slight drop in ABIs.  He is also being followed for a right popliteal aneurysm with maximum diameter of 1.7 cm as well as a abdominal aortic aneurysm, status post EVAR.  He is back for follow-up.   PAST MEDICAL HISTORY:   Past Medical History:  Diagnosis Date   Abdominal aortic aneurysm (HCC)    Chronic kidney disease    Right kidney stone   Coronary artery disease    Detached retina    Hyperlipidemia    Hypertension    Myocardial infarction (HCC)    2007 heart stent   Popliteal aneurysm (HCC)      FAMILY HISTORY:   Family History  Problem Relation Age of Onset   Aneurysm Sister        possibly    Peripheral vascular  disease Brother     SOCIAL HISTORY:   Social History   Tobacco Use   Smoking status: Former    Years: 15    Types: Cigarettes    Quit date: 09/21/1985    Years since quitting: 36.6   Smokeless tobacco: Current    Types: Chew   Tobacco comments:    once a day; chews tobacco  Substance Use Topics   Alcohol use: Yes    Alcohol/week: 0.0 - 1.0 standard drinks of alcohol    Comment: 1 beer ocassionally (mostly in summer)     ALLERGIES:   Allergies  Allergen Reactions   Omnipaque [Iohexol] Rash and Other (See Comments)    Pt requires full premeds and needs to bring a driver.  Broke out in rash on back and arms.  Ok w/ premeds   Oxytetracycline Rash and Other (See Comments)    Terramycin causes rash   Alteplase Other (See Comments)    Internal bleeding, per RN in short stay. Patient requested this to be added to his allergy list     CURRENT MEDICATIONS:   Current Outpatient Medications  Medication Sig Dispense Refill   Aspirin Buf,CaCarb-MgCarb-MgO, 81 MG   TABS Take 1 tablet by mouth daily.     atorvastatin (LIPITOR) 40 MG tablet TAKE 1 TABLET BY MOUTH EVERY DAY 90 tablet 3   Coenzyme Q10 (CO Q-10) 100 MG CAPS Take 100 capsules by mouth daily.  0   diphenhydrAMINE (BENADRYL ALLERGY) 25 mg capsule Take 1 capsule (25 mg total) by mouth once for 1 dose. Take with 50 mg prednisone (3rd dose) 1 hour prior to imaging. 1 capsule 0   fenofibrate 54 MG tablet TAKE 1 TABLET BY MOUTH DAILY. PLEASE SCHEDULE AN APPT.WITH DR. COOPER FOR MORE REFILLS 90 tablet 1   fluticasone (FLONASE) 50 MCG/ACT nasal spray Place 2 sprays into both nostrils daily. 16 g 6   metoprolol succinate (TOPROL-XL) 25 MG 24 hr tablet TAKE 1 TABLET (25 MG TOTAL) BY MOUTH DAILY. 90 tablet 3   Multiple Vitamin (MULTIVITAMIN WITH MINERALS) TABS tablet Take 1 tablet by mouth daily.     Omega-3 Fatty Acids (FISH OIL TRIPLE STRENGTH) 1400 MG CAPS Take 2,800 mg by mouth 2 (two) times daily.     Saw Palmetto 450 MG CAPS Take  900 mg by mouth 2 (two) times daily.     warfarin (COUMADIN) 5 MG tablet TAKE 1 AND 1/2 TABLETS BY MOUTH EVERY DAY 135 tablet 2   predniSONE (DELTASONE) 50 MG tablet Take 1st tablet PO 13 hours prior to imaging, take 2nd tablet PO 7 hours prior to imaging, and take 3rd tablet PO 1 hour prior to imaging. (Patient not taking: Reported on 03/28/2022) 3 tablet 0   No current facility-administered medications for this visit.    REVIEW OF SYSTEMS:   [X] denotes positive finding, [ ] denotes negative finding Cardiac  Comments:  Chest pain or chest pressure:    Shortness of breath upon exertion:    Short of breath when lying flat:    Irregular heart rhythm:        Vascular    Pain in calf, thigh, or hip brought on by ambulation:    Pain in feet at night that wakes you up from your sleep:     Blood clot in your veins:    Leg swelling:         Pulmonary    Oxygen at home:    Productive cough:     Wheezing:         Neurologic    Sudden weakness in arms or legs:     Sudden numbness in arms or legs:     Sudden onset of difficulty speaking or slurred speech:    Temporary loss of vision in one eye:     Problems with dizziness:         Gastrointestinal    Blood in stool:     Vomited blood:         Genitourinary    Burning when urinating:     Blood in urine:        Psychiatric    Major depression:         Hematologic    Bleeding problems:    Problems with blood clotting too easily:        Skin    Rashes or ulcers:        Constitutional    Fever or chills:      PHYSICAL EXAM:   Vitals:   04/30/22 0903  BP: 122/65  Pulse: 60  Resp: 20  Temp: 98.2 F (36.8 C)  SpO2: 96%  Weight: 197 lb (89.4 kg)    Height: 6' 2" (1.88 m)    GENERAL: The patient is a well-nourished male, in no acute distress. The vital signs are documented above. CARDIAC: There is a regular rate and rhythm.  VASCULAR: Nonpalpable pedal pulses PULMONARY: Non-labored respirations ABDOMEN: Soft and  non-tender with normal pitched bowel sounds.  MUSCULOSKELETAL: There are no major deformities or cyanosis. NEUROLOGIC: No focal weakness or paresthesias are detected. SKIN: There are no ulcers or rashes noted. PSYCHIATRIC: The patient has a normal affect.  STUDIES:   I have reviewed the following: ABI/TBIToday's ABI          Today's TBIPrevious ABIPrevious TBI  +-------+---------------------+-----------+------------+------------+  Right 1.12 likely calcified0.76       0.84        0.42          +-------+---------------------+-----------+------------+------------+  Left  0.58                 0.46       0.65        0.25          +-------+---------------------+-----------+------------+------------+  Right: Continued aneurysmal dilatation of the mid/distal SFA measuring  approximately 1.54 cm x 1.64 cm. Measuring slightly smaller than the  previous measurements on 03/13/2021 of 1.74 x 1.73 cm.   Left: Patent femoral to posterior tibial artery graft with velocities in  the proximal segment <40 cm/s which could suggest a threatened graft.  Distal anastomosis velocities suggest a 50-70% stenosis by ratio.  Difficult determining distal anastomosis vs native outflow artery.    AAA; Abdominal Aorta: Patent endovascular aneurysm repair with no obvious  evidence of endoleak.  The largest aortic diameter has increased compared to prior exam. Previous  diameter measurement was 3.5 cm obtained on 03/13/2021.   MEDICAL ISSUES:   AAA: Need repeat ultrasound in 6 months to evaluate slight increase in size of his aneurysm  Left popliteal artery aneurysm: Velocities are decreased at his bypass graft with known stenosis.  I feel like this needs to be addressed to prevent bypass graft failure.  This will need to be through an antegrade approach as he has a endovascular stent graft for his abdominal aneurysm.  He will need to be off of his Coumadin.  I will get this scheduled within the  next several weeks  Right popliteal aneurysm: Maximum diameter is 1.7 cm.  I will repeat his ultrasound in 6 months.    Wells [name] [name], IV, MD, FACS [facility] [place] Tel [phone] Pager [phone]  

## 2022-04-30 NOTE — Progress Notes (Signed)
[facility] [place]  Patient name: [name] [name] MRN: [id number] DOB: 05-31-1945 Sex: male   REASON FOR VISIT:    Follow up  [name]:    homas E Sarni is a 77 y.o. male who is back today for follow-up.  He has undergone the following procedures:               -October 2011: Endovascular aneurysm repair, with known type II endoleak             -October 2011: Endovascular repair of left popliteal aneurysm             -October 29, 2013: Femoral posterior tibial bypass graft: This was performed after failed attempted lytic therapy for occluded popliteal stents.  The bypass graft occluded early requiring reexploration.  The posterior tibial artery was opened behind the ankle and Fogarty catheters were able to be passed around the pedal arch.             -05/25/2014: Drug-coated balloon angioplasty to the distal anastomosis of his left leg bypass             -10/22/2017: Drug-coated balloon angioplasty, left distal anastomosis of his left leg bypass     The patient continues to take a statin for hypercholesterolemia.  He is on Coumadin. At his last visit, there was a distal anastomotic stenosis in the 50% range with a maximum velocity of 217 cm/s.  This was associated with a slight drop in ABIs.  He is also being followed for a right popliteal aneurysm with maximum diameter of 1.7 cm as well as a abdominal aortic aneurysm, status post EVAR.  He is back for follow-up.   PAST MEDICAL HISTORY:   Past Medical History:  Diagnosis Date   Abdominal aortic aneurysm ([place])    Chronic kidney disease    Right kidney stone   Coronary artery disease    Detached retina    Hyperlipidemia    Hypertension    Myocardial infarction [facility])    2007 heart stent   Popliteal aneurysm ([place])      FAMILY HISTORY:   Family History  Problem Relation Age of Onset   Aneurysm Sister        possibly    Peripheral vascular  disease Brother     SOCIAL HISTORY:   Social History   Tobacco Use   Smoking status: Former    Years: 15    Types: Cigarettes    Quit date: 09/21/1985    Years since quitting: 36.6   Smokeless tobacco: Current    Types: Chew   Tobacco comments:    once a day; chews tobacco  Substance Use Topics   Alcohol use: Yes    Alcohol/week: 0.0 - 1.0 standard drinks of alcohol    Comment: 1 beer ocassionally (mostly in summer)     ALLERGIES:   Allergies  Allergen Reactions   Omnipaque [Iohexol] Rash and Other (See Comments)    Pt requires full premeds and needs to bring a driver.  Broke out in rash on back and arms.  Ok w/ premeds   Oxytetracycline Rash and Other (See Comments)    Terramycin causes rash   Alteplase Other (See Comments)    Internal bleeding, per RN in short stay. Patient requested this to be added to his allergy list     CURRENT MEDICATIONS:   Current Outpatient Medications  Medication Sig Dispense Refill   Aspirin Buf,CaCarb-MgCarb-MgO, 81 MG  TABS Take 1 tablet by mouth daily.     atorvastatin (LIPITOR) 40 MG tablet TAKE 1 TABLET BY MOUTH EVERY DAY 90 tablet 3   Coenzyme Q10 (CO Q-10) 100 MG CAPS Take 100 capsules by mouth daily.  0   diphenhydrAMINE (BENADRYL ALLERGY) 25 mg capsule Take 1 capsule (25 mg total) by mouth once for 1 dose. Take with 50 mg prednisone (3rd dose) 1 hour prior to imaging. 1 capsule 0   fenofibrate 54 MG tablet TAKE 1 TABLET BY MOUTH DAILY. PLEASE SCHEDULE AN APPT.WITH DR. Burt Knack FOR MORE REFILLS 90 tablet 1   fluticasone (FLONASE) 50 MCG/ACT nasal spray Place 2 sprays into both nostrils daily. 16 g 6   metoprolol succinate (TOPROL-XL) 25 MG 24 hr tablet TAKE 1 TABLET (25 MG TOTAL) BY MOUTH DAILY. 90 tablet 3   Multiple Vitamin (MULTIVITAMIN WITH MINERALS) TABS tablet Take 1 tablet by mouth daily.     Omega-3 Fatty Acids (FISH OIL TRIPLE STRENGTH) 1400 MG CAPS Take 2,800 mg by mouth 2 (two) times daily.     Saw Palmetto 450 MG CAPS Take  900 mg by mouth 2 (two) times daily.     warfarin (COUMADIN) 5 MG tablet TAKE 1 AND 1/2 TABLETS BY MOUTH EVERY DAY 135 tablet 2   predniSONE (DELTASONE) 50 MG tablet Take 1st tablet PO 13 hours prior to imaging, take 2nd tablet PO 7 hours prior to imaging, and take 3rd tablet PO 1 hour prior to imaging. (Patient not taking: Reported on 03/28/2022) 3 tablet 0   No current facility-administered medications for this visit.    REVIEW OF SYSTEMS:   [X]  denotes positive finding, [ ]  denotes negative finding Cardiac  Comments:  Chest pain or chest pressure:    Shortness of breath upon exertion:    Short of breath when lying flat:    Irregular heart rhythm:        Vascular    Pain in calf, thigh, or hip brought on by ambulation:    Pain in feet at night that wakes you up from your sleep:     Blood clot in your veins:    Leg swelling:         Pulmonary    Oxygen at home:    Productive cough:     Wheezing:         Neurologic    Sudden weakness in arms or legs:     Sudden numbness in arms or legs:     Sudden onset of difficulty speaking or slurred speech:    Temporary loss of vision in one eye:     Problems with dizziness:         Gastrointestinal    Blood in stool:     Vomited blood:         Genitourinary    Burning when urinating:     Blood in urine:        Psychiatric    Major depression:         Hematologic    Bleeding problems:    Problems with blood clotting too easily:        Skin    Rashes or ulcers:        Constitutional    Fever or chills:      PHYSICAL EXAM:   Vitals:   04/30/22 0903  BP: 122/65  Pulse: 60  Resp: 20  Temp: 98.2 F (36.8 C)  SpO2: 96%  Weight: 197 lb (89.4 kg)  Height: 6\' 2"  (1.88 m)    GENERAL: The patient is a well-nourished male, in no acute distress. The vital signs are documented above. CARDIAC: There is a regular rate and rhythm.  VASCULAR: Nonpalpable pedal pulses PULMONARY: Non-labored respirations ABDOMEN: Soft and  non-tender with normal pitched bowel sounds.  MUSCULOSKELETAL: There are no major deformities or cyanosis. NEUROLOGIC: No focal weakness or paresthesias are detected. SKIN: There are no ulcers or rashes noted. PSYCHIATRIC: The patient has a normal affect.  STUDIES:   I have reviewed the following: ABI/TBIToday's ABI          Today's TBIPrevious ABIPrevious TBI  +-------+---------------------+-----------+------------+------------+  Right 1.12 likely calcified0.76       0.84        0.42          +-------+---------------------+-----------+------------+------------+  Left  0.58                 0.46       0.65        0.25          +-------+---------------------+-----------+------------+------------+  Right: Continued aneurysmal dilatation of the mid/distal SFA measuring  approximately 1.54 cm x 1.64 cm. Measuring slightly smaller than the  previous measurements on 03/13/2021 of 1.74 x 1.73 cm.   Left: Patent femoral to posterior tibial artery graft with velocities in  the proximal segment <40 cm/s which could suggest a threatened graft.  Distal anastomosis velocities suggest a 50-70% stenosis by ratio.  Difficult determining distal anastomosis vs native outflow artery.    AAA; Abdominal Aorta: Patent endovascular aneurysm repair with no obvious  evidence of endoleak.  The largest aortic diameter has increased compared to prior exam. Previous  diameter measurement was 3.5 cm obtained on 03/13/2021.   MEDICAL ISSUES:   AAA: Need repeat ultrasound in 6 months to evaluate slight increase in size of his aneurysm  Left popliteal artery aneurysm: Velocities are decreased at his bypass graft with known stenosis.  I feel like this needs to be addressed to prevent bypass graft failure.  This will need to be through an antegrade approach as he has a endovascular stent graft for his abdominal aneurysm.  He will need to be off of his Coumadin.  I will get this scheduled within the  next several weeks  Right popliteal aneurysm: Maximum diameter is 1.7 cm.  I will repeat his ultrasound in 6 months.    Leia Alf, MD, FACS [facility] [phone] Pager [phone]

## 2022-05-02 ENCOUNTER — Other Ambulatory Visit: Payer: Self-pay

## 2022-05-02 ENCOUNTER — Ambulatory Visit: Payer: Medicare Other

## 2022-05-02 DIAGNOSIS — I724 Aneurysm of artery of lower extremity: Secondary | ICD-10-CM

## 2022-05-02 DIAGNOSIS — I739 Peripheral vascular disease, unspecified: Secondary | ICD-10-CM

## 2022-05-09 ENCOUNTER — Ambulatory Visit (INDEPENDENT_AMBULATORY_CARE_PROVIDER_SITE_OTHER): Payer: Medicare Other

## 2022-05-09 DIAGNOSIS — I739 Peripheral vascular disease, unspecified: Secondary | ICD-10-CM | POA: Diagnosis not present

## 2022-05-09 LAB — POCT INR
INR: 3.5 — AB (ref 2.0–3.0)
PT: 42.2

## 2022-05-09 NOTE — Patient Instructions (Signed)
Description   Continue 7.5 mg daily.Patient has surgery coming up will follow-up in a week before surgery.

## 2022-05-16 ENCOUNTER — Ambulatory Visit: Payer: Medicare Other

## 2022-05-16 DIAGNOSIS — I739 Peripheral vascular disease, unspecified: Secondary | ICD-10-CM

## 2022-05-16 LAB — POCT INR
INR: 2.8 (ref 2.0–3.0)
POC INR: 2.8
PT: 33.8

## 2022-05-16 NOTE — Patient Instructions (Signed)
Description   Continue 7.5 mg daily. F/U pending surgery.  Appointment currently in 2 weeks

## 2022-05-22 ENCOUNTER — Encounter (HOSPITAL_COMMUNITY): Payer: Self-pay | Admitting: Surgery

## 2022-05-22 ENCOUNTER — Ambulatory Visit (HOSPITAL_COMMUNITY)
Admission: RE | Admit: 2022-05-22 | Discharge: 2022-05-22 | Disposition: A | Discharge status: Home / Self Care | Payer: Medicare Other | Attending: Surgery | Admitting: Surgery

## 2022-05-22 ENCOUNTER — Encounter (HOSPITAL_COMMUNITY)
Admission: RE | Disposition: A | Discharge status: Home / Self Care | Payer: Self-pay | Source: Home / Self Care | Attending: Surgery

## 2022-05-22 DIAGNOSIS — E7800 Pure hypercholesterolemia, unspecified: Secondary | ICD-10-CM | POA: Insufficient documentation

## 2022-05-22 DIAGNOSIS — T82858A Stenosis of vascular prosthetic devices, implants and grafts, initial encounter: Secondary | ICD-10-CM | POA: Diagnosis not present

## 2022-05-22 DIAGNOSIS — I714 Abdominal aortic aneurysm, without rupture, unspecified: Secondary | ICD-10-CM | POA: Diagnosis not present

## 2022-05-22 DIAGNOSIS — Z87891 Personal history of nicotine dependence: Secondary | ICD-10-CM | POA: Diagnosis not present

## 2022-05-22 DIAGNOSIS — Z7901 Long term (current) use of anticoagulants: Secondary | ICD-10-CM | POA: Diagnosis not present

## 2022-05-22 DIAGNOSIS — I739 Peripheral vascular disease, unspecified: Secondary | ICD-10-CM

## 2022-05-22 DIAGNOSIS — Y832 Surgical operation with anastomosis, bypass or graft as the cause of abnormal reaction of the patient, or of later complication, without mention of misadventure at the time of the procedure: Secondary | ICD-10-CM | POA: Diagnosis not present

## 2022-05-22 DIAGNOSIS — I70202 Unspecified atherosclerosis of native arteries of extremities, left leg: Secondary | ICD-10-CM

## 2022-05-22 DIAGNOSIS — I724 Aneurysm of artery of lower extremity: Secondary | ICD-10-CM | POA: Diagnosis not present

## 2022-05-22 DIAGNOSIS — Z79899 Other long term (current) drug therapy: Secondary | ICD-10-CM | POA: Diagnosis not present

## 2022-05-22 HISTORY — PX: ABDOMINAL AORTOGRAM W/LOWER EXTREMITY: CATH118223

## 2022-05-22 LAB — POCT I-STAT, CHEM 8
BUN: 13 mg/dL (ref 8–23)
Calcium, Ion: 1.29 mmol/L (ref 1.15–1.40)
Chloride: 110 mmol/L (ref 98–111)
Creatinine, Ser: 1.2 mg/dL (ref 0.61–1.24)
Glucose, Bld: 106 mg/dL — ABNORMAL HIGH (ref 70–99)
HCT: 41 % (ref 39.0–52.0)
Hemoglobin: 13.9 g/dL (ref 13.0–17.0)
Potassium: 4.1 mmol/L (ref 3.5–5.1)
Sodium: 144 mmol/L (ref 135–145)
TCO2: 22 mmol/L (ref 22–32)

## 2022-05-22 LAB — PROTIME-INR
INR: 1.4 — ABNORMAL HIGH (ref 0.8–1.2)
Prothrombin Time: 17.3 seconds — ABNORMAL HIGH (ref 11.4–15.2)

## 2022-05-22 SURGERY — ABDOMINAL AORTOGRAM W/LOWER EXTREMITY
Anesthesia: LOCAL

## 2022-05-22 MED ORDER — MIDAZOLAM HCL 2 MG/2ML IJ SOLN
INTRAMUSCULAR | Status: AC
  Filled 2022-05-22: qty 2

## 2022-05-22 MED ORDER — OXYCODONE HCL 5 MG PO TABS: 5.0000 mg | ORAL_TABLET | ORAL | Status: DC | PRN

## 2022-05-22 MED ORDER — METHYLPREDNISOLONE SODIUM SUCC 125 MG IJ SOLR
125.0000 mg | INTRAMUSCULAR | Status: AC
  Administered 2022-05-22: 125 mg via INTRAVENOUS
  Filled 2022-05-22: qty 2

## 2022-05-22 MED ORDER — SODIUM CHLORIDE 0.9 % IV SOLN: INTRAVENOUS | Status: DC

## 2022-05-22 MED ORDER — HEPARIN SODIUM (PORCINE) 1000 UNIT/ML IJ SOLN
INTRAMUSCULAR | Status: DC | PRN
  Administered 2022-05-22: 7000 [IU] via INTRAVENOUS

## 2022-05-22 MED ORDER — HEPARIN (PORCINE) IN NACL 1000-0.9 UT/500ML-% IV SOLN
INTRAVENOUS | Status: DC | PRN
  Administered 2022-05-22 (×2): 500 mL

## 2022-05-22 MED ORDER — HYDRALAZINE HCL 20 MG/ML IJ SOLN: 5.0000 mg | INTRAMUSCULAR | Status: DC | PRN

## 2022-05-22 MED ORDER — FENTANYL CITRATE (PF) 100 MCG/2ML IJ SOLN
INTRAMUSCULAR | Status: AC
  Filled 2022-05-22: qty 2

## 2022-05-22 MED ORDER — SODIUM CHLORIDE 0.9 % WEIGHT BASED INFUSION: 1.0000 mL/kg/h | INTRAVENOUS | Status: DC

## 2022-05-22 MED ORDER — FENTANYL CITRATE (PF) 100 MCG/2ML IJ SOLN
INTRAMUSCULAR | Status: DC | PRN
  Administered 2022-05-22: 50 ug via INTRAVENOUS

## 2022-05-22 MED ORDER — SODIUM CHLORIDE 0.9 % IV SOLN: 250.0000 mL | INTRAVENOUS | Status: DC | PRN

## 2022-05-22 MED ORDER — FAMOTIDINE IN NACL 20-0.9 MG/50ML-% IV SOLN
20.0000 mg | INTRAVENOUS | Status: AC
  Administered 2022-05-22: 20 mg via INTRAVENOUS
  Filled 2022-05-22: qty 50

## 2022-05-22 MED ORDER — SODIUM CHLORIDE 0.9% FLUSH: 3.0000 mL | INTRAVENOUS | Status: DC | PRN

## 2022-05-22 MED ORDER — ONDANSETRON HCL 4 MG/2ML IJ SOLN: 4.0000 mg | Freq: Four times a day (QID) | INTRAMUSCULAR | Status: DC | PRN

## 2022-05-22 MED ORDER — ACETAMINOPHEN 325 MG PO TABS: 650.0000 mg | ORAL_TABLET | ORAL | Status: DC | PRN

## 2022-05-22 MED ORDER — LABETALOL HCL 5 MG/ML IV SOLN: 10.0000 mg | INTRAVENOUS | Status: DC | PRN

## 2022-05-22 MED ORDER — DIPHENHYDRAMINE HCL 50 MG/ML IJ SOLN
25.0000 mg | INTRAMUSCULAR | Status: AC
  Administered 2022-05-22: 25 mg via INTRAVENOUS
  Filled 2022-05-22: qty 1

## 2022-05-22 MED ORDER — MIDAZOLAM HCL 2 MG/2ML IJ SOLN
INTRAMUSCULAR | Status: DC | PRN
  Administered 2022-05-22: 2 mg via INTRAVENOUS

## 2022-05-22 MED ORDER — LIDOCAINE HCL (PF) 1 % IJ SOLN
INTRAMUSCULAR | Status: DC | PRN
  Administered 2022-05-22: 15 mL

## 2022-05-22 MED ORDER — MORPHINE SULFATE (PF) 2 MG/ML IV SOLN: 2.0000 mg | INTRAVENOUS | Status: DC | PRN

## 2022-05-22 MED ORDER — IODIXANOL 320 MG/ML IV SOLN
INTRAVENOUS | Status: DC | PRN
  Administered 2022-05-22: 43 mL via INTRA_ARTERIAL

## 2022-05-22 MED ORDER — LIDOCAINE HCL (PF) 1 % IJ SOLN
INTRAMUSCULAR | Status: AC
  Filled 2022-05-22: qty 30

## 2022-05-22 MED ORDER — SODIUM CHLORIDE 0.9% FLUSH: 3.0000 mL | Freq: Two times a day (BID) | INTRAVENOUS | Status: DC

## 2022-05-22 SURGICAL SUPPLY — 18 items
CATH OMNI FLUSH 5F 65CM (CATHETERS) IMPLANT
COVER DOME SNAP 22 D (MISCELLANEOUS) IMPLANT
DCB RANGER 4.0X150 150 (BALLOONS) IMPLANT
DEVICE VASC CLSR CELT ART 5 (Vascular Products) IMPLANT
KIT ENCORE 26 ADVANTAGE (KITS) IMPLANT
KIT MICROPUNCTURE NIT STIFF (SHEATH) IMPLANT
KIT PV (KITS) ×1 IMPLANT
RANGER DCB 4.0X150 150 (BALLOONS) ×1
SHEATH PINNACLE 5F 10CM (SHEATH) IMPLANT
SHEATH PINNACLE 6F 10CM (SHEATH) IMPLANT
SHEATH PROBE COVER 6X72 (BAG) IMPLANT
STOPCOCK MORSE 400PSI 3WAY (MISCELLANEOUS) IMPLANT
SYR MEDRAD MARK 7 150ML (SYRINGE) ×1 IMPLANT
TRANSDUCER W/STOPCOCK (MISCELLANEOUS) ×1 IMPLANT
TRAY PV CATH (CUSTOM PROCEDURE TRAY) ×1 IMPLANT
TUBING CIL FLEX 10 FLL-RA (TUBING) IMPLANT
WIRE G V18X300CM (WIRE) IMPLANT
WIRE STARTER BENTSON 035X150 (WIRE) IMPLANT

## 2022-05-22 NOTE — Progress Notes (Signed)
Per Dr Myra Gianotti client may resume coumadin tomorrow; client and his wife notified and voiced understanding

## 2022-05-22 NOTE — Interval H&P Note (Signed)
History and Physical Interval Note:  05/22/2022 7:48 AM  [name] [name]  has presented today for surgery, with the diagnosis of left popliteal artery aneurysm.  The various methods of treatment have been discussed with the patient and family. After consideration of risks, benefits and other options for treatment, the patient has consented to  Procedure(s): ABDOMINAL AORTOGRAM W/LOWER EXTREMITY (N/A) as a surgical intervention.  The patient's history has been reviewed, patient examined, no change in status, stable for surgery.  I have reviewed the patient's chart and labs.  Questions were answered to the patient's satisfaction.     Durene Cal

## 2022-05-22 NOTE — Op Note (Signed)
    Patient name: [name] [name] MRN: [id number] DOB: [birth year] Sex: male  05/22/2022 Pre-operative Diagnosis: Left bypass graft stenosis Post-operative diagnosis:  Same Surgeon:  Durene Cal Procedure Performed:  1.  Ultrasound-guided access, left femoral artery  2.  Left lower extremity angiogram  3.  Drug-coated balloon angioplasty, left femoral-popliteal bypass graft  4.  Closure device, Celt  5.  Conscious sedation, 45 minutes   Indications: This is a 77 year old gentleman with history of femoral-popliteal bypass grafting for thrombosed popliteal aneurysm.  Ultrasound has revealed progressive stenosis.  He is here for angiogram.  Procedure:  The patient was identified in the holding area and taken to room 8.  The patient was then placed supine on the table and prepped and draped in the usual sterile fashion.  A time out was called.  Conscious sedation was administered with the use of IV fentanyl and Versed under continuous physician and nurse monitoring.  Heart rate, blood pressure, and oxygen saturation were continuously monitored.  Total sedation time was 45 minutes.  Ultrasound was used to evaluate the left common femoral artery and the femoral-popliteal bypass graft, both of which are widely patent.  Will present lidocaine was used for local anesthesia.  The bypass graft at its anastomosis was accessed under ultrasound guidance with a micropuncture needle.  A 018 wire was then inserted followed by placement of micropuncture sheath.  Next, a Bentson wire was inserted followed by 5 [place] sheath.  Contrast injections were performed through the sheath.  Findings:     Left Lower Extremity: The left femoral popliteal bypass graft is patent.  There is slight luminal irregularity at the level of the knee consistent to the previous location of his stenosis.  The distal anastomosis is widely patent.  There are no named tibial vessels that remain patent.  Intervention: A 018 wire was then  inserted through the bypass graft I selected a 4 x 150 drug-coated Ranger balloon and performed balloon angioplasty of the distal bypass graft.  I then used the same balloon to treat proximally.  The balloon was taken to 7 atm.  Completion imaging showed a widely patent bypass graft.  The access site was closed with a Celt  Impression:  #1  Luminal irregularity within the left femoral-popliteal bypass graft treated using a 4 mm drug-coated balloon with no residual stenosis   V. Durene Cal, M.D., [facility] [place] Office: [phone] Pager:  [phone]

## 2022-05-27 ENCOUNTER — Other Ambulatory Visit: Payer: Self-pay | Admitting: Cardiovascular Disease

## 2022-05-30 ENCOUNTER — Ambulatory Visit: Payer: Medicare Other

## 2022-06-06 ENCOUNTER — Ambulatory Visit: Payer: Medicare Other

## 2022-06-06 DIAGNOSIS — I739 Peripheral vascular disease, unspecified: Secondary | ICD-10-CM

## 2022-06-06 LAB — POCT INR
INR: 2.8 (ref 2.0–3.0)
POC INR: 2.8
PT: 33.7

## 2022-06-06 NOTE — Patient Instructions (Signed)
Description   Continue 7.5 mg daily.  Appointment in 4 weeks

## 2022-06-07 ENCOUNTER — Other Ambulatory Visit: Payer: Self-pay | Admitting: Family Medicine

## 2022-06-07 ENCOUNTER — Ambulatory Visit: Payer: Medicare Other | Admitting: Family Medicine

## 2022-06-07 ENCOUNTER — Encounter: Payer: Self-pay | Admitting: Family Medicine

## 2022-06-07 VITALS — BP 114/62 | HR 57 | Temp 98.2°F | Resp 16 | Ht 74.0 in | Wt 186.0 lb

## 2022-06-07 DIAGNOSIS — L858 Other specified epidermal thickening: Secondary | ICD-10-CM

## 2022-06-07 NOTE — Progress Notes (Signed)
I,J'ya E Hunter,acting as a scribe for Jacky Kindle, FNP.,have documented all relevant documentation on the behalf of Jacky Kindle, FNP,as directed by  Jacky Kindle, FNP while in the presence of Jacky Kindle, FNP.  Established patient visit  Patient: [name] [name]   DOB: 11/14/1945   77 y.o. Male  MRN: [id number] Visit Date: 06/07/2022  Today's healthcare provider: Jacky Kindle, FNP  Re Introduced to nurse practitioner role and practice setting.  All questions answered.  Discussed provider/patient relationship and expectations.  Chief Complaint  Patient presents with   Skin Tag   Subjective    Other This is a new (Skin Tag) problem. The current episode started more than 1 month ago. The problem has been gradually worsening. Associated symptoms include a rash. Nothing aggravates the symptoms. Treatments tried: topicals. The treatment provided mild (Softens tag) relief.      Medications: Outpatient Medications Prior to Visit  Medication Sig   Aspirin Buf,CaCarb-MgCarb-MgO, 81 MG TABS Take 1 tablet by mouth daily.   atorvastatin (LIPITOR) 40 MG tablet TAKE 1 TABLET BY MOUTH EVERY DAY   Coenzyme Q10 (CO Q-10) 100 MG CAPS Take 100 capsules by mouth daily.   fenofibrate 54 MG tablet Take 1 tablet (54 mg total) by mouth daily.   metoprolol succinate (TOPROL-XL) 25 MG 24 hr tablet TAKE 1 TABLET (25 MG TOTAL) BY MOUTH DAILY.   Multiple Vitamin (MULTIVITAMIN WITH MINERALS) TABS tablet Take 1 tablet by mouth daily.   Omega-3 Fatty Acids (FISH OIL TRIPLE STRENGTH) 1400 MG CAPS Take 2,800 mg by mouth 2 (two) times daily.   Saw Palmetto 450 MG CAPS Take 900 mg by mouth 2 (two) times daily.   warfarin (COUMADIN) 5 MG tablet TAKE 1 AND 1/2 TABLETS BY MOUTH EVERY DAY (Patient taking differently: Take 7.5 mg by mouth daily. TAKE 1 AND 1/2 TABLETS BY MOUTH EVERY DAY)   fluticasone (FLONASE) 50 MCG/ACT nasal spray Place 2 sprays into both nostrils daily. (Patient not taking: Reported on  05/22/2022)   No facility-administered medications prior to visit.    Review of Systems  Skin:  Positive for rash.     Objective    BP 114/62 (BP Location: Right Arm, Patient Position: Sitting, Cuff Size: Large)   Pulse (!) 57   Temp 98.2 F (36.8 C) (Oral)   Resp 16   Ht  (1.88 m)   Wt 186 lb (84.4 kg)   SpO2 99%   BMI 23.88 kg/m   Physical Exam Vitals and nursing note reviewed.  Constitutional:      Appearance: Normal appearance. He is normal weight.  HENT:     Head: Normocephalic and atraumatic.      Comments: 0.6 mm hard, inflamed, cutaneous horn to R nasal bridge near glasses.  Eyes:     Pupils: Pupils are equal, round, and reactive to light.  Cardiovascular:     Rate and Rhythm: Normal rate and regular rhythm.     Pulses: Normal pulses.     Heart sounds: Normal heart sounds.  Pulmonary:     Effort: Pulmonary effort is normal.     Breath sounds: Normal breath sounds.  Musculoskeletal:        General: Normal range of motion.     Cervical back: Normal range of motion.  Skin:    General: Skin is warm and dry.     Capillary Refill: Capillary refill takes less than 2 seconds.     Findings: Lesion  present.  Neurological:     General: No focal deficit present.     Mental Status: He is alert and oriented to person, place, and time. Mental status is at baseline.    No results found for any visits on 06/07/22.  Assessment & Plan     Problem List Items Addressed This Visit       Musculoskeletal and Integument   Cutaneous horn - Primary    Present for 1 month; advised to see derm to allow for shave vs freeze to eval for SCC present. Not improved with OTC creams. Reports some irritation to site given nearness to glasses. Denies bleeding or s/s of infection.       No follow-ups on file.     Leilani Merl, FNP, have reviewed all documentation for this visit. The documentation on 06/07/22 for the exam, diagnosis, procedures, and orders are all accurate and  complete.  Jacky Kindle, FNP  [facility] [phone] (phone) [phone] (fax)  [facility]

## 2022-06-07 NOTE — Assessment & Plan Note (Signed)
Present for 1 month; advised to see derm to allow for shave vs freeze to eval for SCC present. Not improved with OTC creams. Reports some irritation to site given nearness to glasses. Denies bleeding or s/s of infection.

## 2022-06-07 NOTE — Patient Instructions (Signed)
Referral placed to derm- Dr Cheree Ditto in [place]. Please let us know if you do not hear from her office in 1 week.

## 2022-07-04 ENCOUNTER — Ambulatory Visit: Payer: Medicare Other

## 2022-07-04 DIAGNOSIS — I739 Peripheral vascular disease, unspecified: Secondary | ICD-10-CM

## 2022-07-04 LAB — POCT INR
INR: 3 (ref 2.0–3.0)
POC INR: 3
PT: 36.1

## 2022-07-04 NOTE — Patient Instructions (Signed)
Description   Continue 7.5 mg daily.  Appointment in 4 weeks      

## 2022-08-01 ENCOUNTER — Ambulatory Visit: Payer: Medicare Other

## 2022-08-01 DIAGNOSIS — I739 Peripheral vascular disease, unspecified: Secondary | ICD-10-CM

## 2022-08-01 LAB — POCT INR
INR: 2.4 (ref 2.0–3.0)
POC INR: 2.4
PT: 29.1

## 2022-08-01 NOTE — Patient Instructions (Signed)
.  [place]

## 2022-08-29 ENCOUNTER — Ambulatory Visit: Payer: Medicare Other

## 2022-08-29 DIAGNOSIS — I739 Peripheral vascular disease, unspecified: Secondary | ICD-10-CM

## 2022-08-29 LAB — POCT INR
INR: 2.5 (ref 2.0–3.0)
POC INR: 2.5
PT: 29.5

## 2022-08-29 NOTE — Patient Instructions (Signed)
Description   Continue 7.5 mg daily.  Appointment in 4 weeks

## 2022-09-20 ENCOUNTER — Other Ambulatory Visit: Payer: Self-pay | Admitting: *Deleted

## 2022-09-20 DIAGNOSIS — R972 Elevated prostate specific antigen [PSA]: Secondary | ICD-10-CM

## 2022-09-24 ENCOUNTER — Other Ambulatory Visit: Payer: Medicare Other

## 2022-09-24 DIAGNOSIS — R972 Elevated prostate specific antigen [PSA]: Secondary | ICD-10-CM

## 2022-09-26 ENCOUNTER — Ambulatory Visit: Payer: Medicare Other | Admitting: Urology

## 2022-09-26 ENCOUNTER — Encounter: Payer: Self-pay | Admitting: Urology

## 2022-09-26 ENCOUNTER — Ambulatory Visit: Payer: Medicare Other

## 2022-09-26 VITALS — BP 122/70 | HR 56 | Ht 74.0 in | Wt 186.0 lb

## 2022-09-26 DIAGNOSIS — N401 Enlarged prostate with lower urinary tract symptoms: Secondary | ICD-10-CM

## 2022-09-26 DIAGNOSIS — I739 Peripheral vascular disease, unspecified: Secondary | ICD-10-CM

## 2022-09-26 DIAGNOSIS — R972 Elevated prostate specific antigen [PSA]: Secondary | ICD-10-CM | POA: Diagnosis not present

## 2022-09-26 LAB — POCT INR
INR: 2.7 (ref 2.0–3.0)
PT: 32

## 2022-09-26 NOTE — Patient Instructions (Signed)
Description   Continue 7.5 mg daily.  Appointment in 4 weeks

## 2022-09-26 NOTE — Progress Notes (Signed)
I, Duke Salvia, acting as a scribe for Riki Altes, MD., have documented all relevant documentation on the behalf of Riki Altes, MD, as directed by  Riki Altes, MD while in the presence of Riki Altes, MD.  09/26/2022 10:50 AM   [name] [name] [birth year] [id number]  Referring provider: Jacky Kindle, FNP [address] [place],  [place] [address]  Chief Complaint  Patient presents with   Elevated PSA    Urologic History:  1. Elevated PSA PSA 10.14 December 2021. Prostate MRI 03/09/22 with a 210 cc prostate volume and a Pi-RADS 3 lesions left anterior transition zone; elected surveillance.   HPI: [name] [name] is a 77 y.o. male presents for follow up visit.  Doing well since last visit No bothersome LUTS Denies dysuria, gross hematuria Denies flank, abdominal or pelvic pain  PSA 09/24/22 10.8   PMH: Past Medical History:  Diagnosis Date   Abdominal aortic aneurysm (HCC)    Chronic kidney disease    Right kidney stone   Coronary artery disease    Detached retina    Hyperlipidemia    Hypertension    Myocardial infarction [facility])    2007 heart stent   Popliteal aneurysm [facility])     Surgical History: Past Surgical History:  Procedure Laterality Date   ABDOMINAL AORTIC ANEURYSM REPAIR  11/24/09   EVAR   ABDOMINAL AORTOGRAM W/LOWER EXTREMITY N/A 10/22/2017   Procedure: ABDOMINAL AORTOGRAM W/LOWER EXTREMITY;  Surgeon: Nada Libman, MD;  Location: [facility] INVASIVE CV LAB;  Service: Cardiovascular;  Laterality: N/A;   ABDOMINAL AORTOGRAM W/LOWER EXTREMITY N/A 05/22/2022   Procedure: ABDOMINAL AORTOGRAM W/LOWER EXTREMITY;  Surgeon: Nada Libman, MD;  Location: [facility] INVASIVE CV LAB;  Service: Cardiovascular;  Laterality: N/A;   AMPUTATION Left 12/10/2013   Procedure: AMPUTATION LEFT  FOURTH AND FIFTH TOES;  Surgeon: Nada Libman, MD;  Location: [facility] OR;  Service: Vascular;  Laterality: Left;   COLONOSCOPY     coronary stenting     2007   EYE  SURGERY Right    cataract removed   FEMORAL-POPLITEAL BYPASS GRAFT Left 10/29/2013   Procedure: BYPASS GRAFT FEMORAL-POPLITEAL ARTERY;  Surgeon: Nada Libman, MD;  Location: [facility] OR;  Service: Vascular;  Laterality: Left;  Left Femoral to Posterior Tibial artery bypass graft using nonreversed saphenous vein. and thrombectomy of Tibial arterly.   FEMORAL-TIBIAL BYPASS GRAFT Left 10/30/2013   Procedure: Reexploration of Femoral-Posterior Tibial Bypass; Thrombectomy of Bypass; Left Leg Angiogram; Posterior Tibial Vein Patch Angioplasty;  Surgeon: Nada Libman, MD;  Location: [facility] OR;  Service: Vascular;  Laterality: Left;   LOWER EXTREMITY ANGIOGRAM N/A 05/25/2014   Procedure: LOWER EXTREMITY ANGIOGRAM;  Surgeon: Nada Libman, MD;  Location: [facility] CATH LAB;  Service: Cardiovascular;  Laterality: N/A;   PERIPHERAL VASCULAR BALLOON ANGIOPLASTY Left 10/22/2017   Procedure: PERIPHERAL VASCULAR BALLOON ANGIOPLASTY;  Surgeon: Nada Libman, MD;  Location: [facility] INVAS[facility] CV LAB;  Service: Cardiovascular;  Laterality: Left;  fem pop bypass   PERIPHERAL VASCULAR CATHETERIZATION Left 10/04/2015   Procedure: Lower Extremity Angiography;  Surgeon: Nada Libman, MD;  Location[facility] INVASIVE CV LAB;  Service: Cardiovascular;  Laterality: Left;   PERIPHERAL VASCULAR CATHETERIZATION Left 10/04/2015   Procedure: Peripheral Vascular Intervention;  Surgeon: Nada Libman, MD;  Location: [facility] INVASIVE CV LAB;  Service: Cardiovascular;  Laterality: Left;   POPLITEAL ARTERY STENT  11/2009   left popliteal artery    RETINAL DETACHMENT SURGERY Right 2011  TOOTH EXTRACTION  June 2015   Wisdom Tooth Extraction    Home Medications:  Allergies as of 09/26/2022       Reactions   Omnipaque [iohexol] Rash, Other (See Comments)   Pt requires full premeds and needs to bring a driver.  Broke out in rash on back and arms.  Ok w/ premeds   Oxytetracycline Rash, Other (See Comments)   Terramycin causes rash   Alteplase Other (See  Comments)   Internal bleeding, per RN in short stay. Patient requested this to be added to his allergy list        Medication List        Accurate as of September 26, 2022 10:50 AM. If you have any questions, ask your nurse or doctor.          Aspirin Buf(CaCarb-MgCarb-MgO) 81 MG Tabs Take 1 tablet by mouth daily.   atorvastatin 40 MG tablet Commonly known as: LIPITOR TAKE 1 TABLET BY MOUTH EVERY DAY   Co Q-10 100 MG Caps Take 100 capsules by mouth daily.   fenofibrate 54 MG tablet Take 1 tablet (54 mg total) by mouth daily.   Fish Oil Triple Strength 1400 MG Caps Take 2,800 mg by mouth 2 (two) times daily.   fluticasone 50 MCG/ACT nasal spray Commonly known as: FLONASE Place 2 sprays into both nostrils daily.   metoprolol succinate 25 MG 24 hr tablet Commonly known as: TOPROL-XL TAKE 1 TABLET (25 MG TOTAL) BY MOUTH DAILY.   multivitamin with minerals Tabs tablet Take 1 tablet by mouth daily.   Saw Palmetto 450 MG Caps Take 900 mg by mouth 2 (two) times daily.   warfarin 5 MG tablet Commonly known as: COUMADIN Take as directed by the anticoagulation clinic. If you are unsure how to take this medication, talk to your nurse or doctor. Original instructions: TAKE 1 AND 1/2 TABLETS BY MOUTH EVERY DAY What changed:  how much to take how to take this when to take this        Allergies:  Allergies  Allergen Reactions   Omnipaque [Iohexol] Rash and Other (See Comments)    Pt requires full premeds and needs to bring a driver.  Broke out in rash on back and arms.  Ok w/ premeds   Oxytetracycline Rash and Other (See Comments)    Terramycin causes rash   Alteplase Other (See Comments)    Internal bleeding, per RN in short stay. Patient requested this to be added to his allergy list    Family History: Family History  Problem Relation Age of Onset   Aneurysm Sister        possibly    Peripheral vascular disease Brother     Social History:  reports that he  quit smoking about 37 years ago. His smoking use included cigarettes. He started smoking about 52 years ago. His smokeless tobacco use includes chew. He reports current alcohol use. He reports that he does not use drugs.   Physical Exam: BP 122/70   Pulse (!) 56   Ht 6\' 2"  (1.88 m)   Wt 186 lb (84.4 kg)   BMI 23.88 kg/m   Constitutional:  Alert and oriented, No acute distress. HEENT: [place] AT Respiratory: Normal respiratory effort, no increased work of breathing. Psychiatric: Normal mood and affect.    Assessment & Plan:    1.  Elevated PSA Recent MRI with Pi-RADS 3 lesion though prostate volume 210 g (PSA density 0.05). PSA stable. He desires surveillance and  will schedule a lab visit in 6 months with PSA and office visit in 1 year with PSA.  2. BPH with mild LUTS Prostate volume 210 cc on recent MRI. He does not desire any treatment for his enlarged prostate based on minimal symptoms. We discussed prophylactic 5-ARI, though he has declined. He will return as needed for worsening voiding symptoms.  I have reviewed the above documentation for accuracy and completeness, and I agree with the above.   Riki Altes, MD  [facility] [address] [place], [place] [address] [phone]

## 2022-09-27 ENCOUNTER — Encounter: Payer: Self-pay | Admitting: Urology

## 2022-10-24 ENCOUNTER — Ambulatory Visit: Payer: Medicare Other

## 2022-10-24 DIAGNOSIS — I739 Peripheral vascular disease, unspecified: Secondary | ICD-10-CM

## 2022-10-24 LAB — POCT INR
INR: 3.1 — AB (ref 2.0–3.0)
POC INR: 3.1
PT: 37.5

## 2022-10-24 NOTE — Patient Instructions (Signed)
Description   Continue 7.5 mg daily.  Appointment in 4 weeks

## 2022-11-09 ENCOUNTER — Other Ambulatory Visit: Payer: Self-pay | Admitting: *Deleted

## 2022-11-09 DIAGNOSIS — I739 Peripheral vascular disease, unspecified: Secondary | ICD-10-CM

## 2022-11-09 DIAGNOSIS — Z9889 Other specified postprocedural states: Secondary | ICD-10-CM

## 2022-11-09 DIAGNOSIS — I724 Aneurysm of artery of lower extremity: Secondary | ICD-10-CM

## 2022-11-09 DIAGNOSIS — R0989 Other specified symptoms and signs involving the circulatory and respiratory systems: Secondary | ICD-10-CM

## 2022-11-19 ENCOUNTER — Ambulatory Visit: Payer: Medicare Other | Admitting: Surgery

## 2022-11-19 ENCOUNTER — Ambulatory Visit (INDEPENDENT_AMBULATORY_CARE_PROVIDER_SITE_OTHER)
Admission: RE | Admit: 2022-11-19 | Discharge: 2022-11-19 | Disposition: A | Discharge status: Home / Self Care | Payer: Medicare Other | Source: Ambulatory Visit | Attending: Surgery | Admitting: Surgery

## 2022-11-19 ENCOUNTER — Ambulatory Visit (HOSPITAL_COMMUNITY)
Admission: RE | Admit: 2022-11-19 | Discharge: 2022-11-19 | Disposition: A | Discharge status: Home / Self Care | Payer: Medicare Other | Source: Ambulatory Visit | Attending: Surgery | Admitting: Surgery

## 2022-11-19 ENCOUNTER — Encounter: Payer: Self-pay | Admitting: Surgery

## 2022-11-19 VITALS — BP 110/64 | HR 50 | Temp 97.7°F | Resp 20 | Ht 74.0 in | Wt 200.0 lb

## 2022-11-19 DIAGNOSIS — R0989 Other specified symptoms and signs involving the circulatory and respiratory systems: Secondary | ICD-10-CM | POA: Insufficient documentation

## 2022-11-19 DIAGNOSIS — I724 Aneurysm of artery of lower extremity: Secondary | ICD-10-CM

## 2022-11-19 DIAGNOSIS — I7143 Infrarenal abdominal aortic aneurysm, without rupture: Secondary | ICD-10-CM | POA: Diagnosis not present

## 2022-11-19 DIAGNOSIS — I739 Peripheral vascular disease, unspecified: Secondary | ICD-10-CM

## 2022-11-19 DIAGNOSIS — I6523 Occlusion and stenosis of bilateral carotid arteries: Secondary | ICD-10-CM

## 2022-11-19 DIAGNOSIS — Z9889 Other specified postprocedural states: Secondary | ICD-10-CM | POA: Diagnosis not present

## 2022-11-19 LAB — VAS US ABI WITH/WO TBI
Left ABI: 0.67
Right ABI: 0.89

## 2022-11-19 NOTE — Progress Notes (Signed)
[facility] [place]  Patient name: [name] [name] MRN: [id number] DOB: 18-Jan-1946 Sex: male   REASON FOR VISIT:    Follow up  [name]:    [name] [name] is a 77 y.o. male who is back today for follow-up.  He has undergone the following procedures:               -October 2011: Endovascular aneurysm repair, with known type II endoleak             -October 2011: Endovascular repair of left popliteal aneurysm             -October 29, 2013: Femoral posterior tibial bypass graft: This was performed after failed attempted lytic therapy for occluded popliteal stents.  The bypass graft occluded early requiring reexploration.  The posterior tibial artery was opened behind the ankle and Fogarty catheters were able to be passed around the pedal arch.             -05/25/2014: Drug-coated balloon angioplasty to the distal anastomosis of his left leg bypass             -10/22/2017: Drug-coated balloon angioplasty, left distal anastomosis of his left leg bypass  -05/22/2022: Drug-coated balloon angioplasty, left femoral-popliteal bypass graft     The patient continues to take a statin for hypercholesterolemia.  He is on Coumadin.  He is also being followed for a right popliteal aneurysm with maximum diameter of 1.7 cm as well as a abdominal aortic aneurysm, status post EVAR.  He is back for follow-up.   PAST MEDICAL HISTORY:   Past Medical History:  Diagnosis Date   Abdominal aortic aneurysm (HCC)    Chronic kidney disease    Right kidney stone   Coronary artery disease    Detached retina    Hyperlipidemia    Hypertension    Myocardial infarction [facility])    2007 heart stent   Popliteal aneurysm (HCC)      FAMILY HISTORY:   Family History  Problem Relation Age of Onset   Aneurysm Sister        possibly    Peripheral vascular disease Brother     SOCIAL HISTORY:   Social History   Tobacco Use    Smoking status: Former    Current packs/day: 0.00    Types: Cigarettes    Start date: 09/22/1970    Quit date: 09/21/1985    Years since quitting: 37.1   Smokeless tobacco: Current    Types: Chew   Tobacco comments:    once a day; chews tobacco  Substance Use Topics   Alcohol use: Yes    Alcohol/week: 0.0 - 1.0 standard drinks of alcohol    Comment: 1 beer ocassionally (mostly in summer)     ALLERGIES:   Allergies  Allergen Reactions   Omnipaque [Iohexol] Rash and Other (See Comments)    Pt requires full premeds and needs to bring a driver.  Broke out in rash on back and arms.  Ok w/ premeds   Oxytetracycline Rash and Other (See Comments)    Terramycin causes rash   Alteplase Other (See Comments)    Internal bleeding, per RN in short stay. Patient requested this to be added to his allergy list     CURRENT MEDICATIONS:   Current Outpatient Medications  Medication Sig Dispense Refill   Aspirin Buf,CaCarb-MgCarb-MgO, 81 MG TABS Take 1 tablet by mouth daily.     atorvastatin (LIPITOR) 40 MG  [facility] [place]  Patient name: [name] [name] MRN: [id number] DOB: 18-Jan-1946 Sex: male   REASON FOR VISIT:    Follow up  [name]:    [name] [name] is a 77 y.o. male who is back today for follow-up.  He has undergone the following procedures:               -October 2011: Endovascular aneurysm repair, with known type II endoleak             -October 2011: Endovascular repair of left popliteal aneurysm             -October 29, 2013: Femoral posterior tibial bypass graft: This was performed after failed attempted lytic therapy for occluded popliteal stents.  The bypass graft occluded early requiring reexploration.  The posterior tibial artery was opened behind the ankle and Fogarty catheters were able to be passed around the pedal arch.             -05/25/2014: Drug-coated balloon angioplasty to the distal anastomosis of his left leg bypass             -10/22/2017: Drug-coated balloon angioplasty, left distal anastomosis of his left leg bypass  -05/22/2022: Drug-coated balloon angioplasty, left femoral-popliteal bypass graft     The patient continues to take a statin for hypercholesterolemia.  He is on Coumadin.  He is also being followed for a right popliteal aneurysm with maximum diameter of 1.7 cm as well as a abdominal aortic aneurysm, status post EVAR.  He is back for follow-up.   PAST MEDICAL HISTORY:   Past Medical History:  Diagnosis Date   Abdominal aortic aneurysm (HCC)    Chronic kidney disease    Right kidney stone   Coronary artery disease    Detached retina    Hyperlipidemia    Hypertension    Myocardial infarction [facility])    2007 heart stent   Popliteal aneurysm (HCC)      FAMILY HISTORY:   Family History  Problem Relation Age of Onset   Aneurysm Sister        possibly    Peripheral vascular disease Brother     SOCIAL HISTORY:   Social History   Tobacco Use    Smoking status: Former    Current packs/day: 0.00    Types: Cigarettes    Start date: 09/22/1970    Quit date: 09/21/1985    Years since quitting: 37.1   Smokeless tobacco: Current    Types: Chew   Tobacco comments:    once a day; chews tobacco  Substance Use Topics   Alcohol use: Yes    Alcohol/week: 0.0 - 1.0 standard drinks of alcohol    Comment: 1 beer ocassionally (mostly in summer)     ALLERGIES:   Allergies  Allergen Reactions   Omnipaque [Iohexol] Rash and Other (See Comments)    Pt requires full premeds and needs to bring a driver.  Broke out in rash on back and arms.  Ok w/ premeds   Oxytetracycline Rash and Other (See Comments)    Terramycin causes rash   Alteplase Other (See Comments)    Internal bleeding, per RN in short stay. Patient requested this to be added to his allergy list     CURRENT MEDICATIONS:   Current Outpatient Medications  Medication Sig Dispense Refill   Aspirin Buf,CaCarb-MgCarb-MgO, 81 MG TABS Take 1 tablet by mouth daily.     atorvastatin (LIPITOR) 40 MG  [facility] [place]  Patient name: [name] [name] MRN: [id number] DOB: 18-Jan-1946 Sex: male   REASON FOR VISIT:    Follow up  [name]:    [name] [name] is a 77 y.o. male who is back today for follow-up.  He has undergone the following procedures:               -October 2011: Endovascular aneurysm repair, with known type II endoleak             -October 2011: Endovascular repair of left popliteal aneurysm             -October 29, 2013: Femoral posterior tibial bypass graft: This was performed after failed attempted lytic therapy for occluded popliteal stents.  The bypass graft occluded early requiring reexploration.  The posterior tibial artery was opened behind the ankle and Fogarty catheters were able to be passed around the pedal arch.             -05/25/2014: Drug-coated balloon angioplasty to the distal anastomosis of his left leg bypass             -10/22/2017: Drug-coated balloon angioplasty, left distal anastomosis of his left leg bypass  -05/22/2022: Drug-coated balloon angioplasty, left femoral-popliteal bypass graft     The patient continues to take a statin for hypercholesterolemia.  He is on Coumadin.  He is also being followed for a right popliteal aneurysm with maximum diameter of 1.7 cm as well as a abdominal aortic aneurysm, status post EVAR.  He is back for follow-up.   PAST MEDICAL HISTORY:   Past Medical History:  Diagnosis Date   Abdominal aortic aneurysm (HCC)    Chronic kidney disease    Right kidney stone   Coronary artery disease    Detached retina    Hyperlipidemia    Hypertension    Myocardial infarction [facility])    2007 heart stent   Popliteal aneurysm (HCC)      FAMILY HISTORY:   Family History  Problem Relation Age of Onset   Aneurysm Sister        possibly    Peripheral vascular disease Brother     SOCIAL HISTORY:   Social History   Tobacco Use    Smoking status: Former    Current packs/day: 0.00    Types: Cigarettes    Start date: 09/22/1970    Quit date: 09/21/1985    Years since quitting: 37.1   Smokeless tobacco: Current    Types: Chew   Tobacco comments:    once a day; chews tobacco  Substance Use Topics   Alcohol use: Yes    Alcohol/week: 0.0 - 1.0 standard drinks of alcohol    Comment: 1 beer ocassionally (mostly in summer)     ALLERGIES:   Allergies  Allergen Reactions   Omnipaque [Iohexol] Rash and Other (See Comments)    Pt requires full premeds and needs to bring a driver.  Broke out in rash on back and arms.  Ok w/ premeds   Oxytetracycline Rash and Other (See Comments)    Terramycin causes rash   Alteplase Other (See Comments)    Internal bleeding, per RN in short stay. Patient requested this to be added to his allergy list     CURRENT MEDICATIONS:   Current Outpatient Medications  Medication Sig Dispense Refill   Aspirin Buf,CaCarb-MgCarb-MgO, 81 MG TABS Take 1 tablet by mouth daily.     atorvastatin (LIPITOR) 40 MG

## 2022-11-19 NOTE — H&P (View-Only) (Signed)
[facility] [place]  Patient name: [name] [name] MRN: [id number] DOB: 18-Jan-1946 Sex: male   REASON FOR VISIT:    Follow up  [name]:    [name] is a 77 y.o. male who is back today for follow-up.  He has undergone the following procedures:               -October 2011: Endovascular aneurysm repair, with known type II endoleak             -October 2011: Endovascular repair of left popliteal aneurysm             -October 29, 2013: Femoral posterior tibial bypass graft: This was performed after failed attempted lytic therapy for occluded popliteal stents.  The bypass graft occluded early requiring reexploration.  The posterior tibial artery was opened behind the ankle and Fogarty catheters were able to be passed around the pedal arch.             -05/25/2014: Drug-coated balloon angioplasty to the distal anastomosis of his left leg bypass             -10/22/2017: Drug-coated balloon angioplasty, left distal anastomosis of his left leg bypass  -05/22/2022: Drug-coated balloon angioplasty, left femoral-popliteal bypass graft     The patient continues to take a statin for hypercholesterolemia.  He is on Coumadin.  He is also being followed for a right popliteal aneurysm with maximum diameter of 1.7 cm as well as a abdominal aortic aneurysm, status post EVAR.  He is back for follow-up.   PAST MEDICAL HISTORY:   Past Medical History:  Diagnosis Date   Abdominal aortic aneurysm (HCC)    Chronic kidney disease    Right kidney stone   Coronary artery disease    Detached retina    Hyperlipidemia    Hypertension    Myocardial infarction [facility])    2007 heart stent   Popliteal aneurysm (HCC)      FAMILY HISTORY:   Family History  Problem Relation Age of Onset   Aneurysm Sister        possibly    Peripheral vascular disease Brother     SOCIAL HISTORY:   Social History   Tobacco Use    Smoking status: Former    Current packs/day: 0.00    Types: Cigarettes    Start date: 09/22/1970    Quit date: 09/21/1985    Years since quitting: 37.1   Smokeless tobacco: Current    Types: Chew   Tobacco comments:    once a day; chews tobacco  Substance Use Topics   Alcohol use: Yes    Alcohol/week: 0.0 - 1.0 standard drinks of alcohol    Comment: 1 beer ocassionally (mostly in summer)     ALLERGIES:   Allergies  Allergen Reactions   Omnipaque [Iohexol] Rash and Other (See Comments)    Pt requires full premeds and needs to bring a driver.  Broke out in rash on back and arms.  Ok w/ premeds   Oxytetracycline Rash and Other (See Comments)    Terramycin causes rash   Alteplase Other (See Comments)    Internal bleeding, per RN in short stay. Patient requested this to be added to his allergy list     CURRENT MEDICATIONS:   Current Outpatient Medications  Medication Sig Dispense Refill   Aspirin Buf,CaCarb-MgCarb-MgO, 81 MG TABS Take 1 tablet by mouth daily.     atorvastatin (LIPITOR) 40 MG  [facility] [place]  Patient name: [name] [name] MRN: [id number] DOB: 18-Jan-1946 Sex: male   REASON FOR VISIT:    Follow up  [name]:    [name] is a 77 y.o. male who is back today for follow-up.  He has undergone the following procedures:               -October 2011: Endovascular aneurysm repair, with known type II endoleak             -October 2011: Endovascular repair of left popliteal aneurysm             -October 29, 2013: Femoral posterior tibial bypass graft: This was performed after failed attempted lytic therapy for occluded popliteal stents.  The bypass graft occluded early requiring reexploration.  The posterior tibial artery was opened behind the ankle and Fogarty catheters were able to be passed around the pedal arch.             -05/25/2014: Drug-coated balloon angioplasty to the distal anastomosis of his left leg bypass             -10/22/2017: Drug-coated balloon angioplasty, left distal anastomosis of his left leg bypass  -05/22/2022: Drug-coated balloon angioplasty, left femoral-popliteal bypass graft     The patient continues to take a statin for hypercholesterolemia.  He is on Coumadin.  He is also being followed for a right popliteal aneurysm with maximum diameter of 1.7 cm as well as a abdominal aortic aneurysm, status post EVAR.  He is back for follow-up.   PAST MEDICAL HISTORY:   Past Medical History:  Diagnosis Date   Abdominal aortic aneurysm (HCC)    Chronic kidney disease    Right kidney stone   Coronary artery disease    Detached retina    Hyperlipidemia    Hypertension    Myocardial infarction [facility])    2007 heart stent   Popliteal aneurysm (HCC)      FAMILY HISTORY:   Family History  Problem Relation Age of Onset   Aneurysm Sister        possibly    Peripheral vascular disease Brother     SOCIAL HISTORY:   Social History   Tobacco Use    Smoking status: Former    Current packs/day: 0.00    Types: Cigarettes    Start date: 09/22/1970    Quit date: 09/21/1985    Years since quitting: 37.1   Smokeless tobacco: Current    Types: Chew   Tobacco comments:    once a day; chews tobacco  Substance Use Topics   Alcohol use: Yes    Alcohol/week: 0.0 - 1.0 standard drinks of alcohol    Comment: 1 beer ocassionally (mostly in summer)     ALLERGIES:   Allergies  Allergen Reactions   Omnipaque [Iohexol] Rash and Other (See Comments)    Pt requires full premeds and needs to bring a driver.  Broke out in rash on back and arms.  Ok w/ premeds   Oxytetracycline Rash and Other (See Comments)    Terramycin causes rash   Alteplase Other (See Comments)    Internal bleeding, per RN in short stay. Patient requested this to be added to his allergy list     CURRENT MEDICATIONS:   Current Outpatient Medications  Medication Sig Dispense Refill   Aspirin Buf,CaCarb-MgCarb-MgO, 81 MG TABS Take 1 tablet by mouth daily.     atorvastatin (LIPITOR) 40 MG  [facility] [place]  Patient name: [name] [name] MRN: [id number] DOB: 18-Jan-1946 Sex: male   REASON FOR VISIT:    Follow up  [name]:    [name] is a 77 y.o. male who is back today for follow-up.  He has undergone the following procedures:               -October 2011: Endovascular aneurysm repair, with known type II endoleak             -October 2011: Endovascular repair of left popliteal aneurysm             -October 29, 2013: Femoral posterior tibial bypass graft: This was performed after failed attempted lytic therapy for occluded popliteal stents.  The bypass graft occluded early requiring reexploration.  The posterior tibial artery was opened behind the ankle and Fogarty catheters were able to be passed around the pedal arch.             -05/25/2014: Drug-coated balloon angioplasty to the distal anastomosis of his left leg bypass             -10/22/2017: Drug-coated balloon angioplasty, left distal anastomosis of his left leg bypass  -05/22/2022: Drug-coated balloon angioplasty, left femoral-popliteal bypass graft     The patient continues to take a statin for hypercholesterolemia.  He is on Coumadin.  He is also being followed for a right popliteal aneurysm with maximum diameter of 1.7 cm as well as a abdominal aortic aneurysm, status post EVAR.  He is back for follow-up.   PAST MEDICAL HISTORY:   Past Medical History:  Diagnosis Date   Abdominal aortic aneurysm (HCC)    Chronic kidney disease    Right kidney stone   Coronary artery disease    Detached retina    Hyperlipidemia    Hypertension    Myocardial infarction [facility])    2007 heart stent   Popliteal aneurysm (HCC)      FAMILY HISTORY:   Family History  Problem Relation Age of Onset   Aneurysm Sister        possibly    Peripheral vascular disease Brother     SOCIAL HISTORY:   Social History   Tobacco Use    Smoking status: Former    Current packs/day: 0.00    Types: Cigarettes    Start date: 09/22/1970    Quit date: 09/21/1985    Years since quitting: 37.1   Smokeless tobacco: Current    Types: Chew   Tobacco comments:    once a day; chews tobacco  Substance Use Topics   Alcohol use: Yes    Alcohol/week: 0.0 - 1.0 standard drinks of alcohol    Comment: 1 beer ocassionally (mostly in summer)     ALLERGIES:   Allergies  Allergen Reactions   Omnipaque [Iohexol] Rash and Other (See Comments)    Pt requires full premeds and needs to bring a driver.  Broke out in rash on back and arms.  Ok w/ premeds   Oxytetracycline Rash and Other (See Comments)    Terramycin causes rash   Alteplase Other (See Comments)    Internal bleeding, per RN in short stay. Patient requested this to be added to his allergy list     CURRENT MEDICATIONS:   Current Outpatient Medications  Medication Sig Dispense Refill   Aspirin Buf,CaCarb-MgCarb-MgO, 81 MG TABS Take 1 tablet by mouth daily.     atorvastatin (LIPITOR) 40 MG

## 2022-11-20 ENCOUNTER — Other Ambulatory Visit: Payer: Self-pay

## 2022-11-20 DIAGNOSIS — I6523 Occlusion and stenosis of bilateral carotid arteries: Secondary | ICD-10-CM

## 2022-11-20 MED ORDER — DIPHENHYDRAMINE HCL 50 MG PO CAPS: ORAL_CAPSULE | ORAL | 0 refills | Status: DC

## 2022-11-20 MED ORDER — PREDNISONE 50 MG PO TABS: ORAL_TABLET | ORAL | 0 refills | Status: DC

## 2022-11-21 ENCOUNTER — Ambulatory Visit: Payer: Medicare Other

## 2022-11-21 DIAGNOSIS — I739 Peripheral vascular disease, unspecified: Secondary | ICD-10-CM

## 2022-11-21 LAB — POCT INR
INR: 2.2 (ref 2.0–3.0)
POC INR: 2.2
PT: 26.9

## 2022-11-21 NOTE — Patient Instructions (Signed)
Description   Continue 7.5 mg daily.  Appointment in 3 weeks

## 2022-11-29 NOTE — Telephone Encounter (Signed)
error 

## 2022-11-30 ENCOUNTER — Other Ambulatory Visit: Payer: Medicare Other

## 2022-11-30 ENCOUNTER — Ambulatory Visit (HOSPITAL_COMMUNITY)
Admission: RE | Admit: 2022-11-30 | Discharge: 2022-11-30 | Disposition: A | Discharge status: Home / Self Care | Payer: Medicare Other | Source: Ambulatory Visit | Attending: Surgery | Admitting: Surgery

## 2022-11-30 DIAGNOSIS — I6523 Occlusion and stenosis of bilateral carotid arteries: Secondary | ICD-10-CM | POA: Insufficient documentation

## 2022-11-30 MED ORDER — IOHEXOL 350 MG/ML SOLN
75.0000 mL | Freq: Once | INTRAVENOUS | Status: AC | PRN
  Administered 2022-11-30: 75 mL via INTRAVENOUS

## 2022-12-02 NOTE — Progress Notes (Unsigned)
[facility] [place]  Patient name: [name] [name] MRN: [id number] DOB: 03/18/1945 Sex: male   REASON FOR VISIT:    Follow up  [name]:    [name] [name] is a 77 y.o. male who is back today for follow-up.  He has undergone the following procedures:               -October 2011: Endovascular aneurysm repair, with known type II endoleak             -October 2011: Endovascular repair of left popliteal aneurysm             -October 29, 2013: Femoral posterior tibial bypass graft: This was performed after failed attempted lytic therapy for occluded popliteal stents.  The bypass graft occluded early requiring reexploration.  The posterior tibial artery was opened behind the ankle and Fogarty catheters were able to be passed around the pedal arch.             -05/25/2014: Drug-coated balloon angioplasty to the distal anastomosis of his left leg bypass             -10/22/2017: Drug-coated balloon angioplasty, left distal anastomosis of his left leg bypass             -05/22/2022: Drug-coated balloon angioplasty, left femoral-popliteal bypass graft     The patient continues to take a statin for hypercholesterolemia.  He is on Coumadin.  He is also being followed for a right popliteal aneurysm with maximum diameter of 1.7 cm as well as a abdominal aortic aneurysm, status post EVAR.  At his last visit, his carotid duplex showed greater than 80% right-sided stenosis.  This was normal in 2017.  I sent him for a CT scan to better evaluate this.  PAST MEDICAL HISTORY:   Past Medical History:  Diagnosis Date   Abdominal aortic aneurysm (HCC)    Chronic kidney disease    Right kidney stone   Coronary artery disease    Detached retina    Hyperlipidemia    Hypertension    Myocardial infarction [facility])    2007 heart stent   Popliteal aneurysm (HCC)      FAMILY HISTORY:   Family History  Problem Relation Age of  Onset   Aneurysm Sister        possibly    Peripheral vascular disease Brother     SOCIAL HISTORY:   Social History   Tobacco Use   Smoking status: Former    Current packs/day: 0.00    Types: Cigarettes    Start date: 09/22/1970    Quit date: 09/21/1985    Years since quitting: 37.2   Smokeless tobacco: Current    Types: Chew   Tobacco comments:    once a day; chews tobacco  Substance Use Topics   Alcohol use: Yes    Alcohol/week: 0.0 - 1.0 standard drinks of alcohol    Comment: 1 beer ocassionally (mostly in summer)     ALLERGIES:   Allergies  Allergen Reactions   Omnipaque [Iohexol] Rash and Other (See Comments)    Pt requires full premeds and needs to bring a driver.  Broke out in rash on back and arms.  Ok w/ premeds   Oxytetracycline Rash and Other (See Comments)    Terramycin causes rash   Alteplase Other (See Comments)    Internal bleeding, per RN in short stay. Patient requested this to be added to his allergy list  CURRENT MEDICATIONS:   Current Outpatient Medications  Medication Sig Dispense Refill   Aspirin Buf,CaCarb-MgCarb-MgO, 81 MG TABS Take 1 tablet by mouth daily.     atorvastatin (LIPITOR) 40 MG tablet TAKE 1 TABLET BY MOUTH EVERY DAY 90 tablet 3   Coenzyme Q10 (CO Q-10) 100 MG CAPS Take 100 capsules by mouth daily.  0   diphenhydrAMINE (BENADRYL) 50 MG capsule Take one capsule 1 hour prior to scan. 1 capsule 0   fenofibrate 54 MG tablet Take 1 tablet (54 mg total) by mouth daily. 90 tablet 3   fluticasone (FLONASE) 50 MCG/ACT nasal spray Place 2 sprays into both nostrils daily. 16 g 6   metoprolol succinate (TOPROL-XL) 25 MG 24 hr tablet TAKE 1 TABLET (25 MG TOTAL) BY MOUTH DAILY. 90 tablet 3   Multiple Vitamin (MULTIVITAMIN WITH MINERALS) TABS tablet Take 1 tablet by mouth daily.     Omega-3 Fatty Acids (FISH OIL TRIPLE STRENGTH) 1400 MG CAPS Take 2,800 mg by mouth 2 (two) times daily.     predniSONE (DELTASONE) 50 MG tablet Take one tablet  13 hours, 7 hours, and 1 hour prior to scan. 3 tablet 0   Saw Palmetto 450 MG CAPS Take 900 mg by mouth 2 (two) times daily.     warfarin (COUMADIN) 5 MG tablet TAKE 1 AND 1/2 TABLETS BY MOUTH EVERY DAY (Patient taking differently: Take 7.5 mg by mouth daily. TAKE 1 AND 1/2 TABLETS BY MOUTH EVERY DAY) 135 tablet 2   No current facility-administered medications for this visit.    REVIEW OF SYSTEMS:   [X]  denotes positive finding, [ ]  denotes negative finding Cardiac  Comments:  Chest pain or chest pressure: ***   Shortness of breath upon exertion:    Short of breath when lying flat:    Irregular heart rhythm:        Vascular    Pain in calf, thigh, or hip brought on by ambulation:    Pain in feet at night that wakes you up from your sleep:     Blood clot in your veins:    Leg swelling:         Pulmonary    Oxygen at home:    Productive cough:     Wheezing:         Neurologic    Sudden weakness in arms or legs:     Sudden numbness in arms or legs:     Sudden onset of difficulty speaking or slurred speech:    Temporary loss of vision in one eye:     Problems with dizziness:         Gastrointestinal    Blood in stool:     Vomited blood:         Genitourinary    Burning when urinating:     Blood in urine:        Psychiatric    Major depression:         Hematologic    Bleeding problems:    Problems with blood clotting too easily:        Skin    Rashes or ulcers:        Constitutional    Fever or chills:      PHYSICAL EXAM:   There were no vitals filed for this visit.  GENERAL: The patient is a well-nourished male, in no acute distress. The vital signs are documented above. CARDIAC: There is a regular rate and rhythm.  VASCULAR: *** PULMONARY:  Non-labored respirations ABDOMEN: Soft and non-tender with normal pitched bowel sounds.  MUSCULOSKELETAL: There are no major deformities or cyanosis. NEUROLOGIC: No focal weakness or paresthesias are detected. SKIN:  There are no ulcers or rashes noted. PSYCHIATRIC: The patient has a normal affect.  STUDIES:   ***  MEDICAL ISSUES:   ***    Charlena Cross, MD, FACS [facility] [phone] Pager [phone]

## 2022-12-03 ENCOUNTER — Ambulatory Visit: Payer: Medicare Other | Admitting: Surgery

## 2022-12-03 ENCOUNTER — Encounter: Payer: Self-pay | Admitting: Surgery

## 2022-12-03 ENCOUNTER — Other Ambulatory Visit: Payer: Self-pay

## 2022-12-03 VITALS — BP 135/76 | HR 58 | Temp 98.2°F | Resp 20 | Ht 74.0 in | Wt 201.0 lb

## 2022-12-03 DIAGNOSIS — I6521 Occlusion and stenosis of right carotid artery: Secondary | ICD-10-CM

## 2022-12-05 ENCOUNTER — Telehealth: Payer: Self-pay

## 2022-12-05 ENCOUNTER — Ambulatory Visit: Payer: Medicare Other

## 2022-12-05 DIAGNOSIS — I739 Peripheral vascular disease, unspecified: Secondary | ICD-10-CM | POA: Diagnosis not present

## 2022-12-05 LAB — POCT INR: INR: 2.9 (ref 2.0–3.0)

## 2022-12-05 NOTE — Telephone Encounter (Signed)
Called patient and LV that his appointment is for today at 3:40pm.

## 2022-12-05 NOTE — Telephone Encounter (Signed)
Copied from CRM [id number]. Topic: Appointment Scheduling - Scheduling Inquiry for Clinic >> Dec 04, 2022  1:55 PM Lennox Pippins wrote: Patient has called and states he is scheduled for neck surgery on 12/14/2022 & has to stop all Warfarin on 12/10/2022. Patient is currently scheduled for PT/INR/COUMADIN on 12/12/22 and wanted to know if PCP wants him to have this done on 12/10/2022 or what to do? Please follow back up with patient @ [phone], okay to leave vm if needed.

## 2022-12-05 NOTE — Patient Instructions (Signed)
Description   Continue 7.5 mg daily.  Appointment in 3 weeks

## 2022-12-11 NOTE — Pre-Procedure Instructions (Signed)
Surgical Instructions   Your procedure is scheduled on Friday, November 1st. Report to [facility] Main Entrance "A" at 05:30 A.M., then check in with the Admitting office. Any questions or running late day of surgery: call [phone]  Questions prior to your surgery date: call [phone], Monday-Friday, 8am-4pm. If you experience any cold or flu symptoms such as cough, fever, chills, shortness of breath, etc. between now and your scheduled surgery, please notify us at the above number.     Remember:  Do not eat or drink after midnight the night before your surgery    Take these medicines the morning of surgery with A SIP OF WATER  atorvastatin (LIPITOR)  fenofibrate  metoprolol succinate (TOPROL-XL)     Follow your surgeon's instructions on when to stop warfarin (COUMADIN).  If no instructions were given by your surgeon then you will need to call the office to get those instructions.    One week prior to surgery, STOP taking any Aleve, Naproxen, Ibuprofen, Motrin, Advil, Goody's, BC's, all herbal medications, fish oil, and non-prescription vitamins.                     Do NOT Smoke (Tobacco/Vaping) for 24 hours prior to your procedure.  If you use a CPAP at night, you may bring your mask/headgear for your overnight stay.   You will be asked to remove any contacts, glasses, piercing's, hearing aid's, dentures/partials prior to surgery. Please bring cases for these items if needed.    Patients discharged the day of surgery will not be allowed to drive home, and someone needs to stay with them for 24 hours.  SURGICAL WAITING ROOM VISITATION Patients may have no more than 2 support people in the waiting area - these visitors may rotate.   Pre-op nurse will coordinate an appropriate time for 1 ADULT support person, who may not rotate, to accompany patient in pre-op.  Children under the age of 63 must have an adult with them who is not the patient and must remain in the main  waiting area with an adult.  If the patient needs to stay at the hospital during part of their recovery, the visitor guidelines for inpatient rooms apply.  Please refer to the [facility] website for the visitor guidelines for any additional information.   If you received a COVID test during your pre-op visit  it is requested that you wear a mask when out in public, stay away from anyone that may not be feeling well and notify your surgeon if you develop symptoms. If you have been in contact with anyone that has tested positive in the last 10 days please notify you surgeon.      Pre-operative CHG Bathing Instructions   You can play a key role in reducing the risk of infection after surgery. Your skin needs to be as free of germs as possible. You can reduce the number of germs on your skin by washing with CHG (chlorhexidine gluconate) soap before surgery. CHG is an antiseptic soap that kills germs and continues to kill germs even after washing.   DO NOT use if you have an allergy to chlorhexidine/CHG or antibacterial soaps. If your skin becomes reddened or irritated, stop using the CHG and notify one of our RNs at [phone].              TAKE A SHOWER THE NIGHT BEFORE SURGERY AND THE DAY OF SURGERY    Please keep in mind the following:  DO NOT shave, including legs and underarms, 48 hours prior to surgery.   You may shave your face before/day of surgery.  Place clean sheets on your bed the night before surgery Use a clean washcloth (not used since being washed) for each shower. DO NOT sleep with pet's night before surgery.  CHG Shower Instructions:  Wash your face and private area with normal soap. If you choose to wash your hair, wash first with your normal shampoo.  After you use shampoo/soap, rinse your hair and body thoroughly to remove shampoo/soap residue.  Turn the water OFF and apply half the bottle of CHG soap to a CLEAN washcloth.  Apply CHG soap ONLY FROM YOUR NECK DOWN TO  YOUR TOES (washing for 3-5 minutes)  DO NOT use CHG soap on face, private areas, open wounds, or sores.  Pay special attention to the area where your surgery is being performed.  If you are having back surgery, having someone wash your back for you may be helpful. Wait 2 minutes after CHG soap is applied, then you may rinse off the CHG soap.  Pat dry with a clean towel  Put on clean pajamas    Additional instructions for the day of surgery: DO NOT APPLY any lotions, deodorants, cologne, or perfumes.   Do not wear jewelry or makeup Do not wear nail polish, gel polish, artificial nails, or any other type of covering on natural nails (fingers and toes) Do not bring valuables to the hospital. [facility] is not responsible for valuables/personal belongings. Put on clean/comfortable clothes.  Please brush your teeth.  Ask your nurse before applying any prescription medications to the skin.

## 2022-12-12 ENCOUNTER — Encounter (HOSPITAL_COMMUNITY)
Admission: RE | Admit: 2022-12-12 | Discharge: 2022-12-12 | Disposition: A | Discharge status: Home / Self Care | Payer: Medicare Other | Source: Ambulatory Visit | Attending: Surgery | Admitting: Surgery

## 2022-12-12 ENCOUNTER — Encounter (HOSPITAL_COMMUNITY): Payer: Self-pay

## 2022-12-12 ENCOUNTER — Other Ambulatory Visit: Payer: Self-pay

## 2022-12-12 ENCOUNTER — Ambulatory Visit: Payer: Medicare Other

## 2022-12-12 VITALS — BP 133/69 | HR 58 | Temp 97.9°F | Resp 16 | Ht 74.0 in | Wt 198.0 lb

## 2022-12-12 DIAGNOSIS — I739 Peripheral vascular disease, unspecified: Secondary | ICD-10-CM | POA: Insufficient documentation

## 2022-12-12 DIAGNOSIS — I10 Essential (primary) hypertension: Secondary | ICD-10-CM | POA: Insufficient documentation

## 2022-12-12 DIAGNOSIS — I6521 Occlusion and stenosis of right carotid artery: Secondary | ICD-10-CM | POA: Insufficient documentation

## 2022-12-12 DIAGNOSIS — Z7901 Long term (current) use of anticoagulants: Secondary | ICD-10-CM | POA: Insufficient documentation

## 2022-12-12 DIAGNOSIS — Z01812 Encounter for preprocedural laboratory examination: Secondary | ICD-10-CM | POA: Insufficient documentation

## 2022-12-12 DIAGNOSIS — Z89422 Acquired absence of other left toe(s): Secondary | ICD-10-CM | POA: Insufficient documentation

## 2022-12-12 DIAGNOSIS — I2510 Atherosclerotic heart disease of native coronary artery without angina pectoris: Secondary | ICD-10-CM | POA: Insufficient documentation

## 2022-12-12 DIAGNOSIS — I252 Old myocardial infarction: Secondary | ICD-10-CM | POA: Insufficient documentation

## 2022-12-12 DIAGNOSIS — E785 Hyperlipidemia, unspecified: Secondary | ICD-10-CM | POA: Insufficient documentation

## 2022-12-12 DIAGNOSIS — Z01818 Encounter for other preprocedural examination: Secondary | ICD-10-CM

## 2022-12-12 HISTORY — DX: Malignant (primary) neoplasm, unspecified: C80.1

## 2022-12-12 LAB — URINALYSIS, ROUTINE W REFLEX MICROSCOPIC
Bilirubin Urine: NEGATIVE
Glucose, UA: NEGATIVE mg/dL
Hgb urine dipstick: NEGATIVE
Ketones, ur: NEGATIVE mg/dL
Leukocytes,Ua: NEGATIVE
Nitrite: NEGATIVE
Protein, ur: NEGATIVE mg/dL
Specific Gravity, Urine: 1.016 (ref 1.005–1.030)
pH: 5 (ref 5.0–8.0)

## 2022-12-12 LAB — COMPREHENSIVE METABOLIC PANEL
ALT: 26 U/L (ref 0–44)
AST: 24 U/L (ref 15–41)
Albumin: 3.7 g/dL (ref 3.5–5.0)
Alkaline Phosphatase: 70 U/L (ref 38–126)
Anion gap: 9 (ref 5–15)
BUN: 11 mg/dL (ref 8–23)
CO2: 21 mmol/L — ABNORMAL LOW (ref 22–32)
Calcium: 9.6 mg/dL (ref 8.9–10.3)
Chloride: 111 mmol/L (ref 98–111)
Creatinine, Ser: 1.19 mg/dL (ref 0.61–1.24)
GFR, Estimated: >60 mL/min (ref >60)
Glucose, Bld: 119 mg/dL — ABNORMAL HIGH (ref 70–99)
Potassium: 4.1 mmol/L (ref 3.5–5.1)
Sodium: 141 mmol/L (ref 135–145)
Total Bilirubin: 0.9 mg/dL (ref 0.3–1.2)
Total Protein: 7.3 g/dL (ref 6.5–8.1)

## 2022-12-12 LAB — PROTIME-INR
INR: 1.9 — ABNORMAL HIGH (ref 0.8–1.2)
Prothrombin Time: 21.8 s — ABNORMAL HIGH (ref 11.4–15.2)

## 2022-12-12 LAB — CBC
HCT: 46.1 % (ref 39.0–52.0)
Hemoglobin: 15 g/dL (ref 13.0–17.0)
MCH: 29.9 pg (ref 26.0–34.0)
MCHC: 32.5 g/dL (ref 30.0–36.0)
MCV: 92 fL (ref 80.0–100.0)
Platelets: 280 10*3/uL (ref 150–400)
RBC: 5.01 MIL/uL (ref 4.22–5.81)
RDW: 14.5 % (ref 11.5–15.5)
WBC: 8.2 10*3/uL (ref 4.0–10.5)
nRBC: 0 % (ref 0.0–0.2)

## 2022-12-12 LAB — TYPE AND SCREEN
ABO/RH(D): O NEG
Antibody Screen: NEGATIVE

## 2022-12-12 LAB — SURGICAL PCR SCREEN
MRSA, PCR: NEGATIVE
Staphylococcus aureus: NEGATIVE

## 2022-12-12 LAB — APTT: aPTT: 37 s — ABNORMAL HIGH (ref 24–36)

## 2022-12-12 NOTE — Progress Notes (Signed)
PCP - [name], FNP Cardiologist - Dr. Tonny Bollman  PPM/ICD - denies  Chest x-ray - 02/10/21 EKG - 03/28/22 Stress Test - "Several years ago" per pt (w/ Dr. Excell Seltzer per pt) ECHO - 10/26/13 Cardiac Cath - 09/21/05  Sleep Study - denies   DM- denies  Blood Thinner Instructions: Last dose Coumadin 10/28 Aspirin Instructions: continue on DOS  ERAS Protcol - no, NPO   COVID TEST- n/a   Anesthesia review: yes, cardiac hx  Patient denies shortness of breath, fever, cough and chest pain at PAT appointment   All instructions explained to the patient, with a verbal understanding of the material. Patient agrees to go over the instructions while at home for a better understanding.  The opportunity to ask questions was provided.

## 2022-12-13 NOTE — Progress Notes (Signed)
Anesthesia Chart Review:  Follows with cardiology for history of CAD s/p inferior STEMI 2007 treated with BMS to RCA (no significant disease in the left circumflex, LAD), HTN, HLD.  Last echo in 2015 showed EF 55 to 60%, grade 1 DD, no significant valvular abnormalities.  Last seen by Tereso Newcomer, PA-C on 03/28/2022.  Doing well at that time, no anginal symptoms, continued on current medical therapy and recommended to follow-up in 1 year.  Follows with vascular surgery for extensive history of PAD.  He is s/p EVAR 2011 with known type II endoleak, popliteal artery aneurysm repair 2011, amputation of left fourth and fifth toes 2015, left femoropopliteal bypass 2015, angioplasty of graft in 2016 2019, and 2024 with drug-coated balloon.  He is maintained on Coumadin.  Last seen by Dr. Myra Gianotti on 12/03/2022.  At that time he was noted to have asymptomatic high-grade stenosis of his right ICA.  CT showed string sign.  Endarterectomy recommended.  Patient reports last dose Coumadin 12/10/2022.  Preop labs reviewed, unremarkable.  EKG 03/28/2022: NSR with sinus arrhythmia.  Rate 75.  Left axis deviation.  Inferior infarct, age undetermined.  TTE 10/26/2013: - Left ventricle: The cavity size was normal. Wall thickness was    increased in a pattern of moderate LVH. Systolic function was    normal. The estimated ejection fraction was in the range of 55%    to 60%. Wall motion was normal; there were no regional wall    motion abnormalities. Doppler parameters are consistent with    abnormal left ventricular relaxation (grade 1 diastolic    dysfunction). The E/e&' ratio is between 8-15, suggesting    indeterminate LV filling pressure.  - Left atrium: The atrium was normal in size.   Impressions:   - LVEF 55-60%, normal wall motion, moderate LVH, normal LA size,    diastolic dysfunction, indeterminate LV filling pressure.      Zannie Cove [facility] Short Stay Center/Anesthesiology Phone [phone] 12/13/2022 10:27 AM

## 2022-12-13 NOTE — Anesthesia Preprocedure Evaluation (Addendum)
Anesthesia Evaluation  Patient identified by MRN, date of birth, ID band Patient awake    Reviewed: Allergy & Precautions, H&P , NPO status , Patient's Chart, lab work & pertinent test results  Airway Mallampati: II  TM Distance: >3 FB Neck ROM: Full    Dental no notable dental hx.    Pulmonary neg pulmonary ROS, former smoker   Pulmonary exam normal breath sounds clear to auscultation       Cardiovascular hypertension, + CAD, + Past MI, + Cardiac Stents and + Peripheral Vascular Disease  Normal cardiovascular exam Rhythm:Regular Rate:Normal     Neuro/Psych negative neurological ROS  negative psych ROS   GI/Hepatic negative GI ROS, Neg liver ROS,,,  Endo/Other  negative endocrine ROS    Renal/GU negative Renal ROS  negative genitourinary   Musculoskeletal negative musculoskeletal ROS (+)    Abdominal   Peds negative pediatric ROS (+)  Hematology negative hematology ROS (+)   Anesthesia Other Findings   Reproductive/Obstetrics negative OB ROS                             Anesthesia Physical Anesthesia Plan  ASA: 3  Anesthesia Plan: General   Post-op Pain Management: Ofirmev IV (intra-op)*   Induction: Intravenous  PONV Risk Score and Plan: 2 and Ondansetron, Dexamethasone and Treatment may vary due to age or medical condition  Airway Management Planned: Oral ETT  Additional Equipment: Arterial line  Intra-op Plan:   Post-operative Plan: Extubation in OR  Informed Consent: I have reviewed the patients History and Physical, chart, labs and discussed the procedure including the risks, benefits and alternatives for the proposed anesthesia with the patient or authorized representative who has indicated his/her understanding and acceptance.     Dental advisory given  Plan Discussed with: CRNA and Surgeon  Anesthesia Plan Comments: (AT note by Antionette Poles, PA-C: Follows with  cardiology for history of CAD s/p inferior STEMI 2007 treated with BMS to RCA (no significant disease in the left circumflex, LAD), HTN, HLD.  Last echo in 2015 showed EF 55 to 60%, grade 1 DD, no significant valvular abnormalities.  Last seen by Tereso Newcomer, PA-C on 03/28/2022.  Doing well at that time, no anginal symptoms, continued on current medical therapy and recommended to follow-up in 1 year.  Follows with vascular surgery for extensive history of PAD.  He is s/p EVAR 2011 with known type II endoleak, popliteal artery aneurysm repair 2011, amputation of left fourth and fifth toes 2015, left femoropopliteal bypass 2015, angioplasty of graft in 2016 2019, and 2024 with drug-coated balloon.  He is maintained on Coumadin.  Last seen by Dr. Myra Gianotti on 12/03/2022.  At that time he was noted to have asymptomatic high-grade stenosis of his right ICA.  CT showed string sign.  Endarterectomy recommended.  Patient reports last dose Coumadin 12/10/2022.  Preop labs reviewed, unremarkable.  EKG 03/28/2022: NSR with sinus arrhythmia.  Rate 75.  Left axis deviation.  Inferior infarct, age undetermined.  TTE 10/26/2013: - Left ventricle: The cavity size was normal. Wall thickness was  increased in a pattern of moderate LVH. Systolic function was  normal. The estimated ejection fraction was in the range of 55%  to 60%. Wall motion was normal; there were no regional wall  motion abnormalities. Doppler parameters are consistent with  abnormal left ventricular relaxation (grade 1 diastolic  dysfunction). The E/e&' ratio is between 8-15, suggesting  indeterminate LV filling pressure.  -  Left atrium: The atrium was normal in size.   Impressions:   - LVEF 55-60%, normal wall motion, moderate LVH, normal LA size,  diastolic dysfunction, indeterminate LV filling pressure.    )        Anesthesia Quick Evaluation

## 2022-12-14 ENCOUNTER — Encounter (HOSPITAL_COMMUNITY)
Admission: RE | Disposition: A | Discharge status: Home / Self Care | Payer: Self-pay | Source: Home / Self Care | Attending: Surgery

## 2022-12-14 ENCOUNTER — Inpatient Hospital Stay (HOSPITAL_COMMUNITY)
Admission: RE | Admit: 2022-12-14 | Discharge: 2022-12-15 | DRG: 039 | Disposition: A | Discharge status: Home / Self Care | Payer: Medicare Other | Attending: Surgery | Admitting: Surgery

## 2022-12-14 ENCOUNTER — Other Ambulatory Visit: Payer: Self-pay

## 2022-12-14 ENCOUNTER — Encounter (HOSPITAL_COMMUNITY): Payer: Self-pay | Admitting: Surgery

## 2022-12-14 ENCOUNTER — Inpatient Hospital Stay (HOSPITAL_COMMUNITY): Payer: Medicare Other | Admitting: Physician Assistant

## 2022-12-14 ENCOUNTER — Inpatient Hospital Stay (HOSPITAL_COMMUNITY): Payer: Medicare Other

## 2022-12-14 DIAGNOSIS — I6521 Occlusion and stenosis of right carotid artery: Secondary | ICD-10-CM

## 2022-12-14 DIAGNOSIS — F17220 Nicotine dependence, chewing tobacco, uncomplicated: Secondary | ICD-10-CM | POA: Diagnosis present

## 2022-12-14 DIAGNOSIS — E785 Hyperlipidemia, unspecified: Secondary | ICD-10-CM | POA: Diagnosis present

## 2022-12-14 DIAGNOSIS — I714 Abdominal aortic aneurysm, without rupture, unspecified: Secondary | ICD-10-CM | POA: Diagnosis present

## 2022-12-14 DIAGNOSIS — I252 Old myocardial infarction: Secondary | ICD-10-CM | POA: Diagnosis not present

## 2022-12-14 DIAGNOSIS — Z8249 Family history of ischemic heart disease and other diseases of the circulatory system: Secondary | ICD-10-CM

## 2022-12-14 DIAGNOSIS — Z7901 Long term (current) use of anticoagulants: Secondary | ICD-10-CM

## 2022-12-14 DIAGNOSIS — Z955 Presence of coronary angioplasty implant and graft: Secondary | ICD-10-CM | POA: Diagnosis not present

## 2022-12-14 DIAGNOSIS — Z87442 Personal history of urinary calculi: Secondary | ICD-10-CM | POA: Diagnosis not present

## 2022-12-14 DIAGNOSIS — I10 Essential (primary) hypertension: Secondary | ICD-10-CM | POA: Diagnosis present

## 2022-12-14 DIAGNOSIS — I7143 Infrarenal abdominal aortic aneurysm, without rupture: Secondary | ICD-10-CM

## 2022-12-14 DIAGNOSIS — Z9889 Other specified postprocedural states: Principal | ICD-10-CM

## 2022-12-14 DIAGNOSIS — Z888 Allergy status to other drugs, medicaments and biological substances status: Secondary | ICD-10-CM | POA: Diagnosis not present

## 2022-12-14 DIAGNOSIS — Z91041 Radiographic dye allergy status: Secondary | ICD-10-CM

## 2022-12-14 DIAGNOSIS — Z01818 Encounter for other preprocedural examination: Secondary | ICD-10-CM

## 2022-12-14 DIAGNOSIS — Z7982 Long term (current) use of aspirin: Secondary | ICD-10-CM | POA: Diagnosis not present

## 2022-12-14 DIAGNOSIS — Z881 Allergy status to other antibiotic agents status: Secondary | ICD-10-CM

## 2022-12-14 DIAGNOSIS — I739 Peripheral vascular disease, unspecified: Secondary | ICD-10-CM

## 2022-12-14 DIAGNOSIS — Z79899 Other long term (current) drug therapy: Secondary | ICD-10-CM | POA: Diagnosis not present

## 2022-12-14 DIAGNOSIS — I2510 Atherosclerotic heart disease of native coronary artery without angina pectoris: Secondary | ICD-10-CM | POA: Diagnosis present

## 2022-12-14 HISTORY — PX: ENDARTERECTOMY: SHX5162

## 2022-12-14 HISTORY — PX: PATCH ANGIOPLASTY: SHX6230

## 2022-12-14 HISTORY — PX: CAROTID ENDARTERECTOMY: SUR193

## 2022-12-14 LAB — APTT: aPTT: 31 s (ref 24–36)

## 2022-12-14 LAB — POCT ACTIVATED CLOTTING TIME
Activated Clotting Time: 275 s
Activated Clotting Time: 159 s

## 2022-12-14 LAB — PROTIME-INR
INR: 1.2 (ref 0.8–1.2)
Prothrombin Time: 15.8 s — ABNORMAL HIGH (ref 11.4–15.2)

## 2022-12-14 SURGERY — ENDARTERECTOMY, CAROTID
Anesthesia: General | Site: Neck | Laterality: Right

## 2022-12-14 MED ORDER — ACETAMINOPHEN 325 MG RE SUPP: 325.0000 mg | RECTAL | Status: DC | PRN

## 2022-12-14 MED ORDER — ALBUMIN HUMAN 5 % IV SOLN
INTRAVENOUS | Status: AC
  Filled 2022-12-14: qty 250

## 2022-12-14 MED ORDER — PROPOFOL 10 MG/ML IV BOLUS
INTRAVENOUS | Status: DC | PRN
  Administered 2022-12-14: 150 mg via INTRAVENOUS
  Administered 2022-12-14: 50 mg via INTRAVENOUS

## 2022-12-14 MED ORDER — GLYCOPYRROLATE PF 0.2 MG/ML IJ SOSY
PREFILLED_SYRINGE | INTRAMUSCULAR | Status: AC
  Filled 2022-12-14: qty 1

## 2022-12-14 MED ORDER — CEFAZOLIN SODIUM-DEXTROSE 2-4 GM/100ML-% IV SOLN
2.0000 g | INTRAVENOUS | Status: AC
  Administered 2022-12-14: 2 g via INTRAVENOUS
  Filled 2022-12-14: qty 100

## 2022-12-14 MED ORDER — HEPARIN 6000 UNIT IRRIGATION SOLUTION
Status: DC | PRN
  Administered 2022-12-14: 1

## 2022-12-14 MED ORDER — MAGNESIUM SULFATE 2 GM/50ML IV SOLN
2.0000 g | Freq: Every day | INTRAVENOUS | Status: DC | PRN
Start: 2022-12-14 — End: 2022-12-15

## 2022-12-14 MED ORDER — ONDANSETRON HCL 4 MG/2ML IJ SOLN
INTRAMUSCULAR | Status: AC
  Filled 2022-12-14: qty 2

## 2022-12-14 MED ORDER — LIDOCAINE 2% (20 MG/ML) 5 ML SYRINGE
INTRAMUSCULAR | Status: DC | PRN
  Administered 2022-12-14: 60 mg via INTRAVENOUS

## 2022-12-14 MED ORDER — ACETAMINOPHEN 10 MG/ML IV SOLN
INTRAVENOUS | Status: DC | PRN
  Administered 2022-12-14: 1000 mg via INTRAVENOUS

## 2022-12-14 MED ORDER — PANTOPRAZOLE SODIUM 40 MG PO TBEC
40.0000 mg | DELAYED_RELEASE_TABLET | Freq: Every day | ORAL | Status: DC
  Administered 2022-12-15: 40 mg via ORAL
  Filled 2022-12-14: qty 1

## 2022-12-14 MED ORDER — SODIUM CHLORIDE 0.9 % IV SOLN: INTRAVENOUS | Status: DC

## 2022-12-14 MED ORDER — METOPROLOL TARTRATE 5 MG/5ML IV SOLN: 2.0000 mg | INTRAVENOUS | Status: DC | PRN

## 2022-12-14 MED ORDER — HYDROMORPHONE HCL 1 MG/ML IJ SOLN: 0.2500 mg | INTRAMUSCULAR | Status: DC | PRN

## 2022-12-14 MED ORDER — HEPARIN SODIUM (PORCINE) 1000 UNIT/ML IJ SOLN
INTRAMUSCULAR | Status: AC
  Filled 2022-12-14: qty 10

## 2022-12-14 MED ORDER — CHLORHEXIDINE GLUCONATE 0.12 % MT SOLN
OROMUCOSAL | Status: AC
  Administered 2022-12-14: 15 mL
  Filled 2022-12-14: qty 15

## 2022-12-14 MED ORDER — FENTANYL CITRATE (PF) 250 MCG/5ML IJ SOLN
INTRAMUSCULAR | Status: DC | PRN
  Administered 2022-12-14: 50 ug via INTRAVENOUS
  Administered 2022-12-14: 100 ug via INTRAVENOUS

## 2022-12-14 MED ORDER — PHENOL 1.4 % MT LIQD: 1.0000 | OROMUCOSAL | Status: DC | PRN

## 2022-12-14 MED ORDER — POTASSIUM CHLORIDE CRYS ER 20 MEQ PO TBCR: 20.0000 meq | EXTENDED_RELEASE_TABLET | Freq: Every day | ORAL | Status: DC | PRN

## 2022-12-14 MED ORDER — SODIUM CHLORIDE 0.9 % IV SOLN: 500.0000 mL | Freq: Once | INTRAVENOUS | Status: DC | PRN

## 2022-12-14 MED ORDER — SUGAMMADEX SODIUM 200 MG/2ML IV SOLN
INTRAVENOUS | Status: DC | PRN
  Administered 2022-12-14: 200 mg via INTRAVENOUS

## 2022-12-14 MED ORDER — ACETAMINOPHEN 325 MG PO TABS
325.0000 mg | ORAL_TABLET | ORAL | Status: DC | PRN
Start: 2022-12-14 — End: 2022-12-15
  Administered 2022-12-15: 650 mg via ORAL
  Filled 2022-12-14: qty 2

## 2022-12-14 MED ORDER — WARFARIN - PHYSICIAN DOSING INPATIENT: Freq: Every day | Status: DC

## 2022-12-14 MED ORDER — DEXAMETHASONE SODIUM PHOSPHATE 10 MG/ML IJ SOLN
INTRAMUSCULAR | Status: DC | PRN
  Administered 2022-12-14: 10 mg via INTRAVENOUS

## 2022-12-14 MED ORDER — DEXAMETHASONE SODIUM PHOSPHATE 10 MG/ML IJ SOLN
INTRAMUSCULAR | Status: AC
  Filled 2022-12-14: qty 1

## 2022-12-14 MED ORDER — ASPIRIN 81 MG PO TBEC
81.0000 mg | DELAYED_RELEASE_TABLET | Freq: Every day | ORAL | Status: DC
  Administered 2022-12-14 – 2022-12-15 (×2): 81 mg via ORAL
  Filled 2022-12-14 (×3): qty 1

## 2022-12-14 MED ORDER — 0.9 % SODIUM CHLORIDE (POUR BTL) OPTIME
TOPICAL | Status: DC | PRN
  Administered 2022-12-14: 2000 mL

## 2022-12-14 MED ORDER — MORPHINE SULFATE (PF) 2 MG/ML IV SOLN: 2.0000 mg | INTRAVENOUS | Status: DC | PRN

## 2022-12-14 MED ORDER — ACETAMINOPHEN 10 MG/ML IV SOLN
INTRAVENOUS | Status: AC
  Filled 2022-12-14: qty 100

## 2022-12-14 MED ORDER — ATORVASTATIN CALCIUM 40 MG PO TABS
40.0000 mg | ORAL_TABLET | Freq: Every day | ORAL | Status: DC
  Administered 2022-12-14 – 2022-12-15 (×2): 40 mg via ORAL
  Filled 2022-12-14 (×3): qty 1

## 2022-12-14 MED ORDER — LIDOCAINE HCL 1 % IJ SOLN
INTRAMUSCULAR | Status: AC
  Filled 2022-12-14: qty 20

## 2022-12-14 MED ORDER — PHENYLEPHRINE 80 MCG/ML (10ML) SYRINGE FOR IV PUSH (FOR BLOOD PRESSURE SUPPORT)
PREFILLED_SYRINGE | INTRAVENOUS | Status: DC | PRN
  Administered 2022-12-14 (×5): 160 ug via INTRAVENOUS
  Administered 2022-12-14: 80 ug via INTRAVENOUS

## 2022-12-14 MED ORDER — SODIUM CHLORIDE 0.9 % IV SOLN: INTRAVENOUS | Status: AC

## 2022-12-14 MED ORDER — PHENYLEPHRINE HCL-NACL 20-0.9 MG/250ML-% IV SOLN
INTRAVENOUS | Status: AC
  Filled 2022-12-14: qty 500

## 2022-12-14 MED ORDER — HYDROMORPHONE HCL 1 MG/ML IJ SOLN
0.2500 mg | INTRAMUSCULAR | Status: DC | PRN
Start: 2022-12-14 — End: 2022-12-14

## 2022-12-14 MED ORDER — WARFARIN SODIUM 7.5 MG PO TABS
7.5000 mg | ORAL_TABLET | Freq: Every day | ORAL | Status: DC
  Administered 2022-12-14: 7.5 mg via ORAL
  Filled 2022-12-14: qty 1
  Filled 2022-12-14: qty 3
  Filled 2022-12-14: qty 1

## 2022-12-14 MED ORDER — SODIUM CHLORIDE 0.9 % IV SOLN
25.0000 ug/min | INTRAVENOUS | Status: DC
  Administered 2022-12-14: 25 ug/min via INTRAVENOUS
  Filled 2022-12-14: qty 2

## 2022-12-14 MED ORDER — BISACODYL 10 MG RE SUPP: 10.0000 mg | Freq: Every day | RECTAL | Status: DC | PRN

## 2022-12-14 MED ORDER — ONDANSETRON HCL 4 MG/2ML IJ SOLN: 4.0000 mg | Freq: Once | INTRAMUSCULAR | Status: DC | PRN

## 2022-12-14 MED ORDER — SODIUM CHLORIDE 0.9 % IV SOLN: 250.0000 mL | INTRAVENOUS | Status: DC

## 2022-12-14 MED ORDER — MIDAZOLAM HCL 2 MG/2ML IJ SOLN
INTRAMUSCULAR | Status: DC | PRN
  Administered 2022-12-14: 2 mg via INTRAVENOUS

## 2022-12-14 MED ORDER — POLYETHYLENE GLYCOL 3350 17 G PO PACK: 17.0000 g | PACK | Freq: Every day | ORAL | Status: DC | PRN

## 2022-12-14 MED ORDER — CLEVIDIPINE BUTYRATE 0.5 MG/ML IV EMUL
1.0000 mg/h | INTRAVENOUS | Status: DC
  Filled 2022-12-14 (×2): qty 50

## 2022-12-14 MED ORDER — CHLORHEXIDINE GLUCONATE CLOTH 2 % EX PADS: 6.0000 | MEDICATED_PAD | Freq: Once | CUTANEOUS | Status: DC

## 2022-12-14 MED ORDER — GLYCOPYRROLATE 0.2 MG/ML IJ SOLN
INTRAMUSCULAR | Status: DC | PRN
  Administered 2022-12-14: .2 mg via INTRAVENOUS

## 2022-12-14 MED ORDER — ALBUMIN HUMAN 5 % IV SOLN
12.5000 g | Freq: Once | INTRAVENOUS | Status: AC
  Administered 2022-12-14: 12.5 g via INTRAVENOUS

## 2022-12-14 MED ORDER — DOCUSATE SODIUM 100 MG PO CAPS
100.0000 mg | ORAL_CAPSULE | Freq: Every day | ORAL | Status: DC
  Administered 2022-12-15: 100 mg via ORAL
  Filled 2022-12-14: qty 1

## 2022-12-14 MED ORDER — LIDOCAINE 2% (20 MG/ML) 5 ML SYRINGE
INTRAMUSCULAR | Status: AC
  Filled 2022-12-14: qty 5

## 2022-12-14 MED ORDER — CEFAZOLIN SODIUM-DEXTROSE 2-4 GM/100ML-% IV SOLN
2.0000 g | Freq: Three times a day (TID) | INTRAVENOUS | Status: AC
  Administered 2022-12-14: 2 g via INTRAVENOUS
  Filled 2022-12-14: qty 100

## 2022-12-14 MED ORDER — MIDAZOLAM HCL 2 MG/2ML IJ SOLN
INTRAMUSCULAR | Status: AC
  Filled 2022-12-14: qty 2

## 2022-12-14 MED ORDER — METOPROLOL SUCCINATE ER 25 MG PO TB24
25.0000 mg | ORAL_TABLET | Freq: Every day | ORAL | Status: DC
  Filled 2022-12-14: qty 1

## 2022-12-14 MED ORDER — ROCURONIUM BROMIDE 10 MG/ML (PF) SYRINGE
PREFILLED_SYRINGE | INTRAVENOUS | Status: AC
  Filled 2022-12-14: qty 10

## 2022-12-14 MED ORDER — LABETALOL HCL 5 MG/ML IV SOLN
10.0000 mg | INTRAVENOUS | Status: DC | PRN
Start: 2022-12-14 — End: 2022-12-15

## 2022-12-14 MED ORDER — LACTATED RINGERS IV SOLN: INTRAVENOUS | Status: DC | PRN

## 2022-12-14 MED ORDER — OXYCODONE HCL 5 MG PO TABS: 5.0000 mg | ORAL_TABLET | Freq: Once | ORAL | Status: DC | PRN

## 2022-12-14 MED ORDER — EPHEDRINE SULFATE-NACL 50-0.9 MG/10ML-% IV SOSY
PREFILLED_SYRINGE | INTRAVENOUS | Status: DC | PRN
  Administered 2022-12-14 (×5): 5 mg via INTRAVENOUS

## 2022-12-14 MED ORDER — OXYCODONE HCL 5 MG/5ML PO SOLN: 5.0000 mg | Freq: Once | ORAL | Status: DC | PRN

## 2022-12-14 MED ORDER — PROTAMINE SULFATE 10 MG/ML IV SOLN
INTRAVENOUS | Status: DC | PRN
  Administered 2022-12-14: 50 mg via INTRAVENOUS

## 2022-12-14 MED ORDER — OXYCODONE-ACETAMINOPHEN 5-325 MG PO TABS
1.0000 | ORAL_TABLET | ORAL | Status: DC | PRN
Start: 2022-12-14 — End: 2022-12-15

## 2022-12-14 MED ORDER — ROCURONIUM BROMIDE 10 MG/ML (PF) SYRINGE
PREFILLED_SYRINGE | INTRAVENOUS | Status: DC | PRN
  Administered 2022-12-14: 50 mg via INTRAVENOUS
  Administered 2022-12-14: 30 mg via INTRAVENOUS

## 2022-12-14 MED ORDER — HYDRALAZINE HCL 20 MG/ML IJ SOLN
5.0000 mg | INTRAMUSCULAR | Status: DC | PRN
Start: 2022-12-14 — End: 2022-12-15

## 2022-12-14 MED ORDER — SAW PALMETTO 450 MG PO CAPS: 900.0000 mg | ORAL_CAPSULE | Freq: Two times a day (BID) | ORAL | Status: DC

## 2022-12-14 MED ORDER — PROPOFOL 10 MG/ML IV BOLUS
INTRAVENOUS | Status: AC
  Filled 2022-12-14: qty 20

## 2022-12-14 MED ORDER — HEPARIN 6000 UNIT IRRIGATION SOLUTION
Status: AC
  Filled 2022-12-14: qty 500

## 2022-12-14 MED ORDER — FENOFIBRATE 54 MG PO TABS
54.0000 mg | ORAL_TABLET | Freq: Every day | ORAL | Status: DC
  Administered 2022-12-14 – 2022-12-15 (×2): 54 mg via ORAL
  Filled 2022-12-14 (×2): qty 1

## 2022-12-14 MED ORDER — PHENYLEPHRINE HCL-NACL 20-0.9 MG/250ML-% IV SOLN
INTRAVENOUS | Status: DC | PRN
  Administered 2022-12-14: 60 ug/min via INTRAVENOUS

## 2022-12-14 MED ORDER — SODIUM CHLORIDE 0.9 % IV SOLN
0.1500 ug/kg/min | INTRAVENOUS | Status: AC
  Administered 2022-12-14: .05 ug/kg/min via INTRAVENOUS
  Filled 2022-12-14: qty 1000

## 2022-12-14 MED ORDER — HEMOSTATIC AGENTS (NO CHARGE) OPTIME
TOPICAL | Status: DC | PRN
  Administered 2022-12-14: 1 via TOPICAL

## 2022-12-14 MED ORDER — HEPARIN SODIUM (PORCINE) 1000 UNIT/ML IJ SOLN
INTRAMUSCULAR | Status: DC | PRN
  Administered 2022-12-14: 9000 [IU] via INTRAVENOUS

## 2022-12-14 MED ORDER — ALBUMIN HUMAN 5 % IV SOLN: INTRAVENOUS | Status: DC | PRN

## 2022-12-14 MED ORDER — ONDANSETRON HCL 4 MG/2ML IJ SOLN
INTRAMUSCULAR | Status: DC | PRN
  Administered 2022-12-14: 4 mg via INTRAVENOUS

## 2022-12-14 MED ORDER — ONDANSETRON HCL 4 MG/2ML IJ SOLN: 4.0000 mg | Freq: Four times a day (QID) | INTRAMUSCULAR | Status: DC | PRN

## 2022-12-14 MED ORDER — PHENYLEPHRINE 80 MCG/ML (10ML) SYRINGE FOR IV PUSH (FOR BLOOD PRESSURE SUPPORT)
PREFILLED_SYRINGE | INTRAVENOUS | Status: AC
  Filled 2022-12-14: qty 10

## 2022-12-14 MED ORDER — ADULT MULTIVITAMIN W/MINERALS CH
1.0000 | ORAL_TABLET | Freq: Every day | ORAL | Status: DC
  Administered 2022-12-15: 1 via ORAL
  Filled 2022-12-14: qty 1

## 2022-12-14 MED ORDER — PROPOFOL 1000 MG/100ML IV EMUL
INTRAVENOUS | Status: AC
Start: 2022-12-14 — End: ?
  Filled 2022-12-14: qty 200

## 2022-12-14 MED ORDER — ALUM & MAG HYDROXIDE-SIMETH 200-200-20 MG/5ML PO SUSP: 15.0000 mL | ORAL | Status: DC | PRN

## 2022-12-14 MED ORDER — GUAIFENESIN-DM 100-10 MG/5ML PO SYRP: 15.0000 mL | ORAL_SOLUTION | ORAL | Status: DC | PRN

## 2022-12-14 MED ORDER — FENTANYL CITRATE (PF) 250 MCG/5ML IJ SOLN
INTRAMUSCULAR | Status: AC
  Filled 2022-12-14: qty 5

## 2022-12-14 SURGICAL SUPPLY — 49 items
ADH SKN CLS APL DERMABOND .7 (GAUZE/BANDAGES/DRESSINGS) ×1
AGENT HMST KT MTR STRL THRMB (HEMOSTASIS) ×1
BAG COUNTER SPONGE SURGICOUNT (BAG) ×1 IMPLANT
BAG SPNG CNTER NS LX DISP (BAG) ×1
CANISTER SUCT 3000ML PPV (MISCELLANEOUS) ×1 IMPLANT
CATH ROBINSON RED A/P 18FR (CATHETERS) ×1 IMPLANT
CATH SUCT 10FR WHISTLE TIP (CATHETERS) ×1 IMPLANT
CATH SUCT ARGYLE CHIMNEY 10FR (CATHETERS) IMPLANT
CLIP TI MEDIUM 6 (CLIP) ×1 IMPLANT
CLIP TI WIDE RED SMALL 6 (CLIP) ×1 IMPLANT
COVER PROBE W GEL 5X96 (DRAPES) IMPLANT
DERMABOND ADVANCED .7 DNX12 (GAUZE/BANDAGES/DRESSINGS) ×1 IMPLANT
DRAIN CHANNEL 15F RND FF W/TCR (WOUND CARE) IMPLANT
DRAPE INCISE IOBAN 66X45 STRL (DRAPES) IMPLANT
ELECT REM PT RETURN 9FT ADLT (ELECTROSURGICAL) ×1
ELECTRODE REM PT RTRN 9FT ADLT (ELECTROSURGICAL) ×1 IMPLANT
EVACUATOR SILICONE 100CC (DRAIN) IMPLANT
GLOVE SURG SS PI 7.5 STRL IVOR (GLOVE) ×3 IMPLANT
GOWN STRL REUS W/ TWL LRG LVL3 (GOWN DISPOSABLE) ×2 IMPLANT
GOWN STRL REUS W/ TWL XL LVL3 (GOWN DISPOSABLE) ×1 IMPLANT
GOWN STRL REUS W/TWL LRG LVL3 (GOWN DISPOSABLE) ×2
GOWN STRL REUS W/TWL XL LVL3 (GOWN DISPOSABLE) ×1
HEMOSTAT SNOW SURGICEL 2X4 (HEMOSTASIS) IMPLANT
INSERT FOGARTY SM (MISCELLANEOUS) IMPLANT
KIT BASIN OR (CUSTOM PROCEDURE TRAY) ×1 IMPLANT
KIT SHUNT ARGYLE CAROTID ART 6 (VASCULAR PRODUCTS) IMPLANT
KIT TURNOVER KIT B (KITS) ×1 IMPLANT
NDL HYPO 25GX1X1/2 BEV (NEEDLE) IMPLANT
NEEDLE HYPO 25GX1X1/2 BEV (NEEDLE) IMPLANT
NS IRRIG 1000ML POUR BTL (IV SOLUTION) ×3 IMPLANT
PACK CAROTID (CUSTOM PROCEDURE TRAY) ×1 IMPLANT
PAD ARMBOARD 7.5X6 YLW CONV (MISCELLANEOUS) ×2 IMPLANT
PATCH VASC XENOSURE 1X6 (Vascular Products) IMPLANT
POSITIONER HEAD DONUT 9IN (MISCELLANEOUS) ×1 IMPLANT
SET WALTER ACTIVATION W/DRAPE (SET/KITS/TRAYS/PACK) IMPLANT
SHUNT CAROTID BYPASS 10 (VASCULAR PRODUCTS) IMPLANT
SHUNT CAROTID BYPASS 12FRX15.5 (VASCULAR PRODUCTS) IMPLANT
SURGIFLO W/THROMBIN 8M KIT (HEMOSTASIS) IMPLANT
SUT ETHILON 3 0 PS 1 (SUTURE) IMPLANT
SUT PROLENE 6 0 BV (SUTURE) ×2 IMPLANT
SUT SILK 3 0 (SUTURE)
SUT SILK 3-0 18XBRD TIE 12 (SUTURE) IMPLANT
SUT VIC AB 3-0 SH 27 (SUTURE) ×2
SUT VIC AB 3-0 SH 27X BRD (SUTURE) ×2 IMPLANT
SUT VIC AB 3-0 X1 27 (SUTURE) ×1 IMPLANT
SYR CONTROL 10ML LL (SYRINGE) IMPLANT
TOWEL GREEN STERILE (TOWEL DISPOSABLE) ×1 IMPLANT
VASCULAR TIE MINI RED 18IN STL (MISCELLANEOUS) IMPLANT
WATER STERILE IRR 1000ML POUR (IV SOLUTION) ×1 IMPLANT

## 2022-12-14 NOTE — Anesthesia Procedure Notes (Signed)
Arterial Line Insertion Start/End11/02/2022 6:55 AM Performed by: Eilene Ghazi, MD, Noah Delaine, CRNA, CRNA  Patient location: Pre-op. Preanesthetic checklist: patient identified, IV checked, site marked, risks and benefits discussed, surgical consent, monitors and equipment checked, pre-op evaluation, timeout performed and anesthesia consent Lidocaine 1% used for infiltration radial was placed Catheter size: 20 G Hand hygiene performed , maximum sterile barriers used  and Seldinger technique used  Attempts: 1 Procedure performed without using ultrasound guided technique. Following insertion, dressing applied. Post procedure assessment: normal and unchanged

## 2022-12-14 NOTE — Progress Notes (Signed)
  Day of Surgery Note    Subjective:  resting in recovery   Vitals:   12/14/22 1200 12/14/22 1215  BP: (!) 120/57 (!) 112/59  Pulse: 60 64  Resp: 17 19  Temp:    SpO2: 94% 95%    Incisions:   right neck is clean without hematoma Neuro:  in tact; tongue is midline; moving all extremities Cardiac:  regular Lungs:  non labored    Assessment/Plan:  This is a 77 y.o. male who is s/p  Right carotid endarterectomy  -pt doing well with neuro in tact.  Neo has been weaned off and systolic pressure 120's.   -continue asa/statin.  Pt on coumadin prior to surgery.  Will restart his home dose tonight.  Will get INR in am. -anticipate discharge tomorrow if no issues overnight. -to 4 east later this afternoon.   Doreatha Massed, PA-C 12/14/2022 12:45 PM [phone]

## 2022-12-14 NOTE — Discharge Instructions (Signed)
   [facility]  Discharge Instructions   Carotid Surgery  Please refer to the following instructions for your post-procedure care. Your surgeon or physician assistant will discuss any changes with you.  Activity  You are encouraged to walk as much as you can. You can slowly return to normal activities but must avoid strenuous activity and heavy lifting until your doctor tell you it's okay. Avoid activities such as vacuuming or swinging a golf club. You can drive after one week if you are comfortable and you are no longer taking prescription pain medications. It is normal to feel tired for serval weeks after your surgery. It is also normal to have difficulty with sleep habits, eating, and bowel movements after surgery. These will go away with time.  Bathing/Showering  Shower daily after you go home. Do not soak in a bathtub, hot tub, or swim until the incision heals completely.  Incision Care  Shower every day. Clean your incision with mild soap and water. Pat the area dry with a clean towel. You do not need a bandage unless otherwise instructed. Do not apply any ointments or creams to your incision. You may have skin glue on your incision. Do not peel it off. It will come off on its own in about one week. Your incision may feel thickened and raised for several weeks after your surgery. This is normal and the skin will soften over time.   For Men Only: It's okay to shave around the incision but do not shave the incision itself for 2 weeks. It is common to have numbness under your chin that could last for several months.  Diet  Resume your normal diet. There are no special food restrictions following this procedure. A low fat/low cholesterol diet is recommended for all patients with vascular disease. In order to heal from your surgery, it is CRITICAL to get adequate nutrition. Your body requires vitamins, minerals, and protein. Vegetables are the best source of  vitamins and minerals. Vegetables also provide the perfect balance of protein. Processed food has little nutritional value, so try to avoid this.  Medications  Resume taking all of your medications unless your doctor or physician assistant tells you not to. If your incision is causing pain, you may take over-the- counter pain relievers such as acetaminophen (Tylenol). If you were prescribed a stronger pain medication, please be aware these medications can cause nausea and constipation. Prevent nausea by taking the medication with a snack or meal. Avoid constipation by drinking plenty of fluids and eating foods with a high amount of fiber, such as fruits, vegetables, and grains.   Do not take Tylenol if you are taking prescription pain medications.  Follow Up  Our office will schedule a follow up appointment 2-3 weeks following discharge.  Please call us immediately for any of the following conditions  . Increased pain, redness, drainage (pus) from your incision site. . Fever of 101 degrees or higher. . If you should develop stroke (slurred speech, difficulty swallowing, weakness on one side of your body, loss of vision) you should call 911 and go to the nearest emergency room. .  Reduce your risk of vascular disease:  . Stop smoking. If you would like help call [facility] at 1-800-QUIT-NOW (1-800-784-8669) or [facility] at 336-586-4000. . Manage your cholesterol . Maintain a desired weight . Control your diabetes . Keep your blood pressure down .  If you have any questions, please call the office at [phone]. 

## 2022-12-14 NOTE — Op Note (Signed)
Patient name: [name] [name] MRN: [id number] DOB: [birth year] Sex: male  12/14/2022 Pre-operative Diagnosis: Asymptomatic   right carotid stenosis Post-operative diagnosis:  Same Surgeon:  Durene Cal Assistants:  Doreatha Massed, PA Procedure:    right carotid Endarterectomy with bovine pericardial patch angioplasty Anesthesia:  General Blood Loss:  100 cc Specimens: None  Findings:  95 %stenosis; Thrombus:  none  Indications: This is a 77 year old gentleman who was found to have a high-grade nearly occlusive right carotid stenosis.  He comes in today for carotid endarterectomy.  He is asymptomatic.  Procedure:  The patient was identified in the holding area and taken to [facility] OR ROOM 11  The patient was then placed supine on the table.   General endotrachial anesthesia was administered.  The patient was prepped and draped in the usual sterile fashion.  A time out was called and antibiotics were administered.  PA was necessary to expedite the procedure and assist with technical details.  She help with exposure by providing suction and retraction.  She help with the patch repair by following the suture.  She help with wound closure.   The incision was made along the anterior border of the right sternocleidomastoid muscle.  Cautery was used to dissect through the subcutaneous tissue.  The platysma muscle was divided with cautery.  The internal jugular vein was exposed along its anterior medial border.  The common facial vein was exposed and then divided between 2-0 silk ties and metal clips.  The common carotid artery was then circumferentially exposed and encircled with an umbilical tape.  The vagus nerve was identified and protected.  Next sharp dissection was used to expose the external carotid artery and the superior thyroid artery.  The were encircled with a blue vessel loop and a 2-0 silk tie respectively.  Finally, the internal carotid was carefully dissected free.  An umbilical tape was  placed around the internal carotid artery distal to the diseased segment.  The hypoglossal nerve was visualized throughout and protected.  The patient was given systemic heparinization.  A bovine carotid patch was selected and prepared on the back table.  A 10 french shunt was also prepared.  After blood pressure readings were appropriate and the heparin had been given time to circulate, the internal carotid artery was occluded with a baby Gregory clamp.  The external and common carotid arteries were then occluded with vascular clamps and the 2-0 tie tightened on the superior thyroid artery.  A #11 blade was used to make an arteriotomy in the common carotid artery.  This was extended with Potts scissors along the anterior and lateral border of the common and internal carotid artery.  Approximately 95% stenosis was identified.  There was no thrombus identified.  The 10 french shunt was not placed, as there was excellent backbleeding.  A kleiner kuntz elevator was used to perform endarterectomy.  An eversion endarterectomy was performed in the external carotid artery.  A good distal endpoint was obtained in the internal carotid artery.  The specimen was removed and sent to pathology.  Heparinized saline was used to irrigate the endarterectomized field.  All potential embolic debris was removed.  Bovine pericardial patch angioplasty was then performed using a running 6-0 Prolene.  The common internal and external carotid arteries were all appropriately flushed. The artery was again irrigated with heparin saline.  The anastomosis was then secured. The clamp was first released on the external carotid artery followed by the common carotid artery approximately 30  seconds later, bloodflow was reestablish through the internal carotid artery.  Next, a hand-held  Doppler was used to evaluate the signals in the common, external, and internal  carotid arteries, all of which had appropriate signals. I then administered  50 mg  protamine. The wound was then irrigated.  After hemostasis was achieved, the carotid sheath was reapproximated with 3-0 Vicryl. The  platysma muscle was reapproximated with running 3-0 Vicryl. The skin  was closed with 4-0 Vicryl. Dermabond was placed on the skin. The  patient was then successfully extubated. His neurologic exam was  similar to his preprocedural exam. The patient was then taken to recovery room  in stable condition. There were no complications.     Disposition:  To PACU in stable condition.  Relevant Operative Details: There was a fair amount of inflammatory reaction around the carotid bulb.  The vagus nerve and hypoglossal nerve was visualized and protected.  Upon opening the carotid, it was found to be nearly occlusive.  There appeared to be what was a ruptured plaque as there was chronic thrombus/debris within the bulb.  There was excellent backbleeding and so no shunt was placed.  The ulcerated area and the debris were removed.  Endarterectomy was performed into the internal carotid artery for approximately 1-1/2 cm.  All the plaque was removed.  A bovine patch was placed.  The patient awoke neurologically intact  V. Durene Cal, M.D., [facility] [place] Office: [phone] Pager:  [phone]

## 2022-12-14 NOTE — Interval H&P Note (Signed)
History and Physical Interval Note:  12/14/2022 7:33 AM  [name] [name]  has presented today for surgery, with the diagnosis of Right carotid artery stenosis.  The various methods of treatment have been discussed with the patient and family. After consideration of risks, benefits and other options for treatment, the patient has consented to  Procedure(s): ENDARTERECTOMY CAROTID (Right) as a surgical intervention.  The patient's history has been reviewed, patient examined, no change in status, stable for surgery.  I have reviewed the patient's chart and labs.  Questions were answered to the patient's satisfaction.     Durene Cal

## 2022-12-14 NOTE — Anesthesia Procedure Notes (Signed)
Procedure Name: Intubation Date/Time: 12/14/2022 7:51 AM  Performed by: Georgianne Fick D, CRNAPre-anesthesia Checklist: Patient identified, Emergency Drugs available, Suction available and Patient being monitored Patient Re-evaluated:Patient Re-evaluated prior to induction Oxygen Delivery Method: Circle System Utilized Preoxygenation: Pre-oxygenation with 100% oxygen Induction Type: IV induction Ventilation: Mask ventilation without difficulty Laryngoscope Size: Mac and 4 Grade View: Grade I Tube type: Oral Tube size: 7.5 mm Number of attempts: 1 Airway Equipment and Method: Stylet and Oral airway Placement Confirmation: ETT inserted through vocal cords under direct vision, positive ETCO2 and breath sounds checked- equal and bilateral Secured at: 22 cm Tube secured with: Tape Dental Injury: Teeth and Oropharynx as per pre-operative assessment

## 2022-12-14 NOTE — Anesthesia Postprocedure Evaluation (Signed)
Anesthesia Post Note  Patient: ELENA COTHERN  Procedure(s) Performed: ENDARTERECTOMY RIGHT CAROTID (Right: Neck) PATCH ANGIOPLASTY USING XENOSURE 1CM X 6CM PATCH (Right: Neck)     Patient location during evaluation: PACU Anesthesia Type: General Level of consciousness: awake and alert Pain management: pain level controlled Vital Signs Assessment: post-procedure vital signs reviewed and stable Respiratory status: spontaneous breathing, nonlabored ventilation, respiratory function stable and patient connected to nasal cannula oxygen Cardiovascular status: blood pressure returned to baseline and stable Postop Assessment: no apparent nausea or vomiting Anesthetic complications: no  No notable events documented.  Last Vitals:  Vitals:   12/14/22 1100 12/14/22 1115  BP: 124/60 (!) 123/55  Pulse: 72 62  Resp: 17 17  Temp:    SpO2: 99% 97%    Last Pain:  Vitals:   12/14/22 1115  TempSrc:   PainSc: 0-No pain    LLE Motor Response: Purposeful movement (12/14/22 1115)   RLE Motor Response: Purposeful movement (12/14/22 1115)        Vibha Ferdig S

## 2022-12-14 NOTE — Transfer of Care (Signed)
Immediate Anesthesia Transfer of Care Note  Patient: PETROS AHART  Procedure(s) Performed: ENDARTERECTOMY RIGHT CAROTID (Right: Neck) PATCH ANGIOPLASTY USING XENOSURE 1CM X 6CM PATCH (Right: Neck)  Patient Location: PACU  Anesthesia Type:General  Level of Consciousness: awake, alert , and oriented  Airway & Oxygen Therapy: Patient Spontanous Breathing and Patient connected to nasal cannula oxygen  Post-op Assessment: Report given to RN, Post -op Vital signs reviewed and stable, Patient moving all extremities X 4, and Patient able to stick tongue midline  Post vital signs: Reviewed and stable  Last Vitals:  Vitals Value Taken Time  BP 123/57 12/14/22 1020  Temp    Pulse 85 12/14/22 1025  Resp 15 12/14/22 1025  SpO2 98 % 12/14/22 1025  Vitals shown include unfiled device data.  Last Pain:  Vitals:   12/14/22 0616  TempSrc:   PainSc: 0-No pain         Complications: No notable events documented.

## 2022-12-14 NOTE — Progress Notes (Signed)
Pt arrived to unit from PACU   A/O x 4,  CCMD called , pt oriented to unit. NIH 0. Right neck incision level 0.  Karna Christmas Nyjah Denio, RN    12/14/22 1451  Vitals  Temp (!) 97.5 F (36.4 C)  Temp Source Oral  BP (!) 120/57  MAP (mmHg) 76  BP Location Left Arm  BP Method Automatic  Patient Position (if appropriate) Lying  Pulse Rate 60  Pulse Rate Source Monitor  ECG Heart Rate 61  Resp 17  Level of Consciousness  Level of Consciousness Alert  Oxygen Therapy  SpO2 95 %  O2 Device Room Air  O2 Flow Rate (L/min) 0 L/min  Art Line  Arterial Line BP 121/54  Arterial Line MAP (mmHg) 77 mmHg  Pain Assessment  Pain Scale 0-10  Pain Score 0  MEWS Score  MEWS Temp 0  MEWS Systolic 0  MEWS Pulse 0  MEWS RR 0  MEWS LOC 0  MEWS Score 0  MEWS Score Color Chilton Si

## 2022-12-15 ENCOUNTER — Other Ambulatory Visit (HOSPITAL_COMMUNITY): Payer: Self-pay

## 2022-12-15 LAB — CBC
HCT: 38.4 % — ABNORMAL LOW (ref 39.0–52.0)
Hemoglobin: 12.7 g/dL — ABNORMAL LOW (ref 13.0–17.0)
MCH: 29.3 pg (ref 26.0–34.0)
MCHC: 33.1 g/dL (ref 30.0–36.0)
MCV: 88.7 fL (ref 80.0–100.0)
Platelets: 237 10*3/uL (ref 150–400)
RBC: 4.33 MIL/uL (ref 4.22–5.81)
RDW: 13.9 % (ref 11.5–15.5)
WBC: 14 10*3/uL — ABNORMAL HIGH (ref 4.0–10.5)
nRBC: 0 % (ref 0.0–0.2)

## 2022-12-15 LAB — LIPID PANEL
Cholesterol: 124 mg/dL (ref 0–200)
HDL: 27 mg/dL — ABNORMAL LOW (ref >40)
LDL Cholesterol: 80 mg/dL (ref 0–99)
Total CHOL/HDL Ratio: 4.6 {ratio}
Triglycerides: 86 mg/dL (ref <150)
VLDL: 17 mg/dL (ref 0–40)

## 2022-12-15 LAB — BASIC METABOLIC PANEL
Anion gap: 6 (ref 5–15)
BUN: 15 mg/dL (ref 8–23)
CO2: 23 mmol/L (ref 22–32)
Calcium: 9 mg/dL (ref 8.9–10.3)
Chloride: 108 mmol/L (ref 98–111)
Creatinine, Ser: 1.26 mg/dL — ABNORMAL HIGH (ref 0.61–1.24)
GFR, Estimated: 58 mL/min — ABNORMAL LOW (ref >60)
Glucose, Bld: 144 mg/dL — ABNORMAL HIGH (ref 70–99)
Potassium: 4.3 mmol/L (ref 3.5–5.1)
Sodium: 137 mmol/L (ref 135–145)

## 2022-12-15 LAB — PROTIME-INR
INR: 1.1 (ref 0.8–1.2)
Prothrombin Time: 14.7 s (ref 11.4–15.2)

## 2022-12-15 MED ORDER — WARFARIN SODIUM 5 MG PO TABS: 7.5000 mg | ORAL_TABLET | Freq: Every evening | ORAL | Status: DC

## 2022-12-15 MED ORDER — OXYCODONE-ACETAMINOPHEN 5-325 MG PO TABS
1.0000 | ORAL_TABLET | Freq: Four times a day (QID) | ORAL | 0 refills | Status: DC | PRN
  Filled 2022-12-15: qty 8, 2d supply, fill #0

## 2022-12-15 NOTE — Evaluation (Signed)
Physical Therapy Evaluation Patient Details Name: [name] [name] MRN: [id number] DOB: 29-Oct-1945 Today's Date: 12/15/2022  History of Present Illness  Pt is 77 yo male admitted for R CEA on 12/14/22.  Pt with hx including but not limited to AAA, CKD, CAD, HLD, HTN, MI  Clinical Impression  Pt admitted with above diagnosis. At baseline , pt is independent.  Today, pt initially mildly unsteady with standing, but when shoes donned and lines adjusted improved and able to ambulate 200' with supervision.  All VSS. Pt reports feeling at baseline and no further questions/concerns for PT.    Pt with no PT needs, will sign off.       If plan is discharge home, recommend the following:     Can travel by private vehicle        Equipment Recommendations None recommended by PT  Recommendations for Other Services       Functional Status Assessment Patient has not had a recent decline in their functional status     Precautions / Restrictions Precautions Precautions: None Restrictions Weight Bearing Restrictions: No      Mobility  Bed Mobility Overal bed mobility: Needs Assistance Bed Mobility: Supine to Sit, Sit to Supine     Supine to sit: Supervision Sit to supine: Supervision        Transfers Overall transfer level: Needs assistance Equipment used: None Transfers: Sit to/from Stand Sit to Stand: Contact guard assist, Supervision           General transfer comment: Performed x 2 progressing from CGA to SUPV    Ambulation/Gait Ambulation/Gait assistance: Contact guard assist, Supervision Gait Distance (Feet): 200 Feet Assistive device: None Gait Pattern/deviations: Step-through pattern Gait velocity: normal     General Gait Details: Some initial unsteadinees (pt concerned about lines, trying to adjust, and reports normally wears shoes).  Had pt return to sitting, don shoes, and improved steadiness with next attempt.  Ambulating 200' progressing to supervision for  lines.  Stairs            Wheelchair Mobility     Tilt Bed    Modified Rankin (Stroke Patients Only)       Balance Overall balance assessment: Needs assistance Sitting-balance support: No upper extremity supported Sitting balance-Leahy Scale: Good     Standing balance support: No upper extremity supported Standing balance-Leahy Scale: Good Standing balance comment: improves with shoes                             Pertinent Vitals/Pain Pain Assessment Pain Assessment: 0-10 Pain Score: 2  Pain Location: incision Pain Descriptors / Indicators: Discomfort Pain Intervention(s): Monitored during session    Home Living Family/patient expects to be discharged to:: Private residence Living Arrangements: Spouse/significant other Available Help at Discharge: Family;Available 24 hours/day Type of Home: House Home Access: Stairs to enter   [employer] of Steps: 1   Home Layout: Two level;Able to live on main level with bedroom/bathroom Home Equipment: Gilmer Mor - single point      Prior Function Prior Level of Function : Independent/Modified Independent                     Extremity/Trunk Assessment   Upper Extremity Assessment Upper Extremity Assessment: Overall WFL for tasks assessed    Lower Extremity Assessment Lower Extremity Assessment: Overall WFL for tasks assessed    Cervical / Trunk Assessment Cervical / Trunk Assessment: Normal  Communication  Cognition Arousal: Alert Behavior During Therapy: WFL for tasks assessed/performed Overall Cognitive Status: Within Functional Limits for tasks assessed                                          General Comments General comments (skin integrity, edema, etc.): VSS; BP stable in all positions    Exercises     Assessment/Plan    PT Assessment Patient does not need any further PT services  PT Problem List         PT Treatment Interventions      PT  Goals (Current goals can be found in the Care Plan section)  Acute Rehab PT Goals Patient Stated Goal: return home PT Goal Formulation: All assessment and education complete, DC therapy    Frequency       Co-evaluation               AM-PAC PT "6 Clicks" Mobility  Outcome Measure Help needed turning from your back to your side while in a flat bed without using bedrails?: None Help needed moving from lying on your back to sitting on the side of a flat bed without using bedrails?: A Little Help needed moving to and from a bed to a chair (including a wheelchair)?: A Little Help needed standing up from a chair using your arms (e.g., wheelchair or bedside chair)?: A Little Help needed to walk in hospital room?: A Little Help needed climbing 3-5 steps with a railing? : A Little 6 Click Score: 19    End of Session Equipment Utilized During Treatment: Gait belt Activity Tolerance: Patient tolerated treatment well Patient left: in bed;with call bell/phone within reach Nurse Communication: Mobility status;Other (comment) (can d/c from PT perspective (has orders)) PT Visit Diagnosis: Other abnormalities of gait and mobility (R26.89)    Time: 1110-1130 PT Time Calculation (min) (ACUTE ONLY): 20 min   Charges:   PT Evaluation $PT Eval Low Complexity: 1 Low   PT General Charges $$ ACUTE PT VISIT: 1 Visit         Anise Salvo, PT Acute Rehab [facility] Rehab [phone]   Rayetta Humphrey 12/15/2022, 11:38 AM

## 2022-12-15 NOTE — Plan of Care (Signed)
Problem: Education: Goal: Knowledge of discharge needs will improve 12/15/2022 1149 by Juluis Mire, RN Outcome: Adequate for Discharge 12/15/2022 1149 by Juluis Mire, RN Outcome: Adequate for Discharge   Problem: Clinical Measurements: Goal: Postoperative complications will be avoided or minimized 12/15/2022 1149 by Juluis Mire, RN Outcome: Adequate for Discharge 12/15/2022 1149 by Juluis Mire, RN Outcome: Adequate for Discharge   Problem: Respiratory: Goal: Ability to achieve and maintain a regular respiratory rate will improve 12/15/2022 1149 by Juluis Mire, RN Outcome: Adequate for Discharge 12/15/2022 1149 by Juluis Mire, RN Outcome: Adequate for Discharge   Problem: Skin Integrity: Goal: Demonstration of wound healing without infection will improve 12/15/2022 1149 by Juluis Mire, RN Outcome: Adequate for Discharge 12/15/2022 1149 by Juluis Mire, RN Outcome: Adequate for Discharge   Problem: Education: Goal: Knowledge of General Education information will improve Description: Including pain rating scale, medication(s)/side effects and non-pharmacologic comfort measures 12/15/2022 1149 by Juluis Mire, RN Outcome: Adequate for Discharge 12/15/2022 1149 by Juluis Mire, RN Outcome: Adequate for Discharge   Problem: Health Behavior/Discharge Planning: Goal: Ability to manage health-related needs will improve 12/15/2022 1149 by Juluis Mire, RN Outcome: Adequate for Discharge 12/15/2022 1149 by Juluis Mire, RN Outcome: Adequate for Discharge   Problem: Clinical Measurements: Goal: Ability to maintain clinical measurements within normal limits will improve 12/15/2022 1149 by Juluis Mire, RN Outcome: Adequate for Discharge 12/15/2022 1149 by Juluis Mire, RN Outcome: Adequate for Discharge Goal: Will remain free from infection 12/15/2022 1149 by Juluis Mire,  RN Outcome: Adequate for Discharge 12/15/2022 1149 by Juluis Mire, RN Outcome: Adequate for Discharge Goal: Diagnostic test results will improve 12/15/2022 1149 by Juluis Mire, RN Outcome: Adequate for Discharge 12/15/2022 1149 by Juluis Mire, RN Outcome: Adequate for Discharge Goal: Respiratory complications will improve 12/15/2022 1149 by Juluis Mire, RN Outcome: Adequate for Discharge 12/15/2022 1149 by Juluis Mire, RN Outcome: Adequate for Discharge Goal: Cardiovascular complication will be avoided 12/15/2022 1149 by Juluis Mire, RN Outcome: Adequate for Discharge 12/15/2022 1149 by Juluis Mire, RN Outcome: Adequate for Discharge   Problem: Activity: Goal: Risk for activity intolerance will decrease 12/15/2022 1149 by Juluis Mire, RN Outcome: Adequate for Discharge 12/15/2022 1149 by Juluis Mire, RN Outcome: Adequate for Discharge   Problem: Nutrition: Goal: Adequate nutrition will be maintained 12/15/2022 1149 by Juluis Mire, RN Outcome: Adequate for Discharge 12/15/2022 1149 by Juluis Mire, RN Outcome: Adequate for Discharge   Problem: Coping: Goal: Level of anxiety will decrease 12/15/2022 1149 by Juluis Mire, RN Outcome: Adequate for Discharge 12/15/2022 1149 by Juluis Mire, RN Outcome: Adequate for Discharge   Problem: Elimination: Goal: Will not experience complications related to bowel motility 12/15/2022 1149 by Juluis Mire, RN Outcome: Adequate for Discharge 12/15/2022 1149 by Juluis Mire, RN Outcome: Adequate for Discharge Goal: Will not experience complications related to urinary retention 12/15/2022 1149 by Juluis Mire, RN Outcome: Adequate for Discharge 12/15/2022 1149 by Juluis Mire, RN Outcome: Adequate for Discharge   Problem: Pain Management: Goal: General experience of comfort will improve 12/15/2022 1149 by Juluis Mire, RN Outcome: Adequate for Discharge 12/15/2022 1149 by Juluis Mire, RN Outcome: Adequate for Discharge   Problem: Safety: Goal: Ability to remain free from injury will improve 12/15/2022 1149 by Juluis Mire, RN Outcome: Adequate for Discharge 12/15/2022 1149 by Juluis Mire, RN  Outcome: Adequate for Discharge   Problem: Skin Integrity: Goal: Risk for impaired skin integrity will decrease 12/15/2022 1149 by Juluis Mire, RN Outcome: Adequate for Discharge 12/15/2022 1149 by Juluis Mire, RN Outcome: Adequate for Discharge

## 2022-12-15 NOTE — Progress Notes (Addendum)
  Progress Note    12/15/2022 8:20 AM 1 Day Post-Op  Subjective:  a little sore but otherwise, no complaints.  Has not been out of bed.   Afebrile HR 50's-80's NSR 100's-150's systolic 96% RA  Gtts:  none  Vitals:   12/15/22 0300 12/15/22 0423  BP:  119/60  Pulse:  77  Resp:  18  Temp: 98.2 F (36.8 C) 98.2 F (36.8 C)  SpO2:  100%     Physical Exam: Neuro:  in tact; tongue is midline Lungs:  non labored Incision:  clean and dry  CBC    Component Value Date/Time   WBC 14.0 (H) 12/15/2022 0302   RBC 4.33 12/15/2022 0302   HGB 12.7 (L) 12/15/2022 0302   HGB 14.5 12/25/2021 0954   HCT 38.4 (L) 12/15/2022 0302   HCT 42.8 12/25/2021 0954   PLT 237 12/15/2022 0302   PLT 322 12/25/2021 0954   MCV 88.7 12/15/2022 0302   MCV 87 12/25/2021 0954   MCH 29.3 12/15/2022 0302   MCHC 33.1 12/15/2022 0302   RDW 13.9 12/15/2022 0302   RDW 14.3 12/25/2021 0954   LYMPHSABS 1.8 02/10/2020 0943   MONOABS 1.0 10/24/2013 0635   EOSABS 0.4 02/10/2020 0943   BASOSABS 0.1 02/10/2020 0943    BMET    Component Value Date/Time   NA 137 12/15/2022 0302   NA 142 12/25/2021 0954   K 4.3 12/15/2022 0302   CL 108 12/15/2022 0302   CO2 23 12/15/2022 0302   GLUCOSE 144 (H) 12/15/2022 0302   BUN 15 12/15/2022 0302   BUN 16 12/25/2021 0954   CREATININE 1.26 (H) 12/15/2022 0302   CREATININE 1.18 01/25/2017 1129   CALCIUM 9.0 12/15/2022 0302   GFRNONAA 58 (L) 12/15/2022 0302   GFRNONAA 61 01/25/2017 1129   GFRAA 67 02/10/2020 0943   GFRAA 71 01/25/2017 1129     Intake/Output Summary (Last 24 hours) at 12/15/2022 0820 Last data filed at 12/15/2022 0600 Gross per 24 hour  Intake 1310 ml  Output 1325 ml  Net -15 ml     Assessment/Plan:  This is a 77 y.o. male who is s/p right CEA  1 Day Post-Op  -pt is doing well this am. -pt neuro exam is in tact -pt has not ambulated-needs to mobilize in hallways -pt has voided -received coumadin 7.5mg  last evening.  INR today is  1.1 -f/u with [facility] in 2-3 weeks on Dr. Myra Gianotti clinic day.   Doreatha Massed, PA-C [facility] [phone]  I agree with the above.  I have seen and evaluate the patient.  He is postoperative #1 from a right carotid endarterectomy.  He is neurologically intact.  There is no evidence of hematoma.  His Coumadin was reinitiated last night.  Anticipate discharge home today  Durene Cal

## 2022-12-15 NOTE — Plan of Care (Signed)
  Problem: Respiratory: Goal: Ability to achieve and maintain a regular respiratory rate will improve Outcome: Adequate for Discharge

## 2022-12-15 NOTE — Discharge Summary (Signed)
Discharge Summary     [name] [name] 1945/02/18 77 y.o. male  [id number]  Admission Date: 12/14/2022  Discharge Date: 12/15/2022  Physician: Nada Libman, MD  Admission Diagnosis: Status post carotid endarterectomy [Z98.890] Asymptomatic carotid artery stenosis without infarction, right [I65.21]   HPI:   This is a 77 y.o. male gentleman who was found to have a high-grade nearly occlusive right carotid stenosis. He comes in today for carotid endarterectomy. He is asymptomatic.   Hospital Course:  The patient was admitted to the hospital and taken to the operating room on 12/14/2022 and underwent right CEA    Findings: 95 %stenosis; Thrombus:  none   The pt tolerated the procedure well and was transported to the PACU in good condition.   By POD 1, the pt neuro status was in tact and he was doing well.  He was able to ambulate, void and swallow without difficulty.  He was discharged home.  He was to continue his coumadin.  Continue asa/statin.   Recent Labs    12/15/22 0302  NA 137  K 4.3  CL 108  CO2 23  GLUCOSE 144*  BUN 15  CALCIUM 9.0   Recent Labs    12/15/22 0302  WBC 14.0*  HGB 12.7*  HCT 38.4*  PLT 237   Recent Labs    12/14/22 0605 12/15/22 0302  INR 1.2 1.1     Discharge Instructions     Discharge patient   Complete by: As directed    Discharge home when he has walked in hallways   Discharge disposition: 01-Home or Self Care   Discharge patient date: 12/15/2022       Discharge Diagnosis:  Status post carotid endarterectomy [Z98.890] Asymptomatic carotid artery stenosis without infarction, right [I65.21]  Secondary Diagnosis: Patient Active Problem List   Diagnosis Date Noted   Status post carotid endarterectomy 12/14/2022   Asymptomatic carotid artery stenosis without infarction, right 12/14/2022   Cutaneous horn 06/07/2022   Encounter for routine adult physical exam with abnormal findings 03/02/2022   Essential  hypertension 12/21/2021   Prediabetes 12/21/2021   Elevated PSA 12/21/2021   Abdominal aortic aneurysm (AAA) without rupture (HCC) 03/01/2021   Medicare annual wellness visit, subsequent 03/01/2021   Chewing tobacco nicotine dependence without complication 03/01/2021   Cataract 08/13/2014   PAD (peripheral artery disease) (HCC) 10/29/2013   Popliteal artery occlusion, left (HCC) 10/23/2013   PVD (peripheral vascular disease) (HCC) 08/03/2013   AAA (abdominal aortic aneurysm) (HCC) 06/23/2013   Popliteal aneurysm (HCC) 07/28/2012   Aneurysm of artery of lower extremity (HCC) 12/03/2011   Aneurysm of abdominal vessel (HCC) 05/28/2011   Hyperlipidemia 10/11/2009   Coronary artery disease involving native coronary artery of native heart without angina pectoris 10/11/2009   Past Medical History:  Diagnosis Date   Abdominal aortic aneurysm (HCC)    Cancer (HCC)    skin   Chronic kidney disease    Right kidney stone   Coronary artery disease    Detached retina    Hyperlipidemia    Hypertension    Myocardial infarction [facility])    2007 heart stent   Popliteal aneurysm (HCC)     Allergies as of 12/15/2022       Reactions   Omnipaque [iohexol] Rash, Other (See Comments)   Pt requires full premeds and needs to bring a driver.  Broke out in rash on back and arms.  Ok w/ premeds   Oxytetracycline Rash, Other (See Comments)   Terramycin causes rash  Alteplase Other (See Comments)   Internal bleeding, per RN in short stay. Patient requested this to be added to his allergy list        Medication List     TAKE these medications    aspirin EC 81 MG tablet Take 81 mg by mouth daily. Swallow whole.   atorvastatin 40 MG tablet Commonly known as: LIPITOR TAKE 1 TABLET BY MOUTH EVERY DAY   Co Q-10 100 MG Caps Take 100 capsules by mouth daily.   fenofibrate 54 MG tablet Take 1 tablet (54 mg total) by mouth daily.   Fish Oil Triple Strength 1400 MG Caps Take 2,800 mg by mouth 2  (two) times daily.   metoprolol succinate 25 MG 24 hr tablet Commonly known as: TOPROL-XL TAKE 1 TABLET (25 MG TOTAL) BY MOUTH DAILY.   multivitamin with minerals Tabs tablet Take 1 tablet by mouth daily.   oxyCODONE-acetaminophen 5-325 MG tablet Commonly known as: Percocet Take 1 tablet by mouth every 6 (six) hours as needed.   Saw Palmetto 450 MG Caps Take 900 mg by mouth 2 (two) times daily.   vitamin C 1000 MG tablet Take 500 mg by mouth daily.   warfarin 5 MG tablet Commonly known as: COUMADIN Take as directed. If you are unsure how to take this medication, talk to your nurse or doctor. Original instructions: Take 1.5 tablets (7.5 mg total) by mouth every evening.   Zinc Acetate 25 MG Caps Take 25 mg by mouth daily.         [facility] Discharge Instructions Carotid Endarterectomy (CEA)  Please refer to the following instructions for your post-procedure care. Your surgeon or physician assistant will discuss any changes with you.  Activity  You are encouraged to walk as much as you can. You can slowly return to normal activities but must avoid strenuous activity and heavy lifting until your doctor tell you it's OK. Avoid activities such as vacuuming or swinging a golf club. You can drive after one week if you are comfortable and you are no longer taking prescription pain medications. It is normal to feel tired for serval weeks after your surgery. It is also normal to have difficulty with sleep habits, eating, and bowel movements after surgery. These will go away with time.  Bathing/Showering  You may shower after you come home. Do not soak in a bathtub, hot tub, or swim until the incision heals completely.  Incision Care  Shower every day. Clean your incision with mild soap and water. Pat the area dry with a clean towel. You do not need a bandage unless otherwise instructed. Do not apply any ointments or creams to your incision. You  may have skin glue on your incision. Do not peel it off. It will come off on its own in about one week. Your incision may feel thickened and raised for several weeks after your surgery. This is normal and the skin will soften over time. For Men Only: It's OK to shave around the incision but do not shave the incision itself for 2 weeks. It is common to have numbness under your chin that could last for several months.  Diet  Resume your normal diet. There are no special food restrictions following this procedure. A low fat/low cholesterol diet is recommended for all patients with vascular disease. In order to heal from your surgery, it is CRITICAL to get adequate nutrition. Your body requires vitamins, minerals, and protein. Vegetables are the best  source of vitamins and minerals. Vegetables also provide the perfect balance of protein. Processed food has little nutritional value, so try to avoid this.  Medications  Resume taking all of your medications unless your doctor or physician assistant tells you not to.  If your incision is causing pain, you may take over-the- counter pain relievers such as acetaminophen (Tylenol). If you were prescribed a stronger pain medication, please be aware these medications can cause nausea and constipation.  Prevent nausea by taking the medication with a snack or meal. Avoid constipation by drinking plenty of fluids and eating foods with a high amount of fiber, such as fruits, vegetables, and grains.  Do not take Tylenol if you are taking prescription pain medications.  Follow Up  Our office will schedule a follow up appointment 2-3 weeks following discharge.  Please call us immediately for any of the following conditions  Increased pain, redness, drainage (pus) from your incision site. Fever of 101 degrees or higher. If you should develop stroke (slurred speech, difficulty swallowing, weakness on one side of your body, loss of vision) you should call 911 and go to  the nearest emergency room.  Reduce your risk of vascular disease:  Stop smoking. If you would like help call [facility] at 1-800-QUIT-NOW ((870)199-6993) or [place] at (517)642-3246. Manage your cholesterol Maintain a desired weight Control your diabetes Keep your blood pressure down  If you have any questions, please call the office at [phone].  Prescriptions given: 1.   Roxicet #8 No Refill  Disposition: home  Patient's condition: is Good  Follow up: 1. [facility] in 2-3 weeks on Dr. Myra Gianotti clinic day.   Doreatha Massed, PA-C [facility] [phone]   --- For [facility] use ---   Modified Rankin score at D/C (0-6): 0  IV medication needed for:  1. Hypertension: No 2. Hypotension: No  Post-op Complications: No  1. Post-op CVA or TIA: No  If yes: Event classification (right eye, left eye, right cortical, left cortical, verterobasilar, other): n/a  If yes: Timing of event (intra-op, <6 hrs post-op, >=6 hrs post-op, unknown): n/a  2. CN injury: No  If yes: CN n/a injuried   3. Myocardial infarction: No  If yes: Dx by (EKG or clinical, Troponin): n/a  4.  CHF: No  5.  Dysrhythmia (new): No  6. Wound infection: No  7. Reperfusion symptoms: No  8. Return to OR: No  If yes: return to OR for (bleeding, neurologic, other CEA incision, other): n/a  Discharge medications: Statin use:  Yes ASA use:  Yes   Beta blocker use:  Yes ACE-Inhibitor use:  No  ARB use:  No CCB use: No P2Y12 Antagonist use: No, [ ]  Plavix, [ ]  Plasugrel, [ ]  Ticlopinine, [ ]  Ticagrelor, [ ]  Other, [ ]  No for medical reason, [ ]  Non-compliant, [ ]  Not-indicated Anti-coagulant use:  Yes, [x ] Warfarin, [ ]  Rivaroxaban, [ ]  Dabigatran,

## 2022-12-16 ENCOUNTER — Encounter (HOSPITAL_COMMUNITY): Payer: Self-pay | Admitting: Surgery

## 2022-12-31 ENCOUNTER — Ambulatory Visit (INDEPENDENT_AMBULATORY_CARE_PROVIDER_SITE_OTHER): Payer: Medicare Other | Admitting: Physician Assistant

## 2022-12-31 VITALS — BP 116/68 | HR 58 | Temp 97.7°F | Ht 74.0 in | Wt 202.8 lb

## 2022-12-31 DIAGNOSIS — I6521 Occlusion and stenosis of right carotid artery: Secondary | ICD-10-CM

## 2022-12-31 NOTE — Progress Notes (Unsigned)
POST OPERATIVE OFFICE NOTE    CC:  F/u for surgery  HPI:  [name] [name] is a 77 y.o. male who is s/p right carotid endarterectomy on 12/14/2022 by Dr.Brabham. This was done for an incidental finding of asymptomatic critical right ICA stenosis. He has a history of AAA s/p EVAR. He also has a history of left lower extremity bypass and a right popliteal aneurysm we are following.  He returns today for follow up. He states he has been doing well since surgery. He denies any stroke like symptoms such as slurred speech, facial droop, sudden visual changes, or sudden weakness/numbness. He also denies any claudication, rest pain, or tissue loss. His right sided neck incision has been healing well. He denies any fevers.   Allergies  Allergen Reactions   Omnipaque [Iohexol] Rash and Other (See Comments)    Pt requires full premeds and needs to bring a driver.  Broke out in rash on back and arms.  Ok w/ premeds   Oxytetracycline Rash and Other (See Comments)    Terramycin causes rash   Alteplase Other (See Comments)    Internal bleeding, per RN in short stay. Patient requested this to be added to his allergy list    Current Outpatient Medications  Medication Sig Dispense Refill   Ascorbic Acid (VITAMIN C) 1000 MG tablet Take 500 mg by mouth daily.     aspirin EC 81 MG tablet Take 81 mg by mouth daily. Swallow whole.     atorvastatin (LIPITOR) 40 MG tablet TAKE 1 TABLET BY MOUTH EVERY DAY 90 tablet 3   Coenzyme Q10 (CO Q-10) 100 MG CAPS Take 100 capsules by mouth daily.  0   fenofibrate 54 MG tablet Take 1 tablet (54 mg total) by mouth daily. 90 tablet 3   metoprolol succinate (TOPROL-XL) 25 MG 24 hr tablet TAKE 1 TABLET (25 MG TOTAL) BY MOUTH DAILY. 90 tablet 3   Multiple Vitamin (MULTIVITAMIN WITH MINERALS) TABS tablet Take 1 tablet by mouth daily.     Omega-3 Fatty Acids (FISH OIL TRIPLE STRENGTH) 1400 MG CAPS Take 2,800 mg by mouth 2 (two) times daily.     oxyCODONE-acetaminophen  (PERCOCET) 5-325 MG tablet Take 1 tablet by mouth every 6 (six) hours as needed. 8 tablet 0   Saw Palmetto 450 MG CAPS Take 900 mg by mouth 2 (two) times daily.     warfarin (COUMADIN) 5 MG tablet Take 1.5 tablets (7.5 mg total) by mouth every evening.     Zinc Acetate 25 MG CAPS Take 25 mg by mouth daily.     No current facility-administered medications for this visit.     ROS:  See HPI  Physical Exam:   Incision:  right sided neck incision well healed without erythema, tenderness, or hematoma Extremities:  palpable radial pulses bilaterally Neuro: Intact.  No slurred speech or facial droop.  Moving all extremities equally    Assessment/Plan:  This is a 77 y.o. male who is here for postop visit  -The patient recently underwent right carotid endarterectomy for asymptomatic critical ICA stenosis greater than 80% -On exam his right sided neck incision is well-healed without signs of infection or hematoma.  There is no erythema or tenderness.  He denies any fevers at home -He denies any strokelike symptoms such as slurred speech, facial droop, amaurosis fugax, or sudden weakness/numbness -We will plan for follow-up with our office in 9 months with repeat carotid duplex.  In 1 year he is also due for repeat  lower extremity studies   [name], PA-C [facility] [phone]   Clinic MD:  Myra Gianotti

## 2023-01-01 ENCOUNTER — Other Ambulatory Visit: Payer: Self-pay | Admitting: Family Medicine

## 2023-01-01 DIAGNOSIS — I739 Peripheral vascular disease, unspecified: Secondary | ICD-10-CM

## 2023-01-01 NOTE — Telephone Encounter (Signed)
Please advise 

## 2023-01-07 ENCOUNTER — Other Ambulatory Visit: Payer: Self-pay

## 2023-01-07 DIAGNOSIS — I6523 Occlusion and stenosis of bilateral carotid arteries: Secondary | ICD-10-CM

## 2023-01-17 ENCOUNTER — Telehealth: Payer: Self-pay

## 2023-01-17 NOTE — Telephone Encounter (Signed)
Scheduled for 01/23/23 at 3:30

## 2023-01-17 NOTE — Telephone Encounter (Signed)
Copied from CRM [id number]. Topic: Appointment Scheduling - Scheduling Inquiry for Clinic >> Jan 17, 2023 12:39 PM [place] B wrote: Reason for CRM: pt called in to schedule PT test

## 2023-01-23 ENCOUNTER — Ambulatory Visit: Payer: Medicare Other

## 2023-01-23 ENCOUNTER — Telehealth: Payer: Self-pay

## 2023-01-23 DIAGNOSIS — I739 Peripheral vascular disease, unspecified: Secondary | ICD-10-CM | POA: Diagnosis not present

## 2023-01-23 LAB — POCT INR
INR: 2.3 (ref 2.0–3.0)
PT: 27.3

## 2023-01-23 NOTE — Telephone Encounter (Signed)
Patient would like to have labs done 02/07/23 before his appointment 02/14/2023.  He is going to need his INR as well.

## 2023-01-23 NOTE — Patient Instructions (Signed)
Description   Continue 7.5 mg daily.  Appointment in 3 weeks

## 2023-02-07 ENCOUNTER — Other Ambulatory Visit: Payer: Self-pay | Admitting: Family Medicine

## 2023-02-07 DIAGNOSIS — D6869 Other thrombophilia: Secondary | ICD-10-CM

## 2023-02-08 LAB — PROTIME-INR
INR: 2.4 — ABNORMAL HIGH (ref 0.9–1.2)
Prothrombin Time: 25.1 s — ABNORMAL HIGH (ref 9.1–12.0)

## 2023-02-08 LAB — APTT: aPTT: 41 s — ABNORMAL HIGH (ref 24–33)

## 2023-02-14 ENCOUNTER — Ambulatory Visit: Payer: Medicare Other | Admitting: Family Medicine

## 2023-02-14 VITALS — BP 114/60 | HR 60 | Ht 74.0 in | Wt 205.0 lb

## 2023-02-14 DIAGNOSIS — D6869 Other thrombophilia: Secondary | ICD-10-CM

## 2023-02-14 DIAGNOSIS — I739 Peripheral vascular disease, unspecified: Secondary | ICD-10-CM

## 2023-02-14 DIAGNOSIS — E782 Mixed hyperlipidemia: Secondary | ICD-10-CM

## 2023-02-14 DIAGNOSIS — Z9889 Other specified postprocedural states: Secondary | ICD-10-CM

## 2023-02-14 DIAGNOSIS — Z89429 Acquired absence of other toe(s), unspecified side: Secondary | ICD-10-CM | POA: Insufficient documentation

## 2023-02-14 DIAGNOSIS — E785 Hyperlipidemia, unspecified: Secondary | ICD-10-CM | POA: Diagnosis not present

## 2023-02-14 DIAGNOSIS — D649 Anemia, unspecified: Secondary | ICD-10-CM

## 2023-02-14 DIAGNOSIS — R972 Elevated prostate specific antigen [PSA]: Secondary | ICD-10-CM

## 2023-02-14 DIAGNOSIS — N401 Enlarged prostate with lower urinary tract symptoms: Secondary | ICD-10-CM

## 2023-02-14 DIAGNOSIS — I10 Essential (primary) hypertension: Secondary | ICD-10-CM

## 2023-02-14 DIAGNOSIS — R7303 Prediabetes: Secondary | ICD-10-CM

## 2023-02-14 DIAGNOSIS — R7989 Other specified abnormal findings of blood chemistry: Secondary | ICD-10-CM

## 2023-02-14 DIAGNOSIS — D729 Disorder of white blood cells, unspecified: Secondary | ICD-10-CM

## 2023-02-14 MED ORDER — WARFARIN SODIUM 5 MG PO TABS: 7.5000 mg | ORAL_TABLET | Freq: Every day | ORAL | Status: DC

## 2023-02-14 MED ORDER — ATORVASTATIN CALCIUM 40 MG PO TABS: 40.0000 mg | ORAL_TABLET | Freq: Every day | ORAL | Status: DC

## 2023-02-14 MED ORDER — FISH OIL TRIPLE STRENGTH 1400 MG PO CAPS
2800.0000 mg | ORAL_CAPSULE | Freq: Two times a day (BID) | ORAL | Status: AC
Start: 2023-02-14 — End: ?

## 2023-02-14 MED ORDER — SAW PALMETTO 450 MG PO CAPS: 900.0000 mg | ORAL_CAPSULE | Freq: Two times a day (BID) | ORAL | Status: AC

## 2023-02-14 MED ORDER — FENOFIBRATE 54 MG PO TABS: 54.0000 mg | ORAL_TABLET | Freq: Every day | ORAL | Status: DC

## 2023-02-14 MED ORDER — CO Q-10 100 MG PO CAPS: 100.0000 | ORAL_CAPSULE | Freq: Every day | ORAL | Status: AC

## 2023-02-14 MED ORDER — ASPIRIN 81 MG PO TBEC
81.0000 mg | DELAYED_RELEASE_TABLET | Freq: Every day | ORAL | Status: AC
Start: 2023-02-14 — End: ?

## 2023-02-14 MED ORDER — METOPROLOL SUCCINATE ER 25 MG PO TB24: 25.0000 mg | ORAL_TABLET | Freq: Every day | ORAL | Status: DC

## 2023-02-14 NOTE — Assessment & Plan Note (Signed)
 Repeat TSH given known HTN

## 2023-02-14 NOTE — Assessment & Plan Note (Signed)
 Complete in November; R neck site is well approximated Repeat CMP given creatinine elevation as well as CBC given WBC elevation with borderline low H&H

## 2023-02-14 NOTE — Assessment & Plan Note (Signed)
 Notes some complaints of LUTS; repeat PSA

## 2023-02-14 NOTE — Assessment & Plan Note (Signed)
 Planned appt with podiatry for nail debridement/maintenance

## 2023-02-14 NOTE — Assessment & Plan Note (Signed)
Chronic, repeat A1c Continue to recommend balanced, lower carb meals. Smaller meal size, adding snacks. Choosing water as drink of choice and increasing purposeful exercise.

## 2023-02-14 NOTE — Assessment & Plan Note (Signed)
 Chronic elevation with known PAD and PVD Fats at goal; repeat LP for LDL Goal remains <50 to best prevent further ASCVD events

## 2023-02-14 NOTE — Assessment & Plan Note (Signed)
 Chronic, stable Continue warfarin with INR dosing range of 2.5-3.5 to assist BP and Lipids at goal Continue multifocal medication to assist lifestyle

## 2023-02-14 NOTE — Progress Notes (Signed)
 Established patient visit   Patient: [name] [name]   DOB: 1945-04-12   78 y.o. Male  MRN: [id number] Visit Date: 02/14/2023  Today's healthcare provider: Kelly ONEIDA Cedar, FNP  Introduced to nurse practitioner role and practice setting.  All questions answered.  Discussed provider/patient relationship and expectations.  Chief Complaint  Patient presents with   Follow-up    follw-up chronic disease   Subjective    HPI HPI     Follow-up    Additional comments: follw-up chronic disease      Last edited by Deitra Therisa HERO, CMA on 02/14/2023  1:06 PM.      INR checked and borderline low at 2.4 on 02/07/23  Medications: Outpatient Medications Prior to Visit  Medication Sig   Ascorbic Acid (VITAMIN C) 1000 MG tablet Take 500 mg by mouth daily.   Multiple Vitamin (MULTIVITAMIN WITH MINERALS) TABS tablet Take 1 tablet by mouth daily.   Zinc Acetate 25 MG CAPS Take 25 mg by mouth daily.   [DISCONTINUED] aspirin  EC 81 MG tablet Take 81 mg by mouth daily. Swallow whole.   [DISCONTINUED] atorvastatin  (LIPITOR) 40 MG tablet TAKE 1 TABLET BY MOUTH EVERY DAY   [DISCONTINUED] Coenzyme Q10 (CO Q-10) 100 MG CAPS Take 100 capsules by mouth daily.   [DISCONTINUED] fenofibrate  54 MG tablet Take 1 tablet (54 mg total) by mouth daily.   [DISCONTINUED] metoprolol  succinate (TOPROL -XL) 25 MG 24 hr tablet TAKE 1 TABLET (25 MG TOTAL) BY MOUTH DAILY.   [DISCONTINUED] Omega-3 Fatty Acids (FISH OIL  TRIPLE STRENGTH) 1400 MG CAPS Take 2,800 mg by mouth 2 (two) times daily.   [DISCONTINUED] oxyCODONE -acetaminophen  (PERCOCET) 5-325 MG tablet Take 1 tablet by mouth every 6 (six) hours as needed.   [DISCONTINUED] Saw Palmetto  450 MG CAPS Take 900 mg by mouth 2 (two) times daily.   [DISCONTINUED] warfarin (COUMADIN ) 5 MG tablet TAKE 1 AND 1/2 TABLETS DAILY BY MOUTH   No facility-administered medications prior to visit.   Last CBC Lab Results  Component Value Date   WBC 14.0 (H) 12/15/2022   HGB 12.7  (L) 12/15/2022   HCT 38.4 (L) 12/15/2022   MCV 88.7 12/15/2022   MCH 29.3 12/15/2022   RDW 13.9 12/15/2022   PLT 237 12/15/2022   Last metabolic panel Lab Results  Component Value Date   GLUCOSE 144 (H) 12/15/2022   NA 137 12/15/2022   K 4.3 12/15/2022   CL 108 12/15/2022   CO2 23 12/15/2022   BUN 15 12/15/2022   CREATININE 1.26 (H) 12/15/2022   GFRNONAA 58 (L) 12/15/2022   CALCIUM  9.0 12/15/2022   PROT 7.3 12/12/2022   ALBUMIN  3.7 12/12/2022   LABGLOB 2.8 12/25/2021   AGRATIO 1.5 12/25/2021   BILITOT 0.9 12/12/2022   ALKPHOS 70 12/12/2022   AST 24 12/12/2022   ALT 26 12/12/2022   ANIONGAP 6 12/15/2022   Last lipids Lab Results  Component Value Date   CHOL 124 12/15/2022   HDL 27 (L) 12/15/2022   LDLCALC 80 12/15/2022   TRIG 86 12/15/2022   CHOLHDL 4.6 12/15/2022   Last hemoglobin A1c Lab Results  Component Value Date   HGBA1C 5.6 10/24/2013   Last thyroid  functions Lab Results  Component Value Date   TSH 4.430 12/25/2021   T3TOTAL 114 09/08/2018   T4TOTAL 6.7 09/08/2018      Objective    BP 114/60 (BP Location: Right Arm, Patient Position: Sitting, Cuff Size: Large)   Pulse 60   Ht  6' 2 (1.88 m)   Wt 205 lb (93 kg)   SpO2 98%   BMI 26.32 kg/m   BP Readings from Last 3 Encounters:  02/14/23 114/60  12/31/22 116/68  12/15/22 133/65   Wt Readings from Last 3 Encounters:  02/14/23 205 lb (93 kg)  12/31/22 202 lb 12.8 oz (92 kg)  12/14/22 195 lb (88.5 kg)   SpO2 Readings from Last 3 Encounters:  02/14/23 98%  12/31/22 97%  12/15/22 96%   Physical Exam Vitals and nursing note reviewed.  Constitutional:      Appearance: Normal appearance. He is overweight.  HENT:     Head: Normocephalic and atraumatic.  Neck:     Comments: R neck surgical site is healing well. Cardiovascular:     Rate and Rhythm: Normal rate and regular rhythm.     Pulses: Normal pulses.     Heart sounds: Normal heart sounds.  Pulmonary:     Effort: Pulmonary effort  is normal.     Breath sounds: Normal breath sounds.  Musculoskeletal:        General: Normal range of motion.     Cervical back: Normal range of motion.  Feet:     Comments: Pt plans to follow up on toe nail filing with podiatry following history of toe amputation given PAD/PVD. Skin:    General: Skin is warm and dry.     Capillary Refill: Capillary refill takes less than 2 seconds.  Neurological:     General: No focal deficit present.     Mental Status: He is alert and oriented to person, place, and time. Mental status is at baseline.     Comments: Denies falls  Psychiatric:        Mood and Affect: Mood normal.        Behavior: Behavior normal.        Thought Content: Thought content normal.        Judgment: Judgment normal.    No results found for any visits on 02/14/23.  Assessment & Plan     Problem List Items Addressed This Visit       Cardiovascular and Mediastinum   Essential hypertension   Relevant Medications   aspirin  EC 81 MG tablet   atorvastatin  (LIPITOR) 40 MG tablet   fenofibrate  54 MG tablet   metoprolol  succinate (TOPROL -XL) 25 MG 24 hr tablet   warfarin (COUMADIN ) 5 MG tablet   PAD (peripheral artery disease) (HCC)   Relevant Medications   aspirin  EC 81 MG tablet   atorvastatin  (LIPITOR) 40 MG tablet   fenofibrate  54 MG tablet   metoprolol  succinate (TOPROL -XL) 25 MG 24 hr tablet   warfarin (COUMADIN ) 5 MG tablet   Other Relevant Orders   CBC with Differential/Platelet   Comprehensive Metabolic Panel (CMET)   Vitamin D  (25 hydroxy)   PVD (peripheral vascular disease) (HCC)   Chronic, stable Continue warfarin with INR dosing range of 2.5-3.5 to assist BP and Lipids at goal Continue multifocal medication to assist lifestyle      Relevant Medications   aspirin  EC 81 MG tablet   atorvastatin  (LIPITOR) 40 MG tablet   Coenzyme Q10 (CO Q-10) 100 MG CAPS   fenofibrate  54 MG tablet   metoprolol  succinate (TOPROL -XL) 25 MG 24 hr tablet   Omega-3  Fatty Acids (FISH OIL  TRIPLE STRENGTH) 1400 MG CAPS   warfarin (COUMADIN ) 5 MG tablet     Other   Elevated PSA   Notes some complaints of LUTS; repeat PSA  Relevant Orders   PSA   Elevated TSH   Repeat TSH given known HTN      Relevant Orders   TSH   Hypercoagulable state, secondary (HCC) - Primary   Chronic, borderline repeat INR per pt request; last at 2.4 Wishes to re-establish on coumadin  clinic days       Relevant Medications   metoprolol  succinate (TOPROL -XL) 25 MG 24 hr tablet   warfarin (COUMADIN ) 5 MG tablet   Hyperlipidemia   Chronic elevation with known PAD and PVD Fats at goal; repeat LP for LDL Goal remains <50 to best prevent further ASCVD events      Relevant Medications   aspirin  EC 81 MG tablet   atorvastatin  (LIPITOR) 40 MG tablet   Coenzyme Q10 (CO Q-10) 100 MG CAPS   fenofibrate  54 MG tablet   metoprolol  succinate (TOPROL -XL) 25 MG 24 hr tablet   Omega-3 Fatty Acids (FISH OIL  TRIPLE STRENGTH) 1400 MG CAPS   warfarin (COUMADIN ) 5 MG tablet   Other Relevant Orders   INR/PT   Lipid panel   Prediabetes   Chronic, repeat A1c Continue to recommend balanced, lower carb meals. Smaller meal size, adding snacks. Choosing water as drink of choice and increasing purposeful exercise.       Relevant Orders   Hemoglobin A1c   Status post amputation of lesser toe (HCC)   Planned appt with podiatry for nail debridement/maintenance       Relevant Medications   aspirin  EC 81 MG tablet   atorvastatin  (LIPITOR) 40 MG tablet   Status post carotid endarterectomy   Complete in November; R neck site is well approximated Repeat CMP given creatinine elevation as well as CBC given WBC elevation with borderline low H&H      Relevant Medications   aspirin  EC 81 MG tablet   atorvastatin  (LIPITOR) 40 MG tablet   Other Visit Diagnoses       Benign localized prostatic hyperplasia with lower urinary tract symptoms (LUTS)       Relevant Medications   Saw  Palmetto 450 MG CAPS      Return in about 3 weeks (around 03/07/2023), or if symptoms worsen or fail to improve, for INR.     LILLETTE Kelly ONEIDA Emilio, FNP, have reviewed all documentation for this visit. The documentation on 02/14/23 for the exam, diagnosis, procedures, and orders are all accurate and complete.  Kelly ONEIDA Emilio, FNP  [facility] [phone] (phone) [phone] (fax)  [facility]

## 2023-02-14 NOTE — Assessment & Plan Note (Signed)
 Chronic, borderline repeat INR per pt request; last at 2.4 Wishes to re-establish on coumadin clinic days

## 2023-02-15 LAB — TSH: TSH: 4.83 u[IU]/mL — ABNORMAL HIGH (ref 0.450–4.500)

## 2023-02-15 LAB — COMPREHENSIVE METABOLIC PANEL
ALT: 21 [IU]/L (ref 0–44)
AST: 24 [IU]/L (ref 0–40)
Albumin: 4.3 g/dL (ref 3.8–4.8)
Alkaline Phosphatase: 87 [IU]/L (ref 44–121)
BUN/Creatinine Ratio: 9 — ABNORMAL LOW (ref 10–24)
BUN: 11 mg/dL (ref 8–27)
Bilirubin Total: 0.7 mg/dL (ref 0.0–1.2)
CO2: 20 mmol/L (ref 20–29)
Calcium: 9.9 mg/dL (ref 8.6–10.2)
Chloride: 108 mmol/L — ABNORMAL HIGH (ref 96–106)
Creatinine, Ser: 1.2 mg/dL (ref 0.76–1.27)
Globulin, Total: 3 g/dL (ref 1.5–4.5)
Glucose: 116 mg/dL — ABNORMAL HIGH (ref 70–99)
Potassium: 4.7 mmol/L (ref 3.5–5.2)
Sodium: 143 mmol/L (ref 134–144)
Total Protein: 7.3 g/dL (ref 6.0–8.5)
eGFR: 62 mL/min/{1.73_m2} (ref >59)

## 2023-02-15 LAB — CBC WITH DIFFERENTIAL/PLATELET
Basophils Absolute: 0.1 10*3/uL (ref 0.0–0.2)
Basos: 1 %
EOS (ABSOLUTE): 0.2 10*3/uL (ref 0.0–0.4)
Eos: 3 %
Hematocrit: 45.6 % (ref 37.5–51.0)
Hemoglobin: 14.9 g/dL (ref 13.0–17.7)
Immature Grans (Abs): 0 10*3/uL (ref 0.0–0.1)
Immature Granulocytes: 0 %
Lymphocytes Absolute: 1.7 10*3/uL (ref 0.7–3.1)
Lymphs: 22 %
MCH: 29.5 pg (ref 26.6–33.0)
MCHC: 32.7 g/dL (ref 31.5–35.7)
MCV: 90 fL (ref 79–97)
Monocytes Absolute: 0.7 10*3/uL (ref 0.1–0.9)
Monocytes: 9 %
Neutrophils Absolute: 5 10*3/uL (ref 1.4–7.0)
Neutrophils: 65 %
Platelets: 310 10*3/uL (ref 150–450)
RBC: 5.05 x10E6/uL (ref 4.14–5.80)
RDW: 13.7 % (ref 11.6–15.4)
WBC: 7.7 10*3/uL (ref 3.4–10.8)

## 2023-02-15 LAB — HEMOGLOBIN A1C
Est. average glucose Bld gHb Est-mCnc: 123 mg/dL
Hgb A1c MFr Bld: 5.9 % — ABNORMAL HIGH (ref 4.8–5.6)

## 2023-02-15 LAB — LIPID PANEL
Chol/HDL Ratio: 4.4 {ratio} (ref 0.0–5.0)
Cholesterol, Total: 132 mg/dL (ref 100–199)
HDL: 30 mg/dL — ABNORMAL LOW (ref >39)
LDL Chol Calc (NIH): 71 mg/dL (ref 0–99)
Triglycerides: 183 mg/dL — ABNORMAL HIGH (ref 0–149)
VLDL Cholesterol Cal: 31 mg/dL (ref 5–40)

## 2023-02-15 LAB — PSA: Prostate Specific Ag, Serum: 8.1 ng/mL — ABNORMAL HIGH (ref 0.0–4.0)

## 2023-02-15 LAB — PROTIME-INR
INR: 2.4 — ABNORMAL HIGH (ref 0.9–1.2)
Prothrombin Time: 25.1 s — ABNORMAL HIGH (ref 9.1–12.0)

## 2023-02-15 LAB — VITAMIN D 25 HYDROXY (VIT D DEFICIENCY, FRACTURES): Vit D, 25-Hydroxy: 26.5 ng/mL — ABNORMAL LOW (ref 30.0–100.0)

## 2023-02-15 NOTE — Progress Notes (Signed)
 INR remains stable at 2.4.  A1c is continued in pre-diabetic range; Continue to recommend balanced, lower carb meals. Smaller meal size, adding snacks. Choosing water as drink of choice and increasing purposeful exercise.  LDL/bad cholesterol is at goal. Trigs/fat, given non fasting labs, are normal.  Creatinine is at baseline; cell count stabilized  TSH remains borderline subclinical range  PSA remains elevated; improved from labs 4 months ago.  Recommend start of 2000-5000 IU Vit D3 daily with meal; available OTC.

## 2023-03-06 ENCOUNTER — Ambulatory Visit: Payer: Medicare Other

## 2023-03-06 DIAGNOSIS — I739 Peripheral vascular disease, unspecified: Secondary | ICD-10-CM

## 2023-03-06 LAB — POCT INR
INR: 2.5 (ref 2.0–3.0)
PT: 30.2

## 2023-03-06 NOTE — Patient Instructions (Signed)
Description   Continue 7.5 mg daily.  Appointment in 3 weeks

## 2023-03-17 ENCOUNTER — Other Ambulatory Visit: Payer: Self-pay | Admitting: Cardiovascular Disease

## 2023-03-17 DIAGNOSIS — Z89429 Acquired absence of other toe(s), unspecified side: Secondary | ICD-10-CM

## 2023-03-17 DIAGNOSIS — E785 Hyperlipidemia, unspecified: Secondary | ICD-10-CM

## 2023-03-17 DIAGNOSIS — Z9889 Other specified postprocedural states: Secondary | ICD-10-CM

## 2023-03-19 ENCOUNTER — Ambulatory Visit: Payer: Medicare Other

## 2023-03-19 DIAGNOSIS — Z0000 Encounter for general adult medical examination without abnormal findings: Secondary | ICD-10-CM | POA: Diagnosis not present

## 2023-03-19 NOTE — Progress Notes (Signed)
 Subjective:   [name] [name] is a 78 y.o. male who presents for Medicare Annual/Subsequent preventive examination.  Visit Complete: Virtual I connected with  [name] [name] on 03/19/23 by a audio enabled telemedicine application and verified that I am speaking with the correct person using two identifiers.  This patient declined Interactive audio and acupuncturist. Therefore the visit was completed with audio only.   Patient Location: Home  Provider Location: Office/Clinic  I discussed the limitations of evaluation and management by telemedicine. The patient expressed understanding and agreed to proceed.  Vital Signs: Because this visit was a virtual/telehealth visit, some criteria may be missing or patient reported. Any vitals not documented were not able to be obtained and vitals that have been documented are patient reported.  Cardiac Risk Factors include: advanced age (>26men, >61 women);dyslipidemia;hypertension;male gender     Objective:    There were no vitals filed for this visit. There is no height or weight on file to calculate BMI.     03/19/2023    2:36 PM 12/12/2022    9:22 AM 05/22/2022    6:33 AM 02/10/2021    5:09 AM 02/08/2020    1:26 PM 02/02/2019    1:21 PM 11/03/2018    2:24 PM  Advanced Directives  Does Patient Have a Medical Advance Directive? No Yes No No No No No  Type of Furniture Conservator/restorer;Living will       Does patient want to make changes to medical advance directive?  No - Patient declined       Copy of Healthcare Power of Attorney in Chart?  No - copy requested       Would patient like information on creating a medical advance directive? No - Patient declined  No - Patient declined No - Patient declined No - Patient declined No - Patient declined No - Patient declined    Current Medications (verified) Outpatient Encounter Medications as of 03/19/2023  Medication Sig   Ascorbic Acid (VITAMIN C) 1000 MG  tablet Take 500 mg by mouth daily.   aspirin  EC 81 MG tablet Take 1 tablet (81 mg total) by mouth daily. Swallow whole.   atorvastatin  (LIPITOR) 40 MG tablet Take 1 tablet (40 mg total) by mouth daily.   Coenzyme Q10 (CO Q-10) 100 MG CAPS Take 100 capsules by mouth daily.   fenofibrate  54 MG tablet Take 1 tablet (54 mg total) by mouth daily.   metoprolol  succinate (TOPROL -XL) 25 MG 24 hr tablet Take 1 tablet (25 mg total) by mouth daily.   Multiple Vitamin (MULTIVITAMIN WITH MINERALS) TABS tablet Take 1 tablet by mouth daily.   Omega-3 Fatty Acids (FISH OIL  TRIPLE STRENGTH) 1400 MG CAPS Take 2,800 mg by mouth 2 (two) times daily.   Saw Palmetto  450 MG CAPS Take 2 capsules (900 mg total) by mouth 2 (two) times daily.   warfarin (COUMADIN ) 5 MG tablet Take 1.5 tablets (7.5 mg total) by mouth daily at 4 PM.   Zinc Acetate 25 MG CAPS Take 25 mg by mouth daily.   No facility-administered encounter medications on file as of 03/19/2023.    Allergies (verified) Omnipaque  [iohexol ], Oxytetracycline, and Alteplase    History: Past Medical History:  Diagnosis Date   Abdominal aortic aneurysm (HCC)    Cancer (HCC)    skin   Carotid artery occlusion    Chronic kidney disease    Right kidney stone   Coronary artery disease    Detached retina  Hyperlipidemia    Hypertension    Myocardial infarction [facility])    2007 heart stent   Popliteal aneurysm [facility])    Past Surgical History:  Procedure Laterality Date   ABDOMINAL AORTIC ANEURYSM REPAIR  11/24/2009   EVAR   ABDOMINAL AORTOGRAM W/LOWER EXTREMITY N/A 10/22/2017   Procedure: ABDOMINAL AORTOGRAM W/LOWER EXTREMITY;  Surgeon: Serene Gaile ORN, MD;  Location: [facility] INVASIVE CV LAB;  Service: Cardiovascular;  Laterality: N/A;   ABDOMINAL AORTOGRAM W/LOWER EXTREMITY N/A 05/22/2022   Procedure: ABDOMINAL AORTOGRAM W/LOWER EXTREMITY;  Surgeon: Serene Gaile ORN, MD;  Location: [facility] INVASIVE CV LAB;  Service: Cardiovascular;  Laterality: N/A;   AMPUTATION  Left 12/10/2013   Procedure: AMPUTATION LEFT  FOURTH AND FIFTH TOES;  Surgeon: Gaile ORN Serene, MD;  Location: [facility] OR;  Service: Vascular;  Laterality: Left;   CAROTID ENDARTERECTOMY     CAROTID ENDARTERECTOMY Right 12/14/2022   COLONOSCOPY     coronary stenting     2007   ENDARTERECTOMY Right 12/14/2022   Procedure: ENDARTERECTOMY RIGHT CAROTID;  Surgeon: Serene Gaile ORN, MD;  Location: [facility] OR;  Service: Vascular;  Laterality: Right;   EYE SURGERY Right    cataract removed   FEMORAL-POPLITEAL BYPASS GRAFT Left 10/29/2013   Procedure: BYPASS GRAFT FEMORAL-POPLITEAL ARTERY;  Surgeon: Gaile ORN Serene, MD;  Location: [facility] OR;  Service: Vascular;  Laterality: Left;  Left Femoral to Posterior Tibial artery bypass graft using nonreversed saphenous vein. and thrombectomy of Tibial arterly.   FEMORAL-TIBIAL BYPASS GRAFT Left 10/30/2013   Procedure: Reexploration of Femoral-Posterior Tibial Bypass; Thrombectomy of Bypass; Left Leg Angiogram; Posterior Tibial Vein Patch Angioplasty;  Surgeon: Gaile ORN Serene, MD;  Location[facility];  Service: Vascular;  Laterality: Left;   LOWER EXTREMITY ANGIOGRAM N/A 05/25/2014   Procedure: LOWER EXTREMITY ANGIOGRAM;  Surgeon: Gaile ORN Serene, MD;  Location: [facility] CATH LAB;  Service: Cardiovascular;  Laterality: N/A;   PATCH ANGIOPLASTY Right 12/14/2022   Procedure: PATCH ANGIOPLASTY USING XENOSURE 1CM X 6CM PATCH;  Surgeon: Serene Gaile ORN, MD;  Location: [facility] OR;  Service: Vascular;  Laterality: Right;   PERIPHERAL VASCULAR BALLOON ANGIOPLASTY Left 10/22/2017   Procedure: PERIPHERAL VASCULAR BALLOON ANGIOPLASTY;  Surgeon: Serene Gaile ORN, MD;  [facility]: [facility];  Service: Cardiovascular;  Laterality: Left;  fem pop bypass   PERIPHERAL VASCULAR CATHETERIZATION Left 10/04/2015   Procedure: Lower Extremity Angiography;  Surgeon: Gaile ORN Serene, MD;  Location: [facility] INVASIVE CV LAB;  Service: Cardiovascular;  Laterality: Left;   PERIPHERAL VASCULAR CATHETERIZATION Left  10/04/2015   Procedure: Peripheral Vascular Intervention;  Surgeon: Gaile ORN Serene, MD;  Location: [facility] INVAS[facility] CV LAB;  Service: Cardiovascular;  Laterality: Left;   POPLITEAL ARTERY STENT  11/12/2009   left popliteal artery    RETINAL DETACHMENT SURGERY Right 02/12/2009   TOOTH EXTRACTION  07/13/2013   Wisdom Tooth Extraction   Family History  Problem Relation Age of Onset   Aneurysm Sister        possibly    Peripheral vascular disease Brother    Social History   Socioeconomic History   Marital status: Married    Spouse name: Not on file   Number of children: 1   Years of education: Not on file   Highest education level: Associate degree: occupational, scientist, product/process development, or vocational program  Occupational History   Occupation: Retired  Tobacco Use   Smoking status: Former    Current packs/day: 0.00    Types: Cigarettes    Start date: 09/22/1970  Quit date: 09/21/1985    Years since quitting: 37.5   Smokeless tobacco: Never  Vaping Use   Vaping status: Never Used  Substance and Sexual Activity   Alcohol use: Not Currently    Comment: 1 beer ocassionally (mostly in summer)   Drug use: No   Sexual activity: Not on file  Other Topics Concern   Not on file  Social History Narrative   Lives with family.   Social Drivers of Corporate Investment Banker Strain: Low Risk  (03/19/2023)   Overall Financial Resource Strain (CARDIA)    Difficulty of Paying Living Expenses: Not hard at all  Food Insecurity: No Food Insecurity (03/19/2023)   Hunger Vital Sign    Worried About Running Out of Food in the Last Year: Never true    Ran Out of Food in the Last Year: Never true  Transportation Needs: No Transportation Needs (03/19/2023)   PRAPARE - Administrator, Civil Service (Medical): No    Lack of Transportation (Non-Medical): No  Physical Activity: Sufficiently Active (03/19/2023)   Exercise Vital Sign    Days of Exercise per Week: 7 days    Minutes of Exercise per Session:  30 min  Stress: No Stress Concern Present (03/19/2023)   [employer] of Occupational Health - Occupational Stress Questionnaire    Feeling of Stress : Not at all  Social Connections: Unknown (03/19/2023)   Social Connection and Isolation Panel [NHANES]    Frequency of Communication with Friends and Family: Once a week    Frequency of Social Gatherings with Friends and Family: Not on file    Attends Religious Services: 1 to 4 times per year    Active Member of [employer] or Organizations: No    Attends Engineer, Structural: Never    Marital Status: Married    Tobacco Counseling Counseling given: Not Answered   Clinical Intake:  Pre-visit preparation completed: Yes  Pain : No/denies pain     BMI - recorded: 26.3 Nutritional Status: BMI 25 -29 Overweight Nutritional Risks: None Diabetes: No  How often do you need to have someone help you when you read instructions, pamphlets, or other written materials from your doctor or pharmacy?: 1 - Never  Interpreter Needed?: No  Information entered by :: JHONNIE DAS, LPN   Activities of Daily Living    03/19/2023    2:37 PM 12/12/2022    9:24 AM  In your present state of health, do you have any difficulty performing the following activities:  Hearing? 0   Vision? 0   Difficulty concentrating or making decisions? 0   Walking or climbing stairs? 0   Dressing or bathing? 0   Doing errands, shopping? 0 0  Preparing Food and eating ? N   Using the Toilet? N   In the past six months, have you accidently leaked urine? N   Do you have problems with loss of bowel control? N   Managing your Medications? N   Managing your Finances? N   Housekeeping or managing your Housekeeping? N     Patient Care Team: Donzella Lauraine SAILOR, DO as PCP - General (Family Medicine) Wonda Sharper, MD as PCP - Cardiology (Cardiology) Serene Gaile ORN, MD as Consulting Physician (Vascular Surgery) [facility], [facility]  Indicate any recent  Medical Services you may have received from other than [facility] providers in the past year (date may be approximate).     Assessment:   This is a routine  wellness examination for [facility].  Hearing/Vision screen Hearing Screening - Comments:: NO AIDS Vision Screening - Comments:: WEARS GLASSES ALL THE TIME- [facility]   Goals Addressed             This Visit's Progress    DIET - EAT MORE FRUITS AND VEGETABLES         Depression Screen    03/19/2023    2:34 PM 06/07/2022    2:34 PM 03/02/2022   10:11 AM 12/21/2021    1:35 PM 03/01/2021   10:34 AM 02/08/2020    1:24 PM 02/02/2019    1:22 PM  PHQ 2/9 Scores  PHQ - 2 Score 0 0 0 0 0 0 0  PHQ- 9 Score 0 0 0 1 2      Fall Risk    03/19/2023    2:37 PM 06/07/2022    2:34 PM 03/02/2022   10:11 AM 12/21/2021    1:35 PM 09/06/2020   11:20 AM  Fall Risk   Falls in the past year? 0 0 0 0   Number falls in past yr: 0 0 0 0 0  Injury with Fall? 0 0 0 0 0  Risk for fall due to : No Fall Risks No Fall Risks  No Fall Risks No Fall Risks  Follow up Falls prevention discussed;Falls evaluation completed   Falls evaluation completed     MEDICARE RISK AT HOME: Medicare Risk at Home Any stairs in or around the home?: Yes If so, are there any without handrails?: No Home free of loose throw rugs in walkways, pet beds, electrical cords, etc?: Yes Adequate lighting in your home to reduce risk of falls?: Yes Life alert?: No Use of a cane, walker or w/c?: No Grab bars in the bathroom?: Yes Shower chair or bench in shower?: No Elevated toilet seat or a handicapped toilet?: No  TIMED UP AND GO:  Was the test performed?  No    Cognitive Function:        03/19/2023    2:38 PM 02/02/2019    1:26 PM 01/28/2018    9:54 AM  6CIT Screen  What Year? 0 points 0 points 0 points  What month? 0 points 0 points 0 points  What time? 0 points 0 points 0 points  Count back from 20 0 points 0 points 0 points  Months in reverse 2 points 0 points 0  points  Repeat phrase 4 points 4 points 2 points  Total Score 6 points 4 points 2 points    Immunizations Immunization History  Administered Date(s) Administered   Fluad Quad(high Dose 65+) 02/03/2019, 02/10/2020   Influenza, High Dose Seasonal PF 01/14/2015, 11/25/2015, 11/30/2016, 12/06/2017   Moderna Sars-Covid-2 Vaccination 03/28/2019, 04/25/2019   Pneumococcal Conjugate-13 01/25/2017   Pneumococcal Polysaccharide-23 01/28/2018   Tdap 12/17/2013    TDAP status: Up to date  Flu Vaccine status: Declined, Education has been provided regarding the importance of this vaccine but patient still declined. Advised may receive this vaccine at local pharmacy or Health Dept. Aware to provide a copy of the vaccination record if obtained from local pharmacy or Health Dept. Verbalized acceptance and understanding.  Pneumococcal vaccine status: Declined,  Education has been provided regarding the importance of this vaccine but patient still declined. Advised may receive this vaccine at local pharmacy or Health Dept. Aware to provide a copy of the vaccination record if obtained from local pharmacy or Health Dept. Verbalized acceptance and understanding.   Covid-19 vaccine status:  Completed vaccines  Qualifies for Shingles Vaccine? Yes   Zostavax completed No   Shingrix Completed?: No.    Education has been provided regarding the importance of this vaccine. Patient has been advised to call insurance company to determine out of pocket expense if they have not yet received this vaccine. Advised may also receive vaccine at local pharmacy or Health Dept. Verbalized acceptance and understanding.  Screening Tests Health Maintenance  Topic Date Due   Zoster Vaccines- Shingrix (1 of 2) Never done   COVID-19 Vaccine (3 - Moderna risk series) 05/23/2019   INFLUENZA VACCINE  05/13/2023 (Originally 09/13/2022)   HEMOGLOBIN A1C  08/14/2023   DTaP/Tdap/Td (2 - Td or Tdap) 12/18/2023   Medicare Annual Wellness  (AWV)  03/18/2024   Pneumonia Vaccine 16+ Years old  Completed   Hepatitis C Screening  Completed   HPV VACCINES  Aged Out   Fecal DNA (Cologuard)  Discontinued    Health Maintenance  Health Maintenance Due  Topic Date Due   Zoster Vaccines- Shingrix (1 of 2) Never done   COVID-19 Vaccine (3 - Moderna risk series) 05/23/2019    Colorectal cancer screening: No longer required.   Lung Cancer Screening: (Low Dose CT Chest recommended if Age 36-80 years, 20 pack-year currently smoking OR have quit w/in 15years.) does not qualify.    Additional Screening:  Hepatitis C Screening: does qualify; Completed 01/25/17  Vision Screening: Recommended annual ophthalmology exams for early detection of glaucoma and other disorders of the eye. Is the patient up to date with their annual eye exam?  Yes  Who is the provider or what is the name of the office in which the patient attends annual eye exams? [facility] If pt is not established with a provider, would they like to be referred to a provider to establish care? No .   Dental Screening: Recommended annual dental exams for proper oral hygiene   Community Resource Referral / Chronic Care Management: CRR required this visit?  No   CCM required this visit?  No     Plan:     I have personally reviewed and noted the following in the patient's chart:   Medical and social history Use of alcohol, tobacco or illicit drugs  Current medications and supplements including opioid prescriptions. Patient is not currently taking opioid prescriptions. Functional ability and status Nutritional status Physical activity Advanced directives List of other physicians Hospitalizations, surgeries, and ER visits in previous 12 months Vitals Screenings to include cognitive, depression, and falls Referrals and appointments  In addition, I have reviewed and discussed with patient certain preventive protocols, quality metrics, and best practice  recommendations. A written personalized care plan for preventive services as well as general preventive health recommendations were provided to patient.     Jhonnie GORMAN Das, LPN   08/16/7972   After Visit Summary: (MyChart) Due to this being a telephonic visit, the after visit summary with patients personalized plan was offered to patient via MyChart   Nurse Notes: NONE

## 2023-03-19 NOTE — Patient Instructions (Addendum)
 [name] [name] , Thank you for taking time to come for your Medicare Wellness Visit. I appreciate your ongoing commitment to your health goals. Please review the following plan we discussed and let me know if I can assist you in the future.   Referrals/Orders/Follow-Ups/Clinician Recommendations: NONE  This is a list of the screening recommended for you and due dates:  Health Maintenance  Topic Date Due   Zoster (Shingles) Vaccine (1 of 2) Never done   COVID-19 Vaccine (3 - Moderna risk series) 05/23/2019   Flu Shot  05/13/2023*   Hemoglobin A1C  08/14/2023   DTaP/Tdap/Td vaccine (2 - Td or Tdap) 12/18/2023   Medicare Annual Wellness Visit  03/18/2024   Pneumonia Vaccine  Completed   Hepatitis C Screening  Completed   HPV Vaccine  Aged Out   Cologuard (Stool DNA test)  Discontinued  *Topic was postponed. The date shown is not the original due date.    Advanced directives: (ACP Link)Information on Advanced Care Planning can be found at [place]  Secretary of [facility] Advance Health Care Directives Advance Health Care Directives ([email or web address]   Next Medicare Annual Wellness Visit scheduled for next year: Yes   03/24/24 @ 3:10 PM BY PHONE

## 2023-03-21 ENCOUNTER — Encounter: Payer: Self-pay | Admitting: *Deleted

## 2023-03-25 ENCOUNTER — Other Ambulatory Visit: Payer: Self-pay

## 2023-03-25 ENCOUNTER — Encounter: Payer: Self-pay | Admitting: Cardiovascular Disease

## 2023-03-25 ENCOUNTER — Ambulatory Visit: Payer: Medicare Other | Attending: Cardiovascular Disease | Admitting: Cardiovascular Disease

## 2023-03-25 VITALS — BP 120/70 | HR 67 | Ht 74.0 in | Wt 207.6 lb

## 2023-03-25 DIAGNOSIS — I10 Essential (primary) hypertension: Secondary | ICD-10-CM | POA: Diagnosis not present

## 2023-03-25 DIAGNOSIS — I739 Peripheral vascular disease, unspecified: Secondary | ICD-10-CM

## 2023-03-25 DIAGNOSIS — I2510 Atherosclerotic heart disease of native coronary artery without angina pectoris: Secondary | ICD-10-CM

## 2023-03-25 DIAGNOSIS — E782 Mixed hyperlipidemia: Secondary | ICD-10-CM

## 2023-03-25 DIAGNOSIS — R972 Elevated prostate specific antigen [PSA]: Secondary | ICD-10-CM

## 2023-03-25 NOTE — Patient Instructions (Signed)
 Follow-Up: At [facility], you and your health needs are our priority.  As part of our continuing mission to provide you with exceptional heart care, we have created designated Provider Care Teams.  These Care Teams include your primary Cardiologist (physician) and Advanced Practice Providers (APPs -  Physician Assistants and Nurse Practitioners) who all work together to provide you with the care you need, when you need it.  We recommend signing up for the patient portal called "MyChart".  Sign up information is provided on this After Visit Summary.  MyChart is used to connect with patients for Virtual Visits (Telemedicine).  Patients are able to view lab/test results, encounter notes, upcoming appointments, etc.  Non-urgent messages can be sent to your provider as well.   To learn more about what you can do with MyChart, go to [email or web address].    Your next appointment:   1 year(s)  Provider:   Tonny Bollman, MD     Other Instructions   1st Floor: - Lobby - Registration  - Pharmacy  - Lab - Cafe  2nd Floor: - PV Lab - Diagnostic Testing (echo, CT, nuclear med)  3rd Floor: - Vacant  4th Floor: - TCTS (cardiothoracic surgery) - AFib Clinic - Structural Heart Clinic - Vascular Surgery  - Vascular Ultrasound  5th Floor: - HeartCare Cardiology (general and EP) - Clinical Pharmacy for coumadin, hypertension, lipid, weight-loss medications, and med management appointments    Valet parking services will be available as well.

## 2023-03-25 NOTE — Assessment & Plan Note (Signed)
 Blood pressure is well-controlled and he will continue on metoprolol  succinate

## 2023-03-25 NOTE — Assessment & Plan Note (Signed)
 The patient remains clean [place] stable.  He has no symptoms of angina.  He is managed with aspirin  for antiplatelet therapy, high intensity statin drug with atorvastatin  40 mg, fenofibrate , and metoprolol .  His EKG is unchanged and compared to his previous EKG tracings.

## 2023-03-25 NOTE — Assessment & Plan Note (Signed)
 Treated with atorvastatin  40 mg daily.  LDL cholesterol 71.

## 2023-03-25 NOTE — Progress Notes (Signed)
 Cardiology Office Note:    Date:  03/25/2023   ID:  [name] [name], DOB 05-08-1945, MRN [id number]  PCP:  Carlean Charter, DO   [facility] Providers Cardiologist:  Arnoldo Lapping, MD     Referring MD: Normie Becton, FNP   Chief Complaint  Patient presents with   Coronary Artery Disease    History of Present Illness:    [name] [name] is a 78 y.o. male with a hx of:  Coronary artery disease  S/p inferior STEMI in 2007 >>PCI: BMS to RCA ([facility]) No significant dz in LCx, LAD TTE 10/26/13: Moderate LVH, EF 55-60, no RWMA, GR 1 DD  Abdominal aortic aneurysm, L pop aneurysm - s/p EVAR in 2011 Peripheral arterial disease (followed by Dr. Charlotte Cookey w [facility]) On chronic anticoagulation w warfarin - INR's followed at [facility] Hx of popliteal artery stent in 2011 S/p amputation of L 4&5 toes S/p L fem-pop bypass in 2015 S/p angioplasty of graft in 9/19 Hypertension  Hyperlipidemia  Hx of retinal detachment  Right Carotid endarterectomy 2024  The patient is here with his wife today.  He has been doing well from a cardiac perspective. Today, he denies symptoms of palpitations, chest pain, shortness of breath, orthopnea, PND, lower extremity edema, dizziness, or syncope.  Since seeing him last, he has undergone a lower extremity endovascular procedure and a right carotid endarterectomy.  States that he did well with both of his interventions.  Current Medications: Current Meds  Medication Sig   Ascorbic Acid (VITAMIN C) 1000 MG tablet Take 500 mg by mouth daily.   aspirin  EC 81 MG tablet Take 1 tablet (81 mg total) by mouth daily. Swallow whole.   atorvastatin  (LIPITOR) 40 MG tablet TAKE 1 TABLET BY MOUTH EVERY DAY   Cholecalciferol (VITAMIN D3) 50 MCG (2000 UT) capsule Take 2,000 Units by mouth daily.   Coenzyme Q10 (CO Q-10) 100 MG CAPS Take 100 capsules by mouth daily.   fenofibrate  54 MG tablet Take 1 tablet (54 mg total) by mouth daily.    metoprolol  succinate (TOPROL -XL) 25 MG 24 hr tablet Take 1 tablet (25 mg total) by mouth daily.   Multiple Vitamin (MULTIVITAMIN WITH MINERALS) TABS tablet Take 1 tablet by mouth daily.   Omega-3 Fatty Acids (FISH OIL  TRIPLE STRENGTH) 1400 MG CAPS Take 2,800 mg by mouth 2 (two) times daily.   Saw Palmetto  450 MG CAPS Take 2 capsules (900 mg total) by mouth 2 (two) times daily.   warfarin (COUMADIN ) 5 MG tablet Take 1.5 tablets (7.5 mg total) by mouth daily at 4 PM.   Zinc Acetate 25 MG CAPS Take 25 mg by mouth daily.     Allergies:   Omnipaque  [iohexol ], Oxytetracycline, and Alteplase    ROS:   Please see the history of present illness.    All other systems reviewed and are negative.  EKGs/Labs/Other Studies Reviewed:    The following studies were reviewed today:     EKG:   EKG Interpretation Date/Time:  Monday March 25 2023 09:31:18 EST Ventricular Rate:  67 PR Interval:  144 QRS Duration:  98 QT Interval:  366 QTC Calculation: 386 R Axis:   -43  Text Interpretation: Normal sinus rhythm Left axis deviation Inferior infarct (cited on or before 20-Sep-2005) When compared with ECG of 10-Feb-2021 05:16, Minimal criteria for Anterior infarct are no longer Present Confirmed by Arnoldo Lapping [phone]) on 03/25/2023 9:38:10 AM    Recent Labs: 02/14/2023: ALT 21; BUN 11;  Creatinine, Ser 1.20; Hemoglobin 14.9; Platelets 310; Potassium 4.7; Sodium 143; TSH 4.830  Recent Lipid Panel    Component Value Date/Time   CHOL 132 02/14/2023 1333   TRIG 183 (H) 02/14/2023 1333   HDL 30 (L) 02/14/2023 1333   CHOLHDL 4.4 02/14/2023 1333   CHOLHDL 4.6 12/15/2022 0302   VLDL 17 12/15/2022 0302   LDLCALC 71 02/14/2023 1333   LDLCALC 73 01/25/2017 1129     Risk Assessment/Calculations:                Physical Exam:    VS:  BP 120/70   Pulse 67   Ht 6\' 2"  (1.88 m)   Wt 207 lb 9.6 oz (94.2 kg)   SpO2 98%   BMI 26.65 kg/m     Wt Readings from Last 3 Encounters:  03/25/23 207 lb 9.6  oz (94.2 kg)  02/14/23 205 lb (93 kg)  12/31/22 202 lb 12.8 oz (92 kg)     GEN:  Well nourished, well developed in no acute distress HEENT: Normal NECK: No JVD; No carotid bruits, right carotid endarterectomy scar is well-healed LYMPHATICS: No lymphadenopathy CARDIAC: RRR, 2/6 systolic murmur at the left sternal border RESPIRATORY:  Clear to auscultation without rales, wheezing or rhonchi  ABDOMEN: Soft, non-tender, non-distended MUSCULOSKELETAL:  No edema; No deformity  SKIN: Warm and dry NEUROLOGIC:  Alert and oriented x 3 PSYCHIATRIC:  Normal affect   Assessment & Plan Coronary artery disease involving native coronary artery of native heart without angina pectoris The patient remains clean [place] stable.  He has no symptoms of angina.  He is managed with aspirin  for antiplatelet therapy, high intensity statin drug with atorvastatin  40 mg, fenofibrate , and metoprolol .  His EKG is unchanged and compared to his previous EKG tracings. PAD (peripheral artery disease) (HCC) Patient followed closely by vascular surgery.  He has undergone carotid endarterectomy for severe asymptomatic carotid stenosis.  I reviewed those surgical notes today.  He had an uncomplicated surgery.  He reports no new symptoms of claudication and he is followed with serial ultrasound studies for his left lower extremity PAD.  He is managed with warfarin. Essential hypertension Blood pressure is well-controlled and he will continue on metoprolol  succinate Mixed hyperlipidemia Treated with atorvastatin  40 mg daily.  LDL cholesterol 71.       Medication Adjustments/Labs and Tests Ordered: Current medicines are reviewed at length with the patient today.  Concerns regarding medicines are outlined above.  Orders Placed This Encounter  Procedures   EKG 12-Lead   No orders of the defined types were placed in this encounter.   Patient Instructions  Follow-Up: At [facility], you and your health needs are  our priority.  As part of our continuing mission to provide you with exceptional heart care, we have created designated Provider Care Teams.  These Care Teams include your primary Cardiologist (physician) and Advanced Practice Providers (APPs -  Physician Assistants and Nurse Practitioners) who all work together to provide you with the care you need, when you need it.  We recommend signing up for the patient portal called "MyChart".  Sign up information is provided on this After Visit Summary.  MyChart is used to connect with patients for Virtual Visits (Telemedicine).  Patients are able to view lab/test results, encounter notes, upcoming appointments, etc.  Non-urgent messages can be sent to your provider as well.   To learn more about what you can do with MyChart, go to [email or web address].    Your  next appointment:   1 year(s)  Provider:   Arnoldo Lapping, MD     Other Instructions   1st Floor: - Lobby - Registration  - Pharmacy  - Lab - Cafe  2nd Floor: - PV Lab - Diagnostic Testing (echo, CT, nuclear med)  3rd Floor: - Vacant  4th Floor: - TCTS (cardiothoracic surgery) - AFib Clinic - Structural Heart Clinic - Vascular Surgery  - Vascular Ultrasound  5th Floor: - HeartCare Cardiology (general and EP) - Clinical Pharmacy for coumadin , hypertension, lipid, weight-loss medications, and med management appointments    Valet parking services will be available as well.        Signed, Arnoldo Lapping, MD  03/25/2023 1:36 PM    [facility]

## 2023-03-25 NOTE — Assessment & Plan Note (Signed)
 Patient followed closely by vascular surgery.  He has undergone carotid endarterectomy for severe asymptomatic carotid stenosis.  I reviewed those surgical notes today.  He had an uncomplicated surgery.  He reports no new symptoms of claudication and he is followed with serial ultrasound studies for his left lower extremity PAD.  He is managed with warfarin.

## 2023-03-27 ENCOUNTER — Ambulatory Visit: Payer: Medicare Other

## 2023-03-27 DIAGNOSIS — I739 Peripheral vascular disease, unspecified: Secondary | ICD-10-CM

## 2023-03-27 LAB — POCT INR
INR: 2.6 (ref 2.0–3.0)
POC INR: 2.6
PT: 31.6

## 2023-03-27 NOTE — Patient Instructions (Signed)
Description   Continue 7.5 mg daily.  Appointment in 4 weeks

## 2023-03-29 ENCOUNTER — Other Ambulatory Visit: Payer: Self-pay

## 2023-04-05 ENCOUNTER — Other Ambulatory Visit: Payer: Self-pay | Admitting: Cardiovascular Disease

## 2023-04-05 ENCOUNTER — Other Ambulatory Visit: Payer: Self-pay | Admitting: Physician Assistant

## 2023-04-05 DIAGNOSIS — D6869 Other thrombophilia: Secondary | ICD-10-CM

## 2023-04-05 DIAGNOSIS — E785 Hyperlipidemia, unspecified: Secondary | ICD-10-CM

## 2023-04-05 DIAGNOSIS — E782 Mixed hyperlipidemia: Secondary | ICD-10-CM

## 2023-04-05 DIAGNOSIS — I10 Essential (primary) hypertension: Secondary | ICD-10-CM

## 2023-04-14 ENCOUNTER — Other Ambulatory Visit: Payer: Self-pay | Admitting: Cardiovascular Disease

## 2023-04-14 DIAGNOSIS — Z9889 Other specified postprocedural states: Secondary | ICD-10-CM

## 2023-04-14 DIAGNOSIS — E785 Hyperlipidemia, unspecified: Secondary | ICD-10-CM

## 2023-04-14 DIAGNOSIS — Z89429 Acquired absence of other toe(s), unspecified side: Secondary | ICD-10-CM

## 2023-04-24 ENCOUNTER — Ambulatory Visit: Payer: Medicare Other

## 2023-04-24 DIAGNOSIS — I739 Peripheral vascular disease, unspecified: Secondary | ICD-10-CM

## 2023-04-24 LAB — POCT INR
INR: 2.7 (ref 2.0–3.0)
POC INR: 2.7
PT: 32.5

## 2023-04-24 NOTE — Patient Instructions (Signed)
 Description   Continue 7.5 mg daily.  Appointment in 4 weeks

## 2023-05-22 ENCOUNTER — Ambulatory Visit

## 2023-05-22 DIAGNOSIS — I739 Peripheral vascular disease, unspecified: Secondary | ICD-10-CM | POA: Diagnosis not present

## 2023-05-22 LAB — POCT INR
INR: 2.6 (ref 2.0–3.0)
POC INR: 2.6
PT: 30.7

## 2023-05-22 NOTE — Patient Instructions (Signed)
 Description   Continue 7.5 mg daily.  Appointment in 4 weeks

## 2023-05-22 NOTE — Progress Notes (Signed)
 PT/INR check done

## 2023-06-19 ENCOUNTER — Ambulatory Visit

## 2023-06-19 DIAGNOSIS — I739 Peripheral vascular disease, unspecified: Secondary | ICD-10-CM | POA: Diagnosis not present

## 2023-06-19 LAB — POCT INR
INR: 2.6 (ref 2.0–3.0)
PT: 30.8

## 2023-06-19 NOTE — Patient Instructions (Signed)
 Description   Continue 7.5 mg daily.  Appointment in 4 weeks

## 2023-07-17 ENCOUNTER — Ambulatory Visit

## 2023-07-24 ENCOUNTER — Ambulatory Visit

## 2023-07-24 DIAGNOSIS — I739 Peripheral vascular disease, unspecified: Secondary | ICD-10-CM | POA: Diagnosis not present

## 2023-07-24 LAB — POCT INR
INR: 3.4 — AB (ref 2.0–3.0)
PT: 40.2

## 2023-07-24 NOTE — Patient Instructions (Signed)
 Description   Continue 7.5 mg daily.  Appointment in 4 weeks

## 2023-08-21 ENCOUNTER — Ambulatory Visit

## 2023-08-21 DIAGNOSIS — I739 Peripheral vascular disease, unspecified: Secondary | ICD-10-CM

## 2023-08-21 LAB — POCT INR
INR: 2.4 (ref 2.0–3.0)
POC INR: 2.4
PT: 28.8

## 2023-08-21 NOTE — Progress Notes (Signed)
 Patient presented for PT/INR.  Level has dropped. Patient admits to eating more greens lately.  Will recheck level in 2 weeks.  Patient to establish with new provider

## 2023-08-21 NOTE — Patient Instructions (Signed)
 Description   Continue 7.5 mg daily.  Appointment in 2 weeks

## 2023-09-04 ENCOUNTER — Ambulatory Visit

## 2023-09-04 DIAGNOSIS — I739 Peripheral vascular disease, unspecified: Secondary | ICD-10-CM | POA: Diagnosis not present

## 2023-09-04 LAB — POCT INR: POC INR: 2.6

## 2023-09-04 NOTE — Patient Instructions (Signed)
 Description   Continue 7.5 mg daily.  Appointment in 2 weeks

## 2023-09-18 ENCOUNTER — Ambulatory Visit: Admitting: Family Medicine

## 2023-09-18 ENCOUNTER — Ambulatory Visit

## 2023-09-18 ENCOUNTER — Encounter: Payer: Self-pay | Admitting: Family Medicine

## 2023-09-18 VITALS — BP 128/70 | HR 57 | Ht 74.0 in | Wt 209.4 lb

## 2023-09-18 DIAGNOSIS — Z7901 Long term (current) use of anticoagulants: Secondary | ICD-10-CM | POA: Diagnosis not present

## 2023-09-18 DIAGNOSIS — I714 Abdominal aortic aneurysm, without rupture, unspecified: Secondary | ICD-10-CM | POA: Diagnosis not present

## 2023-09-18 DIAGNOSIS — I10 Essential (primary) hypertension: Secondary | ICD-10-CM

## 2023-09-18 DIAGNOSIS — D6869 Other thrombophilia: Secondary | ICD-10-CM

## 2023-09-18 DIAGNOSIS — Z9889 Other specified postprocedural states: Secondary | ICD-10-CM

## 2023-09-18 DIAGNOSIS — E782 Mixed hyperlipidemia: Secondary | ICD-10-CM

## 2023-09-18 DIAGNOSIS — N401 Enlarged prostate with lower urinary tract symptoms: Secondary | ICD-10-CM

## 2023-09-18 DIAGNOSIS — I739 Peripheral vascular disease, unspecified: Secondary | ICD-10-CM

## 2023-09-18 DIAGNOSIS — R7303 Prediabetes: Secondary | ICD-10-CM

## 2023-09-18 DIAGNOSIS — I2510 Atherosclerotic heart disease of native coronary artery without angina pectoris: Secondary | ICD-10-CM

## 2023-09-18 LAB — POCT INR: INR: 3.1 — AB (ref 2.0–3.0)

## 2023-09-18 NOTE — Assessment & Plan Note (Signed)
 Chronic, stable. Well-controlled. Continue metoprolol  succinate 25 mg daily.

## 2023-09-18 NOTE — Assessment & Plan Note (Addendum)
 Chronic, stable.  Continue warfarin with INR dosing range of 2.5-3.5 to assist BP and Lipids at goal Follows with vascular surgery. No acute concerns.  Continue to monitor. Defer to specialist management.
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
## 2023-09-18 NOTE — Assessment & Plan Note (Signed)
 Chronic, stable. Well-controlled. Continue atorvastatin  40 mg daily.

## 2023-09-18 NOTE — Assessment & Plan Note (Addendum)
 Chronic, stable. Follows with vascular surgery and cardiology. No acute concerns.  Continue to monitor. Continue metoprolol  succinate.  Defer to specialist management.

## 2023-09-18 NOTE — Assessment & Plan Note (Signed)
 Follows with vascular surgery. No acute concerns.  Continue to monitor. Defer to specialist management.

## 2023-09-19 ENCOUNTER — Other Ambulatory Visit: Payer: Self-pay

## 2023-09-19 DIAGNOSIS — I739 Peripheral vascular disease, unspecified: Secondary | ICD-10-CM

## 2023-09-19 DIAGNOSIS — I6523 Occlusion and stenosis of bilateral carotid arteries: Secondary | ICD-10-CM

## 2023-09-19 DIAGNOSIS — I714 Abdominal aortic aneurysm, without rupture, unspecified: Secondary | ICD-10-CM

## 2023-09-26 ENCOUNTER — Other Ambulatory Visit: Payer: Self-pay

## 2023-09-26 DIAGNOSIS — R972 Elevated prostate specific antigen [PSA]: Secondary | ICD-10-CM

## 2023-09-27 ENCOUNTER — Ambulatory Visit: Payer: Self-pay | Admitting: Urology

## 2023-09-27 ENCOUNTER — Encounter: Payer: Self-pay | Admitting: Urology

## 2023-09-27 VITALS — BP 130/73 | HR 55 | Ht 74.0 in | Wt 199.1 lb

## 2023-09-27 DIAGNOSIS — N401 Enlarged prostate with lower urinary tract symptoms: Secondary | ICD-10-CM

## 2023-09-27 DIAGNOSIS — R972 Elevated prostate specific antigen [PSA]: Secondary | ICD-10-CM

## 2023-09-27 LAB — PSA: Prostate Specific Ag, Serum: 8.6 ng/mL — ABNORMAL HIGH (ref 0.0–4.0)

## 2023-09-27 NOTE — Progress Notes (Signed)
 09/27/2023 9:00 AM   [name] [name] 24-Jul-1945 [id number]  Referring provider: Emilio Kelly DASEN, FNP No address on file  Chief Complaint  Patient presents with   Elevated PSA   Urologic History:   1. Elevated PSA PSA 10.14 December 2021. Prostate MRI 03/09/22 with a 210 cc prostate volume and a Pi-RADS 3 lesions left anterior transition zone; elected surveillance.  HPI: [name] [name] is a 78 y.o. male who presents for annual follow-up  No problems since last year's visit No bothersome LUTS Denies dysuria, gross hematuria Denies flank, abdominal or pelvic pain PSA 09/26/2023 stable at 8.6   PMH: Past Medical History:  Diagnosis Date   Abdominal aortic aneurysm (HCC)    Cancer (HCC)    skin   Carotid artery occlusion    Chronic kidney disease    Right kidney stone   Coronary artery disease    Detached retina    Hyperlipidemia    Hypertension    Myocardial infarction [facility])    2007 heart stent   Popliteal aneurysm [facility])     Surgical History: Past Surgical History:  Procedure Laterality Date   ABDOMINAL AORTIC ANEURYSM REPAIR  11/24/2009   EVAR   ABDOMINAL AORTOGRAM W/LOWER EXTREMITY N/A 10/22/2017   Procedure: ABDOMINAL AORTOGRAM W/LOWER EXTREMITY;  Surgeon: Serene Gaile ORN, MD;  Location: [facility] INVASIVE CV LAB;  Service: Cardiovascular;  Laterality: N/A;   ABDOMINAL AORTOGRAM W/LOWER EXTREMITY N/A 05/22/2022   Procedure: ABDOMINAL AORTOGRAM W/LOWER EXTREMITY;  Surgeon: Serene Gaile ORN, MD;  Location: [facility] INVASIVE CV LAB;  Service: Cardiovascular;  Laterality: N/A;   AMPUTATION Left 12/10/2013   Procedure: AMPUTATION LEFT  FOURTH AND FIFTH TOES;  Surgeon: Gaile ORN Serene, MD;  Location: [facility] OR;  Service: Vascular;  Laterality: Left;   CAROTID ENDARTERECTOMY     CAROTID ENDARTERECTOMY Right 12/14/2022   COLONOSCOPY     coronary stenting     2007   ENDARTERECTOMY Right 12/14/2022   Procedure: ENDARTERECTOMY RIGHT CAROTID;  Surgeon: Serene Gaile ORN, MD;   Location: [facility] OR;  Service: Vascular;  Laterality: Right;   EYE SURGERY Right    cataract removed   FEMORAL-POPLITEAL BYPASS GRAFT Left 10/29/2013   Procedure: BYPASS GRAFT FEMORAL-POPLITEAL ARTERY;  Surgeon: Gaile ORN Serene, MD;  Location: [facility] OR;  Service: Vascular;  Laterality: Left;  Left Femoral to Posterior Tibial artery bypass graft using nonreversed saphenous vein. and thrombectomy of Tibial arterly.   FEMORAL-TIBIAL BYPASS GRAFT Left 10/30/2013   Procedure: Reexploration of Femoral-Posterior Tibial Bypass; Thrombectomy of Bypass; Left Leg Angiogram; Posterior Tibial Vein Patch Angioplasty;  Surgeon: Gaile ORN Serene, MD;  Location: [facility] OR;  Service: Vascular;  Laterality: Left;   LOWER EXTREMITY ANGIOGRAM N/A 05/25/2014   Procedure: LOWER EXTREMITY ANGIOGRAM;  Surgeon: Gaile ORN Serene, MD;  Location: [facility] CATH LAB;  Service: Cardiovascular;  Laterality: N/A;   PATCH ANGIOPLASTY Right 12/14/2022   Procedure: PATCH ANGIOPLASTY USING XENOSURE 1CM X 6CM PATCH;  Surgeon: Serene Gaile ORN, MD;  Location: [facility] OR;  Service: Vascular;  Laterality: Right;   PERIPHERAL VASCULAR BALLOON ANGIOPLASTY Left 10/22/2017   Procedure: PERIPHERAL VASCULAR BALLOON ANGIOPLASTY;  Surgeon: Serene Gaile ORN, MD;  [facility];  Service: Cardiovascular;  Laterality: Left;  fem pop bypass   PERIPHERAL VASCULAR CATHETERIZATION Left 10/04/2015   Procedure: Lower Extremity Angiography;  Surgeon: Gaile ORN Serene, MD;  Location: [facility] INVASIVE CV LAB;  Service: Cardiovascular;  Laterality: Left;   PERIPHERAL VASCULAR CATHETERIZATION Left 10/04/2015   Procedure: Peripheral Vascular  Intervention;  Surgeon: Gaile LELON New, MD;  Location: [facility] INVASIVE CV LAB;  Service: Cardiovascular;  Laterality: Left;   POPLITEAL ARTERY STENT  11/12/2009   left popliteal artery    RETINAL DETACHMENT SURGERY Right 02/12/2009   TOOTH EXTRACTION  07/13/2013   Wisdom Tooth Extraction    Home Medications:  Allergies as of 09/27/2023        Reactions   Omnipaque  [iohexol ] Rash, Other (See Comments)   Pt requires full premeds and needs to bring a driver.  Broke out in rash on back and arms.  Ok w/ premeds   Oxytetracycline Rash, Other (See Comments)   Terramycin causes rash   Alteplase  Other (See Comments)   Internal bleeding, per RN in short stay. Patient requested this to be added to his allergy list        Medication List        Accurate as of September 27, 2023  9:00 AM. If you have any questions, ask your nurse or doctor.          aspirin  EC 81 MG tablet Take 1 tablet (81 mg total) by mouth daily. Swallow whole.   atorvastatin  40 MG tablet Commonly known as: LIPITOR TAKE 1 TABLET BY MOUTH EVERY DAY   Co Q-10 100 MG Caps Take 100 capsules by mouth daily.   fenofibrate  54 MG tablet TAKE 1 TABLET BY MOUTH DAILY   Fish Oil  Triple Strength 1400 MG Caps Take 2,800 mg by mouth 2 (two) times daily.   metoprolol  succinate 25 MG 24 hr tablet Commonly known as: TOPROL -XL TAKE 1 TABLET (25 MG TOTAL) BY MOUTH DAILY.   multivitamin with minerals Tabs tablet Take 1 tablet by mouth daily.   Saw Palmetto  450 MG Caps Take 2 capsules (900 mg total) by mouth 2 (two) times daily.   vitamin C 1000 MG tablet Take 500 mg by mouth daily.   Vitamin D3 50 MCG (2000 UT) capsule Take 2,000 Units by mouth daily.   warfarin 5 MG tablet Commonly known as: COUMADIN  Take as directed by the anticoagulation clinic. If you are unsure how to take this medication, talk to your nurse or doctor. Original instructions: Take 1.5 tablets (7.5 mg total) by mouth daily at 4 PM.   Zinc Acetate 25 MG Caps Take 25 mg by mouth daily.        Allergies:  Allergies  Allergen Reactions   Omnipaque  [Iohexol ] Rash and Other (See Comments)    Pt requires full premeds and needs to bring a driver.  Broke out in rash on back and arms.  Ok w/ premeds   Oxytetracycline Rash and Other (See Comments)    Terramycin causes rash    Alteplase  Other (See Comments)    Internal bleeding, per RN in short stay. Patient requested this to be added to his allergy list    Family History: Family History  Problem Relation Age of Onset   Aneurysm Sister        possibly    Peripheral vascular disease Brother     Social History:  reports that he quit smoking about 38 years ago. His smoking use included cigarettes. He started smoking about 53 years ago. He has never used smokeless tobacco. He reports that he does not currently use alcohol. He reports that he does not use drugs.   Physical Exam: BP 130/73 (BP Location: Left Arm, Patient Position: Sitting, Cuff Size: Large)   Pulse (!) 55   Ht 6' 2 (1.88 m)  Wt 199 lb 1.6 oz (90.3 kg)   BMI 25.56 kg/m   Constitutional:  Alert and oriented, No acute distress. HEENT: [place] AT Respiratory: Normal respiratory effort, no increased work of breathing. Psychiatric: Normal mood and affect.   Assessment & Plan:    1.  Elevated PSA Stable PSA Prostate volume MRI >200 mL We again discussed 5-ARI based on prostate size; desires to continue surveillance 1 year follow-up with PSA   [name] JAYSON Barba, MD  [facility] [address], [address] [place], [place] [address] [phone]

## 2023-10-15 ENCOUNTER — Other Ambulatory Visit: Payer: Self-pay | Admitting: Family Medicine

## 2023-10-15 DIAGNOSIS — D6869 Other thrombophilia: Secondary | ICD-10-CM

## 2023-10-15 DIAGNOSIS — I739 Peripheral vascular disease, unspecified: Secondary | ICD-10-CM

## 2023-10-15 NOTE — Telephone Encounter (Signed)
 Requested medication (s) are due for refill today - unsure  Requested medication (s) are on the active medication list -yes  Future visit scheduled -yes  Last refill: 02/14/23  Notes to clinic: requires provider review , missing lab  Requested Prescriptions  Pending Prescriptions Disp Refills   warfarin (COUMADIN ) 5 MG tablet      Sig: Take 1.5 tablets (7.5 mg total) by mouth daily at 4 PM.     Hematology:  Anticoagulants - warfarin Failed - 10/15/2023  4:27 PM      Failed - Manual Review: If patient's warfarin is managed by Anti-Coag team, route request to them. If not, route request to the provider.      Failed - INR in normal range and within 30 days    POC INR  Date Value Ref Range Status  09/04/2023 2.6  Final   INR  Date Value Ref Range Status  09/18/2023 3.1 (A) 2.0 - 3.0 Final         Passed - HCT in normal range and within 360 days    Hematocrit  Date Value Ref Range Status  02/14/2023 45.6 37.5 - 51.0 % Final         Passed - Patient is not pregnant      Passed - Valid encounter within last 3 months    Recent Outpatient Visits           3 weeks ago PAD (peripheral artery disease) (HCC)   [facility] Pardue, Lauraine SAILOR, DO       Future Appointments             In 4 months Pardue, Lauraine SAILOR, DO [facility] [facility] [place]   In 11 months Stoioff, Glendia BROCKS, MD [facility] [place]               Requested Prescriptions  Pending Prescriptions Disp Refills   warfarin (COUMADIN ) 5 MG tablet      Sig: Take 1.5 tablets (7.5 mg total) by mouth daily at 4 PM.     Hematology:  Anticoagulants - warfarin Failed - 10/15/2023  4:27 PM      Failed - Manual Review: If patient's warfarin is managed by Anti-Coag team, route request to them. If not, route request to the provider.      Failed - INR in normal range and within 30 days    POC INR  Date Value Ref Range Status  09/04/2023 2.6  Final   INR  Date  Value Ref Range Status  09/18/2023 3.1 (A) 2.0 - 3.0 Final         Passed - HCT in normal range and within 360 days    Hematocrit  Date Value Ref Range Status  02/14/2023 45.6 37.5 - 51.0 % Final         Passed - Patient is not pregnant      Passed - Valid encounter within last 3 months    Recent Outpatient Visits           3 weeks ago PAD (peripheral artery disease) (HCC)   [facility] Pardue, Lauraine SAILOR, DO       Future Appointments             In 4 months Pardue, Lauraine SAILOR, DO [place] [facility] [place]   In 11 months Stoioff, Glendia BROCKS, MD [facility] [place]

## 2023-10-15 NOTE — Telephone Encounter (Unsigned)
 Copied from CRM [id number]. Topic: Clinical - Medication Refill >> Oct 15, 2023  9:46 AM Myrick T wrote: Patient got enough meds from pharmacy to get his through the holiday. Will take his last pill today.  Medication: warfarin (COUMADIN ) 5 MG tablet  Has the patient contacted their pharmacy? Yes  This is the patient's preferred pharmacy:  [facility] [id number], [place] - [address] [place] [address] Phone: [phone] Fax: [phone]  Is this the correct pharmacy for this prescription? Yes  Has the prescription been filled recently? Yes  Is the patient out of the medication? Yes  Has the patient been seen for an appointment in the last year OR does the patient have an upcoming appointment? Yes  Can we respond through MyChart? Yes  Agent: Please be advised that Rx refills may take up to 3 business days. We ask that you follow-up with your pharmacy.

## 2023-10-16 ENCOUNTER — Ambulatory Visit

## 2023-10-16 DIAGNOSIS — I739 Peripheral vascular disease, unspecified: Secondary | ICD-10-CM | POA: Diagnosis not present

## 2023-10-16 LAB — POCT INR
INR: 3.2 — AB (ref 2.0–3.0)
POC INR: 3.2
PT: 37.9

## 2023-10-16 MED ORDER — WARFARIN SODIUM 5 MG PO TABS: 7.5000 mg | ORAL_TABLET | Freq: Every day | ORAL | 1 refills | Status: DC

## 2023-10-16 NOTE — Patient Instructions (Signed)
 Description   Continue 7.5 mg daily.  Appointment in 4 weeks

## 2023-10-28 ENCOUNTER — Ambulatory Visit (HOSPITAL_BASED_OUTPATIENT_CLINIC_OR_DEPARTMENT_OTHER)
Admission: RE | Admit: 2023-10-28 | Discharge: 2023-10-28 | Disposition: A | Discharge status: Home / Self Care | Source: Ambulatory Visit | Attending: Surgery | Admitting: Surgery

## 2023-10-28 ENCOUNTER — Ambulatory Visit (HOSPITAL_COMMUNITY)
Admission: RE | Admit: 2023-10-28 | Discharge: 2023-10-28 | Disposition: A | Discharge status: Home / Self Care | Source: Ambulatory Visit | Attending: Surgery | Admitting: Surgery

## 2023-10-28 ENCOUNTER — Ambulatory Visit: Admitting: Surgery

## 2023-10-28 ENCOUNTER — Encounter: Payer: Self-pay | Admitting: Surgery

## 2023-10-28 ENCOUNTER — Ambulatory Visit: Attending: Surgery | Admitting: Surgery

## 2023-10-28 VITALS — BP 132/76 | HR 50 | Temp 98.0°F | Ht 74.0 in | Wt 208.0 lb

## 2023-10-28 DIAGNOSIS — I739 Peripheral vascular disease, unspecified: Secondary | ICD-10-CM | POA: Insufficient documentation

## 2023-10-28 DIAGNOSIS — I6523 Occlusion and stenosis of bilateral carotid arteries: Secondary | ICD-10-CM

## 2023-10-28 DIAGNOSIS — I714 Abdominal aortic aneurysm, without rupture, unspecified: Secondary | ICD-10-CM | POA: Diagnosis not present

## 2023-10-28 DIAGNOSIS — I7143 Infrarenal abdominal aortic aneurysm, without rupture: Secondary | ICD-10-CM | POA: Diagnosis not present

## 2023-10-28 DIAGNOSIS — I724 Aneurysm of artery of lower extremity: Secondary | ICD-10-CM | POA: Diagnosis not present

## 2023-10-28 LAB — VAS US ABI WITH/WO TBI
Left ABI: 0.56
Right ABI: 0.91

## 2023-10-28 NOTE — Progress Notes (Signed)
 [facility]  Patient name: [name] [name] MRN: [id number] DOB: [birth year] Sex: male   REASON FOR VISIT:    Follow up  [name] [name] is a [name] y.o. male who is back today for follow-up.  He has undergone the following procedures:               -October 2011: Endovascular aneurysm repair, with known type II endoleak             -October 2011: Endovascular repair of left popliteal aneurysm             -October 29, 2013: Femoral posterior tibial bypass graft: This was performed after failed attempted lytic therapy for occluded popliteal stents.  The bypass graft occluded early requiring reexploration.  The posterior tibial artery was opened behind the ankle and Fogarty catheters were able to be passed around the pedal arch.             -05/25/2014: Drug-coated balloon angioplasty to the distal anastomosis of his left leg bypass             -10/22/2017: Drug-coated balloon angioplasty, left distal anastomosis of his left leg bypass             -05/22/2022: Drug-coated balloon angioplasty, left femoral-popliteal bypass graft    -12/14/2022:  right CEA (As[facility])   The patient continues to take a statin for hypercholesterolemia.  He is on Coumadin .  He reports no new interval changes   PAST MEDICAL HISTORY:   Past Medical History:  Diagnosis Date   Abdominal aortic aneurysm (HCC)    Cancer (HCC)    skin   Carotid artery occlusion    Chronic kidney disease    Right kidney stone   Coronary artery disease    Detached retina    Hyperlipidemia    Hypertension    Myocardial infarction [facility])    2007 heart stent   Popliteal aneurysm (HCC)      FAMILY HISTORY:   Family History  Problem Relation Age of Onset   Aneurysm Sister        possibly    Peripheral vascular disease Brother     SOCIAL HISTORY:   Social History   Tobacco Use   Smoking status: Former    Current packs/day: 0.00     Types: Cigarettes    Start date: 09/22/1970    Quit date: 09/21/1985    Years since quitting: 38.1   Smokeless tobacco: Never  Substance Use Topics   Alcohol use: Not Currently    Comment: 1 beer ocassionally (mostly in summer)     ALLERGIES:   Allergies  Allergen Reactions   Omnipaque  [Iohexol ] Rash and Other (See Comments)    Pt requires full premeds and needs to bring a driver.  Broke out in rash on back and arms.  Ok w/ premeds   Oxytetracycline Rash and Other (See Comments)    Terramycin causes rash   Alteplase  Other (See Comments)    Internal bleeding, per RN in short stay. Patient requested this to be added to his allergy list     CURRENT MEDICATIONS:   Current Outpatient Medications  Medication Sig Dispense Refill   Ascorbic Acid (VITAMIN C) 1000 MG tablet Take 500 mg by mouth daily.     aspirin  EC 81 MG tablet Take 1 tablet (81 mg total) by mouth daily. Swallow whole.     atorvastatin  (LIPITOR) 40 MG tablet  TAKE 1 TABLET BY MOUTH EVERY DAY 90 tablet 3   Cholecalciferol (VITAMIN D3) 50 MCG (2000 UT) capsule Take 2,000 Units by mouth daily.     Coenzyme Q10 (CO Q-10) 100 MG CAPS Take 100 capsules by mouth daily.     fenofibrate  54 MG tablet TAKE 1 TABLET BY MOUTH DAILY 90 tablet 3   metoprolol  succinate (TOPROL -XL) 25 MG 24 hr tablet TAKE 1 TABLET (25 MG TOTAL) BY MOUTH DAILY. 90 tablet 3   Multiple Vitamin (MULTIVITAMIN WITH MINERALS) TABS tablet Take 1 tablet by mouth daily.     Omega-3 Fatty Acids (FISH OIL  TRIPLE STRENGTH) 1400 MG CAPS Take 2,800 mg by mouth 2 (two) times daily.     Saw Palmetto  450 MG CAPS Take 2 capsules (900 mg total) by mouth 2 (two) times daily.     warfarin (COUMADIN ) 5 MG tablet Take 1.5 tablets (7.5 mg total) by mouth daily at 4 PM. 90 tablet 1   Zinc Acetate 25 MG CAPS Take 25 mg by mouth daily.     No current facility-administered medications for this visit.    REVIEW OF SYSTEMS:   [X]  denotes positive finding, [ ]  denotes negative  finding Cardiac  Comments:  Chest pain or chest pressure:    Shortness of breath upon exertion:    Short of breath when lying flat:    Irregular heart rhythm:        Vascular    Pain in calf, thigh, or hip brought on by ambulation:    Pain in feet at night that wakes you up from your sleep:     Blood clot in your veins:    Leg swelling:         Pulmonary    Oxygen at home:    Productive cough:     Wheezing:         Neurologic    Sudden weakness in arms or legs:     Sudden numbness in arms or legs:     Sudden onset of difficulty speaking or slurred speech:    Temporary loss of vision in one eye:     Problems with dizziness:         Gastrointestinal    Blood in stool:     Vomited blood:         Genitourinary    Burning when urinating:     Blood in urine:        Psychiatric    Major depression:         Hematologic    Bleeding problems:    Problems with blood clotting too easily:        Skin    Rashes or ulcers:        Constitutional    Fever or chills:      PHYSICAL EXAM:   There were no vitals filed for this visit.  GENERAL: The patient is a well-nourished male, in no acute distress. The vital signs are documented above. CARDIAC: There is a regular rate and rhythm.  VASCULAR:  PULMONARY: Non-labored respirations ABDOMEN: Soft and non-tender with normal pitched bowel sounds.  MUSCULOSKELETAL: There are no major deformities or cyanosis. NEUROLOGIC: No focal weakness or paresthesias are detected. SKIN: There are no ulcers or rashes noted. PSYCHIATRIC: The patient has a normal affect.  STUDIES:   I have reviewed the following:  ABI/TBIToday's ABIToday's TBIPrevious ABIPrevious TBI  +-------+-----------+-----------+------------+------------+  Right 0.91       0.33  0.89        0.43          +-------+-----------+-----------+------------+------------+  Left  0.56       0.21       0.67        0.26           +-------+-----------+-----------+------------+------------+  Right: Largely biphasic flow is visualized throughout the right lower  extremity. The peroneal artery is not well visualized, possibly occluded.  Patent two vessel runoff to the foot.   Left: Patent left common femoral artery to posterior tibial artery bypass  graft. There is no evidence of stenosis within the graft.   Carotid: Right Carotid: The extracranial vessels were near-normal with only minimal  wall                thickening or plaque.   Left Carotid: Velocities in the left ICA are consistent with a 1-39%  stenosis.   Vertebrals: Bilateral vertebral arteries demonstrate antegrade flow.  Subclavians: Normal flow hemodynamics were seen in bilateral subclavian               arteries.   EVAR: Abdominal Aorta: Patent EVAR. There is no evidence of endoleak.  IVC/Iliac: There is no evidence of thrombus involving the IVC.  Maximum diameter of 4.7 cm  MEDICAL ISSUES:   Plan for repeat studies of his carotid, abdominal aortic aneurysm, and left leg bypass and 9 months.  I will also need to evaluate his right popliteal aneurysm    Malvina New, IV, MD, FACS [facility] [phone] Pager [phone]

## 2023-11-13 ENCOUNTER — Ambulatory Visit

## 2023-11-13 DIAGNOSIS — I739 Peripheral vascular disease, unspecified: Secondary | ICD-10-CM | POA: Diagnosis not present

## 2023-11-13 LAB — POCT INR
INR: 2.5 (ref 2.0–3.0)
POC INR: 2.5
PT: 30.6

## 2023-11-13 NOTE — Patient Instructions (Signed)
 Description   Continue 7.5 mg daily.  Appointment in 4 weeks

## 2023-12-09 ENCOUNTER — Ambulatory Visit: Payer: Self-pay

## 2023-12-09 NOTE — Telephone Encounter (Signed)
 FYI Only or Action Required?: FYI only for provider.  Patient was last seen in primary care on 09/18/2023 by Donzella Lauraine SAILOR, DO.  Called Nurse Triage reporting Abdominal Pain.  Symptoms began several days ago.  Interventions attempted: Nothing.  Symptoms are: unchanged.  Triage Disposition: See HCP Within 4 Hours (Or PCP Triage)  Patient/caregiver understands and will follow disposition?: Unsure    Copied from CRM #[id number]. Topic: Clinical - Red Word Triage >> Dec 09, 2023 11:06 AM Antony RAMAN wrote: Red Word that prompted transfer to Nurse Triage: pain in lower left side of stomach Reason for Disposition  [1] MILD-MODERATE pain AND [2] constant AND [3] age > 60 years  Answer Assessment - Initial Assessment Questions PT states pain stated about 5 days ago, worse with movement. No other symptoms. Pt states he just thinks he needs a sonogram. Offered appt for tomorrow but due to symptoms and availability, Rn recommended ER or UC. He stated so you're telling me I need to see someone if I want a sonogram. Rn advised ER or UC would evaluated and determine what testing needed to be completed. Pt then asked if he could be sitting at the Er all, Rn advised it could be a possibility.   1. LOCATION: Where does it hurt?      Lower left abdomen 2. RADIATION: Does the pain shoot anywhere else? (e.g., chest, back)     Sometimes does transfer to the back 3. ONSET: When did the pain begin? (Minutes, hours or days ago)      About 5 days ago 4. SUDDEN: Gradual or sudden onset?     gradually 5. PATTERN Does the pain come and go, or is it constant?     constant 6. SEVERITY: How bad is the pain?  (e.g., Scale 1-10; mild, moderate, or severe)     4 7. RECURRENT SYMPTOM: Have you ever had this type of stomach pain before? If Yes, ask: When was the last time? and What happened that time?      no 8. CAUSE: What do you think is causing the stomach pain? (e.g., gallstones, recent  abdominal surgery)     unknown 9. RELIEVING/AGGRAVATING FACTORS: What makes it better or worse? (e.g., antacids, bending or twisting motion, bowel movement)     Laying down feels better, moving makes it worse 10. OTHER SYMPTOMS: Do you have any other symptoms? (e.g., back pain, diarrhea, fever, urination pain, vomiting)       no  Protocols used: Abdominal Pain - Male-A-AH

## 2023-12-11 ENCOUNTER — Ambulatory Visit

## 2023-12-12 ENCOUNTER — Telehealth: Payer: Self-pay

## 2023-12-12 LAB — PROTIME-INR
INR: 2.5 — ABNORMAL HIGH (ref 0.9–1.2)
Prothrombin Time: 25.5 s — ABNORMAL HIGH (ref 9.1–12.0)

## 2023-12-12 NOTE — Telephone Encounter (Signed)
 Copied from CRM [id number]. Topic: Clinical - Lab/Test Results >> Dec 12, 2023  2:00 PM Santiya F wrote: Reason for CRM: Patient is calling in wanting the status of his lab results.

## 2023-12-16 ENCOUNTER — Telehealth: Payer: Self-pay | Admitting: Family Medicine

## 2023-12-16 NOTE — Telephone Encounter (Signed)
 Copied from CRM #[id number]. Topic: Clinical - Lab/Test Results >> Dec 16, 2023  2:00 PM Olam RAMAN wrote: Reason for CRM: pt calling again for lab results. Cb [phone]

## 2023-12-19 ENCOUNTER — Ambulatory Visit: Payer: Self-pay | Admitting: Family Medicine

## 2023-12-27 ENCOUNTER — Telehealth: Payer: Self-pay | Admitting: Family Medicine

## 2023-12-27 NOTE — Telephone Encounter (Signed)
 Patient is coming in on 01/01/24 for his PT in the office.

## 2024-01-01 ENCOUNTER — Ambulatory Visit

## 2024-01-01 DIAGNOSIS — I739 Peripheral vascular disease, unspecified: Secondary | ICD-10-CM

## 2024-01-01 LAB — POCT INR
INR: 2.4 (ref 2.0–3.0)
POC INR: 2.4
PT: 28.8

## 2024-01-01 NOTE — Patient Instructions (Signed)
Description   Continue 7.5 mg daily.  Appointment in 3 weeks

## 2024-01-10 ENCOUNTER — Other Ambulatory Visit: Payer: Self-pay | Admitting: Family Medicine

## 2024-01-10 DIAGNOSIS — I739 Peripheral vascular disease, unspecified: Secondary | ICD-10-CM

## 2024-01-10 DIAGNOSIS — D6869 Other thrombophilia: Secondary | ICD-10-CM

## 2024-01-22 ENCOUNTER — Ambulatory Visit

## 2024-01-22 DIAGNOSIS — I739 Peripheral vascular disease, unspecified: Secondary | ICD-10-CM

## 2024-01-22 LAB — POCT INR
INR: 2.7 (ref 2.0–3.0)
POC INR: 2.7
PT: 32.6

## 2024-01-22 NOTE — Patient Instructions (Signed)
 Description   Continue 7.5 mg daily.  Appointment in 4 weeks

## 2024-02-18 ENCOUNTER — Ambulatory Visit (INDEPENDENT_AMBULATORY_CARE_PROVIDER_SITE_OTHER): Admitting: Family Medicine

## 2024-02-18 ENCOUNTER — Encounter: Payer: Self-pay | Admitting: Family Medicine

## 2024-02-18 VITALS — BP 121/73 | HR 67 | Temp 97.6°F | Ht 74.0 in | Wt 205.3 lb

## 2024-02-18 DIAGNOSIS — R7303 Prediabetes: Secondary | ICD-10-CM | POA: Diagnosis not present

## 2024-02-18 DIAGNOSIS — E782 Mixed hyperlipidemia: Secondary | ICD-10-CM

## 2024-02-18 DIAGNOSIS — Z0000 Encounter for general adult medical examination without abnormal findings: Secondary | ICD-10-CM | POA: Diagnosis not present

## 2024-02-18 DIAGNOSIS — Z7901 Long term (current) use of anticoagulants: Secondary | ICD-10-CM

## 2024-02-18 DIAGNOSIS — R7989 Other specified abnormal findings of blood chemistry: Secondary | ICD-10-CM

## 2024-02-18 DIAGNOSIS — D6869 Other thrombophilia: Secondary | ICD-10-CM

## 2024-02-18 DIAGNOSIS — I10 Essential (primary) hypertension: Secondary | ICD-10-CM

## 2024-02-18 DIAGNOSIS — I739 Peripheral vascular disease, unspecified: Secondary | ICD-10-CM | POA: Diagnosis not present

## 2024-02-18 NOTE — Assessment & Plan Note (Signed)
 Follows with vascular surgery.  On anticoagulation.  No acute concerns.  Continue to monitor. Defer to specialist management.

## 2024-02-18 NOTE — Assessment & Plan Note (Addendum)
 Anticoagulated for peripheral arterial disease.  INR goal 2.5-3.5.  Managed with warfarin, current dose 7.5 mg daily. Regular INR checks every four weeks.  No acute concerns. - Continue warfarin 7.5 mg daily. - Continue regular INR checks every four weeks.

## 2024-02-18 NOTE — Patient Instructions (Signed)
 Recommended vaccine to get at the pharmacy: Tdap (tetanus, diphtheria and pertussis)

## 2024-02-18 NOTE — Progress Notes (Signed)
 100    Complete physical exam   Patient: [name] [name]   DOB: 06-May-1945   79 y.o. Male  MRN: [id number] Visit Date: 02/18/2024  Today's healthcare provider: LAURAINE LOISE BUOY, DO   Chief Complaint  Patient presents with   Annual Exam    Diet- General Exercise- Mostly walking and some house and yard work Overall feeling- Okay Sleep- Okay Concerns- None   Subjective    [name] [name] is a 79 y.o. male who presents today for a complete physical exam.   HPI HPI     Annual Exam    Additional comments: Diet- General Exercise- Mostly walking and some house and yard work Overall feeling- Okay Sleep- Okay Concerns- None      Last edited by Terrel Powell CROME, CMA on 02/18/2024  1:43 PM.       [name] [name] is an 79 year old male who presents for an annual physical exam and routine INR monitoring.  He has no current concerns or symptoms.  He urinates once at night.  He is on a regimen of warfarin, taking 7.5 mg daily, and attends monthly INR checks. He reports no missed doses. He also takes atorvastatin  40 mg, a baby aspirin , CoQ10, metoprolol , and vitamins. He uses fish oil  to manage his triglycerides.  He has a history of a significant leg issue (clotting related to his peripheral arterial disease) where he nearly died in the hospital due to a clot that required intervention. He recalls being 'black and blue' after receiving a fibrinolytic, followed by a tetanus shot at that time. He has experienced numbness in his foot, particularly where he had previous complications, including the loss of two toes, but notes that his sensation has improved at this point.  He stays active with house and yard work. He had a sausage and egg biscuit with orange juice and coffee for breakfast.    Past Medical History:  Diagnosis Date   Abdominal aortic aneurysm    Cancer (HCC)    skin   Carotid artery occlusion    Chronic kidney disease    Right kidney stone   Coronary  artery disease    Detached retina    Hyperlipidemia    Hypertension    Myocardial infarction [facility])    2007 heart stent   Popliteal aneurysm    Past Surgical History:  Procedure Laterality Date   ABDOMINAL AORTIC ANEURYSM REPAIR  11/24/2009   EVAR   ABDOMINAL AORTOGRAM W/LOWER EXTREMITY N/A 10/22/2017   Procedure: ABDOMINAL AORTOGRAM W/LOWER EXTREMITY;  Surgeon: Serene Gaile ORN, MD;  Location: [facility] INVASIVE [facility];  Service: Cardiovascular;  Laterality: N/A;   ABDOMINAL AORTOGRAM W/LOWER EXTREMITY N/A 05/22/2022   Procedure: ABDOMINAL AORTOGRAM W/LOWER EXTREMITY;  Surgeon: Serene Gaile ORN, MD;  Location: [facility];  Service: Cardiovascular;  Laterality: N/A;   AMPUTATION Left 12/10/2013   Procedure: AMPUTATION LEFT  FOURTH AND FIFTH TOES;  Surgeon: Gaile ORN Serene, MD;  Location: [facility] OR;  Service: Vascular;  Laterality: Left;   CAROTID ENDARTERECTOMY     CAROTID ENDARTERECTOMY Right 12/14/2022   COLONOSCOPY     coronary stenting     2007   ENDARTERECTOMY Right 12/14/2022   Procedure: ENDARTERECTOMY RIGHT CAROTID;  Surgeon: Serene Gaile ORN, MD;  Location[facility] OR;  Service: Vascular;  Laterality: Right;   EYE SURGERY Right    cataract removed   FEMORAL-POPLITEAL BYPASS GRAFT Left 10/29/2013   Procedure: BYPASS GRAFT FEMORAL-POPLITEAL ARTERY;  Surgeon: Gaile ORN  Serene, MD;  Location: [facility] OR;  Service: Vascular;  Laterality: Left;  Left Femoral to Posterior Tibial artery bypass graft using nonreversed saphenous vein. and thrombectomy of Tibial arterly.   FEMORAL-TIBIAL BYPASS GRAFT Left 10/30/2013   Procedure: Reexploration of Femoral-Posterior Tibial Bypass; Thrombectomy of Bypass; Left Leg Angiogram; Posterior Tibial Vein Patch Angioplasty;  Surgeon: Gaile LELON Serene, MD;  Location: [facility] OR;  Service: Vascular;  Laterality: Left;   LOWER EXTREMITY ANGIOGRAM N/A 05/25/2014   Procedure: LOWER EXTREMITY ANGIOGRAM;  Surgeon: Gaile LELON Serene, MD;  Location: [facility] CATH LAB;  Service:  Cardiovascular;  Laterality: N/A;   PATCH ANGIOPLASTY Right 12/14/2022   Procedure: PATCH ANGIOPLASTY USING XENOSURE 1CM X 6CM PATCH;  Surgeon: Serene Gaile LELON, MD;  Location: [facility] OR;  Service: Vascular;  Laterality: Right;   PERIPHERAL VASCULAR BALLOON ANGIOPLASTY Left 10/22/2017   Procedure: PERIPHERAL VASCULAR BALLOON ANGIOPLASTY;  Surgeon: Serene Gaile LELON, MD;  Location: [facility];  Service: Cardiovascular;  Laterality: Left;  fem pop bypass   PERIPHERAL VASCULAR CATHETERIZATION Left 10/04/2015   Procedure: Lower Extremity Angiography;  Surgeon: Gaile LELON Serene, MD;  Location: [facility] INVASIVE CV LAB;  Service: Cardiovascular;  Laterality: Left;   PERIPHERAL VASCULAR CATHETERIZATION Left 10/04/2015   Procedure: Peripheral Vascular Intervention;  Surgeon: Gaile LELON Serene, MD;  Location: [facility];  Service: Cardiovascular;  Laterality: Left;   POPLITEAL ARTERY STENT  11/12/2009   left popliteal artery    RETINAL DETACHMENT SURGERY Right 02/12/2009   TOOTH EXTRACTION  07/13/2013   Wisdom Tooth Extraction   Social History   Socioeconomic History   Marital status: Married    Spouse name: Not on file   Number of children: 1   Years of education: Not on file   Highest education level: Associate degree: occupational, scientist, product/process development, or vocational program  Occupational History   Occupation: Retired  Tobacco Use   Smoking status: Former    Current packs/day: 0.00    Types: Cigarettes    Start date: 09/22/1970    Quit date: 09/21/1985    Years since quitting: 38.4   Smokeless tobacco: Never  Vaping Use   Vaping status: Never Used  Substance and Sexual Activity   Alcohol use: Not Currently    Comment: 1 beer ocassionally (mostly in summer)   Drug use: No   Sexual activity: Not on file  Other Topics Concern   Not on file  Social History Narrative   Lives with family.   Social Drivers of Health   Tobacco Use: Medium Risk (02/18/2024)   Patient History    Smoking Tobacco  Use: Former    Smokeless Tobacco Use: Never    Passive Exposure: Not on file  Financial Resource Strain: Low Risk  (12/09/2023)   Received from [facility]   Overall Financial Resource Strain (CARDIA)    Difficulty of Paying Living Expenses: Not hard at all  Food Insecurity: No Food Insecurity (12/09/2023)   Received from [facility]   Epic    Within the past 12 months, you worried that your food would run out before you got the money to buy more.: Never true    Within the past 12 months, the food you bought just didn't last and you didn't have money to get more.: Never true  Transportation Needs: No Transportation Needs (12/09/2023)   Received from [facility] - Transportation    In the past 12 months, has lack of transportation kept  you from medical appointments or from getting medications?: No    Lack of Transportation (Non-Medical): No  Physical Activity: Sufficiently Active (03/19/2023)   Exercise Vital Sign    Days of Exercise per Week: 7 days    Minutes of Exercise per Session: 30 min  Stress: No Stress Concern Present (03/19/2023)   [employer] - Occupational Stress Questionnaire    Feeling of Stress : Not at all  Social Connections: Unknown (03/19/2023)   Social Connection and Isolation Panel    Frequency of Communication with Friends and Family: Once a week    Frequency of Social Gatherings with Friends and Family: Not on file    Attends Religious Services: 1 to 4 times per year    Active Member of Clubs or Organizations: No    Attends Banker Meetings: Never    Marital Status: Married  Catering Manager Violence: Not At Risk (03/19/2023)   Humiliation, Afraid, Rape, and Kick questionnaire    Fear of Current or Ex-Partner: No    Emotionally Abused: No    Physically Abused: No    Sexually Abused: No  Depression (PHQ2-9): Low Risk (09/18/2023)   Depression (PHQ2-9)    PHQ-2  Score: 0  Alcohol Screen: Low Risk (03/19/2023)   Alcohol Screen    Last Alcohol Screening Score (AUDIT): 0  Housing: Low Risk  (12/09/2023)   Received from [facility]   Epic    In the last 12 months, was there a time when you were not able to pay the mortgage or rent on time?: No    In the past 12 months, how many times have you moved where you were living?: 0    At any time in the past 12 months, were you homeless or living in a shelter (including now)?: No  Utilities: Not At Risk (12/09/2023)   Received from [facility]   Epic    In the past 12 months has the electric, gas, oil, or water company threatened to shut off services in your home?: No  Health Literacy: Adequate Health Literacy (03/19/2023)   B1300 Health Literacy    Frequency of need for help with medical instructions: Never   Family Status  Relation Name Status   Mother  Deceased   Father  Deceased   Sister  (Not Specified)   Brother  (Not Specified)  No partnership data on file   Family History  Problem Relation Age of Onset   Aneurysm Sister        possibly    Peripheral vascular disease Brother    Allergies[1]  Patient Care Team: Adreanna Fickel, Lauraine SAILOR, DO as PCP - General (Family Medicine) Wonda Sharper, MD as PCP - Cardiology (Cardiology) Serene Gaile ORN, MD as Consulting Physician (Vascular Surgery) Pa, [facility]   Medications: Show/hide medication list[2]  Review of Systems  Constitutional:  Negative for appetite change, chills, fatigue and fever.  HENT:  Negative for congestion, ear pain, hearing loss, nosebleeds and trouble swallowing.   Eyes:  Negative for pain and visual disturbance.  Respiratory:  Negative for cough, chest tightness and shortness of breath.   Cardiovascular:  Negative for chest pain, palpitations and leg swelling.  Gastrointestinal:  Negative for abdominal pain, blood in stool, constipation, diarrhea, nausea and vomiting.  Endocrine:  Negative for polydipsia, polyphagia and polyuria.  Genitourinary:  Negative for dysuria and flank pain.  Musculoskeletal:  Negative for arthralgias, back pain, joint swelling, myalgias and  neck stiffness.  Skin:  Negative for color change, rash and wound.  Neurological:  Negative for dizziness, tremors, seizures, speech difficulty, weakness, light-headedness and headaches.  Psychiatric/Behavioral:  Negative for behavioral problems, confusion, decreased concentration, dysphoric mood and sleep disturbance. The patient is not nervous/anxious.   All other systems reviewed and are negative.      Objective    BP 121/73 (BP Location: Left Arm, Patient Position: Sitting, Cuff Size: Normal)   Pulse 67   Temp 97.6 F (36.4 C) (Oral)   Ht 6' 2 (1.88 m)   Wt 205 lb 4.8 oz (93.1 kg)   SpO2 97%   BMI 26.36 kg/m    Physical Exam Vitals and nursing note reviewed.  Constitutional:      General: He is awake.     Appearance: Normal appearance.  HENT:     Head: Normocephalic and atraumatic.     Right Ear: Tympanic membrane, ear canal and external ear normal.     Left Ear: Tympanic membrane, ear canal and external ear normal.     Nose: Nose normal.     Mouth/Throat:     Mouth: Mucous membranes are moist.     Pharynx: Oropharynx is clear. No oropharyngeal exudate or posterior oropharyngeal erythema.  Eyes:     General: No scleral icterus.    Extraocular Movements: Extraocular movements intact.     Conjunctiva/sclera: Conjunctivae normal.     Pupils: Pupils are equal, round, and reactive to light.  Neck:     Thyroid : No thyromegaly or thyroid  tenderness.  Cardiovascular:     Rate and Rhythm: Normal rate and regular rhythm.     Pulses: Normal pulses.     Heart sounds: Normal heart sounds.  Pulmonary:     Effort: Pulmonary effort is normal. No tachypnea, bradypnea or respiratory distress.     Breath sounds: Normal breath sounds. No stridor. No wheezing, rhonchi or rales.  Abdominal:      General: Bowel sounds are normal. There is no distension.     Palpations: Abdomen is soft. There is no mass.     Tenderness: There is no abdominal tenderness. There is no guarding.     Hernia: No hernia is present.  Musculoskeletal:     Cervical back: Normal range of motion and neck supple.     Right lower leg: No edema.     Left lower leg: No edema.  Lymphadenopathy:     Cervical: No cervical adenopathy.  Skin:    General: Skin is warm and dry.     Comments: Scattered seborrheic dermatoses dispersed across body.  Neurological:     Mental Status: He is alert and oriented to person, place, and time. Mental status is at baseline.  Psychiatric:        Mood and Affect: Mood normal.        Behavior: Behavior normal.      Last depression screening scores    09/18/2023    3:47 PM 03/19/2023    2:34 PM 06/07/2022    2:34 PM  PHQ 2/9 Scores  PHQ - 2 Score 0 0 0  PHQ- 9 Score  0  0      Data saved with a previous flowsheet row definition   Last fall risk screening    09/18/2023    3:47 PM  Fall Risk   Falls in the past year? 0  Number falls in past yr: 0  Injury with Fall? 0   Risk for fall due to :  No Fall Risks  Follow up Falls evaluation completed     Data saved with a previous flowsheet row definition   Last Audit-C alcohol use screening    03/19/2023    2:33 PM  Alcohol Use Disorder Test (AUDIT)  1. How often do you have a drink containing alcohol? 0  2. How many drinks containing alcohol do you have on a typical day when you are drinking? 0  3. How often do you have six or more drinks on one occasion? 0  AUDIT-C Score 0   A score of 3 or more in women, and 4 or more in men indicates increased risk for alcohol abuse, EXCEPT if all of the points are from question 1   No results found for any visits on 02/18/24.  Assessment & Plan    Routine Health Maintenance and Physical Exam  Exercise Activities and Dietary recommendations  Goals      DIET - EAT MORE FRUITS AND  VEGETABLES     DIET - INCREASE WATER INTAKE     Recommend increasing water intake to 4-6 glasses a day.        Immunization History  Administered Date(s) Administered   Fluad Quad(high Dose 65+) 02/03/2019, 02/10/2020   INFLUENZA, HIGH DOSE SEASONAL PF 01/14/2015, 11/25/2015, 11/30/2016, 12/06/2017   Moderna Sars-Covid-2 Vaccination 03/28/2019, 04/25/2019   Pneumococcal Conjugate-13 01/25/2017   Pneumococcal Polysaccharide-23 01/28/2018   Tdap 12/17/2013    Health Maintenance  Topic Date Due   Medicare Annual Wellness (AWV)  03/18/2024   Influenza Vaccine  05/12/2024 (Originally 09/13/2023)   Zoster Vaccines- Shingrix (1 of 2) 09/17/2024 (Originally 05/15/1993)   COVID-19 Vaccine (3 - 2025-26 season) 10/12/2024 (Originally 10/14/2023)   DTaP/Tdap/Td (2 - Td or Tdap) 02/17/2025 (Originally 12/18/2023)   Pneumococcal Vaccine: 50+ Years  Completed   Meningococcal B Vaccine  Aged Out   Hepatitis C Screening  Discontinued   Fecal DNA (Cologuard)  Discontinued    Discussed health benefits of physical activity, and encouraged him to engage in regular exercise appropriate for his age and condition.   Annual physical exam  Hypercoagulable state, secondary Assessment & Plan: Anticoagulated for peripheral arterial disease.  INR goal 2.5-3.5.  Managed with warfarin, current dose 7.5 mg daily. Regular INR checks every four weeks.  No acute concerns. - Continue warfarin 7.5 mg daily. - Continue regular INR checks every four weeks.  Orders: -     CBC with Differential/Platelet -     Protime-INR  Anticoagulation monitoring, INR range 2.5-3.5 -     CBC with Differential/Platelet -     Protime-INR  Essential hypertension -     Comprehensive metabolic panel with GFR  Mixed hyperlipidemia -     Comprehensive metabolic panel with GFR -     Lipid Panel With LDL/HDL Ratio  Prediabetes -     Comprehensive metabolic panel with GFR -     Hemoglobin A1c  Elevated TSH -     TSH  PAD  (peripheral artery disease) Assessment & Plan: Follows with vascular surgery.  On anticoagulation.  No acute concerns.  Continue to monitor. Defer to specialist management.       Annual physical exam Physical exam overall unremarkable except as noted above. Routine lab work ordered as noted; patient will be returning for fasting blood work.  Due for tetanus booster. No recent eye exam. Dermatology follow-up scheduled. - Recommended tetanus booster at pharmacy. - Recommended annual eye exam. - Continue dermatology follow-up as scheduled.  Mixed hyperlipidemia  Managed with atorvastatin  40 mg daily. Lipid levels monitored with dietary adjustments and fish oil  for triglycerides. - Continue atorvastatin  40 mg daily.  Essential hypertension Chronic, stable.  Well-controlled on metoprolol  succinate 25 mg daily.  No changes today.  Elevated TSH Recurrent mildly elevated TSH with no symptoms and no treatment needed to date.    Return in about 1 year (around 02/17/2025) for TOC/CPE w/next provider.     I discussed the assessment and treatment plan with the patient  The patient was provided an opportunity to ask questions and all were answered. The patient agreed with the plan and demonstrated an understanding of the instructions.   The patient was advised to call back or seek an in-person evaluation if the symptoms worsen or if the condition fails to improve as anticipated.    LAURAINE LOISE BUOY, DO  [facility] [phone] (phone) [phone] (fax)  [facility]    [1]  Allergies Allergen Reactions   Omnipaque  [Iohexol ] Rash and Other (See Comments)    Pt requires full premeds and needs to bring a driver.  Broke out in rash on back and arms.  Ok w/ premeds   Oxytetracycline Rash and Other (See Comments)    Terramycin causes rash   Alteplase  Other (See Comments)    Internal bleeding, per RN in short stay. Patient requested this to be added  to his allergy list  [2]  Outpatient Medications Prior to Visit  Medication Sig   Ascorbic Acid (VITAMIN C) 1000 MG tablet Take 500 mg by mouth daily.   aspirin  EC 81 MG tablet Take 1 tablet (81 mg total) by mouth daily. Swallow whole.   atorvastatin  (LIPITOR) 40 MG tablet TAKE 1 TABLET BY MOUTH EVERY DAY   Cholecalciferol (VITAMIN D3) 50 MCG (2000 UT) capsule Take 2,000 Units by mouth daily.   Coenzyme Q10 (CO Q-10) 100 MG CAPS Take 100 capsules by mouth daily.   fenofibrate  54 MG tablet TAKE 1 TABLET BY MOUTH DAILY   metoprolol  succinate (TOPROL -XL) 25 MG 24 hr tablet TAKE 1 TABLET (25 MG TOTAL) BY MOUTH DAILY.   Multiple Vitamin (MULTIVITAMIN WITH MINERALS) TABS tablet Take 1 tablet by mouth daily.   Omega-3 Fatty Acids (FISH OIL  TRIPLE STRENGTH) 1400 MG CAPS Take 2,800 mg by mouth 2 (two) times daily.   Saw Palmetto  450 MG CAPS Take 2 capsules (900 mg total) by mouth 2 (two) times daily.   warfarin (COUMADIN ) 5 MG tablet TAKE 1 & 1/2 TABLETS (7.5 MG TOTAL) BY MOUTH DAILY AT 4 PM.   Zinc Acetate 25 MG CAPS Take 25 mg by mouth daily.   No facility-administered medications prior to visit.

## 2024-02-19 ENCOUNTER — Ambulatory Visit

## 2024-02-20 LAB — COMPREHENSIVE METABOLIC PANEL WITH GFR
ALT: 25 IU/L (ref 0–44)
AST: 32 IU/L (ref 0–40)
Albumin: 4.4 g/dL (ref 3.8–4.8)
Alkaline Phosphatase: 80 IU/L (ref 47–123)
BUN/Creatinine Ratio: 11 (ref 10–24)
BUN: 14 mg/dL (ref 8–27)
Bilirubin Total: 0.8 mg/dL (ref 0.0–1.2)
CO2: 23 mmol/L (ref 20–29)
Calcium: 9.7 mg/dL (ref 8.6–10.2)
Chloride: 108 mmol/L — ABNORMAL HIGH (ref 96–106)
Creatinine, Ser: 1.3 mg/dL — ABNORMAL HIGH (ref 0.76–1.27)
Globulin, Total: 2.8 g/dL (ref 1.5–4.5)
Glucose: 115 mg/dL — ABNORMAL HIGH (ref 70–99)
Potassium: 4.4 mmol/L (ref 3.5–5.2)
Sodium: 142 mmol/L (ref 134–144)
Total Protein: 7.2 g/dL (ref 6.0–8.5)
eGFR: 56 mL/min/1.73 — ABNORMAL LOW

## 2024-02-20 LAB — CBC WITH DIFFERENTIAL/PLATELET
Basophils Absolute: 0.1 x10E3/uL (ref 0.0–0.2)
Basos: 1 %
EOS (ABSOLUTE): 0.5 x10E3/uL — ABNORMAL HIGH (ref 0.0–0.4)
Eos: 7 %
Hematocrit: 47.7 % (ref 37.5–51.0)
Hemoglobin: 16.1 g/dL (ref 13.0–17.7)
Immature Grans (Abs): 0 x10E3/uL (ref 0.0–0.1)
Immature Granulocytes: 0 %
Lymphocytes Absolute: 1.9 x10E3/uL (ref 0.7–3.1)
Lymphs: 26 %
MCH: 31.1 pg (ref 26.6–33.0)
MCHC: 33.8 g/dL (ref 31.5–35.7)
MCV: 92 fL (ref 79–97)
Monocytes Absolute: 0.6 x10E3/uL (ref 0.1–0.9)
Monocytes: 8 %
Neutrophils Absolute: 4.3 x10E3/uL (ref 1.4–7.0)
Neutrophils: 58 %
Platelets: 284 x10E3/uL (ref 150–450)
RBC: 5.17 x10E6/uL (ref 4.14–5.80)
RDW: 13.8 % (ref 11.6–15.4)
WBC: 7.3 x10E3/uL (ref 3.4–10.8)

## 2024-02-20 LAB — LIPID PANEL WITH LDL/HDL RATIO
Cholesterol, Total: 150 mg/dL (ref 100–199)
HDL: 32 mg/dL — ABNORMAL LOW
LDL Chol Calc (NIH): 92 mg/dL (ref 0–99)
LDL/HDL Ratio: 2.9 ratio (ref 0.0–3.6)
Triglycerides: 145 mg/dL (ref 0–149)
VLDL Cholesterol Cal: 26 mg/dL (ref 5–40)

## 2024-02-20 LAB — HEMOGLOBIN A1C
Est. average glucose Bld gHb Est-mCnc: 123 mg/dL
Hgb A1c MFr Bld: 5.9 % — ABNORMAL HIGH (ref 4.8–5.6)

## 2024-02-20 LAB — PROTIME-INR
INR: 2.4 — ABNORMAL HIGH (ref 0.9–1.2)
Prothrombin Time: 24.8 s — ABNORMAL HIGH (ref 9.1–12.0)

## 2024-02-20 LAB — TSH: TSH: 6.39 u[IU]/mL — ABNORMAL HIGH (ref 0.450–4.500)

## 2024-03-03 ENCOUNTER — Telehealth: Payer: Self-pay

## 2024-03-03 NOTE — Telephone Encounter (Signed)
 Copied from CRM [id number]. Topic: Clinical - Lab/Test Results >> Mar 02, 2024  4:00 PM Hadassah PARAS wrote: Reason for CRM: Pt is calling to go over lab results. Please advise pt on # [phone]

## 2024-03-11 ENCOUNTER — Ambulatory Visit: Payer: Self-pay | Admitting: Family Medicine

## 2024-03-18 ENCOUNTER — Ambulatory Visit (INDEPENDENT_AMBULATORY_CARE_PROVIDER_SITE_OTHER)

## 2024-03-18 DIAGNOSIS — I739 Peripheral vascular disease, unspecified: Secondary | ICD-10-CM

## 2024-03-18 LAB — POCT INR
INR: 2.9 (ref 2.0–3.0)
POC INR: 2.9
PT: 34.4

## 2024-03-18 NOTE — Patient Instructions (Signed)
 Description   Continue 7.5 mg daily.  Appointment in 4 weeks

## 2024-03-19 ENCOUNTER — Ambulatory Visit: Admitting: Cardiovascular Disease

## 2024-03-19 ENCOUNTER — Encounter: Payer: Self-pay | Admitting: Cardiovascular Disease

## 2024-03-19 VITALS — BP 134/79 | HR 68 | Ht 74.0 in | Wt 207.8 lb

## 2024-03-19 DIAGNOSIS — I10 Essential (primary) hypertension: Secondary | ICD-10-CM

## 2024-03-19 DIAGNOSIS — I2510 Atherosclerotic heart disease of native coronary artery without angina pectoris: Secondary | ICD-10-CM | POA: Diagnosis not present

## 2024-03-19 DIAGNOSIS — E782 Mixed hyperlipidemia: Secondary | ICD-10-CM | POA: Diagnosis not present

## 2024-03-19 DIAGNOSIS — I739 Peripheral vascular disease, unspecified: Secondary | ICD-10-CM | POA: Diagnosis not present

## 2024-03-19 NOTE — Assessment & Plan Note (Signed)
 Treated with fenofibrate  and atorvastatin .  Lipids followed by PCP.  Goal LDL less than 70 mg/dL.

## 2024-03-19 NOTE — Assessment & Plan Note (Signed)
 The patient continues to do well with no symptoms of angina.  He is treated with atorvastatin , aspirin , and a beta-blocker.

## 2024-03-19 NOTE — Assessment & Plan Note (Signed)
 No recent claudication symptoms reported.  Patient followed regularly by vascular surgery.  Treated with aspirin  and warfarin.  He is followed for carotid and lower extremity vascular disease.

## 2024-03-19 NOTE — Patient Instructions (Signed)

## 2024-03-19 NOTE — Assessment & Plan Note (Signed)
 Blood pressure is well-controlled.  No changes made today.  Continue metoprolol  succinate.

## 2024-03-19 NOTE — Progress Notes (Signed)
 " Cardiology Office Note:    Date:  03/19/2024   ID:  [name] [name], DOB 08-23-1945, MRN [id number]  PCP:  Donzella Lauraine SAILOR, DO   [facility] Providers Cardiologist:  Ozell Fell, MD     Referring MD: Donzella Lauraine SAILOR, DO   Chief Complaint  Patient presents with   Coronary Artery Disease    History of Present Illness:    [name] [name] is a 79 y.o. male with a hx of:  Coronary artery disease  S/p inferior STEMI in 2007 >>PCI: BMS to RCA ([facility]) No significant dz in LCx, LAD TTE 10/26/13: Moderate LVH, EF 55-60, no RWMA, GR 1 DD  Abdominal aortic aneurysm, L pop aneurysm - s/p EVAR in 2011 Peripheral arterial disease (followed by Dr. Serene w [facility]) On chronic anticoagulation w warfarin - INR's followed at [facility] Hx of popliteal artery stent in 2011 S/p amputation of L 4&5 toes S/p L fem-pop bypass in 2015 S/p angioplasty of graft in 9/19 Hypertension  Hyperlipidemia  Hx of retinal detachment  Right Carotid endarterectomy 2024  The patient is here alone today. He is doing fairly well. He shoveled some snow this week and tolerated that ok. No chest pain, pressure, or shortness of breath. No lightheadedness. Reports INR's have been steady on long term warfarin. No bleeding problems.   Current Medications: Active Medications[1]   Allergies:   Omnipaque  [iohexol ], Oxytetracycline, and Alteplase    ROS:   Please see the history of present illness.    All other systems reviewed and are negative.  EKGs/Labs/Other Studies Reviewed:    The following studies were reviewed today:     EKG:   EKG Interpretation Date/Time:  Thursday March 19 2024 13:54:06 EST Ventricular Rate:  68 PR Interval:  142 QRS Duration:  96 QT Interval:  362 QTC Calculation: 384 R Axis:   -54  Text Interpretation: Normal sinus rhythm Left anterior fascicular block Inferior infarct (cited on or before 20-Sep-2005) When compared with ECG of 25-Mar-2023 09:31,  No significant change was found Confirmed by Fell Ozell [phone]) on 03/19/2024 2:05:12 PM    Recent Labs: 02/19/2024: ALT 25; BUN 14; Creatinine, Ser 1.30; Hemoglobin 16.1; Platelets 284; Potassium 4.4; Sodium 142; TSH 6.390  Recent Lipid Panel    Component Value Date/Time   CHOL 150 02/19/2024 0954   TRIG 145 02/19/2024 0954   HDL 32 (L) 02/19/2024 0954   CHOLHDL 4.4 02/14/2023 1333   CHOLHDL 4.6 12/15/2022 0302   VLDL 17 12/15/2022 0302   LDLCALC 92 02/19/2024 0954   LDLCALC 73 01/25/2017 1129     Risk Assessment/Calculations:                Physical Exam:    VS:  BP 134/79 (BP Location: Left Arm, Patient Position: Sitting, Cuff Size: Normal)   Pulse 68   Ht 6' 2 (1.88 m)   Wt 207 lb 12.8 oz (94.3 kg)   BMI 26.68 kg/m     Wt Readings from Last 3 Encounters:  03/19/24 207 lb 12.8 oz (94.3 kg)  02/18/24 205 lb 4.8 oz (93.1 kg)  10/28/23 208 lb (94.3 kg)     GEN:  Well nourished, well developed in no acute distress HEENT: Normal NECK: No JVD; No carotid bruits LYMPHATICS: No lymphadenopathy CARDIAC: RRR, no murmurs, rubs, gallops RESPIRATORY:  Clear to auscultation without rales, wheezing or rhonchi  ABDOMEN: Soft, non-tender, non-distended MUSCULOSKELETAL:  No edema; No deformity  SKIN: Warm and dry NEUROLOGIC:  Alert and oriented x 3 PSYCHIATRIC:  Normal affect   Assessment & Plan Essential hypertension Blood pressure is well-controlled.  No changes made today.  Continue metoprolol  succinate. Coronary artery disease involving native coronary artery of native heart without angina pectoris The patient continues to do well with no symptoms of angina.  He is treated with atorvastatin , aspirin , and a beta-blocker. Mixed hyperlipidemia Treated with fenofibrate  and atorvastatin .  Lipids followed by PCP.  Goal LDL less than 70 mg/dL. PAD (peripheral artery disease) No recent claudication symptoms reported.  Patient followed regularly by vascular surgery.  Treated  with aspirin  and warfarin.  He is followed for carotid and lower extremity vascular disease.  The patient appears to be clinically stable from a cardiovascular perspective today.  No medication changes were made.  I will see him back in 1 year for follow-up evaluation.          Medication Adjustments/Labs and Tests Ordered: Current medicines are reviewed at length with the patient today.  Concerns regarding medicines are outlined above.  Orders Placed This Encounter  Procedures   EKG 12-Lead   No orders of the defined types were placed in this encounter.   Patient Instructions  Medication Instructions:  No medication changes were made at this visit. Continue current regimen.   *If you need a refill on your cardiac medications before your next appointment, please call your pharmacy*  Lab Work: None ordered today. If you have labs (blood work) drawn today and your tests are completely normal, you will receive your results only by: MyChart Message (if you have MyChart) OR A paper copy in the mail If you have any lab test that is abnormal or we need to change your treatment, we will call you to review the results.  Testing/Procedures: None ordered today.  Follow-Up: At [facility], you and your health needs are our priority.  As part of our continuing mission to provide you with exceptional heart care, our providers are all part of one team.  This team includes your primary Cardiologist (physician) and Advanced Practice Providers or APPs (Physician Assistants and Nurse Practitioners) who all work together to provide you with the care you need, when you need it.  Your next appointment:   1 year(s)  Provider:   Ozell Fell, MD     Signed, Ozell Fell, MD  03/19/2024 4:50 PM    [facility]     [1]  Current Meds  Medication Sig   Ascorbic Acid (VITAMIN C) 1000 MG tablet Take 500 mg by mouth daily.   aspirin  EC 81 MG tablet Take 1 tablet (81 mg  total) by mouth daily. Swallow whole.   atorvastatin  (LIPITOR) 40 MG tablet TAKE 1 TABLET BY MOUTH EVERY DAY   Cholecalciferol (VITAMIN D3) 50 MCG (2000 UT) capsule Take 2,000 Units by mouth daily.   Coenzyme Q10 (CO Q-10) 100 MG CAPS Take 100 capsules by mouth daily.   fenofibrate  54 MG tablet TAKE 1 TABLET BY MOUTH DAILY   metoprolol  succinate (TOPROL -XL) 25 MG 24 hr tablet TAKE 1 TABLET (25 MG TOTAL) BY MOUTH DAILY.   Multiple Vitamin (MULTIVITAMIN WITH MINERALS) TABS tablet Take 1 tablet by mouth daily.   Omega-3 Fatty Acids (FISH OIL  TRIPLE STRENGTH) 1400 MG CAPS Take 2,800 mg by mouth 2 (two) times daily.   Saw Palmetto  450 MG CAPS Take 2 capsules (900 mg total) by mouth 2 (two) times daily.   warfarin (COUMADIN ) 5 MG tablet TAKE 1 & 1/2  TABLETS (7.5 MG TOTAL) BY MOUTH DAILY AT 4 PM.   Zinc Acetate 25 MG CAPS Take 25 mg by mouth daily.   "

## 2024-03-24 ENCOUNTER — Ambulatory Visit: Payer: Medicare Other

## 2024-04-15 ENCOUNTER — Ambulatory Visit

## 2024-05-01 ENCOUNTER — Ambulatory Visit: Admitting: Physician Assistant

## 2024-09-25 ENCOUNTER — Other Ambulatory Visit

## 2024-09-28 ENCOUNTER — Ambulatory Visit: Admitting: Urology
